# Patient Record
Sex: Male | Born: 1935 | Race: White | Hispanic: No | Marital: Married | Smoking: Former smoker
Health system: Southern US, Community
[De-identification: ages and names within clinical notes are randomized; demographics above are authoritative.]

## PROBLEM LIST (undated history)

## (undated) DIAGNOSIS — K219 Gastro-esophageal reflux disease without esophagitis: Secondary | ICD-10-CM

## (undated) DIAGNOSIS — C801 Malignant (primary) neoplasm, unspecified: Secondary | ICD-10-CM

## (undated) DIAGNOSIS — E039 Hypothyroidism, unspecified: Secondary | ICD-10-CM

## (undated) DIAGNOSIS — I2699 Other pulmonary embolism without acute cor pulmonale: Secondary | ICD-10-CM

## (undated) DIAGNOSIS — M1990 Unspecified osteoarthritis, unspecified site: Secondary | ICD-10-CM

## (undated) DIAGNOSIS — R918 Other nonspecific abnormal finding of lung field: Secondary | ICD-10-CM

## (undated) HISTORY — PX: CARDIAC CATHETERIZATION: SHX172

## (undated) HISTORY — PX: WISDOM TOOTH EXTRACTION: SHX21

## (undated) HISTORY — PX: CERVICAL FUSION: SHX112

## (undated) HISTORY — PX: TONSILLECTOMY: SUR1361

## (undated) HISTORY — DX: Other pulmonary embolism without acute cor pulmonale: I26.99

## (undated) HISTORY — DX: Malignant (primary) neoplasm, unspecified: C80.1

## (undated) HISTORY — PX: COLONOSCOPY: SHX174

---

## 1969-02-13 HISTORY — PX: THYROIDECTOMY: SHX17

## 1997-09-24 ENCOUNTER — Ambulatory Visit (HOSPITAL_COMMUNITY): Admission: RE | Admit: 1997-09-24 | Discharge: 1997-09-24 | Payer: Self-pay | Admitting: Internal Medicine

## 1998-03-02 ENCOUNTER — Encounter: Payer: Self-pay | Admitting: Neurosurgery

## 1998-03-02 ENCOUNTER — Inpatient Hospital Stay (HOSPITAL_COMMUNITY): Admission: AD | Admit: 1998-03-02 | Discharge: 1998-03-04 | Payer: Self-pay | Admitting: Neurosurgery

## 1998-03-25 ENCOUNTER — Ambulatory Visit (HOSPITAL_COMMUNITY): Admission: RE | Admit: 1998-03-25 | Discharge: 1998-03-25 | Payer: Self-pay | Admitting: Neurosurgery

## 1998-03-25 ENCOUNTER — Encounter: Payer: Self-pay | Admitting: Neurosurgery

## 1998-03-27 ENCOUNTER — Ambulatory Visit (HOSPITAL_COMMUNITY): Admission: RE | Admit: 1998-03-27 | Discharge: 1998-03-27 | Payer: Self-pay | Admitting: Neurosurgery

## 1998-03-27 ENCOUNTER — Encounter: Payer: Self-pay | Admitting: Neurosurgery

## 1998-04-20 ENCOUNTER — Encounter: Payer: Self-pay | Admitting: Neurosurgery

## 1998-04-20 ENCOUNTER — Inpatient Hospital Stay (HOSPITAL_COMMUNITY): Admission: RE | Admit: 1998-04-20 | Discharge: 1998-04-22 | Payer: Self-pay | Admitting: Neurosurgery

## 2001-02-13 DIAGNOSIS — C801 Malignant (primary) neoplasm, unspecified: Secondary | ICD-10-CM

## 2001-02-13 HISTORY — PX: MELANOMA EXCISION: SHX5266

## 2001-02-13 HISTORY — DX: Malignant (primary) neoplasm, unspecified: C80.1

## 2001-04-09 ENCOUNTER — Encounter (INDEPENDENT_AMBULATORY_CARE_PROVIDER_SITE_OTHER): Payer: Self-pay | Admitting: Specialist

## 2001-04-09 ENCOUNTER — Ambulatory Visit (HOSPITAL_BASED_OUTPATIENT_CLINIC_OR_DEPARTMENT_OTHER): Admission: RE | Admit: 2001-04-09 | Discharge: 2001-04-09 | Payer: Self-pay | Admitting: Plastic Surgery

## 2001-07-29 ENCOUNTER — Ambulatory Visit: Admission: RE | Admit: 2001-07-29 | Discharge: 2001-10-27 | Payer: Self-pay | Admitting: Radiation Oncology

## 2002-02-13 HISTORY — PX: TOTAL KNEE ARTHROPLASTY: SHX125

## 2002-08-19 ENCOUNTER — Ambulatory Visit (HOSPITAL_COMMUNITY): Admission: RE | Admit: 2002-08-19 | Discharge: 2002-08-19 | Payer: Self-pay | Admitting: Radiology

## 2002-08-19 ENCOUNTER — Encounter: Payer: Self-pay | Admitting: Radiology

## 2002-08-20 ENCOUNTER — Encounter: Payer: Self-pay | Admitting: Radiology

## 2002-08-20 ENCOUNTER — Encounter: Admission: RE | Admit: 2002-08-20 | Discharge: 2002-08-20 | Payer: Self-pay | Admitting: Radiology

## 2002-09-15 ENCOUNTER — Encounter: Admission: RE | Admit: 2002-09-15 | Discharge: 2002-09-15 | Payer: Self-pay | Admitting: Family Medicine

## 2002-09-15 ENCOUNTER — Encounter: Payer: Self-pay | Admitting: Family Medicine

## 2002-09-29 ENCOUNTER — Encounter: Payer: Self-pay | Admitting: Orthopedic Surgery

## 2002-09-29 ENCOUNTER — Ambulatory Visit (HOSPITAL_BASED_OUTPATIENT_CLINIC_OR_DEPARTMENT_OTHER): Admission: RE | Admit: 2002-09-29 | Discharge: 2002-09-29 | Payer: Self-pay | Admitting: Orthopedic Surgery

## 2002-09-29 ENCOUNTER — Encounter: Admission: RE | Admit: 2002-09-29 | Discharge: 2002-09-29 | Payer: Self-pay | Admitting: Orthopedic Surgery

## 2002-10-28 ENCOUNTER — Encounter: Admission: RE | Admit: 2002-10-28 | Discharge: 2002-10-28 | Payer: Self-pay | Admitting: *Deleted

## 2002-10-28 ENCOUNTER — Encounter: Payer: Self-pay | Admitting: Orthopedic Surgery

## 2003-01-22 ENCOUNTER — Ambulatory Visit (HOSPITAL_COMMUNITY): Admission: RE | Admit: 2003-01-22 | Discharge: 2003-01-22 | Payer: Self-pay | Admitting: Orthopedic Surgery

## 2003-02-11 ENCOUNTER — Inpatient Hospital Stay (HOSPITAL_COMMUNITY): Admission: RE | Admit: 2003-02-11 | Discharge: 2003-02-16 | Payer: Self-pay | Admitting: Orthopedic Surgery

## 2003-04-11 ENCOUNTER — Emergency Department (HOSPITAL_COMMUNITY): Admission: EM | Admit: 2003-04-11 | Discharge: 2003-04-11 | Payer: Self-pay | Admitting: Emergency Medicine

## 2003-06-19 ENCOUNTER — Encounter: Admission: RE | Admit: 2003-06-19 | Discharge: 2003-06-19 | Payer: Self-pay | Admitting: Critical Care Medicine

## 2003-06-19 ENCOUNTER — Inpatient Hospital Stay (HOSPITAL_COMMUNITY): Admission: AD | Admit: 2003-06-19 | Discharge: 2003-06-23 | Payer: Self-pay | Admitting: Critical Care Medicine

## 2003-06-22 ENCOUNTER — Encounter: Payer: Self-pay | Admitting: Cardiology

## 2003-07-27 ENCOUNTER — Encounter: Admission: RE | Admit: 2003-07-27 | Discharge: 2003-07-27 | Payer: Self-pay | Admitting: Critical Care Medicine

## 2005-02-10 ENCOUNTER — Ambulatory Visit: Payer: Self-pay | Admitting: Internal Medicine

## 2005-04-05 ENCOUNTER — Ambulatory Visit: Payer: Self-pay | Admitting: Critical Care Medicine

## 2005-04-05 ENCOUNTER — Inpatient Hospital Stay (HOSPITAL_COMMUNITY): Admission: EM | Admit: 2005-04-05 | Discharge: 2005-04-07 | Payer: Self-pay | Admitting: Emergency Medicine

## 2005-04-10 ENCOUNTER — Ambulatory Visit: Payer: Self-pay | Admitting: Internal Medicine

## 2005-04-13 ENCOUNTER — Ambulatory Visit: Payer: Self-pay | Admitting: Critical Care Medicine

## 2005-04-14 ENCOUNTER — Ambulatory Visit: Payer: Self-pay | Admitting: Cardiology

## 2005-04-24 ENCOUNTER — Ambulatory Visit: Payer: Self-pay | Admitting: Cardiology

## 2005-04-26 ENCOUNTER — Ambulatory Visit: Payer: Self-pay | Admitting: Cardiology

## 2005-04-26 ENCOUNTER — Ambulatory Visit: Payer: Self-pay

## 2005-05-15 ENCOUNTER — Ambulatory Visit: Payer: Self-pay | Admitting: Cardiology

## 2005-05-25 ENCOUNTER — Ambulatory Visit: Payer: Self-pay | Admitting: Critical Care Medicine

## 2005-05-29 ENCOUNTER — Ambulatory Visit: Payer: Self-pay | Admitting: Internal Medicine

## 2005-06-13 ENCOUNTER — Ambulatory Visit: Payer: Self-pay | Admitting: Cardiology

## 2005-06-27 ENCOUNTER — Ambulatory Visit: Payer: Self-pay | Admitting: Cardiology

## 2005-07-28 ENCOUNTER — Ambulatory Visit: Payer: Self-pay | Admitting: Cardiology

## 2005-08-24 ENCOUNTER — Ambulatory Visit: Payer: Self-pay | Admitting: Cardiology

## 2005-09-21 ENCOUNTER — Ambulatory Visit: Payer: Self-pay | Admitting: Cardiology

## 2005-10-19 ENCOUNTER — Ambulatory Visit: Payer: Self-pay | Admitting: Cardiology

## 2005-11-14 ENCOUNTER — Ambulatory Visit: Payer: Self-pay | Admitting: Critical Care Medicine

## 2005-11-17 ENCOUNTER — Ambulatory Visit: Payer: Self-pay | Admitting: Cardiovascular Disease

## 2005-12-19 ENCOUNTER — Ambulatory Visit: Payer: Self-pay | Admitting: *Deleted

## 2006-01-16 ENCOUNTER — Ambulatory Visit: Payer: Self-pay | Admitting: Cardiology

## 2006-01-30 ENCOUNTER — Ambulatory Visit: Payer: Self-pay | Admitting: Cardiology

## 2006-02-27 ENCOUNTER — Ambulatory Visit: Payer: Self-pay | Admitting: Cardiology

## 2006-03-14 ENCOUNTER — Ambulatory Visit: Payer: Self-pay | Admitting: Internal Medicine

## 2006-03-27 ENCOUNTER — Ambulatory Visit: Payer: Self-pay | Admitting: Cardiology

## 2006-04-24 ENCOUNTER — Ambulatory Visit: Payer: Self-pay | Admitting: Cardiology

## 2006-05-07 ENCOUNTER — Ambulatory Visit: Payer: Self-pay | Admitting: Cardiology

## 2006-05-28 ENCOUNTER — Ambulatory Visit: Payer: Self-pay | Admitting: Cardiology

## 2006-06-18 ENCOUNTER — Ambulatory Visit: Payer: Self-pay | Admitting: Cardiovascular Disease

## 2006-07-16 ENCOUNTER — Ambulatory Visit: Payer: Self-pay | Admitting: Cardiovascular Disease

## 2006-08-13 ENCOUNTER — Ambulatory Visit: Payer: Self-pay | Admitting: Cardiology

## 2006-09-10 ENCOUNTER — Ambulatory Visit: Payer: Self-pay | Admitting: Internal Medicine

## 2006-09-24 ENCOUNTER — Ambulatory Visit: Payer: Self-pay | Admitting: Internal Medicine

## 2006-10-08 ENCOUNTER — Ambulatory Visit: Payer: Self-pay | Admitting: Cardiology

## 2006-11-05 ENCOUNTER — Ambulatory Visit: Payer: Self-pay | Admitting: Internal Medicine

## 2006-11-08 ENCOUNTER — Telehealth: Payer: Self-pay | Admitting: Internal Medicine

## 2006-11-09 ENCOUNTER — Ambulatory Visit: Payer: Self-pay | Admitting: Internal Medicine

## 2006-11-09 DIAGNOSIS — Z86718 Personal history of other venous thrombosis and embolism: Secondary | ICD-10-CM | POA: Insufficient documentation

## 2006-11-09 LAB — CONVERTED CEMR LAB
Basophils Absolute: 0 10*3/uL (ref 0.0–0.1)
Basophils Relative: 0.2 % (ref 0.0–1.0)
Eosinophils Absolute: 0.2 10*3/uL (ref 0.0–0.6)
Eosinophils Relative: 1.5 % (ref 0.0–5.0)
HCT: 46.8 % (ref 39.0–52.0)
Hemoglobin: 15.5 g/dL (ref 13.0–17.0)
INR: 2.5 — ABNORMAL HIGH (ref 0.8–1.0)
Lymphocytes Relative: 9.1 % — ABNORMAL LOW (ref 12.0–46.0)
MCHC: 33.1 g/dL (ref 30.0–36.0)
MCV: 86.3 fL (ref 78.0–100.0)
Monocytes Absolute: 0.7 10*3/uL (ref 0.2–0.7)
Monocytes Relative: 6 % (ref 3.0–11.0)
Neutro Abs: 9.7 10*3/uL — ABNORMAL HIGH (ref 1.4–7.7)
Neutrophils Relative %: 83.2 % — ABNORMAL HIGH (ref 43.0–77.0)
Platelets: 184 10*3/uL (ref 150–400)
Prothrombin Time: 19.8 s — ABNORMAL HIGH (ref 10.9–13.3)
RBC: 5.42 M/uL (ref 4.22–5.81)
RDW: 12.3 % (ref 11.5–14.6)
WBC: 11.7 10*3/uL — ABNORMAL HIGH (ref 4.5–10.5)

## 2006-12-03 ENCOUNTER — Ambulatory Visit: Payer: Self-pay | Admitting: Internal Medicine

## 2006-12-31 ENCOUNTER — Ambulatory Visit: Payer: Self-pay | Admitting: Cardiology

## 2007-01-29 ENCOUNTER — Ambulatory Visit: Payer: Self-pay | Admitting: Internal Medicine

## 2007-02-12 ENCOUNTER — Ambulatory Visit: Payer: Self-pay | Admitting: Internal Medicine

## 2007-02-15 ENCOUNTER — Encounter: Admission: RE | Admit: 2007-02-15 | Discharge: 2007-02-15 | Payer: Self-pay | Admitting: Radiology

## 2007-02-15 ENCOUNTER — Ambulatory Visit (HOSPITAL_COMMUNITY): Admission: RE | Admit: 2007-02-15 | Discharge: 2007-02-15 | Payer: Self-pay | Admitting: Neurosurgery

## 2007-02-15 ENCOUNTER — Ambulatory Visit: Payer: Self-pay | Admitting: Internal Medicine

## 2007-02-15 DIAGNOSIS — M5020 Other cervical disc displacement, unspecified cervical region: Secondary | ICD-10-CM | POA: Insufficient documentation

## 2007-02-18 ENCOUNTER — Ambulatory Visit: Payer: Self-pay | Admitting: Internal Medicine

## 2007-02-19 ENCOUNTER — Ambulatory Visit: Payer: Self-pay | Admitting: Cardiology

## 2007-02-19 ENCOUNTER — Inpatient Hospital Stay (HOSPITAL_COMMUNITY): Admission: AD | Admit: 2007-02-19 | Discharge: 2007-02-22 | Payer: Self-pay | Admitting: Neurosurgery

## 2007-02-26 ENCOUNTER — Ambulatory Visit: Payer: Self-pay | Admitting: Cardiovascular Disease

## 2007-02-27 ENCOUNTER — Ambulatory Visit: Payer: Self-pay | Admitting: Cardiovascular Disease

## 2007-02-28 ENCOUNTER — Ambulatory Visit: Payer: Self-pay | Admitting: Internal Medicine

## 2007-03-01 ENCOUNTER — Ambulatory Visit: Payer: Self-pay | Admitting: Cardiology

## 2007-03-04 ENCOUNTER — Encounter: Payer: Self-pay | Admitting: Internal Medicine

## 2007-03-06 ENCOUNTER — Ambulatory Visit: Payer: Self-pay | Admitting: Cardiovascular Disease

## 2007-03-12 ENCOUNTER — Ambulatory Visit: Payer: Self-pay | Admitting: Cardiovascular Disease

## 2007-03-26 ENCOUNTER — Ambulatory Visit: Payer: Self-pay | Admitting: Cardiovascular Disease

## 2007-04-16 ENCOUNTER — Ambulatory Visit: Payer: Self-pay | Admitting: Internal Medicine

## 2007-04-16 ENCOUNTER — Encounter: Payer: Self-pay | Admitting: Internal Medicine

## 2007-05-13 ENCOUNTER — Encounter: Payer: Self-pay | Admitting: Internal Medicine

## 2007-05-14 ENCOUNTER — Ambulatory Visit: Payer: Self-pay | Admitting: Internal Medicine

## 2007-06-04 ENCOUNTER — Ambulatory Visit: Payer: Self-pay | Admitting: Cardiology

## 2007-06-24 ENCOUNTER — Ambulatory Visit: Payer: Self-pay | Admitting: Cardiology

## 2007-07-15 ENCOUNTER — Ambulatory Visit: Payer: Self-pay | Admitting: Cardiovascular Disease

## 2007-08-12 ENCOUNTER — Ambulatory Visit: Payer: Self-pay | Admitting: Cardiology

## 2007-08-29 ENCOUNTER — Encounter: Admission: RE | Admit: 2007-08-29 | Discharge: 2007-08-29 | Payer: Self-pay | Admitting: Orthopedic Surgery

## 2007-09-09 ENCOUNTER — Ambulatory Visit: Payer: Self-pay | Admitting: Cardiovascular Disease

## 2007-10-07 ENCOUNTER — Ambulatory Visit: Payer: Self-pay | Admitting: Internal Medicine

## 2007-11-04 ENCOUNTER — Ambulatory Visit: Payer: Self-pay

## 2007-12-02 ENCOUNTER — Ambulatory Visit: Payer: Self-pay | Admitting: Internal Medicine

## 2007-12-16 ENCOUNTER — Encounter: Payer: Self-pay | Admitting: Internal Medicine

## 2007-12-30 ENCOUNTER — Ambulatory Visit: Payer: Self-pay | Admitting: Cardiology

## 2008-01-13 ENCOUNTER — Ambulatory Visit: Payer: Self-pay | Admitting: Internal Medicine

## 2008-01-13 DIAGNOSIS — E890 Postprocedural hypothyroidism: Secondary | ICD-10-CM | POA: Insufficient documentation

## 2008-01-13 DIAGNOSIS — C439 Malignant melanoma of skin, unspecified: Secondary | ICD-10-CM | POA: Insufficient documentation

## 2008-01-13 DIAGNOSIS — R972 Elevated prostate specific antigen [PSA]: Secondary | ICD-10-CM | POA: Insufficient documentation

## 2008-01-13 DIAGNOSIS — C4320 Malignant melanoma of unspecified ear and external auricular canal: Secondary | ICD-10-CM | POA: Insufficient documentation

## 2008-01-14 ENCOUNTER — Telehealth: Payer: Self-pay | Admitting: Internal Medicine

## 2008-01-15 ENCOUNTER — Telehealth (INDEPENDENT_AMBULATORY_CARE_PROVIDER_SITE_OTHER): Payer: Self-pay | Admitting: *Deleted

## 2008-01-27 ENCOUNTER — Ambulatory Visit: Payer: Self-pay | Admitting: Cardiology

## 2008-01-29 ENCOUNTER — Encounter: Payer: Self-pay | Admitting: Internal Medicine

## 2008-02-03 ENCOUNTER — Ambulatory Visit: Payer: Self-pay | Admitting: Cardiology

## 2008-02-13 ENCOUNTER — Ambulatory Visit: Payer: Self-pay | Admitting: Internal Medicine

## 2008-02-14 HISTORY — PX: LUMBAR LAMINECTOMY: SHX95

## 2008-03-02 ENCOUNTER — Ambulatory Visit: Payer: Self-pay | Admitting: Cardiology

## 2008-03-10 ENCOUNTER — Encounter: Payer: Self-pay | Admitting: Internal Medicine

## 2008-03-17 ENCOUNTER — Ambulatory Visit: Payer: Self-pay | Admitting: Cardiology

## 2008-04-08 ENCOUNTER — Ambulatory Visit: Payer: Self-pay | Admitting: Cardiology

## 2008-04-22 ENCOUNTER — Ambulatory Visit: Payer: Self-pay | Admitting: Cardiovascular Disease

## 2008-05-06 ENCOUNTER — Ambulatory Visit: Payer: Self-pay | Admitting: Internal Medicine

## 2008-05-25 ENCOUNTER — Ambulatory Visit: Payer: Self-pay | Admitting: Internal Medicine

## 2008-05-26 ENCOUNTER — Telehealth (INDEPENDENT_AMBULATORY_CARE_PROVIDER_SITE_OTHER): Payer: Self-pay | Admitting: *Deleted

## 2008-06-15 ENCOUNTER — Ambulatory Visit: Payer: Self-pay | Admitting: Cardiology

## 2008-07-14 ENCOUNTER — Ambulatory Visit: Payer: Self-pay | Admitting: Cardiology

## 2008-07-14 ENCOUNTER — Encounter: Payer: Self-pay | Admitting: *Deleted

## 2008-07-14 LAB — CONVERTED CEMR LAB
POC INR: 2.5
Protime: 19.2

## 2008-08-10 ENCOUNTER — Ambulatory Visit: Payer: Self-pay | Admitting: Cardiology

## 2008-08-10 ENCOUNTER — Encounter (INDEPENDENT_AMBULATORY_CARE_PROVIDER_SITE_OTHER): Payer: Self-pay | Admitting: *Deleted

## 2008-08-10 LAB — CONVERTED CEMR LAB
POC INR: 2.7
Prothrombin Time: 19.8 s

## 2008-08-19 ENCOUNTER — Encounter: Payer: Self-pay | Admitting: *Deleted

## 2008-09-07 ENCOUNTER — Ambulatory Visit: Payer: Self-pay | Admitting: Cardiology

## 2008-09-07 LAB — CONVERTED CEMR LAB
POC INR: 2.9
Prothrombin Time: 20.5 s

## 2008-10-05 ENCOUNTER — Ambulatory Visit: Payer: Self-pay | Admitting: Cardiovascular Disease

## 2008-10-05 LAB — CONVERTED CEMR LAB: POC INR: 2.3

## 2008-11-05 ENCOUNTER — Ambulatory Visit: Payer: Self-pay | Admitting: Internal Medicine

## 2008-11-05 LAB — CONVERTED CEMR LAB: POC INR: 3.3

## 2008-12-07 ENCOUNTER — Ambulatory Visit: Payer: Self-pay | Admitting: Cardiovascular Disease

## 2008-12-07 LAB — CONVERTED CEMR LAB: POC INR: 2.4

## 2009-01-04 ENCOUNTER — Ambulatory Visit: Payer: Self-pay | Admitting: Internal Medicine

## 2009-01-04 LAB — CONVERTED CEMR LAB: POC INR: 2.5

## 2009-02-01 ENCOUNTER — Ambulatory Visit: Payer: Self-pay | Admitting: Cardiology

## 2009-02-01 LAB — CONVERTED CEMR LAB: POC INR: 2.8

## 2009-03-01 ENCOUNTER — Ambulatory Visit: Payer: Self-pay | Admitting: Cardiology

## 2009-03-01 LAB — CONVERTED CEMR LAB: POC INR: 2.4

## 2009-03-29 ENCOUNTER — Ambulatory Visit: Payer: Self-pay | Admitting: Cardiology

## 2009-03-29 LAB — CONVERTED CEMR LAB: POC INR: 2.4

## 2009-04-26 ENCOUNTER — Ambulatory Visit: Payer: Self-pay | Admitting: Internal Medicine

## 2009-04-26 LAB — CONVERTED CEMR LAB: POC INR: 2.2

## 2009-05-24 ENCOUNTER — Ambulatory Visit: Payer: Self-pay | Admitting: Cardiovascular Disease

## 2009-05-24 LAB — CONVERTED CEMR LAB: POC INR: 1.7

## 2009-06-07 ENCOUNTER — Ambulatory Visit: Payer: Self-pay | Admitting: Cardiovascular Disease

## 2009-06-07 LAB — CONVERTED CEMR LAB: POC INR: 2.1

## 2009-07-05 ENCOUNTER — Ambulatory Visit: Payer: Self-pay | Admitting: Cardiology

## 2009-07-05 LAB — CONVERTED CEMR LAB: POC INR: 2

## 2009-08-02 ENCOUNTER — Ambulatory Visit: Payer: Self-pay | Admitting: Cardiology

## 2009-08-02 LAB — CONVERTED CEMR LAB: POC INR: 2

## 2009-08-30 ENCOUNTER — Ambulatory Visit: Payer: Self-pay | Admitting: Cardiology

## 2009-08-30 LAB — CONVERTED CEMR LAB: POC INR: 2.5

## 2009-09-23 ENCOUNTER — Encounter: Payer: Self-pay | Admitting: Internal Medicine

## 2009-09-27 ENCOUNTER — Ambulatory Visit: Payer: Self-pay | Admitting: Cardiology

## 2009-09-27 LAB — CONVERTED CEMR LAB: POC INR: 3.3

## 2009-10-11 ENCOUNTER — Ambulatory Visit: Payer: Self-pay | Admitting: Cardiology

## 2009-10-11 LAB — CONVERTED CEMR LAB: POC INR: 2.4

## 2009-10-13 ENCOUNTER — Encounter: Payer: Self-pay | Admitting: Internal Medicine

## 2009-10-20 ENCOUNTER — Telehealth (INDEPENDENT_AMBULATORY_CARE_PROVIDER_SITE_OTHER): Payer: Self-pay | Admitting: *Deleted

## 2009-11-05 ENCOUNTER — Ambulatory Visit: Payer: Self-pay | Admitting: Internal Medicine

## 2009-11-05 DIAGNOSIS — D6859 Other primary thrombophilia: Secondary | ICD-10-CM | POA: Insufficient documentation

## 2009-11-08 ENCOUNTER — Ambulatory Visit: Payer: Self-pay | Admitting: Cardiovascular Disease

## 2009-11-08 LAB — CONVERTED CEMR LAB: POC INR: 2.2

## 2009-12-06 ENCOUNTER — Ambulatory Visit: Payer: Self-pay | Admitting: Internal Medicine

## 2009-12-06 ENCOUNTER — Encounter (INDEPENDENT_AMBULATORY_CARE_PROVIDER_SITE_OTHER): Payer: Self-pay | Admitting: *Deleted

## 2009-12-06 ENCOUNTER — Ambulatory Visit: Payer: Self-pay | Admitting: Critical Care Medicine

## 2009-12-06 DIAGNOSIS — I82409 Acute embolism and thrombosis of unspecified deep veins of unspecified lower extremity: Secondary | ICD-10-CM | POA: Insufficient documentation

## 2009-12-07 ENCOUNTER — Ambulatory Visit: Payer: Self-pay

## 2009-12-07 ENCOUNTER — Encounter: Payer: Self-pay | Admitting: Critical Care Medicine

## 2009-12-14 ENCOUNTER — Encounter: Payer: Self-pay | Admitting: Critical Care Medicine

## 2010-01-03 ENCOUNTER — Ambulatory Visit: Payer: Self-pay | Admitting: Cardiovascular Disease

## 2010-01-31 ENCOUNTER — Ambulatory Visit: Payer: Self-pay | Admitting: Internal Medicine

## 2010-02-28 ENCOUNTER — Ambulatory Visit: Admission: RE | Admit: 2010-02-28 | Discharge: 2010-02-28 | Payer: Self-pay | Source: Home / Self Care

## 2010-02-28 LAB — CONVERTED CEMR LAB: POC INR: 2.3

## 2010-03-05 ENCOUNTER — Encounter: Payer: Self-pay | Admitting: Orthopedic Surgery

## 2010-03-17 NOTE — Medication Information (Signed)
Summary: rov/mlw  Anticoagulant Therapy  Managed by: Bethena Midget, RN, BSN Referring MD: Shan Levans Supervising MD: Excell Seltzer MD, Casimiro Needle Indication 1: Pulmonary embolus (ICD-415.19) Indication 2: Deep Venous Thrombosis (ICD-451.19) Lab Used: [facility] Site: [facility] INR POC 2.1 INR RANGE 2 - 3  Dietary changes: no    Health status changes: no    Bleeding/hemorrhagic complications: no    Recent/future hospitalizations: no    Any changes in medication regimen? no    Recent/future dental: no  Any missed doses?: no       Is patient compliant with meds? yes       Allergies: No Known Drug Allergies  Anticoagulation Management History:      The patient is taking warfarin and comes in today for a routine follow up visit.  Positive risk factors for bleeding include an age of 75 years or older.  The bleeding index is 'intermediate risk'.  Positive CHADS2 values include Age > 62 years old.  The start date was 06/19/2003.  His last INR was 2.5 RATIO.  Anticoagulation responsible provider: Excell Seltzer MD, Casimiro Needle.  INR POC: 2.1.  Cuvette Lot#: [id number].  Exp: 07/2010.    Anticoagulation Management Assessment/Plan:      The patient's current anticoagulation dose is Coumadin 5 mg tabs: Take as directed by coumadin clinic..  The target INR is 2 - 3.  The next INR is due 07/05/2009.  Anticoagulation instructions were given to patient.  Results were reviewed/authorized by Bethena Midget, RN, BSN.  He was notified by Bethena Midget, RN, BSN.         Prior Anticoagulation Instructions: INR 1.8  Take an extra 0.5 tablet today. Then resume same dose of 1 tablet daily, except an extra 0.5 tablet (total 1.5 tablet) on Monday, April 18th.   Next INR check on Monday, April 25th at 3:00 pm.    Current Anticoagulation Instructions: INR 2.1 Continue 5mg s daily. Recheck in 4 weeks.

## 2010-03-17 NOTE — Medication Information (Signed)
Summary: rov/nb  Anticoagulant Therapy  Managed by: Bethena Midget, RN, BSN Referring MD: Shan Levans Supervising MD: Tenny Craw MD, Gunnar Fusi Indication 1: Pulmonary embolus (ICD-415.19) Indication 2: Deep Venous Thrombosis (ICD-451.19) Lab Used: [facility] Site: [facility] INR POC 2.5 INR RANGE 2 - 3  Dietary changes: no    Health status changes: no    Bleeding/hemorrhagic complications: no    Recent/future hospitalizations: no    Any changes in medication regimen? no    Recent/future dental: no  Any missed doses?: no       Is patient compliant with meds? yes       Allergies: No Known Drug Allergies  Anticoagulation Management History:      The patient is taking warfarin and comes in today for a routine follow up visit.  Positive risk factors for bleeding include an age of 75 years or older.  The bleeding index is 'intermediate risk'.  Positive CHADS2 values include Age > 27 years old.  The start date was 06/19/2003.  His last INR was 2.5 RATIO.  Anticoagulation responsible provider: Tenny Craw MD, Gunnar Fusi.  INR POC: 2.5.  Cuvette Lot#: [id number].  Exp: 01/2011.    Anticoagulation Management Assessment/Plan:      The patient's current anticoagulation dose is Coumadin 5 mg tabs: Take as directed by coumadin clinic..  The target INR is 2 - 3.  The next INR is due 02/28/2010.  Anticoagulation instructions were given to patient.  Results were reviewed/authorized by Bethena Midget, RN, BSN.  He was notified by Bethena Midget, RN, BSN.         Prior Anticoagulation Instructions: INR 2.2 Continue previous dose of 1 tablet everyday except 1.5 tablet on Thursday Recheck INR in 4 weeks   Current Anticoagulation Instructions: INR 2.5 Continue 5mg s daily except 7.5mg s on Thursdays. Recheck in 4 weeks.

## 2010-03-17 NOTE — Medication Information (Signed)
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 yes       Details: Drug similar to flomax  Recent/future dental: no  Any missed doses?: yes     Details: 1 1/2 tabs mon and thurs  Is patient compliant with meds? yes       Current Medications (verified): 1)  Coumadin Clinic 2)  Armour Thyroid 120 Mg  Tabs (Thyroid) .Marland Kitchen.. 1 By Mouth Once Daily Except 2 Tabs Mon, Wed, & Fri 3)  Vit D 1000mg  Qd 4)  Coumadin 5 Mg Tabs (Warfarin Sodium) .... Take As Directed By Coumadin Clinic.  Allergies (verified): No Known Drug Allergies  Anticoagulation Management History:      The patient is taking warfarin and comes in today for a routine follow up visit.  Positive risk factors for bleeding include an age of 75 years or older.  The bleeding index is 'intermediate risk'.  Positive CHADS2 values include Age > 61 years old.  The start date was 06/19/2003.  His last INR was 2.5 RATIO.  Anticoagulation responsible Juliannah Ohmann: Shirlee Latch MD, Dalton.  INR POC: 3.3.  Cuvette Lot#: [id number].  Exp: 10/2010.    Anticoagulation Management Assessment/Plan:      The patient's current anticoagulation dose is Coumadin 5 mg tabs: Take as directed by coumadin clinic..  The target INR is 2 - 3.  The next INR is due 10/11/2009.  Anticoagulation instructions were given to patient.  Results were reviewed/authorized by Samantha Crimes, PharmD.  He was notified by Samantha Crimes PharmD.         Prior Anticoagulation Instructions: INR 2.5  Continue on same dosage 1 tablet daily  except 1.5 tablets on Mondays and Thursdays.  Recheck in 4 weeks.    Current Anticoagulation Instructions: INR 3.3  Hold Tuesdays dose  Take 1 tab everyday except for Thursday when you take 1.5 tabs  Follow-up in 2 weeks

## 2010-03-17 NOTE — Medication Information (Signed)
Summary: rov/eac  Anticoagulant Therapy  Managed by: Cloyde Reams, RN, BSN Referring MD: Shan Levans Supervising MD: Shirlee Latch MD, Mairin Lindsley Indication 1: Pulmonary embolus (ICD-415.19) Indication 2: Deep Venous Thrombosis (ICD-451.19) Lab Used: [facility] Site: [facility] INR POC 2.0 INR RANGE 2 - 3  Dietary changes: no    Health status changes: no    Bleeding/hemorrhagic complications: no    Recent/future hospitalizations: no    Any changes in medication regimen? no    Recent/future dental: no  Any missed doses?: no       Is patient compliant with meds? yes       Allergies: No Known Drug Allergies  Anticoagulation Management History:      The patient is taking warfarin and comes in today for a routine follow up visit.  Positive risk factors for bleeding include an age of 75 years or older.  The bleeding index is 'intermediate risk'.  Positive CHADS2 values include Age > 75 years old.  The start date was 06/19/2003.  His last INR was 2.5 RATIO.  Anticoagulation responsible provider: Shirlee Latch MD, Deona Novitski.  INR POC: 2.0.  Cuvette Lot#: [id number].  Exp: 10/2010.    Anticoagulation Management Assessment/Plan:      The patient's current anticoagulation dose is Coumadin 5 mg tabs: Take as directed by coumadin clinic..  The target INR is 2 - 3.  The next INR is due 08/30/2009.  Anticoagulation instructions were given to patient.  Results were reviewed/authorized by Cloyde Reams, RN, BSN.  He was notified by Cloyde Reams RN.         Prior Anticoagulation Instructions: INR 2.0  Take an extra 1/2 tablets today.  Then resume normal dosing schedule of 1 tablet every day.  Return to clinic in 4 weeks.   Current Anticoagulation Instructions: INR 2.0  Start taking 1 tablet daily except 1.5 tablets on Mondays. Recheck in 4 weeks.

## 2010-03-17 NOTE — Medication Information (Signed)
Summary: rov/mw  Anticoagulant Therapy  Managed by: Cloyde Reams, RN, BSN Referring MD: Shan Levans Supervising MD: Johney Frame MD, Fayrene Fearing Indication 1: Pulmonary embolus (ICD-415.19) Indication 2: Deep Venous Thrombosis (ICD-451.19) Lab Used: [facility] Site: [facility] INR POC 2.5 INR RANGE 2 - 3  Dietary changes: no    Health status changes: no    Bleeding/hemorrhagic complications: no    Recent/future hospitalizations: no    Any changes in medication regimen? no    Recent/future dental: no  Any missed doses?: no       Is patient compliant with meds? yes       Allergies: No Known Drug Allergies  Anticoagulation Management History:      The patient is taking warfarin and comes in today for a routine follow up visit.  Positive risk factors for bleeding include an age of 75 years or older.  The bleeding index is 'intermediate risk'.  Positive CHADS2 values include Age > 41 years old.  The start date was 06/19/2003.  His last INR was 2.5 RATIO.  Anticoagulation responsible provider: Saket Hellstrom MD, Fayrene Fearing.  INR POC: 2.5.  Cuvette Lot#: [id number].  Exp: 01/2011.    Anticoagulation Management Assessment/Plan:      The patient's current anticoagulation dose is Coumadin 5 mg tabs: Take as directed by coumadin clinic..  The target INR is 2 - 3.  The next INR is due 01/03/2010.  Anticoagulation instructions were given to patient.  Results were reviewed/authorized by Cloyde Reams, RN, BSN.  He was notified by Cloyde Reams RN.         Prior Anticoagulation Instructions: INR 2.2 No changes today. Continue taking 1 tablet everyday except thursday. On thursdays take 1.5 tablets. See you in 4 weeks.  Current Anticoagulation Instructions: INR 2.5  Continue on same dosage 1 tablet daily except 1.5 tablets on Thursdays.  Recheck in 4 weeks.

## 2010-03-17 NOTE — Medication Information (Signed)
Summary: rov/ez  Anticoagulant Therapy  Managed by: Eda Keys, PharmD Referring MD: Shan Levans Supervising MD: Shirlee Latch MD, Nikolus Marczak Indication 1: Pulmonary embolus (ICD-415.19) Indication 2: Deep Venous Thrombosis (ICD-451.19) Lab Used: [facility] Site: [facility] INR POC 2.4 INR RANGE 2 - 3  Dietary changes: no    Health status changes: no    Bleeding/hemorrhagic complications: no    Recent/future hospitalizations: no    Any changes in medication regimen? no    Recent/future dental: no  Any missed doses?: no       Is patient compliant with meds? yes       Allergies: No Known Drug Allergies  Anticoagulation Management History:      The patient is taking warfarin and comes in today for a routine follow up visit.  Positive risk factors for bleeding include an age of 75 years or older.  The bleeding index is 'intermediate risk'.  Positive CHADS2 values include Age > 72 years old.  The start date was 06/19/2003.  His last INR was 2.5 RATIO.  Anticoagulation responsible provider: Shirlee Latch MD, Courtni Balash.  INR POC: 2.4.  Cuvette Lot#: [id number].  Exp: 05/2010.    Anticoagulation Management Assessment/Plan:      The patient's current anticoagulation dose is Coumadin 5 mg tabs: Take as directed by coumadin clinic..  The target INR is 2 - 3.  The next INR is due 04/26/2009.  Anticoagulation instructions were given to patient.  Results were reviewed/authorized by Eda Keys, PharmD.  He was notified by Eda Keys.         Prior Anticoagulation Instructions: INR: 2.4 Continue with same dosage of 5mg  tablet daily Recheck in 4 weeks  Current Anticoagulation Instructions: INR 2.4  Continue current dosing schedule.  Take 1 tablet (5 mg) daily. return to clinic in 4 weeks.

## 2010-03-17 NOTE — Medication Information (Signed)
Summary: rov/eac      Allergies Added: NKDA Anticoagulant Therapy  Managed by: Bethanne Ginger, PharmD Referring MD: Shan Levans Supervising MD: Tenny Craw MD, Gunnar Fusi Indication 1: Pulmonary embolus (ICD-415.19) Indication 2: Deep Venous Thrombosis (ICD-451.19) Lab Used: [facility] Site: [facility] INR POC 2.2 INR RANGE 2 - 3  Dietary changes: no    Health status changes: no    Bleeding/hemorrhagic complications: no    Recent/future hospitalizations: no    Any changes in medication regimen? no    Recent/future dental: no  Any missed doses?: no       Is patient compliant with meds? yes       Current Medications (verified): 1)  Coumadin Clinic 2)  Armour Thyroid 120 Mg  Tabs (Thyroid) .Marland Kitchen.. 1 By Mouth Once Daily Except 2 Tabs Mon, Wed, & Fri 3)  Vit D 1000mg  Qd 4)  Coumadin 5 Mg Tabs (Warfarin Sodium) .... Take As Directed By Coumadin Clinic.  Allergies (verified): No Known Drug Allergies  Anticoagulation Management History:      The patient is taking warfarin and comes in today for a routine follow up visit.  Positive risk factors for bleeding include an age of 75 years or older.  The bleeding index is 'intermediate risk'.  Positive CHADS2 values include Age > 16 years old.  The start date was 06/19/2003.  His last INR was 2.5 RATIO.  Anticoagulation responsible provider: Tenny Craw MD, Gunnar Fusi.  INR POC: 2.2.  Cuvette Lot#: [id number].  Exp: 05/2010.    Anticoagulation Management Assessment/Plan:      The patient's current anticoagulation dose is Coumadin 5 mg tabs: Take as directed by coumadin clinic..  The target INR is 2 - 3.  The next INR is due 05/24/2009.  Anticoagulation instructions were given to patient.  Results were reviewed/authorized by Bethanne Ginger, PharmD.  He was notified by Bethanne Ginger.         Prior Anticoagulation Instructions: INR 2.4  Continue current dosing schedule.  Take 1 tablet (5 mg) daily. return to clinic in 4 weeks.  Current Anticoagulation  Instructions: INR = 2.2  Take 1 tablet (5mg ) daily.

## 2010-03-17 NOTE — Medication Information (Signed)
Summary: rov/td      Allergies Added: NKDA Anticoagulant Therapy  Managed by: Lynann Bologna, PharmD Referring MD: Shan Levans Supervising MD: Clifton James MD, Cristal Deer Indication 1: Pulmonary embolus (ICD-415.19) Indication 2: Deep Venous Thrombosis (ICD-451.19) Lab Used: [facility] Site: [facility] INR POC 1.7 INR RANGE 2 - 3  Dietary changes: yes       Details: Been on diet for lindt. Has lost 10-12 pounds. Has not eaten any sugar or any flour. Will go off diet on Friday, April 22nd.   Health status changes: no    Bleeding/hemorrhagic complications: no    Recent/future hospitalizations: no    Any changes in medication regimen? no    Recent/future dental: no  Any missed doses?: no       Is patient compliant with meds? yes       Current Medications (verified): 1)  Coumadin Clinic 2)  Armour Thyroid 120 Mg  Tabs (Thyroid) .Marland Kitchen.. 1 By Mouth Once Daily Except 2 Tabs Mon, Wed, & Fri 3)  Vit D 1000mg  Qd 4)  Coumadin 5 Mg Tabs (Warfarin Sodium) .... Take As Directed By Coumadin Clinic.  Allergies (verified): No Known Drug Allergies  Anticoagulation Management History:      The patient is taking warfarin and comes in today for a routine follow up visit.  Positive risk factors for bleeding include an age of 75 years or older.  The bleeding index is 'intermediate risk'.  Positive CHADS2 values include Age > 62 years old.  The start date was 06/19/2003.  His last INR was 2.5 RATIO.  Anticoagulation responsible provider: Clifton James MD, Cristal Deer.  INR POC: 1.7.  Cuvette Lot#: [id number].  Exp: 06/2010.    Anticoagulation Management Assessment/Plan:      The patient's current anticoagulation dose is Coumadin 5 mg tabs: Take as directed by coumadin clinic..  The target INR is 2 - 3.  The next INR is due 06/07/2009.  Anticoagulation instructions were given to patient.  Results were reviewed/authorized by Lynann Bologna, PharmD.  He was notified by Lynann Bologna.         Prior  Anticoagulation Instructions: INR = 2.2  Take 1 tablet (5mg ) daily.  Current Anticoagulation Instructions: INR 1.8  Take an extra 0.5 tablet today. Then resume same dose of 1 tablet daily, except an extra 0.5 tablet (total 1.5 tablet) on Monday, April 18th.   Next INR check on Monday, April 25th at 3:00 pm.   Prescriptions: COUMADIN 5 MG TABS (WARFARIN SODIUM) Take as directed by coumadin clinic.  #40 Tablet x 3   Entered by:   Lynann Bologna   Authorized by:   Rollene Rotunda, MD, [facility]   Signed by:   Lynann Bologna on 05/24/2009   Method used:   Electronically to        [facility]  [address] [phone]* (retail)       [address]       [place], [place]  [address]       Ph: [phone] or [phone]       Fax: [phone]   RxID:   [id number] COUMADIN 5 MG TABS (WARFARIN SODIUM) Take as directed by coumadin clinic.  #40 Tablet x 2   Entered by:   Lynann Bologna   Authorized by:   Marland Kitchen [facility]   Signed by:   Lynann Bologna on 05/24/2009   Method used:   Print then Give to Patient   RxID:   [id number]

## 2010-03-17 NOTE — Medication Information (Signed)
Summary: rov/tm  Anticoagulant Therapy  Managed by: Eda Keys, PharmD Referring MD: Shan Levans Supervising MD: Jens Som MD, Arlys John Indication 1: Pulmonary embolus (ICD-415.19) Indication 2: Deep Venous Thrombosis (ICD-451.19) [facility]  Site: [facility] INR POC 2.0 INR RANGE 2 - 3  Dietary changes: no    Health status changes: no    Bleeding/hemorrhagic complications: no    Recent/future hospitalizations: no    Any changes in medication regimen? no    Recent/future dental: no  Any missed doses?: no       Is patient compliant with meds? yes       Allergies: No Known Drug Allergies  Anticoagulation Management History:      The patient is taking warfarin and comes in today for a routine follow up visit.  Positive risk factors for bleeding include an age of 75 years or older.  The bleeding index is 'intermediate risk'.  Positive CHADS2 values include Age > 60 years old.  The start date was 06/19/2003.  His last INR was 2.5 RATIO.  Anticoagulation responsible provider: Jens Som MD, Arlys John.  INR POC: 2.0.  Cuvette Lot#: [id number].  Exp: 09/2010.    Anticoagulation Management Assessment/Plan:      The patient's current anticoagulation dose is Coumadin 5 mg tabs: Take as directed by coumadin clinic..  The target INR is 2 - 3.  The next INR is due 08/02/2009.  Anticoagulation instructions were given to patient.  Results were reviewed/authorized by Eda Keys, PharmD.  He was notified by Eda Keys.         Prior Anticoagulation Instructions: INR 2.1 Continue 5mg s daily. Recheck in 4 weeks.   Current Anticoagulation Instructions: INR 2.0  Take an extra 1/2 tablets today.  Then resume normal dosing schedule of 1 tablet every day.  Return to clinic in 4 weeks.

## 2010-03-17 NOTE — Assessment & Plan Note (Signed)
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  December 06, 2009 10:20 AM Last seen Oct 2007 Once every 4 months, gets electrical shock in the L  leg and every 35 - 45 sec occurs until stops. This is in L leg. The leg does not swell. Pt is s/p TKR on L.  No calf pain.   Pain wears off by noon.  These symptoms   Happens every 4-5 months.    Pt remains on coumadin.  PT INR  2.5 and is ok.   Dosage is 5mg  every day 7.5mg  on thursdays  No diff with dyspnea.    No real cough No other new issues.   Preventive Screening-Counseling & Management  Alcohol-Tobacco     Alcohol drinks/day: 2 oz / day Vodka     Alcohol type: socially     Smoking Status: quit     Packs/Day: up to 3/4 ppd     Year Started: 1955     Year Quit: 1992  Current Medications (verified): 1)  Coumadin Clinic 2)  Armour Thyroid 120 Mg  Tabs (Thyroid) .Marland Kitchen.. 1 By Mouth Once Daily Except 2 Tabs Mon, Wed, & Fri 3)  Coumadin 5 Mg Tabs (Warfarin Sodium) .... Take As Directed By Coumadin Clinic. 4)  Flomax 0.4 Mg Caps (Tamsulosin Hcl) .... Take 1 Capsule By Mouth Once A Day  Allergies (verified): No Known Drug Allergies  Past History:  Past medical, surgical, family and social histories (including risk factors) reviewed, and no changes noted (except as noted below).  Past Medical History: Reviewed history from 11/05/2009 and no changes required. 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 Melanoma  ear 2003 Total knee replacement 2004 Thyroidectomy 1971 for nodule ,"? malignant" Cervical fusion  2000 Lumbar laminectomy 2000 Tonsillectomy Colonoscopy negative , Victorino Dike MD  Family History: Reviewed history from 11/05/2009 and no changes required. No FH melanoma or skin cancer Father: Alzheimer's Mother: breast cancer Siblings: sister : breast cancer  Social History: Reviewed history from 11/05/2009 and no changes required. Married Alcohol use-yes Regular exercise-yes Retired Engineer, technical sales Former smoker.  Quit in 1990's.  3/4 ppd x 40 yrs.    Review of Systems       The patient complains of joint stiffness or pain.  The patient denies shortness of breath with activity, shortness of breath at rest, productive cough, non-productive cough, coughing up blood, chest pain, irregular heartbeats, acid heartburn, indigestion, loss of appetite, weight change, abdominal pain, difficulty swallowing, sore throat, tooth/dental problems, headaches, nasal congestion/difficulty breathing through nose, sneezing, itching, ear ache, anxiety, depression, rash, change in color of mucus, and fever.    Vital Signs:  Patient profile:   77 year old male Height:      76 inches Weight:      221 pounds BMI:     27.00 O2 Sat:      96 % on Room air Temp:     97.6 degrees F oral Pulse rate:   67 / minute BP sitting:   124 / 86  (left arm) Cuff size:  regular  Vitals Entered By: Gweneth Dimitri RN (December 06, 2009 10:11 AM)  O2 Flow:  Room air CC: Pulmonary Consult - former pt - last seen 2007. Comments Medications reviewed with patient Daytime contact number verified with patient. Gweneth Dimitri RN  December 06, 2009 10:11 AM    Physical Exam  Additional Exam:  Gen: Pleasant, well-nourished, in no distress,  normal affect ENT: No lesions,  mouth clear,  oropharynx clear, no postnasal drip Neck: No JVD, no TMG, no carotid bruits Lungs: No use of accessory muscles, no dullness to percussion, clear without rales or rhonchi Cardiovascular: RRR, heart sounds normal, no murmur or gallops,  no peripheral edema, mild edema LLE.  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  I suspect due to vein incompetence and prior Total knee replacement the pt gets calf edema and pain from chronic venous stasis plan recheck venous doppler u/s LLE cont coumadin for life Orders: Est. Patient Level IV (16109) Vascular Clinic (Vascular)  Medications Added to Medication List This Visit: 1)  Flomax 0.4 Mg Caps (Tamsulosin hcl) .... Take 1 capsule by mouth once a day  Complete Medication List: 1)  Coumadin Clinic  2)  Armour Thyroid 120 Mg Tabs (Thyroid) .Marland Kitchen.. 1 by mouth once daily except 2 tabs mon, wed, & fri 3)  Coumadin 5 Mg Tabs (Warfarin sodium) .... Take as directed by coumadin clinic. 4)  Flomax 0.4 Mg Caps (Tamsulosin hcl) .... Take 1 capsule by mouth once a day  Patient Instructions: 1)  A venous doppler ultrasound of the Left Leg will be obtained 2)  I will call you with the results   Immunization History:  Influenza Immunization History:    Influenza:  historical (11/13/2009)

## 2010-03-17 NOTE — Assessment & Plan Note (Signed)
Summary: med refill/cbs   Vital Signs:  Patient profile:   77 year old male Height:      76 inches Weight:      216.6 pounds BMI:     26.46 Temp:     98.2 degrees F oral Pulse rate:   60 / minute Resp:     14 per minute BP sitting:   118 / 72  (left arm) Cuff size:   large  Vitals Entered By: Shonna Chock CMA (November 05, 2009 11:35 AM) CC: Yearly follow-up (not fasting), patient states he had an EKG 2-3 weeks ago through insurance and was told -Normal Comments Patient states he will get Flu Vaccine at a later date and had pneumonia vaccine with in the past 5 years   CC:  Yearly follow-up (not fasting) and patient states he had an EKG 2-3 weeks ago through insurance and was told -Normal.  History of Present Illness: Here for Medicare AWV: 1.Risk factors based on Past M, S, F history:HTN; PTE X2;Hypothyroidism (Chart updated) 2.Physical Activities: Stationary bike 4+ mpd & aerobics 4-5X/week 3.Depression/mood: no issues 4.Hearing: whisper heard @ 6 ft 5.ADL's: no limitations 6.Fall Risk: no issues 7.Home Safety: no issues 8.Height, weight, &visual acuity:wall chart read @ 6 ft with lenses 9.Counseling: none requested; POA & Living Will in place 10.Labs ordered based on risk factors: see Orders 11.Referral Coordination: none requested  12. Care Plan: see patient Instructions 13. Cognitive Assessment: Oriented X 3 ; memory & recall  intact  ; "WORLD" spelled backward ;math ability good; mood & affect normal. Complete labs done for insurance exam 3 weeks ago;EKG &  labs requested  for review.  Preventive Screening-Counseling & Management  Alcohol-Tobacco     Alcohol drinks/day: 2 oz / day Vodka     Smoking Status: quit     Packs/Day: up to 3/4 ppd     Year Started: 1955     Year Quit: 1992  Caffeine-Diet-Exercise     Caffeine use/day: 1-2 cups /day     Diet Comments: decreased sugar & flour  Hep-HIV-STD-Contraception     Dental Visit-last 6 months yes     Sun  Exposure-Excessive: no  Safety-Violence-Falls     Seat Belt Use: yes     Firearms in the Home: firearms in the home     Firearm Counseling: not indicated; uses recommended firearm safety measures     Smoke Detectors: yes      Sexual History:  currently monogamous and Married 53 years.        Blood Transfusions:  no.        Travel History:  no foreign travel.    Current Medications (verified): 1)  [facility] 2)  Armour Thyroid 120 Mg  Tabs (Thyroid) .... **appointment Due**1 By Mouth Once Daily Except 2 Tabs Mon, Wed, & Fri 3)  Vit D 1000mg  Qd 4)  Coumadin 5 Mg Tabs (Warfarin Sodium) .... Take As Directed By Coumadin Clinic.  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 malignant" Cervical fusion 2000 Lumbar laminectomy 2000 Tonsillectomy Colonoscopy negative , Victorino Dike MD  Family History:  No FH melanoma or skin cancer Father: Alzheimer's Mother: breast cancer Siblings: sister : breast cancer  Social History: Married Alcohol use-yes Regular exercise-yes Retired Packs/Day:  up to 3/4 ppd Caffeine use/day:  1-2 cups /day Dental Care w/in 6 mos.:  yes Sun Exposure-Excessive:  no Seat Belt Use:  yes Sexual History:  currently monogamous, Married 53 years Blood Transfusions:  no  Review of Systems General:  Denies fatigue; Weight loss 15 # with diet.. Eyes:  Denies blurring, double vision, and vision loss-both eyes. ENT:  Denies difficulty swallowing and hoarseness. CV:  Denies chest pain or discomfort and palpitations. Resp:  Denies cough,  shortness of breath, and sputum productive. GI:  Denies abdominal pain, bloody stools, dark tarry stools, and indigestion. GU:  PSA 2.74 by Dr Vonita Moss 09/23/2009.. MS:  Complains of joint pain; denies joint redness, joint swelling, low back pain, mid back pain, and thoracic pain; Index finger MCP injected as needed by Dr Teressa Senter. Derm:  Complains of lesion(s); denies changes in nail beds, dryness, and hair loss; "Scar tissue" of LE treated  by Dr Donzetta Starch. Neuro:  Denies disturbances in coordination, numbness, poor balance, and tingling. Endo:  Denies cold intolerance, excessive hunger, excessive thirst, excessive urination, and heat intolerance. Heme:  Denies abnormal bruising and bleeding. Allergy:  Denies itching eyes, seasonal allergies, and sneezing.  Physical Exam  General:  well-nourished,appears younger than age; alert,appropriate and cooperative throughout examination Head:  Normocephalic and atraumatic without obvious abnormalities.  Eyes:  No corneal or conjunctival inflammation noted. EOMI. Perrla. Funduscopic exam benign, without hemorrhages, exudates or papilledema. Vision grossly normal. Ears:  External ear exam shows no significant lesions;post op chages L ear lobe. Otoscopic examination reveals clear canals, tympanic membranes are intact bilaterally without bulging, retraction, inflammation or discharge. Hearing is grossly normal bilaterally. Nose:  External nasal examination shows no deformity or inflammation. Nasal mucosa are pink and moist without lesions or exudates. Mouth:  Oral mucosa and oropharynx without lesions or exudates.  Teeth in good repair. Neck:  No deformities, masses, or tenderness noted. Lungs:  Normal respiratory effort, chest expands symmetrically. Lungs are clear to auscultation, no crackles or wheezes. Heart:  normal rate, regular rhythm, no gallop, no rub, no JVD, no HJR, and grade 1/2  /6 systolic murmur @ R base. Intermittent click @ apex.   Abdomen:   Bowel sounds positive,abdomen soft and non-tender without masses, organomegaly or hernias noted. Genitalia:  Dr Vonita Moss Pulses:  R and L carotid,radial,dorsalis pedis and posterior tibial pulses are full and equal bilaterally Extremities:  No clubbing, cyanosis, edema, or deformity noted with normal full range of motion of all joints. Fusiiform knees with some crepitus  Neurologic:  alert & oriented X3 and DTRs symmetrical and normal.   Skin:  Sclerotic changes over shins Cervical Nodes:  No lymphadenopathy noted Axillary Nodes:  No palpable lymphadenopathy Psych:  memory intact for recent and remote and normally interactive.     Impression & Recommendations:  Problem # 1:  PREVENTIVE HEALTH CARE (ICD-V70.0)  Orders: [facility] -Subsequent Annual Wellness Visit [id number])  Problem # 2:  HYPOTHYROIDISM (ICD-244.9)  S/P thyroidectomy 1971 ,? malignant nodule His updated medication list for this problem includes:    Armour Thyroid 120 Mg Tabs (Thyroid) .Marland Kitchen... 1 by mouth once daily except 2 tabs mon, wed, & fri  Orders: [facility] -Subsequent Annual Wellness Visit [phone])  Problem # 3:  PULMONARY EMBOLISM, HX OF (ICD-V12.51)  due to #4 His updated medication list for this problem includes:  Coumadin 5 Mg Tabs (Warfarin sodium) .Marland Kitchen... Take as directed by coumadin clinic.  Orders: [facility] -Subsequent Annual Wellness Visit [phone])  Problem # 4:  PROTEIN C DEFICIENCY (ICD-286.9)  Orders: [facility] -Subsequent Annual Wellness Visit [phone])  Problem # 5:  MELANOMA, EAR (ICD-172.2)  PMH of  Orders: [facility] -Subsequent Annual Wellness Visit [phone])  Complete Medication List: 1)  Coumadin Clinic  2)  Armour Thyroid 120 Mg Tabs (Thyroid) .Marland Kitchen.. 1 by mouth once daily except 2 tabs mon, wed, & fri 3)  Vit D 1000mg  Qd  4)  Coumadin 5 Mg Tabs (Warfarin sodium) .... Take as directed by coumadin clinic.  Patient Instructions: 1)  Please sign Release of Records for insurance labs. Prescriptions: ARMOUR THYROID 120  MG  TABS (THYROID) 1 by mouth once daily except 2 tabs mon, wed, & fri  #600 x 0   Entered and Authorized by:   Marga Melnick MD   Signed by:   Marga Melnick MD on 11/05/2009   Method used:   Print then Give to Patient   RxID:   [id number]    Immunization History:  Zostavax History:    Zostavax # 1:  zostavax (03/14/2006)

## 2010-03-17 NOTE — Medication Information (Signed)
Summary: rov/jb      Allergies Added: NKDA Anticoagulant Therapy  Managed by: Louann Sjogren, PharmD Referring MD: Shan Levans Supervising MD: Shirlee Latch MD, Dalton Indication 1: Pulmonary embolus (ICD-415.19) Indication 2: Deep Venous Thrombosis (ICD-451.19) Lab Used: [facility] Site: [facility] INR POC 2.4 INR RANGE 2 - 3  Dietary changes: no    Health status changes: no    Bleeding/hemorrhagic complications: no    Recent/future hospitalizations: no    Any changes in medication regimen? no    Recent/future dental: no  Any missed doses?: no       Is patient compliant with meds? yes       Current Medications (verified): 1)  [facility]in Clinic 2)  Armour Thyroid 120 Mg  Tabs (Thyroid) .Marland Kitchen.. 1 By Mouth Once Daily Except 2 Tabs Mon, Wed, & Fri 3)  Vit D 1000mg  Qd 4)  Coumadin 5 Mg Tabs (Warfarin Sodium) .... Take As Directed By Coumadin Clinic.  Allergies (verified): No Known Drug Allergies  Anticoagulation Management History:      The patient is taking warfarin and comes in today for a routine follow up visit.  Positive risk factors for bleeding include an age of 46 years or older.  The bleeding index is 'intermediate risk'.  Positive CHADS2 values include Age > 68 years old.  The start date was 06/19/2003.  His last INR was 2.5 RATIO.  Anticoagulation responsible provider: Shirlee Latch MD, Dalton.  INR POC: 2.4.  Exp: 10/2010.    Anticoagulation Management Assessment/Plan:      The patient's current anticoagulation dose is Coumadin 5 mg tabs: Take as directed by coumadin clinic..  The target INR is 2 - 3.  The next INR is due 11/08/2009.  Anticoagulation instructions were given to patient.  Results were reviewed/authorized by Louann Sjogren, PharmD.         Prior Anticoagulation Instructions: INR 3.3  Hold Tuesdays dose  Take 1 tab everyday except for Thursday when you take 1.5 tabs  Follow-up in 2 weeks  Current Anticoagulation Instructions: Continue taking  warfarin as taking at home: Take 1 tablet daily except take 1 1/2 tablets on Thursdays.

## 2010-03-17 NOTE — Progress Notes (Signed)
Summary: refill  Phone Note Refill Request Message from:  Fax from Pharmacy on October 20, 2009 2:56 PM  Refills Requested: Medication #1:  ARMOUR THYROID 120 MG  TABS 1 by mouth once daily except 2 tabs [facility] - fax [phone]  Initial call taken by: Okey Regal [place],  October 20, 2009 2:57 PM    New/Updated Medications: ARMOUR THYROID 120 MG  TABS (THYROID) **APPOINTMENT DUE**1 by mouth once daily except 2 tabs mon, wed, & fri Prescriptions: ARMOUR THYROID 120 MG  TABS (THYROID) **APPOINTMENT DUE**1 by mouth once daily except 2 tabs mon, wed, & fri  #44 x 0   Entered by:   Shonna Chock CMA   Authorized by:   Marga Melnick MD   Signed by:   Shonna Chock CMA on 10/20/2009   Method used:   Electronically to        [facility] (retail)       [address]       [place], [place]  [id number]       Ph: [phone]       Fax: [phone]   RxID:   [id number]

## 2010-03-17 NOTE — Medication Information (Signed)
Summary: rov/ewj  Anticoagulant Therapy  Managed by: Cloyde Reams, RN, BSN Referring MD: Shan Levans Supervising MD: Antoine Poche MD, Fayrene Fearing Indication 1: Pulmonary embolus (ICD-415.19) Indication 2: Deep Venous Thrombosis (ICD-451.19) Lab Used: [facility] Site: [facility] INR POC 2.5 INR RANGE 2 - 3  Dietary changes: no    Health status changes: no    Bleeding/hemorrhagic complications: no    Recent/future hospitalizations: no    Any changes in medication regimen? no    Recent/future dental: no  Any missed doses?: no       Is patient compliant with meds? yes      Comments: Pt states has been taking 5mg  daily except 7.5mg  on Mondays and Thursdays.    Allergies: No Known Drug Allergies  Anticoagulation Management History:      The patient is taking warfarin and comes in today for a routine follow up visit.  Positive risk factors for bleeding include an age of 75 years or older.  The bleeding index is 'intermediate risk'.  Positive CHADS2 values include Age > 75 years old.  The start date was 06/19/2003.  His last INR was 2.5 RATIO.  Anticoagulation responsible provider: Antoine Poche MD, Fayrene Fearing.  INR POC: 2.5.  Cuvette Lot#: [id number].  Exp: 10/2010.    Anticoagulation Management Assessment/Plan:      The patient's current anticoagulation dose is Coumadin 5 mg tabs: Take as directed by coumadin clinic..  The target INR is 2 - 3.  The next INR is due 09/27/2009.  Anticoagulation instructions were given to patient.  Results were reviewed/authorized by Cloyde Reams, RN, BSN.  He was notified by Cloyde Reams RN.         Prior Anticoagulation Instructions: INR 2.0  Start taking 1 tablet daily except 1.5 tablets on Mondays. Recheck in 4 weeks.    Current Anticoagulation Instructions: INR 2.5  Continue on same dosage 1 tablet daily except 1.5 tablets on Mondays and Thursdays.  Recheck in 4 weeks.

## 2010-03-17 NOTE — Miscellaneous (Signed)
Summary: Letter for Insurance  To whom it may concern:    Clarence Clarence Hopkins is a patient of mine.  I have been treating Clarence Hopkins for several years for a history of thromboembolic disease.  He is taking lifelong coumadin therapy.  He was in to see me recently for a complete examination.  At that time, I found no evidence of active thrombotic disease.  Specifically his lower extremities were free of any active disease process.    At this time his thromboembolic disease is quiescent and he continues to enjoy excellent health.  If you need any further information please do not hesitate to contact me.    Sincerely Yours       Shan Levans MD. Clinical Lists Changes  Appended Document: Letter for Insurance Letter faxed to pt's attn at [phone] per PW's request.

## 2010-03-17 NOTE — Medication Information (Signed)
Summary: rov/td  Anticoagulant Therapy  Managed by: Shelby Dubin, PharmD, BCPS, CPP Referring MD: Shan Levans Supervising MD: Jens Som MD, Arlys John Indication 1: Pulmonary embolus (ICD-415.19) Indication 2: Deep Venous Thrombosis (ICD-451.19) Lab Used: [facility] Site: [facility] INR POC 2.4 INR RANGE 2 - 3  Dietary changes: no    Health status changes: no    Bleeding/hemorrhagic complications: no    Recent/future hospitalizations: no    Any changes in medication regimen? no    Recent/future dental: no  Any missed doses?: no       Is patient compliant with meds? yes       Allergies (verified): No Known Drug Allergies  Anticoagulation Management History:      The patient is taking warfarin and comes in today for a routine follow up visit.  Positive risk factors for bleeding include an age of 19 years or older.  The bleeding index is 'intermediate risk'.  Positive CHADS2 values include Age > 32 years old.  The start date was 06/19/2003.  His last INR was 2.5 RATIO.  Anticoagulation responsible provider: Jens Som MD, Arlys John.  INR POC: 2.4.  Cuvette Lot#: [id number].  Exp: 05/2010.    Anticoagulation Management Assessment/Plan:      The patient's current anticoagulation dose is Coumadin 5 mg tabs: Take as directed by coumadin clinic..  The target INR is 2 - 3.  The next INR is due 03/29/2009.  Anticoagulation instructions were given to patient.  Results were reviewed/authorized by Shelby Dubin, PharmD, BCPS, CPP.  He was notified by Ysidro Evert, Pharm D Candidate.         Prior Anticoagulation Instructions: The patient is to continue with the same dose of coumadin.  This dosage includes:   1 tablet daily  Current Anticoagulation Instructions: INR: 2.4 Continue with same dosage of 5mg  tablet daily Recheck in 4 weeks

## 2010-03-17 NOTE — Medication Information (Signed)
Summary: rov/ewj   Anticoagulant Therapy  Managed by: Weston Brass, PharmD Referring MD: Shan Levans Supervising MD: Excell Seltzer MD, Casimiro Needle Indication 1: Pulmonary embolus (ICD-415.19) Indication 2: Deep Venous Thrombosis (ICD-451.19) Lab Used: [facility] Site: [facility] INR POC 2.2 INR RANGE 2 - 3  Dietary changes: no    Health status changes: no    Bleeding/hemorrhagic complications: no    Recent/future hospitalizations: no    Any changes in medication regimen? no    Recent/future dental: no  Any missed doses?: no       Is patient compliant with meds? yes       Allergies: No Known Drug Allergies  Anticoagulation Management History:      The patient is taking warfarin and comes in today for a routine follow up visit.  Positive risk factors for bleeding include an age of 75 years or older.  The bleeding index is 'intermediate risk'.  Positive CHADS2 values include Age > 28 years old.  The start date was 06/19/2003.  His last INR was 2.5 RATIO.  Anticoagulation responsible provider: Excell Seltzer MD, Casimiro Needle.  INR POC: 2.2.  Cuvette Lot#: [id number].  Exp: 01/2011.    Anticoagulation Management Assessment/Plan:      The patient's current anticoagulation dose is Coumadin 5 mg tabs: Take as directed by coumadin clinic..  The target INR is 2 - 3.  The next INR is due 01/31/2010.  Anticoagulation instructions were given to patient.  Results were reviewed/authorized by Weston Brass, PharmD.  He was notified by Hoy Register, PharmD Candidate.         Prior Anticoagulation Instructions: INR 2.5  Continue on same dosage 1 tablet daily except 1.5 tablets on Thursdays.  Recheck in 4 weeks.    Current Anticoagulation Instructions: INR 2.2 Continue previous dose of 1 tablet everyday except 1.5 tablet on Thursday Recheck INR in 4 weeks

## 2010-03-17 NOTE — Medication Information (Signed)
Summary: rov/am   Anticoagulant Therapy  Managed by: Lyna Poser, PharmD Referring MD: Shan Levans Supervising MD: Excell Seltzer MD,Michael Indication 1: Pulmonary embolus (ICD-415.19) Indication 2: Deep Venous Thrombosis (ICD-451.19) Lab Used: [facility] Site: [facility] INR POC 2.2 INR RANGE 2 - 3  Dietary changes: no    Health status changes: no    Bleeding/hemorrhagic complications: no    Recent/future hospitalizations: no    Any changes in medication regimen? yes       Details: started a BPH med in august  Recent/future dental: no  Any missed doses?: no       Is patient compliant with meds? yes       Allergies: No Known Drug Allergies  Anticoagulation Management History:      The patient is taking warfarin and comes in today for a routine follow up visit.  Positive risk factors for bleeding include an age of 75 years or older.  The bleeding index is 'intermediate risk'.  Positive CHADS2 values include Age > 75 years old.  The start date was 06/19/2003.  His last INR was 2.5 RATIO.  Anticoagulation responsible provider: Excell Seltzer MD,Michael.  INR POC: 2.2.  Cuvette Lot#: [id number].  Exp: 10/2010.    Anticoagulation Management Assessment/Plan:      The patient's current anticoagulation dose is Coumadin 5 mg tabs: Take as directed by coumadin clinic..  The target INR is 2 - 3.  The next INR is due 12/06/2009.  Anticoagulation instructions were given to patient.  Results were reviewed/authorized by Lyna Poser, PharmD.         Prior Anticoagulation Instructions: Continue taking warfarin as taking at home: Take 1 tablet daily except take 1 1/2 tablets on Thursdays.   Current Anticoagulation Instructions: INR 2.2 No changes today. Continue taking 1 tablet everyday except thursday. On thursdays take 1.5 tablets. See you in 4 weeks.

## 2010-03-17 NOTE — Medication Information (Signed)
Summary: rov/tp   Anticoagulant Therapy  Managed by: Geoffry Paradise, PharmD Referring MD: Shan Levans Supervising MD: Tenny Craw MD, Gunnar Fusi Indication 1: Pulmonary embolus (ICD-415.19) Indication 2: Deep Venous Thrombosis (ICD-451.19) Lab Used: [facility] Site: [facility] INR POC 2.3 INR RANGE 2 - 3  Dietary changes: no    Health status changes: no    Bleeding/hemorrhagic complications: no    Recent/future hospitalizations: no    Any changes in medication regimen? no    Recent/future dental: no  Any missed doses?: no       Is patient compliant with meds? yes       Allergies: No Known Drug Allergies  Anticoagulation Management History:      Positive risk factors for bleeding include an age of 42 years or older.  The bleeding index is 'intermediate risk'.  Positive CHADS2 values include Age > 62 years old.  The start date was 06/19/2003.  His last INR was 2.5 RATIO.  Anticoagulation responsible provider: Tenny Craw MD, Gunnar Fusi.  INR POC: 2.3.  Cuvette Lot#: [id number].  Exp: 02/2011.    Anticoagulation Management Assessment/Plan:      The patient's current anticoagulation dose is Coumadin 5 mg tabs: Take as directed by coumadin clinic..  The target INR is 2 - 3.  The next INR is due 03/29/2010.  Anticoagulation instructions were given to patient.  Results were reviewed/authorized by Geoffry Paradise, PharmD.         Prior Anticoagulation Instructions: INR 2.5 Continue 5mg s daily except 7.5mg s on Thursdays. Recheck in 4 weeks.   Current Anticoagulation Instructions: INR:  2.3  Your INR is at goal today.  Please continue your coumadin at 1 tablet every day except 1.5 tablet every Thursday.    Please return in 4 weeks for another INR check.

## 2010-03-17 NOTE — Miscellaneous (Signed)
Summary: Orders Update  Clinical Lists Changes  Orders: Added new Test order of Venous Duplex Lower Extremity (Venous Duplex Lower) - Signed 

## 2010-03-28 DIAGNOSIS — I2699 Other pulmonary embolism without acute cor pulmonale: Secondary | ICD-10-CM

## 2010-03-28 DIAGNOSIS — I82409 Acute embolism and thrombosis of unspecified deep veins of unspecified lower extremity: Secondary | ICD-10-CM

## 2010-03-28 DIAGNOSIS — Z86718 Personal history of other venous thrombosis and embolism: Secondary | ICD-10-CM

## 2010-03-29 ENCOUNTER — Encounter (INDEPENDENT_AMBULATORY_CARE_PROVIDER_SITE_OTHER): Payer: PRIVATE HEALTH INSURANCE

## 2010-03-29 ENCOUNTER — Encounter: Payer: Self-pay | Admitting: Cardiovascular Disease

## 2010-03-29 DIAGNOSIS — Z7901 Long term (current) use of anticoagulants: Secondary | ICD-10-CM

## 2010-03-29 DIAGNOSIS — I2699 Other pulmonary embolism without acute cor pulmonale: Secondary | ICD-10-CM

## 2010-04-06 NOTE — Medication Information (Signed)
Summary: rov/kh   Anticoagulant Therapy  Managed by: Andrew Au, PharmD  Referring MD: Shan Levans Supervising MD: Tenny Craw MD, Gunnar Fusi Indication 1: Pulmonary embolus (ICD-415.19) Indication 2: Deep Venous Thrombosis (ICD-451.19) Lab [facility]  Site: [facility] INR POC 2.0 INR RANGE 2 - 3  Dietary changes: yes       Details: eating more dark greens   Health status changes: no    Bleeding/hemorrhagic complications: no    Recent/future hospitalizations: no    Any changes in medication regimen? no    Recent/future dental: no  Any missed doses?: yes     Details: missed one dose last thursday  Is patient compliant with meds? yes       Allergies: No Known Drug Allergies  Anticoagulation Management History:      The patient is taking warfarin and comes in today for a routine follow up visit.  Positive risk factors for bleeding include an age of 75 years or older.  The bleeding index is 'intermediate risk'.  Positive CHADS2 values include Age > 47 years old.  The start date was 06/19/2003.  His last INR was 2.5 RATIO.  Anticoagulation responsible provider: Tenny Craw MD, Gunnar Fusi.  INR POC: 2.0.  Cuvette Lot#: [id number].  Exp: 02/2011.    Anticoagulation Management Assessment/Plan:      The patient's current anticoagulation dose is Coumadin 5 mg tabs: Take as directed by coumadin clinic..  The target INR is 2 - 3.  The next INR is due 04/25/2010.  Anticoagulation instructions were given to patient.  Results were reviewed/authorized by Andrew Au, PharmD .         Prior Anticoagulation Instructions: INR:  2.3  Your INR is at goal today.  Please continue your coumadin at 1 tablet every day except 1.5 tablet every Thursday.    Please return in 4 weeks for another INR check.     Current Anticoagulation Instructions: INR 2  Continue taking 1 tablet everday except 1 and 1/2 tablets on Thursdays. Recheck INR in 4 weeks.

## 2010-04-25 ENCOUNTER — Encounter (INDEPENDENT_AMBULATORY_CARE_PROVIDER_SITE_OTHER): Payer: Medicare Other

## 2010-04-25 ENCOUNTER — Encounter: Payer: Self-pay | Admitting: Cardiology

## 2010-04-25 DIAGNOSIS — Z7901 Long term (current) use of anticoagulants: Secondary | ICD-10-CM

## 2010-04-25 DIAGNOSIS — I80299 Phlebitis and thrombophlebitis of other deep vessels of unspecified lower extremity: Secondary | ICD-10-CM

## 2010-04-25 DIAGNOSIS — I2699 Other pulmonary embolism without acute cor pulmonale: Secondary | ICD-10-CM

## 2010-05-03 NOTE — Medication Information (Signed)
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 no    Recent/future dental: no  Any missed doses?: no       Is patient compliant with meds? yes       Allergies: No Known Drug Allergies  Anticoagulation Management History:      The patient is taking warfarin and comes in today for a routine follow up visit.  Positive risk factors for bleeding include an age of 75 years or older.  The bleeding index is 'intermediate risk'.  Positive CHADS2 values include Age > 75 years old.  The start date was 06/19/2003.  His last INR was 2.5 RATIO.  Anticoagulation responsible provider: Riley Kill MD, Maisie Fus.  INR POC: 2.8.  Cuvette Lot#: [id number].  Exp: 04/2011.    Anticoagulation Management Assessment/Plan:      The patient's current anticoagulation dose is Coumadin 5 mg tabs: Take as directed by coumadin clinic..  The target INR is 2 - 3.  The next INR is due 05/23/2010.  Anticoagulation instructions were given to patient.  Results were reviewed/authorized by Cloyde Reams, RN, BSN.  He was notified by Cloyde Reams RN.         Prior Anticoagulation Instructions: INR 2  Continue taking 1 tablet everday except 1 and 1/2 tablets on Thursdays. Recheck INR in 4 weeks.   Current Anticoagulation Instructions: INR 2.8  Continue on same dosage 1 tablet daily except 1.5 tablets on Thursdays.  Recheck in 4 weeks.

## 2010-05-23 ENCOUNTER — Ambulatory Visit (INDEPENDENT_AMBULATORY_CARE_PROVIDER_SITE_OTHER): Payer: PRIVATE HEALTH INSURANCE | Admitting: *Deleted

## 2010-05-23 DIAGNOSIS — Z7901 Long term (current) use of anticoagulants: Secondary | ICD-10-CM | POA: Insufficient documentation

## 2010-05-23 DIAGNOSIS — I2699 Other pulmonary embolism without acute cor pulmonale: Secondary | ICD-10-CM

## 2010-05-23 DIAGNOSIS — Z86718 Personal history of other venous thrombosis and embolism: Secondary | ICD-10-CM

## 2010-05-23 DIAGNOSIS — I82409 Acute embolism and thrombosis of unspecified deep veins of unspecified lower extremity: Secondary | ICD-10-CM

## 2010-06-17 ENCOUNTER — Other Ambulatory Visit: Payer: Self-pay | Admitting: Surgery

## 2010-06-17 ENCOUNTER — Ambulatory Visit (HOSPITAL_BASED_OUTPATIENT_CLINIC_OR_DEPARTMENT_OTHER)
Admission: RE | Admit: 2010-06-17 | Discharge: 2010-06-17 | Disposition: A | Discharge status: Home / Self Care | Payer: Medicare Other | Source: Ambulatory Visit | Attending: Surgery | Admitting: Surgery

## 2010-06-17 DIAGNOSIS — D1739 Benign lipomatous neoplasm of skin and subcutaneous tissue of other sites: Secondary | ICD-10-CM | POA: Insufficient documentation

## 2010-06-20 NOTE — Op Note (Signed)
  NAME:  Clarence, Hopkins NO.:  [id number]  MEDICAL RECORD NO.:  [id number]            PATIENT TYPE:  LOCATION:                                 FACILITY:  PHYSICIAN:  Currie Paris, M.D.   DATE OF BIRTH:  DATE OF PROCEDURE: DATE OF DISCHARGE:                              OPERATIVE REPORT   PREOPERATIVE DIAGNOSIS:  Lipoma, forearm.  POSTOPERATIVE DIAGNOSIS:  Lipoma, forearm.  PROCEDURE:  Removal of lipoma.  SURGEON:  Currie Paris, MD  ANESTHESIA:  Local.  CLINICAL HISTORY:  This is a 77 year old gentleman with a soft mass in the subcutaneous tissue of the mid left forearm that he wished to have excise for symptomatic relief of discomfort.  DESCRIPTION OF PROCEDURE:  I saw the patient in the preoperative area and confirmed the plans and encircled the lesion.  The patient was taken to the operating room and we again confirmed the plans for the procedure, location, etc.  Once that was done, I prepped the area with some alcohol and anesthetized with 1% Xylocaine with epinephrine.  I waited 10 minutes.  The area was prepped and draped.  I made a longitudinal incision and the couple of superficial veins were ligated with 4-0 Vicryl.  The lipoma was identified and sharply excised using scissors staying very close to lipoma to avoid any injury to the cutaneous nerves or other vessels.  It seemed to come out intact.  It was lying on the fascia.  I made sure everything was dry and then closed the layers with 3-0 Vicryl, 4-0 Monocryl subcuticular and Dermabond.  After we had it closed, I held pressure for approximately 5 minutes to reduce the chance of postop bleeding since he has been anticoagulated and will go back on Coumadin.  Sterile dressings were applied.  The patient tolerated the procedure well.  There were no complications.     Currie Paris, M.D.    CJS/MEDQ  D:  06/17/2010  T:  06/18/2010  Job:  [id number]  Electronically Signed  by Cyndia Bent M.D. on 06/20/2010 08:50:33 AM

## 2010-06-21 ENCOUNTER — Ambulatory Visit (INDEPENDENT_AMBULATORY_CARE_PROVIDER_SITE_OTHER): Payer: Medicare Other | Admitting: *Deleted

## 2010-06-21 DIAGNOSIS — I2699 Other pulmonary embolism without acute cor pulmonale: Secondary | ICD-10-CM

## 2010-06-21 DIAGNOSIS — I82409 Acute embolism and thrombosis of unspecified deep veins of unspecified lower extremity: Secondary | ICD-10-CM

## 2010-06-21 DIAGNOSIS — Z86718 Personal history of other venous thrombosis and embolism: Secondary | ICD-10-CM

## 2010-06-28 NOTE — H&P (Signed)
NAME:  [name], [name]:  [id number]   MEDICAL RECORD NO.:  [id number]          PATIENT TYPE:  INP   LOCATION:  3109                         FACILITY:  [facility]   PHYSICIAN:  Danae Orleans. Venetia Maxon, M.D.  DATE OF BIRTH:  [birth year]   DATE OF ADMISSION:  02/19/2007  DATE OF DISCHARGE:                              HISTORY & PHYSICAL   DATE OF OPERATION:  February 19, 2007.   REASON FOR ADMISSION:  Severe left neck pain and arm pain.   HISTORY OF ILLNESS:  Clarence Hopkins is a 77 year old established patient of  mine with a greater than 1-week history of severe left neck and arm  pain.  On an MRI, he was found to have a herniated cervical disk at C3-4  on the left.  He has had prior surgery at C5 through C7 levels.  He has  a history of deep venous thrombosis causing a pulmonary embolus after  knee surgery, and he has been on chronic anticoagulation with Coumadin.  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   He has no known drug allergies.   EXAMINATION:  He is an awake, alert, pleasant, cooperative, very  uncomfortable man with left-sided neck pain.  Positive Spurling maneuver  to the left with left parascapular and left trapezius pain.  He has  difficulty raising his left arm secondary to pain.  His strength is full  in all motor groups otherwise in the upper and lower extremities.  Reflexes are symmetric.  He has hypalgesia to pinprick in the left C4  distribution.   IMPRESSION:  Left C4 radiculopathy secondary to herniated cervical disk.  Recommended an anterior cervical decompression and fusion at C3-4 level.  He is being held on Coumadin.  He was started on Lovenox.  When his INR  is normalized, the plan was to take him to surgery.  At the time of  February 19, 2007, his INR had reached 1.2 and it was elected to proceed  with surgery.      Danae Orleans. Venetia Maxon, M.D.  Electronically Signed     JDS/MEDQ  D:  02/19/2007  T:  02/20/2007  Job:  [id number]

## 2010-06-28 NOTE — Op Note (Signed)
NAME:  Clarence Hopkins, Clarence Hopkins NO.:  [id number]   MEDICAL RECORD NO.:  [id number]          PATIENT TYPE:  INP   LOCATION:  3109                         FACILITY:  [facility]   PHYSICIAN:  Danae Orleans. Venetia Maxon, M.D.  DATE OF BIRTH:  1930-11-14   DATE OF PROCEDURE:  02/19/2007  DATE OF DISCHARGE:                               OPERATIVE REPORT   PREOPERATIVE DIAGNOSIS:  Herniated cervical disk with cervical  spondylosis and stenosis, C3-4, with cervical radiculopathy.   POSTOPERATIVE DIAGNOSIS:  Herniated cervical disk with cervical  spondylosis and stenosis, C3-4, with cervical radiculopathy.   PROCEDURE:  Anterior cervical decompression and fusion, C3-4, with  allograft bone graft, morcellized bone autograft and anterior cervical  plate.   SURGEON:  Danae Orleans. Venetia Maxon, M.D.   ASSISTANT:  Clydene Fake, M.D.   ANESTHESIA:  General endotracheal anesthesia.   ESTIMATED BLOOD LOSS:  Minimal.   COMPLICATIONS:  None.   DISPOSITION:  To recovery.   INDICATIONS:  Clarence Hopkins is a 77 year old man with severe left arm pain  and left-sided neck pain with a large disk herniation at C3-4 on the  left.  It was elected to take him to surgery for anterior cervical  decompression and fusion.  He had previously undergone anterior cervical  surgery at C5 through C7 levels.   PROCEDURE:  Clarence Hopkins was brought to the operating room.  He was placed  in a supine position on the operating table.  Following satisfactory and  uncomplicated induction of general endotracheal anesthesia and placement  of intravenous lines, the neck was placed in slight extension.  He was  placed in 5 pounds of halter traction.  His anterior neck was then  prepped and draped in the usual sterile fashion.  The area of planned  incision was infiltrated with 0.25% Marcaine and 0.5% lidocaine with  1:200,000 epinephrine.  Initially an incision was made from midline to  the anterior border of the sternocleidomastoid  muscle, carried sharply  through the platysma layer.  Subplatysmal dissection was performed.  The  anterior border of the sternocleidomastoid muscle was identified.  Using  blunt dissection, the carotid sheath was kept lateral and the trachea  and esophagus kept medial, exposing the anterior cervical spine.  The  previously scarred-in plate was identified and subsequently exposure was  carried cephalad to what was felt to be the C3-4 level.  The  intraoperative x-ray with a bent spinal needle confirmed this to be the  C3-4 level.  Subsequently the longus colli muscles were taken down from  the anterior cervical spine as well as soft tissue with electrocautery  and Key elevator.  Self-retaining shadow line retractor was placed along  with up and down retractors.  The disk space was incised and the disk  material was removed in piecemeal fashion.  The disk space was highly  degenerated.  Distraction pins were placed at C3 and C4 and the  interspace was distracted.  Endplates were stripped of residual disk  material.  The microscope was brought into field after the endplates  were burred down with a  drill and bone autograft was saved for later use  with bone grafting.  Subsequently the posterior longitudinal ligament  was removed in a piecemeal fashion and the spinal cord dura was  decompressed, as were both C4 nerve roots.  There was a large amount of  herniated disk material laterally overlying the C4 nerve root on the  left, and this was carefully decompressed.  Multiple large fragments of  disk material were removed with resultant significant decompression of  the left C4 nerve root.  Hemostasis was assured and after trial sizing a  7-mm bone allograft wedge was selected, fashioned with the high-speed  drill, packed with morcellized bone autograft and demineralized bone  matrix and inserted into the interspace and countersunk appropriately.  The anterior cervical plate was then affixed to  the anterior cervical  spine using a Trestle plating system with 14 x 4-mm variable-angled  screws, two at C4 and two at C3.  All screws had excellent purchase.  Locking mechanisms were engaged.  Prior to placing the plate, the  traction weight was removed.  The wound was then irrigated, soft tissue  was inspected and found to be in good repair.  Final x-ray demonstrated  well-positioned interbody graft and anterior cervical plate.  The  platysmal layer was closed with 3-0 Vicryl sutures and the skin edges  were approximated with 3-0 Vicryl subcuticular stitch.  The wound was  dressed with Dermabond.  The patient was extubated in the operating room  and taken to the recovery room, having tolerated his operation well.  All counts were correct at the end of the case.      Danae Orleans. Venetia Maxon, M.D.  Electronically Signed     JDS/MEDQ  D:  02/19/2007  T:  02/20/2007  Job:  [id number]

## 2010-06-28 NOTE — Discharge Summary (Signed)
NAME:  Clarence Hopkins, Clarence Hopkins NO.:  [id number]   MEDICAL RECORD NO.:  [id number]          PATIENT TYPE:  INP   LOCATION:  3033                         FACILITY:  [facility]   PHYSICIAN:  Danae Orleans. Venetia Maxon, M.D.  DATE OF BIRTH:  [birth year]   DATE OF ADMISSION:  02/19/2007  DATE OF DISCHARGE:  02/22/2007                               DISCHARGE SUMMARY   REASON FOR ADMISSION:  Cervical disk displacement, cervical spondylosis,  skin sensation disturbance, disorders of the thyroid, necrotizing  enterocolitis, retention of urine, and unspecified bladder neck  obstruction, status post arthrodesis, long-term use of anticoagulants.  Personal history of venous thrombosis and embolism. History of malignant  skin melanoma. Status post knee joint replacement.   FINAL DIAGNOSES:  Cervical disk displacement, cervical spondylosis, skin  sensation disturbance, disorders of the thyroid, necrotizing  enterocolitis, retention of urine, and unspecifiedbladder neck  obstruction, status post arthrodesis, long-term use of anticoagulants.  Personal history of venous thrombosis and embolism. History of malignant  skin melanoma. Status post knee joint replacement.   HISTORY OF ILLNESS AND HOSPITAL COURSE:  Clarence Hopkins is a 77 year old  established patient of mine who was admitted on February 19, 2007 with a  greater than one week history of severe left neck and arm pain. He had  an MRI which demonstrated a cervical disk herniation at C3-4 on the  left. He has had prior surgery at C5-C7 levels. He also has a history of  deep venous thrombosis causing pulmonary embolus after knee surgery. He  has been on chronic anticoagulation with Coumadin. Additionally, he has  a melanoma on his ear. He was admitted to the hospital on the same day  as procedure basis and underwent anterior cervical decompression and  fusion at the C3-4 level. He tolerated the surgery well and  postoperatively had urinary retention  requiring Foley catheter placement  and was seen by the urology service by Dr. Vonita Moss and was started on  Flomax given a voiding trial. He was doing well on the 8th and started  on subcutaneous Lovenox. Foley was planned to be discontinued on the  9th. He was restarted on Coumadin and was discharged home on the 9th in  stable and satisfactory condition, having tolerated his operation and  hospitalization well with discharge medications of Flomax, warfarin,  Lovenox, to follow up with home health with PT/INR and to manage  Coumadin dosing through [facility]. He is to follow  up with urology at the beginning of the next week.      Danae Orleans. Venetia Maxon, M.D.  Electronically Signed     JDS/MEDQ  D:  04/09/2007  T:  04/09/2007  Job:  [id number]

## 2010-06-28 NOTE — Consult Note (Signed)
NAME:  Clarence, Hopkins NO.:  [id number]   MEDICAL RECORD NO.:  [id number]          PATIENT TYPE:  INP   LOCATION:  3109                         FACILITY:  [facility]   PHYSICIAN:  Maretta Bees. Vonita Moss, M.D.DATE OF BIRTH:  Jul 04, 1930   DATE OF CONSULTATION:  02/20/2007  DATE OF DISCHARGE:                                 CONSULTATION   I was asked to see Clarence Hopkins, who is 77 years old and was last seen by me  several years ago when he went into retention postoperatively.  Since  then he has had some progression of his bladder outlet obstructive  symptoms with some increased frequency, nocturia and slow stream.  He  feels like he was ready to have some therapy for his voiding.  In the  meantime he underwent C-spine surgery yesterday and had abdominal pain  today and was found to be in urinary retention with 1000 mL pn a bladder  scan, and a Foley catheter was inserted and 1000 mL returned and much  relief was obtained from his discomfort.   He is in general good health.  He does have a history of pulmonary  emboli and is on Lovenox and is being started on Coumadin.   He does have a past history of tonsillectomy, hemorrhoidectomy,  thyroidectomy and surgery for melanoma, and he has also had a total knee  performed.   He does not smoke.  He drinks alcohol socially.   Medications include pain medications preoperatively.   He has no other serious medical illnesses.   EXAMINATION:  His blood pressure on admission was 120/70 and pulse was  80.  He was afebrile.  Right now he is alert and oriented, in no significant distress.  SKIN:  Warm and dry.  No respiratory distress.  ABDOMEN:  Soft, nontender.  No hernias or inguinal nodes.  Penis, urethral meatus, testicles, epididymes without lesions.  Perineum  is normal.  A Foley catheter is in place.  Anal sphincter tone is  normal.  Prostate feels just minimally enlarged, smooth and benign.  No  seminal vesicle tissue  palpated.   IMPRESSION:  1. Acute urinary retention.  2. Bladder neck contracture.   PLAN:  Start on Flomax daily and plan to keep him on it on a long-term  basis and undergo voiding trial with Foley catheter removal at 5 a.m. on  February 22, 2007, and then in-and-out catheterization after that.  It may  be necessary for him to go home with a Foley catheter in place but,  hopefully, he will void satisfactorily with this voiding trial.      Maretta Bees. Vonita Moss, M.D.  Electronically Signed     LJP/MEDQ  D:  02/20/2007  T:  02/21/2007  Job:  [id number]   cc:   Danae Orleans. Venetia Maxon, M.D.

## 2010-07-01 NOTE — Consult Note (Signed)
NAME:  Clarence Hopkins, Clarence Hopkins                        ACCOUNT NO.:  [id number]   MEDICAL RECORD NO.:  [id number]                   PATIENT TYPE:  INP   LOCATION:  2011                                 FACILITY:  [facility]   PHYSICIAN:  Carole Binning, M.D. [facility]         DATE OF BIRTH:  1930-03-04   DATE OF CONSULTATION:  06/22/2003  DATE OF DISCHARGE:                                   CONSULTATION   REQUESTING PHYSICIAN:  Dr. Shan Levans.   REASON FOR CONSULTATION:  Atrial fibrillation.   HISTORY OF PRESENT ILLNESS:  Clarence Hopkins is a very pleasant 77 year old male  with no known history of coronary artery disease.  He does have a cardiac  history dating back to approximately 6 years ago, when he had an episode of  chest pain for which he underwent cardiac catheterization.  At that time, he  was found to have normal LV function and no significant coronary artery  disease.   The patient recently underwent treatment for melanoma of the left ear with  excision.  He has been followed up at [facility] where late last week he had a CT  scan done to rule out metastatic lesions.  On the CT scan, he was  incidentally noted to have pulmonary embolism.  He was seen in the pulmonary  office and a spiral CT scan was obtained which showed bilateral pulmonary  emboli.  He was subsequently admitted for anticoagulation.   The patient has been on a telemetry bed.  Very early yesterday morning, he  got up to go to the bathroom and developed atrial fibrillation with a heart  rate in the 140s.  Blood pressure was elevated at 172/98.  The patient was  asymptomatic at that time.  He was given p.r.n. intravenous Lopressor for  rate control.  He spontaneously converted to normal sinus rhythm reportedly  after approximately 2 hours.  The patient denies any palpitations or other  symptoms associated with the episode.   The patient denies any known prior history of palpitations or atrial  fibrillation.  He denies any  recent chest pain.  He has had perhaps mild  exertional dyspnea, although has not noticed a significant change recently.  He denies any orthopnea or PND.  He has had some edema in his left lower  extremity which he has attributed to a recent knee replacement.   Cardiac risk factors are negative for hypertension, diabetes, hyperlipidemia  or family history of premature coronary artery disease.  He stopped smoking  approximately 12 years ago.   PAST MEDICAL HISTORY:  Past medical history notable only for hypothyroidism,  on thyroid replacement.  There is also a history of gastroesophageal reflux  disease.   PAST HISTORY:  Previous surgeries include cervical and thoracic spine  surgery, left knee surgery in December of 2004 and melanoma excision as per  HPI.   MEDICATIONS:  Medications prior to admission consisted only of Armour  Thyroid.  Current medications include Armour Thyroid, Protonix, Lovenox and Coumadin.   SOCIAL HISTORY, FAMILY HISTORY AND REVIEW OF SYSTEMS:  As documented in the  physician assistant's handwritten consultation note.   PHYSICAL EXAM:  GENERAL:  In general, this is a well-appearing older male in  no acute distress.  VITAL SIGNS:  Temperature is afebrile, 97.6; pulse 59 and regular; blood  pressure 108/77; respirations 20; oxygen saturation is 96% on room air.  SKIN:  Skin warm and dry without generalized rash.  HEENT:  Normocephalic, atraumatic.  Sclerae anicteric.  Oral mucosa  unremarkable.  NECK:  No lymphadenopathy or thyromegaly.  No JVD.  Carotid upstroke normal  without bruit.  CHEST:  Chest clear to percussion and auscultation.  CARDIAC:  PMI is nondisplaced.  Regular rate and rhythm.  Positive S4.  Normal S1 and S2.  No rub, murmur or S3.  ABDOMEN:  Abdomen soft and nontender, without organomegaly.  Normal bowel  sounds.  No bruit.  EXTREMITIES:  No clubbing, cyanosis, or edema.  Peripheral pulses are 2+  throughout.  There are no femoral  bruits.   LABORATORY AND ACCESSORY CLINICAL DATA:  EKG from admission reveals normal  sinus rhythm, a rate of 60, normal EKG.  Followup EKG also reveals normal  sinus rhythm.  Currently on telemetry, the patient is in sinus rhythm.   Other laboratory data are as documented in the chart.  He did have a normal  TSH at 0.648.   ASSESSMENT:  The patient is a 77 year old male who presents with bilateral  pulmonary emboli.  He had an episode of paroxysmal atrial fibrillation  associated with rapid ventricular rate.  He was asymptomatic and  spontaneously converted to normal sinus rhythm.  He has remained in normal  sinus rhythm since then.  I suspect etiology of the atrial fibrillation may  be the pulmonary embolus with resultant pulmonary hypertension and increased  right-sided right ventricular and right atrial pressures and subsequent  right atrial __________.  However, we also need to rule out underlying  structural heart disease.   RECOMMENDATIONS:  1. Agree with scheduled 2-D echocardiogram to assess left- and right-sided     cardiac chambers and function.  2. Continued anticoagulation as is being done for the pulmonary embolism for     a recommended INR of 2-3.  3. We will add low-dose diltiazem for rate control in the event of recurrent     atrial fibrillation.  This will be titrated according to his heart rate     and blood pressure.  4. The patient did have reportedly normal coronaries on catheterization 5-6     years ago and has not had any recurrent anginal symptoms, nor significant     cardiac risk factors  She will defer stress testing at this point,     however, we may want to consider this in the future.                                               Carole Binning, M.D. [facility]    MWP/MEDQ  D:  06/22/2003  T:  06/22/2003  Job:  [id number]   cc:   Shan Levans, M.D. [facility]   Titus Dubin. Alwyn Ren, M.D. [facility]

## 2010-07-01 NOTE — Op Note (Signed)
[place]  Patient:    Clarence Hopkins, Clarence Hopkins Visit Number: [id number] MRN: [id number]          Service Type: DSU Location: [facility] Attending Physician:  Consuello Bossier Dictated by:   Consuello Bossier., M.D. Proc. Date: 04/09/01 Admit Date:  04/09/2001                             Operative Report  PREOPERATIVE DIAGNOSIS:  Malignant melanoma, Clarks level 3, 0.9 mm Breslau depth, left earlobe.  POSTOPERATIVE DIAGNOSIS:  Malignant melanoma, Clarks level 3, 0.9 mm Breslau depth, left earlobe.  OPERATION PERFORMED:  Wide local excision of above with closure of resulting 2.5 cm in length ear defect.  SURGEON:  Consuello Bossier., M.D.  ANESTHESIA:  Xylocaine 2% with epinephrine  1:100,000.  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  The lesion measured approximately 1 cm in diameter or at least the desiccated area which had been previously treated by tangential excision and electrocauterization and occupied a good part of the earlobe itself.  It was elected to resect the entire earlobe extending up creating a defect in the lower part of the ear to get 3 or 4 mm margins.  The purpose of this biopsy was to determine whether the tangential biopsy showed the true circumstances of the melanoma.  At surgery, he was noted to have what appeared to be some extension of several paths which may have been lymphatics of what appeared to be melanocytic lines up into the ear itself whose depth was indeterminate.  This was separate from the specimen itself.  It was felt inadvisable to do anything further at the present time but to rather establish whether this indeed was melanoma at a greater depth in the  skin of the 0.9 mm thickness and also whether there was evidence of subcutaneous disease which would indicate a Clarks level 5 process.  Following this, the pathological determination of the depth of disease, the patient will be advised of recommendations.  The patient is scheduled to see  Dr. Francina Ames next week.  Following the removal of the ear lobe and some of the ear just above this, the wound was closed with interrupted and running #6-0 Prolene.  A light compressive dressing was applied.  The patient tolerated the procedure well, will return in approximatelyfive days for suture removal or before if any problems. INDICATIONS FOR PROCEDURE:  DESCRIPTION OF PROCEDURE: Dictated by:   Consuello Bossier., M.D. Attending Physician:  Consuello Bossier DD:  04/09/01 TD:  04/09/01 Job: [id number]

## 2010-07-01 NOTE — H&P (Signed)
NAME:  Clarence Hopkins, Clarence Hopkins NO.:  [id number]   MEDICAL RECORD NO.:  [id number]                   PATIENT TYPE:   LOCATION:                                       FACILITY:   PHYSICIAN:  Ollen Gross, M.D.                 DATE OF BIRTH:   DATE OF ADMISSION:  02/16/2003  DATE OF DISCHARGE:                                HISTORY & PHYSICAL   CHIEF COMPLAINT:  Left knee pain.   HISTORY OF PRESENT ILLNESS:  The patient is a 77 year old male who has been  seen in consultation by Dr. Ollen Gross for ongoing left knee pain.  He  was referred over by Dr. Lynnette Caffey.  The patient states that his knee has  been having recurrent swelling and increasing pain.  It started to interfere  with his daily activities and his active lifestyle.  He has undergone  several aspirations and cortisone injections, which have only helped  temporarily.  He was seen in the office where x-rays from November show only  slight medial joint space narrowing with no significant arthritis.  MRI did  show focal area in the medial tibial plateau.  He did not have obvious  severe end-stage arthritis, but pain was quite significant.  He wanted to  try nonoperative management at first and underwent injections, but only had  temporary relief again.  Therefore, after a long discussion, the patient has  decided that he would like to pursue knee replacement.  Risks and benefits  of this procedure were discussed with the patient and he elected to proceed.   ALLERGIES:  No known drug allergies.   CURRENT MEDICATIONS:  Thyroid medication.   PAST MEDICAL HISTORY:  1. Hypothyroidism.  2. Melanoma of the left ear.   PAST SURGICAL HISTORY:  1. Melanoma excision.  2. Thyroidectomy.  3. Knee arthroscopy.   SOCIAL HISTORY:  Married.  Nonsmoker.  Seldom, occasional intact of alcohol.  Has four children.   FAMILY HISTORY:  Father deceased with a history of Alzheimer's.  Mother  deceased with a  history of breast cancer.   REVIEW OF SYSTEMS:  GENERAL:  No fever, chills, or night sweats.  NEUROLOGIC:  No seizures, syncope, or paralysis.  RESPIRATORY:  No shortness  of breath.  No cough or hemoptysis.  CARDIOVASCULAR:  No chest pain, angina,  or orthopnea.  GI:  No nausea, vomiting, diarrhea, or constipation.  GU:  No  dysuria, hematuria, discharge.  MUSCULOSKELETAL:  Pertinent to left knee.   PHYSICAL EXAMINATION:  VITAL SIGNS:  Pulse 64, respirations 20, blood  pressure 134/84.  GENERAL:  The patient is a 77 year old white male, tall framed, well-  nourished individual in no acute distress.  He appears to be an excellent  historian.  He is alert and oriented and cooperative.  HEENT:  Normocephalic and atraumatic.  Pupils round and reactive.  He does  wear glasses.  Extraocular movements intact.  NECK:  Supple.  CHEST:  Clear.  HEART:  Regular rate and rhythm.  No murmurs.  S1 and S2 noted.  ABDOMEN:  Soft, nontender.  Bowel sounds present.  RECTAL/BREAST/GENITAL:  Not done.  No pertinent to present illness.  EXTREMITIES:  Significant to the left knee.  He does have a large effusion.  No erythema.  He is tender to palpation in the medial lateral joint lines.  His range of motion is 5 to 90 degrees.   IMPRESSION:  1. Osteoarthritis of left knee.  2. Hyperthyroidism.  3. History of melanoma, left ear.   PLAN:  The patient admitted to [facility] to undergo left total  knee arthroplasty.  The surgery will be performed by Dr. Ollen Gross.  Dr.  Marga Melnick is the patient's physician.  Dr. Alwyn Ren will be notified of  the room number on admission and be consulted if needed for any medical  assistance for the patient throughout the hospital course.     Alexzandrew L. Julien Girt, P.A.              Ollen Gross, M.D.    ALP/MEDQ  D:  02/20/2003  T:  02/20/2003  Job:  [id number]

## 2010-07-01 NOTE — Assessment & Plan Note (Signed)
[facility]                               PULMONARY OFFICE NOTE   KI, CORBO                     MRN:          [id number]  DATE:11/14/2005                            DOB:          [birth year]    [name] returns today in followup, 77 year old white male with history of  pulmonary embolism, deep venous thrombosis on a recurrent basis.  The  patient has documented need for lifelong anticoagulation, on Coumadin.  He  has done well since his last visit in April.  He has had no bleeding  difficulties, no evidence of recurrent thrombolic emboli.  His INR has been  satisfactory in the 2.5 range.  The patient maintains Coumadin 5 mg 5 days a  week,with the exception of 7.5 Mondays and Fridays.   PHYSICAL EXAMINATION:  Temp 97, blood pressure 130/80, pulse 58, saturation  96% on room air.  CHEST:  Showed diminished breath sounds with no evidence of wheeze or  rhonchi.  CARDIAC:  Exam showed a regular rate and rhythm without S3; normal S1, S2.  ABDOMEN:  Soft, nontender.  EXTREMITIES:  Showed no edema or clubbing.  SKIN:  Clear.  NEUROLOGIC:  Exam was intact.  HEENT:  Exam showed no jugular venous distention, no lymphadenopathy.  The  oropharynx is clear.  NECK:  Supple.   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   PLAN:  Maintain Coumadin for life at current dosing.  I am going to release  this patient back to Dr. Alwyn Ren for further followup.  We are available to  see this patient p.r.n. from a pulmonary standpoint.       Charlcie Cradle Delford Field, MD, FCCP      PEW/MedQ  DD:  11/15/2005  DT:  11/17/2005  Job #:  [id number]   cc:   Titus Dubin. Alwyn Ren, MD,FACP,FCCP

## 2010-07-01 NOTE — Discharge Summary (Signed)
NAME:  Clarence Hopkins, Clarence Hopkins NO.:  [id number]   MEDICAL RECORD NO.:  [id number]          PATIENT TYPE:  INP   LOCATION:  5036                         FACILITY:  [facility]   PHYSICIAN:  Shan Levans, M.D. LHCDATE OF BIRTH:  08/16/1930   DATE OF ADMISSION:  04/04/2005  DATE OF DISCHARGE:  04/07/2005                                 DISCHARGE SUMMARY   DISCHARGE DIAGNOSIS:  Acute pulmonary embolus.   HISTORY OF PRESENT ILLNESS:  Mr. Meir is a 77 year old white male who noted  acute onset of right-sided pleuritic chest pain this evening.  He presented  to the emergency room and was found to have evidence of acute pulmonary  embolus of the right upper lobe.  He had a previous history of pulmonary  embolism May 2005 following a left total knee replacement.  He was treated  with Coumadin at that time, treated only for 3 months, with resolution of  clot.  He was found to have protein C deficiency at that time.  Now he  presents with acute onset of right upper lobe pleuritic chest pain,  recurrent pulmonary embolism, and we are asked by the emergency room to  admit and follow him.   PAST MEDICAL HISTORY:  Significant for pulmonary embolism in 2005, melanoma  with resection, history of left total knee replacement, history of  hypothyroidism.   LABORATORY DATA:  A 12-lead EKG shows a normal sinus rhythm with a  ventricular rate of 76 beats per minute. A pH of 7.36, pCO2 of 45, bicarb of  25.9.  WBC 7.9, hemoglobin 14.3, hematocrit 41.5, platelets 187.  INR is  1.2, PTT is 34.  Lupus anticoagulant is 76.3.  Protein S is 113.  Protein C  is 69.  Anticardiolipin B is 23.  Lower extremity Doppler studies show  evidence of DVT in the posterior tibial vein only; the remaining vessels are  patent; no SVT or Bakers cyst noted.  CT scan demonstrated right upper lobe  pulmonary embolism.   HOSPITAL COURSE BY DISCHARGE DIAGNOSIS:  #1 - ACUTE PULMONARY EMBOLUS WITH A  DEEP VENOUS  THROMBOSIS.  He was admitted to the hospital and treated with  anticoagulation with Lovenox and Coumadin.  He reached maximal hospital  benefit by April 07, 2005.  He will be discharged home on a bridge of  Lovenox to Coumadin and follow up with the Coumadin clinic of his choice.   #2 - HISTORY OF MELANOMA RESECTION.  No change.   #3 - HISTORY OF LEFT TOTAL KNEE WITH REPLACEMENT.  No change.   #4 - HISTORY OF HYPOTHYROIDISM.   DISCHARGE MEDICATIONS:  1.  Lovenox 100 mg subcu two times a day.  2.  Coumadin 7.5 mg a day and 5 mg daily therefore.  3.  Vicodin one to two every 4 hours as needed for pain.   DIET:  No restrictions.   He is not to drive until the following 3 days.  He is to follow up with the  Coumadin clinic May 14, 2005.   DISPOSITION/CONDITION ON DISCHARGE:  Improved.  Brett Canales Minor, A.C.N.P. [facility]      Shan Levans, M.D. [facility]  Electronically Signed    SM/MEDQ  D:  06/15/2005  T:  06/15/2005  Job:  [id number]

## 2010-07-01 NOTE — H&P (Signed)
NAME:  Clarence, Hopkins                        ACCOUNT NO.:  [id number]   MEDICAL RECORD NO.:  [id number]                   PATIENT TYPE:  INP   LOCATION:  2011                                 FACILITY:  [facility]   PHYSICIAN:  Danice Goltz, M.D. [facility]            DATE OF BIRTH:  March 11, 1930   DATE OF ADMISSION:  06/19/2003  DATE OF DISCHARGE:                                HISTORY & PHYSICAL   REASON FOR ADMISSION:  Bilateral pulmonary emboli.   HISTORY OF PRESENT ILLNESS:  [name] is a 77 year old white male who  presents for the above.  The patient has a history of melanoma excised at  [facility] in the past with followups every 3 months.  He has chest CTs  done on a periodic basis.  On Monday, the 2nd of May, the patient went to  [facility] for a routine examination and a CT scan of the chest was done at that  time; this was not done on spiral protocol.  The patient states that he was  called from [facility] that there was a suspicion that there was a possible  pulmonary emboli.  The patient opted to come back to [facility] for further  evaluation and at that time, requested that this be done in [place] for  proximity of his work.  The patient called Dr. Ollen Gross, his  orthopedist, who subsequently called Dr. Shan Levans, and the patient was  evaluated today at the office for the above problem.  The patient apparently  gave Dr. Delford Field the description of maybe some increasing dyspnea over the  last several months, however, on my interview with him, he really minimizes  all of his symptomatology and denies any dyspnea, chest pain, cough or  hemoptysis.  The patient states that this was just an incidental finding.  He apparently had a CT scan done at [facility] on the 2nd of February that did not  show any abnormality, however, again, this was not a spiral CT.  The patient  did have a spiral CT done today at [facility] and this showed bilateral pulmonary  emboli.  He also did undergo bilateral  Dopplers of the lower extremities at  [facility] and reportedly, per the patient, this did not show any  evidence of clot.  The patient has had no nausea, vomiting; no fevers,  chills or sweats.   PAST MEDICAL HISTORY:  Past medical history is remarkable for:  1. Hypothyroidism since the 1970s for which he has been on Armour Thyroid.  2. He had a melanoma excised at [facility] -- this was on the right ear -- with     followup every 3 months.  3. He had a left knee replacement, February 03, 2003, by Dr. Lequita Halt for     which he was on Coumadin for several weeks.   CURRENT MEDICATIONS:  Current medications include:  1. Armour thyroid 2 grains 2 tablets Monday/Wednesday/Fridays and  1 tablet     Tuesday, Thursday, Saturday and Sunday.  2. The patient also takes Advil p.r.n.   SOCIAL HISTORY:  The patient is married, lives with his wife.  He is an Geneticist, molecular currently, owning a Chemical engineer.  He is a former  smoker, having quit 12 years ago and never smoked to excess, per his  history.  He is a social drinker.  His work is mostly desk-based work.  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  He served in [place] and the [place]  for a total of 18 months prior to completing his tour of duty in the [place]  this was from [phone].   FAMILY HISTORY:  Family history is noncontributory.  There is no history of  hypercoagulability in the family or easy bruising or bleeding.   REVIEW OF SYSTEMS:  Review of systems is as noted above.  The patient denies  any gastroesophageal reflux, again has had no chest pain and really  minimizes his dyspnea.   PHYSICAL EXAM:  GENERAL:  Physical exam shows a healthy-appearing 72-year-  old white male who appears much younger than his stated age.  He is in no  acute distress.  VITAL SIGNS:  Vitals are stable.  He is afebrile, vitals as noted on the  health history  assessment.  HEENT:  Examination  is unremarkable for age.  NECK:  Neck is supple.  No adenopathy noted.  No JVD.  There is no  thyromegaly.  LUNGS:  Lungs are clear to auscultation and percussion bilaterally.  CARDIAC:  Regular rate and rhythm.  No rubs, murmurs or gallops heard.  EXTREMITIES:  No cyanosis, clubbing or edema noted.  He does have  postoperative changes on the left knee.  NEUROLOGICAL:  Examination was grossly nonfocal.  GU AND RECTAL:  GU and rectal were not examined as this was not indicated  for the scope of the problem.   LABORATORY DATA:  We have reviewed the CT scan of the chest and this indeed  shows chronic-appearing bilateral pulmonary emboli.   IMPRESSION:  1. Bilateral pulmonary emboli appear to be acute on chronic.  2. Possible hypercoagulability state resulting from prior knee surgery and     immobility, however, the patient does have underlying history of     melanoma, so other potential hypercoagulable etiologies need to be     evaluated.  3. History of hypothyroidism.   PLAN:  1. Plan will be to admit the patient for heparin infusion.  We will cross     over to Coumadin after a 3-day period with a 3-day overlap prior to     discharge.  2. We will obtain a hypercoagulable panel, CBC with differential, CMET and     TSH, as well as a 12-lead EKG and chest x-ray.  3. Further plans pending patient's response to the above.                                                Danice Goltz, M.D. [facility]    LG/MEDQ  D:  06/19/2003  T:  06/20/2003  Job:  [id number]   cc:   Shan Levans, M.D. [facility]   Titus Dubin. Alwyn Ren, M.D. [facility]   Ollen Gross, M.D.  [facility]  Office  [address]  [address]  [place]  [place] [address]  Fax: [phone]

## 2010-07-01 NOTE — H&P (Signed)
NAME:  JOSTEN, WARMUTH NO.:  [id number]   MEDICAL RECORD NO.:  [id number]          PATIENT TYPE:  INP   LOCATION:  2623                         FACILITY:  [facility]   PHYSICIAN:  Shan Levans, M.D. LHCDATE OF BIRTH:  26-Oct-1930   DATE OF ADMISSION:  04/04/2005  DATE OF DISCHARGE:                                HISTORY & PHYSICAL   CHIEF COMPLAINT:  Acute pulmonary embolism.   HISTORY OF PRESENT ILLNESS:  This is a 77 year old white male who noted the  acute onset of right-sided pleuritic chest pain this evening.  Came to the  emergency room.  Found to have acute pulmonary embolism in the right upper  lobe.  He had a previous history of pulmonary embolism in May 2005 following  left total knee replacement treatment.  He was treated with Coumadin at that  time, was treated for only three months with resolution of the clot.  He did  have a protein C deficiency at that time.  Now has acute onset right upper  lobe pleuritic chest pain and has recurrent pulmonary embolism and we are  asked by the emergency room to admit.   PAST MEDICAL HISTORY:  1.  History of pulmonary embolism in 2005.  2.  History of melanoma with resection.  3.  History of left total knee replacement.  4.  History of hypothyroidism.   MEDICATIONS PRIOR TO ADMISSION:  Thyroid pill daily, glucosamine daily.   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  Married.  Has children.  Does not smoke.  Has minimal social alcohol use.   FAMILY HISTORY:  Noncontributory.   REVIEW OF SYSTEMS:  Noncontributory.   PHYSICAL EXAMINATION:  VITAL SIGNS:  Temperature 98, blood pressure 150/85,  pulse 82, respirations 18.  CHEST:  Clear.  No wheeze.  CARDIAC:  Regular rate and rhythm without S3.  Normal S1, S2.  No murmur.  ABDOMEN:  Soft, nontender.  There is no organomegaly.  EXTREMITIES:  No clubbing or edema.  SKIN:  Clear.  NEUROLOGIC:  Intact.  HEENT:  No jugular venous distention,  no lymphadenopathy.  Oropharynx clear.  NECK:  Supple.   LABORATORY DATA:  CT scan of the chest showed a pulmonary embolism in the  right upper lobe branch.  No other emboli seen.  All other laboratories are  pending at time of this dictation.   IMPRESSION:  Right upper lobe pulmonary embolism.  This is the second  occurrence of this.  For this the patient will receive Lovenox, will be  considered for the __________  trial versus standard Coumadin therapy.  He  will need Coumadin for life in this situation.  Will admit to a telemetry  bed, recheck hypercoagulability panel.      Shan Levans, M.D. [facility]  Electronically Signed     PW/MEDQ  D:  04/05/2005  T:  04/05/2005  Job:  [id number]   cc:   Charlies Constable, M.D. [facility]  [address]  [address]  [place]  [place] [address]   Titus Dubin. Alwyn Ren, M.D. [facility]  [address] [place]  [place] [address]

## 2010-07-01 NOTE — Discharge Summary (Signed)
NAME:  Clarence Hopkins, Clarence Hopkins                        ACCOUNT NO.:  [id number]   MEDICAL RECORD NO.:  [id number]                   PATIENT TYPE:  INP   LOCATION:  0466                                 [facility]AC[facility]  [facility]   PHYSICIAN:  Ollen Gross, M.D.                 DATE OF BIRTH:  [birth year]   DATE OF ADMISSION:  02/11/2003  DATE OF DISCHARGE:  02/16/2003                                 DISCHARGE SUMMARY   ADMISSION DIAGNOSES:  1. Left knee osteoarthritis.  2. Hypothyroidism.  3. History of melanoma.   DISCHARGE DIAGNOSES:  1. Left knee osteoarthritis.  2. Hypothyroidism.  3. History of melanoma.  4. Status post left total knee arthroplasty.   OPERATION:  Left total knee arthroplasty, surgeon Ollen Gross, M.D.,  assistant Alexzandrew L. Perkins, P.A. under a general anesthetic with a  postoperative Marcaine pain pump.   BRIEF HISTORY:  [name] is a 77 year old male who has been followed  for ongoing left knee difficulties in relation to osteoarthritis.  He has  undergone conservative therapy which has now failed. The risks and benefits  are discussed for a total knee arthroplasty and at this time conservative  measures have failed and he wished to proceed.   HOSPITAL COURSE:  The patient was admitted, underwent the above named  procedure and tolerated this well. All appropriate IV antibiotics and  analgesics were utilized. Postoperatively, he was placed on total knee  replacement protocol and __________ DVT and PE  prophylaxis.  He was  weightbearing as tolerated with CPM use.  Postoperative day two, Hemovac was  removed as well as Marcaine pain pump.  Postoperative day two, he was weaned  from IV analgesics and IV fluids.  He began working with physical therapy.  Postoperatively he remained neurovascularly intact.  He worked well with  physical therapy and progressed to meet all discharge goals. By  postoperative day five, he was afebrile, vital signs were stable. He  had  remained hemodynamically stable throughout the hospitalization.  His motor  was intact, he had met all therapy and discharge goals.  This time he was  stable for discharge to home with home health PT, RN arranged.   LABORATORY DATA:  Admission hemoglobin 15.1, postoperatively 12.9, 11.8 and  11.6.  Pro times and INR monitored by pharmacy on Coumadin protocol.  Chemistry preoperatively normal.  Postoperatively mild elevation in glucose  is noted, urinalysis negative, blood type A+.  EKG shows a sinus bradycardia  with premature atrial complexes otherwise normal.  Chest x-ray not in chart  at time of dictation.   CONDITION ON DISCHARGE:  Stable and improved.   DISCHARGE MEDICATIONS AND PLANS:  The patient is being discharged on  Coumadin, Percocet, and Robaxin, prescriptions provided. He will followup in  two weeks, call for a time.  Home health PT, RN are arranged.  __________  home medications and home diet.   CONDITION ON  DISCHARGE:  Stable and improved.     Tracy A. Shuford, P.A.-C.                 Ollen Gross, M.D.    TAS/MEDQ  D:  04/02/2003  T:  04/02/2003  Job:  [id number]

## 2010-07-01 NOTE — Op Note (Signed)
NAME:  Clarence Hopkins, Clarence Hopkins                        ACCOUNT NO.:  [id number]   MEDICAL RECORD NO.:  [id number]                   PATIENT TYPE:  INP   LOCATION:  0466                                 FACILITY:  [facility]   PHYSICIAN:  Ollen Gross, M.D.                 DATE OF BIRTH:  January 19, 1931   DATE OF PROCEDURE:  02/11/2003  DATE OF DISCHARGE:                                 OPERATIVE REPORT   PREOPERATIVE DIAGNOSIS:  Osteoarthritis left knee.   POSTOPERATIVE DIAGNOSIS:  Osteoarthritis left knee.   PROCEDURE:  Left total knee arthroplasty.   SURGEON:  Gus Rankin. Aluisio, M.D.   ASSISTANT:  Alexzandrew L. Julien Girt, P.A.-C   ANESTHESIA:  General.   ESTIMATED BLOOD LOSS:  Minimal.   DRAINS:  Hemovac x1.   TOURNIQUET TIME:  61 minutes at 300 mmHg.   COMPLICATIONS:  None.   CONDITION:  Stable to recovery.   BRIEF CLINICAL NOTE:  Clarence Hopkins is a 77 year old male who developed knee pain  approximately about six months ago. He was initially treated with  arthroscopy and had large chondral defects. He has had recurrent loose  bodies and large recurrent effusions.  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  He presents now for left total knee arthroplasty  secondary to failure of nonoperative management including injections.   DESCRIPTION OF PROCEDURE:  After successful administration of general  anesthetic, a tourniquet was placed high on the left thigh and left lower  extremity prepped and draped in the usual sterile fashion.  Extremity was  wrapped in an Esmarch, knee flexed, tourniquet inflated to 300 mmHg.   A midline incision was made with a 10 blade through the subcutaneous to the  level of the extensor mechanism. A fresh blade was used to make a medial  parapatellar arthrotomy and then the soft tissue over the proximal medial  tibia subperiosteally elevated to the joint line with a knife into the  semimembranosus bursa with a curved osteotome.  He had a  tremendous amount  of synovitis present throughout the joint.  We everted the patella, flexed  the knee to 90 degrees and removed the ACL and PCL.  He had bone on bone  change in the medial compartment as well as trochlear chondral defect which  was down to bone and was about 3 x 3 cm in size.  Once the ACL and PCL were  removed, the drill was used to create a starting hole in the distal femur  and canal was irrigated.  A 5 degree left valgus alignment guide was placed  and referencing off the posterior condyles, rotations marked and the block  pinned to remove 10 mm off the distal femur. Distal femoral resection was  made with an oscillating saw.  A sizing block is placed and a size 5 is most  appropriate. Rotations marked off the epicondylar axis.  The AP block is  placed and then the anterior and posterior cuts made for size.   The tibia is subluxed forward and the menisci removed. The extramedullary  tibial alignment guide is placed referencing proximally at the medial aspect  of the tibial tubercle and distally along the second metatarsal axis of the  tibial crest.  The block is pinned to remove 10 mm off the nondeficient  lateral side.  Tibial resection is made with an oscillating saw.  A size 5  is also the most appropriate tibia and the proximal tibia is then prepared  with the modular drill and keel punch. Femoral preparation is completed with  the intercondylar and chamfer cuts.   A trial size 5 posterior stabilized femur, size 5 mobile bearing tibial tray  with a 10 mm posterior stabilized rotating platform insert is placed.  Full  extension is achieved with excellent varus and valgus balance throughout.  The patella is then everted, thickness measured to be 27 mm free hand  resection taken to 15 mm, a 41 template is placed, lug holes are drilled,  and trial patella is placed. The osteophytes are then removed off the  posterior femur with the trials in place.  All trials are  removed and the  cut bone surfaces are prepared with pulsatile lavage.  The cement is mixed  and once ready for implantation, the size 5 mobile bearing tibial tray, size  5 posterior stabilized femur and 41 patella are cemented into place. The  patella is held with a clamp. The trial 10 mm insert is placed, knee held in  full extension and all extruded cement is removed.  Once the cement is fully  hardened then the permanent 10 mm posterior stabilized rotating platform is  insert is placed.  A complete synovectomy had been performed prior to this.  The wound is then copiously irrigated with antibiotic solution and extensor  mechanism closed over a Hemovac drain with interrupted #1 PDS.  Flexion  against gravity is 135 degrees. The tourniquet is then released with a total  time of 61 minutes. The subcu tissues are closed with interrupted 2-0  Vicryl, subcuticular with running 4-0 Monocryl. The incision is cleaned and  dried and Steri-Strips and a bulky sterile dressing applied. He had the  catheter placed with a Marcaine pain pump.  A bulky dressing was then  applied and then he was placed into a knee immobilizer, awakened and  transported to the recovery room in stable condition.                                               Ollen Gross, M.D.    FA/MEDQ  D:  02/11/2003  T:  02/11/2003  Job:  [id number]

## 2010-07-01 NOTE — Op Note (Signed)
NAME:  Clarence Hopkins, Clarence Hopkins                            ACCOUNT NO.:  [id number]   MEDICAL RECORD NO.:  [id number]                   PATIENT TYPE:  AMB   LOCATION:  DSC                                  FACILITY:  [facility]   PHYSICIAN:  Robert A. Thurston Hole, M.D.              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   POSTOPERATIVE DIAGNOSIS:  Left knee medial and lateral meniscal tears with  chondromalacia and synovitis.   PROCEDURE:  1. Left knee examination under anesthesia followed by arthroscopic partial     medial and lateral meniscectomies.  2. Left knee chondroplasty with partial synovectomy.   SURGEON:  Elana Alm. Thurston Hole, M.D.   ASSISTANT:  Julien Girt, P.A.   ANESTHESIA:  Local and MAC.   OPERATIVE TIME:  30 minutes.   COMPLICATIONS:  None.   INDICATIONS:  [name] is a 77 year old gentleman who has had significant  left knee pain for the past 3-4 months increasing in nature with signs and  symptoms and MRI documenting medial meniscus tear and chondromalacia who has  failed conservative care and is now to undergo arthroscopy.   DESCRIPTION OF PROCEDURE:  [name] was brought to the operating room on  September 29, 2002, after a knee block had been placed in the holding room.  He  was placed on the operating table in the supine position.  His left knee was  examined under anesthesia.  Range of motion 0-125 degrees, 1-2+ crepitation.  Knee stable ligamentous exam with normal patella tracking.  The left leg was  prepped and using sterile DuraPrep and draped using sterile technique.  Originally through an inferolateral portal the arthroscope with a pump  attached was placed through an inferior medial portal.  An arthroscopic  probe was placed.  On initial inspection of the medial compartment, he was  found to  have 50% to 60% grade 3 chondromalacia which was thoroughly  debrided.  The medial meniscus showed tearing of the medial posterior and  anterior horn of which 50-60% was resected back to a stable rim.  The  intracondylar notch was thickened.  The anterior and posterior cruciate  ligaments were normal.  The lateral compartment was inspected.  The  articular cartilage showed 50% grade 3 chondromalacia which was debrided.  The lateral meniscus tearing posterior and lateral horn 30% which was  resected back to a stable rim.  The patellofemoral joint was inspected.  He  was found to have 50-60% grade 3 chondromalacia on the patella and femoral  groove which was thoroughly debrided.  The patella tracked normally.  Moderate synovitis in the medial and lateral gutters were debrided.  Otherwise, they are free of pathology.  After this was done, it was felt  that  all pathology had been satisfactorily addressed.  The instruments were  removed.  Portals were closed with 3-0 nylon suture and injected with 0.25%  Marcaine with epinephrine and 4 mg of morphine.  Sterile dressings applied.  The patient was awakened and taken to the recovery room in stable condition.   FOLLOW UP CARE:  [name] will be followed as an outpatient on Vicodin and  Naprosyn.  I will see him back in the office in 1 week for sutures out an  followup.                                               Robert A. Thurston Hole, M.D.    RAW/MEDQ  D:  09/29/2002  T:  09/29/2002  Job:  [id number]

## 2010-07-01 NOTE — Discharge Summary (Signed)
NAME:  Clarence Hopkins, Clarence Hopkins                        ACCOUNT NO.:  [id number]   MEDICAL RECORD NO.:  [id number]                   PATIENT TYPE:  INP   LOCATION:  2011                                 FACILITY:  [facility]   PHYSICIAN:  Shan Levans, M.D. [facility]            DATE OF BIRTH:  [birth year]   DATE OF ADMISSION:  06/19/2003  DATE OF DISCHARGE:  06/23/2003                                 DISCHARGE SUMMARY   DISCHARGE DIAGNOSES:  1. Pulmonary embolism.  2. Paroxysmal atrial fibrillation.   HISTORY OF PRESENT ILLNESS:  The patient is a 77 year old white male who  presented with complaints of shortness of breath.  He had a CT scan which  demonstrated a pulmonary emboli.  He also has a history of melanoma excised  at [facility] in the past and is followed up every three months.  For  that reason, he has CT's done on a periodic basis with one done on May 2 and  went to [facility] for routine examination.  A call from [facility] was that there was  suspicion for a possible pulmonary emboli.  The patient opted to come to  [facility] for further evaluation at that time.  The patient called Dr.  Ollen Gross, his orthopedist, who subsequently called Dr. Shan Levans.  He then had a repeat CT done which did demonstrate bilateral pulmonary  emboli.  For that reason, he was admitted for further evaluation and  treatment.   PAST MEDICAL HISTORY:  1. Hypothyroidism.  2. History of melanoma excision.  3. Left total knee replacement on February 03, 2003 by Dr. Lequita Halt.  Of     note, he had been on Coumadin for several weeks at that time.   LABORATORY DATA:  A 2D echo demonstrates a left ventricular ejection  fraction of 65%, left atrial size upper limits of normal, overall left  ventricular function is good and carbine seems normal.  A 12 lead EKG showed  sinus bradycardia with nonspecific interventricular conduction delay.  Chest  x-ray showed no active air space disease.   WBC 4.2, hemoglobin 13.4,  hematocrit 40.0, platelets 170,000.  INR was 1.3.  Factor 5 Leiden was negative.  Lupus anticoagulant was 200.  Protein C was  73, protein S total was 100, protein S function was 116, protein C function  was 124.  Sodium 136, potassium 3.9, chloride 100, C02 29, glucose 106, BUN  13, creatinine 1.2.  Homocysteine was 10.97.  TSH was 0.648.   HOSPITAL COURSE BY DISCHARGE DIAGNOSES:  1. BILATERAL PULMONARY EMBOLI, NON-SYMPTOMATIC.  He was admitted and started     on initially on a heparin bolus and then subsequently changed to Lovenox.     He was also placed on Coumadin and was continued on Lovenox until he was     therapeutic with a therapeutic overlap with his Coumadin.  2. PAROXYSMAL ATRIAL FIBRILLATION.  He developed paroxysmal atrial  fibrillation.  He had a cardiac 2D echo as noted.  He did convert to a     sinus rhythm.  He will be maintained on Cardizem on an outpatient basis.  3. HYPOTHYROIDISM.  He was continued on his hypothyroid medication.   DISCHARGE MEDICATIONS:  1. Lovenox 150 mg once a day.  2. Coumadin 7.5 mg a day.  3. Cardizem CD 120 mg daily.  4. Thyroid medication once a day.   FOLLOW UP:  1. He will have a follow-up appointment in the Coumadin clinic on Jun 25, 2003 at 1:30 p.m.  2. Follow-up with Dr. Shan Levans on Jul 10, 2003 at 2 p.m.  3. Follow-up with Dr. Charlies Constable on August 04, 2003 at 3:30 p.m.   DIET:  As tolerated.   SPECIAL INSTRUCTIONS:  No heavy or manual labor or heavy activity until off  of Lovenox.   DISPOSITION AND CONDITION ON DISCHARGE:  Improved.      Brett Canales Minor, A.C.N.P. [facility]                 Shan Levans, M.D. [facility]    SM/MEDQ  D:  07/07/2003  T:  07/07/2003  Job:  [id number]   cc:   Ollen Gross, M.D.  [facility]  [address]  [address]  [place]  [place] [address]  Fax: [phone]

## 2010-07-01 NOTE — Op Note (Signed)
NAME:  Clarence Hopkins, Clarence Hopkins                        ACCOUNT NO.:  [id number]   MEDICAL RECORD NO.:  [id number]                   PATIENT TYPE:  EMS   LOCATION:  ED                                   FACILITY:  [facility]   PHYSICIAN:  Currie Paris, M.D.           DATE OF BIRTH:  03-24-1930   DATE OF PROCEDURE:  04/11/2003  DATE OF DISCHARGE:                                 OPERATIVE REPORT   CHIEF COMPLAINT:  Abscess of back.   HISTORY OF PRESENT ILLNESS:  Clarence Hopkins is a 77 year old gentleman  who has  had a small, soft mass in the lower to mid back slightly to the left of the  midline  for many years, but it has recently gotten much more large. It has  become painful over the last several days and has developed some redness. He  actually scheduled in our office this coming week for evaluation, but  because of increasing  pain came to the emergency room tonight. The patient  is otherwise in generally good health. He just had knee replacement surgery.   PHYSICAL EXAMINATION:  The patient has a fluctuant mass on the back which  measures approximately 5.5 cm. It is reddened on the left lateral side of  it. The right side is red. It is tender throughout. There is a little edema  overlying it.   IMPRESSION:  Inflamed, infected epidermoid cyst which developed an abscess.   PLAN:  I discussed incision and drainage with the patient and told him that  we could do this with local anesthesia. We will need to pack it and let it  heal from the inside out. He may have a residual abscess or residual  cyst  that would need subsequent evaluation  and surgical excision. At the present  time, given its size and nature, I did not think we could really excise the  whole thing.   DESCRIPTION OF PROCEDURE:  The patient agreed to proceed and the area was  then anesthetized with about 15 mL of a combination of 1% Xylocaine with  epinephrine and 0.5% plain Marcaine mixed equally. After giving it several  minutes to soak in, I made an elliptical incision, taking an ellipse of skin  out about 2/3rds of the length of the cavity.   The lateral  half was frankly purulent  and cultured. The medial half of the  epidermoid cyst continued to be more cystic material. It was grumous. I  manipulated most of that out and some of the wall, but laterally the wall  was still  attached and we did not have adequate anesthesia to really excise  much more of that today.   Everything appeared  to be dry so it was packed. I will put him on some  Keflex 500 t.i.d. He has got some Vicodin at home which he will take for  pain. We gave him 1 here prior to discharge. He  will call back and come in  the office on Monday for follow up and then keep his scheduled appointment  on Wednesday for another follow up visit.                                               Currie Paris, M.D.    CJS/MEDQ  D:  04/11/2003  T:  04/11/2003  Job:  [id number]

## 2010-07-11 ENCOUNTER — Other Ambulatory Visit: Payer: Self-pay | Admitting: Cardiology

## 2010-07-12 ENCOUNTER — Ambulatory Visit (INDEPENDENT_AMBULATORY_CARE_PROVIDER_SITE_OTHER): Payer: Medicare Other | Admitting: *Deleted

## 2010-07-12 DIAGNOSIS — Z86718 Personal history of other venous thrombosis and embolism: Secondary | ICD-10-CM

## 2010-07-12 DIAGNOSIS — I2699 Other pulmonary embolism without acute cor pulmonale: Secondary | ICD-10-CM

## 2010-07-12 DIAGNOSIS — I82409 Acute embolism and thrombosis of unspecified deep veins of unspecified lower extremity: Secondary | ICD-10-CM

## 2010-08-08 ENCOUNTER — Ambulatory Visit (INDEPENDENT_AMBULATORY_CARE_PROVIDER_SITE_OTHER): Payer: Medicare Other | Admitting: *Deleted

## 2010-08-08 DIAGNOSIS — Z86718 Personal history of other venous thrombosis and embolism: Secondary | ICD-10-CM

## 2010-08-08 DIAGNOSIS — I2699 Other pulmonary embolism without acute cor pulmonale: Secondary | ICD-10-CM

## 2010-08-08 DIAGNOSIS — I82409 Acute embolism and thrombosis of unspecified deep veins of unspecified lower extremity: Secondary | ICD-10-CM

## 2010-09-05 ENCOUNTER — Ambulatory Visit (INDEPENDENT_AMBULATORY_CARE_PROVIDER_SITE_OTHER): Payer: Medicare Other | Admitting: *Deleted

## 2010-09-05 DIAGNOSIS — I2699 Other pulmonary embolism without acute cor pulmonale: Secondary | ICD-10-CM

## 2010-09-05 DIAGNOSIS — Z86718 Personal history of other venous thrombosis and embolism: Secondary | ICD-10-CM

## 2010-09-05 DIAGNOSIS — I82409 Acute embolism and thrombosis of unspecified deep veins of unspecified lower extremity: Secondary | ICD-10-CM

## 2010-09-05 LAB — POCT INR: INR: 2.4

## 2010-10-04 ENCOUNTER — Ambulatory Visit (INDEPENDENT_AMBULATORY_CARE_PROVIDER_SITE_OTHER): Payer: Medicare Other | Admitting: *Deleted

## 2010-10-04 DIAGNOSIS — Z86718 Personal history of other venous thrombosis and embolism: Secondary | ICD-10-CM

## 2010-10-04 DIAGNOSIS — I82409 Acute embolism and thrombosis of unspecified deep veins of unspecified lower extremity: Secondary | ICD-10-CM

## 2010-10-04 DIAGNOSIS — I2699 Other pulmonary embolism without acute cor pulmonale: Secondary | ICD-10-CM

## 2010-10-04 LAB — POCT INR: INR: 2.2

## 2010-11-02 ENCOUNTER — Ambulatory Visit (INDEPENDENT_AMBULATORY_CARE_PROVIDER_SITE_OTHER): Payer: Medicare Other | Admitting: *Deleted

## 2010-11-02 DIAGNOSIS — I2699 Other pulmonary embolism without acute cor pulmonale: Secondary | ICD-10-CM

## 2010-11-02 DIAGNOSIS — Z86718 Personal history of other venous thrombosis and embolism: Secondary | ICD-10-CM

## 2010-11-02 DIAGNOSIS — I82409 Acute embolism and thrombosis of unspecified deep veins of unspecified lower extremity: Secondary | ICD-10-CM

## 2010-11-02 LAB — CBC
Hemoglobin: 14.4
RBC: 5.1

## 2010-11-02 LAB — APTT: aPTT: 33

## 2010-11-02 LAB — PROTIME-INR
INR: 1.1
INR: 1.2
Prothrombin Time: 15.7 — ABNORMAL HIGH

## 2010-11-02 LAB — BASIC METABOLIC PANEL
Calcium: 8.8
GFR calc Af Amer: >60
GFR calc non Af Amer: >60
Sodium: 136

## 2010-11-02 LAB — POCT INR: INR: 2.3

## 2010-12-05 ENCOUNTER — Encounter: Payer: Medicare Other | Admitting: *Deleted

## 2010-12-05 ENCOUNTER — Other Ambulatory Visit: Payer: Self-pay | Admitting: Dermatology

## 2010-12-07 ENCOUNTER — Encounter: Payer: Medicare Other | Admitting: *Deleted

## 2010-12-14 ENCOUNTER — Ambulatory Visit (INDEPENDENT_AMBULATORY_CARE_PROVIDER_SITE_OTHER): Payer: Medicare Other | Admitting: *Deleted

## 2010-12-14 ENCOUNTER — Other Ambulatory Visit: Payer: Self-pay | Admitting: Pharmacist

## 2010-12-14 ENCOUNTER — Other Ambulatory Visit: Payer: Self-pay | Admitting: Cardiology

## 2010-12-14 DIAGNOSIS — I82409 Acute embolism and thrombosis of unspecified deep veins of unspecified lower extremity: Secondary | ICD-10-CM

## 2010-12-14 DIAGNOSIS — I2699 Other pulmonary embolism without acute cor pulmonale: Secondary | ICD-10-CM

## 2010-12-14 DIAGNOSIS — Z7901 Long term (current) use of anticoagulants: Secondary | ICD-10-CM

## 2010-12-14 DIAGNOSIS — Z86718 Personal history of other venous thrombosis and embolism: Secondary | ICD-10-CM

## 2010-12-14 LAB — POCT INR: INR: 2.4

## 2010-12-14 MED ORDER — WARFARIN SODIUM 5 MG PO TABS: ORAL_TABLET | ORAL | Status: DC

## 2011-01-23 ENCOUNTER — Ambulatory Visit (INDEPENDENT_AMBULATORY_CARE_PROVIDER_SITE_OTHER): Payer: Medicare Other | Admitting: *Deleted

## 2011-01-23 DIAGNOSIS — Z7901 Long term (current) use of anticoagulants: Secondary | ICD-10-CM

## 2011-01-23 DIAGNOSIS — I82409 Acute embolism and thrombosis of unspecified deep veins of unspecified lower extremity: Secondary | ICD-10-CM

## 2011-01-23 DIAGNOSIS — I2699 Other pulmonary embolism without acute cor pulmonale: Secondary | ICD-10-CM

## 2011-01-23 DIAGNOSIS — Z86718 Personal history of other venous thrombosis and embolism: Secondary | ICD-10-CM

## 2011-02-03 ENCOUNTER — Encounter: Payer: Self-pay | Admitting: Internal Medicine

## 2011-02-08 ENCOUNTER — Ambulatory Visit (INDEPENDENT_AMBULATORY_CARE_PROVIDER_SITE_OTHER): Payer: Medicare Other | Admitting: Internal Medicine

## 2011-02-08 ENCOUNTER — Encounter: Payer: Self-pay | Admitting: Internal Medicine

## 2011-02-08 VITALS — BP 118/80 | HR 66 | Temp 97.6°F | Ht 75.0 in | Wt 209.0 lb

## 2011-02-08 DIAGNOSIS — E039 Hypothyroidism, unspecified: Secondary | ICD-10-CM

## 2011-02-08 DIAGNOSIS — Z0000 Encounter for general adult medical examination without abnormal findings: Secondary | ICD-10-CM

## 2011-02-08 MED ORDER — THYROID 120 MG PO TABS: 120.0000 mg | ORAL_TABLET | Freq: Every day | ORAL | Status: DC

## 2011-02-08 NOTE — Progress Notes (Signed)
Subjective:    Patient ID: Clarence Hopkins, male    DOB: Jul 27, 1930, 77 y.o.   MRN: [id number]  HPI Medicare Wellness Visit:  The following psychosocial & medical history were reviewed as required by Medicare.   Social history: caffeine: 1 coffee/ day , alcohol:  2 0z / day ,  tobacco use : quit 1995  & exercise : daily as stationary bike & walking .   Home & personal  safety / fall risk: no issues, activities of daily living: no limitations , seatbelt use : yes , and smoke alarm employment : yes .  Power of Attorney/Living Will status : in place  Vision ( as recorded per Nurse) & Hearing  evaluation :  Last Ophth exam 10/12: negative. Whisper heard @ 6 ft. Orientation :oriented x 3 , memory & recall :good , spelling  testing: good ,and mood & affect : normal .   Travel history : last 2002 [place] cruise , immunization status :up to date  , transfusion history:  no, and preventive health surveillance ( colonoscopies, BMD , etc as per protocol/ [facility]): colonoscopy doen 2000, Dental care:  Every 6 months . Chart reviewed &  Updated. Active issues reviewed & addressed.       Review of Systems Thyroid function monitor  Medications status(change in dose/brand/mode of administration): no ; on Armour thyroid 2 on M, W & & F ; 1 all other days Constitutional: Weight change: down 20 # on purpose; Fatigue:no; Sleep pattern:no; Appetite:good  Visual change(blurred/diplopia/visual loss):no Hoarseness:no; Swallowing issues:no Cardiovascular: Palpitations:no; Racing:no; Irregularity:no GI: Constipation:no; Diarrhea:no Derm: Change in nails/hair/skin:no Neuro: Numbness/tingling:no ; Tremor:no Psych: Anxiety:no; Depression:no Endo: Temperature intolerance: Heat:no; Cold:no       Objective:   Physical Exam Gen.: Healthy and well-nourished in appearance. Alert, appropriate and cooperative throughout exam. Head: Normocephalic without obvious abnormalities  Eyes: No corneal or conjunctival  inflammation noted.  Extraocular motion intact.  Ears: External  ear exam reveals l ear post op deformity. Canals clear .TMs normal.  Nose: External nasal exam reveals no deformity. Nasal mucosa are pink and moist. No lesions or exudates noted.   Mouth: Oral mucosa and oropharynx reveal no lesions or exudates. Teeth in good repair. Neck: No deformities, masses, or tenderness noted. Range of motion slightly reduced. Thyroid absent. Lungs: Normal respiratory effort; chest expands symmetrically. Lungs are clear to auscultation without rales, wheezes, or increased work of breathing. Heart: Normal rate and rhythm. Normal S1 and S2. No gallop, click, or rub. S 4 with slurring; no murmur. Abdomen: Bowel sounds normal; abdomen soft and nontender. No masses, organomegaly or hernias noted. Genitalia/ DRE : [facility]   .                                                                                   Musculoskeletal/extremities: minor lordosis  noted of  the thoracic  spine. No clubbing, cyanosis, edema, or deformity noted. Range of motion  normal .Tone & strength  normal.Joints normal. Nail health  good. Vascular: Carotid, radial artery, dorsalis pedis and  posterior tibial pulses are full and equal. No bruits present. Neurologic: Alert and oriented x3. Deep tendon reflexes symmetrical and normal.  Skin: Intact without suspicious lesions or rashes. Lymph: No cervical, axillary lymphadenopathy present. Psych: Mood and affect are normal. Normally interactive                                                                                         Assessment & Plan:  #1 Medicare Wellness Exam; criteria met ; data entered #2 Problem List reviewed ; Assessment/ Recommendations made Plan: see Orders

## 2011-02-08 NOTE — Assessment & Plan Note (Signed)
TSH needed  to assess the present dose of Armour thyroid.

## 2011-02-08 NOTE — Patient Instructions (Signed)
Please complete stool cards .Marland KitchenShare results with all MDs seen

## 2011-03-06 ENCOUNTER — Ambulatory Visit (INDEPENDENT_AMBULATORY_CARE_PROVIDER_SITE_OTHER): Payer: Medicare Other | Admitting: *Deleted

## 2011-03-06 DIAGNOSIS — I82409 Acute embolism and thrombosis of unspecified deep veins of unspecified lower extremity: Secondary | ICD-10-CM | POA: Diagnosis not present

## 2011-03-06 DIAGNOSIS — Z86718 Personal history of other venous thrombosis and embolism: Secondary | ICD-10-CM | POA: Diagnosis not present

## 2011-03-06 DIAGNOSIS — I2699 Other pulmonary embolism without acute cor pulmonale: Secondary | ICD-10-CM | POA: Diagnosis not present

## 2011-03-06 DIAGNOSIS — Z7901 Long term (current) use of anticoagulants: Secondary | ICD-10-CM | POA: Diagnosis not present

## 2011-03-27 ENCOUNTER — Other Ambulatory Visit: Payer: Self-pay | Admitting: Dermatology

## 2011-03-27 DIAGNOSIS — C44711 Basal cell carcinoma of skin of unspecified lower limb, including hip: Secondary | ICD-10-CM | POA: Diagnosis not present

## 2011-03-27 DIAGNOSIS — C44611 Basal cell carcinoma of skin of unspecified upper limb, including shoulder: Secondary | ICD-10-CM | POA: Diagnosis not present

## 2011-03-27 DIAGNOSIS — Z85828 Personal history of other malignant neoplasm of skin: Secondary | ICD-10-CM | POA: Diagnosis not present

## 2011-03-27 DIAGNOSIS — D239 Other benign neoplasm of skin, unspecified: Secondary | ICD-10-CM | POA: Diagnosis not present

## 2011-03-27 DIAGNOSIS — C44519 Basal cell carcinoma of skin of other part of trunk: Secondary | ICD-10-CM | POA: Diagnosis not present

## 2011-03-27 DIAGNOSIS — D0420 Carcinoma in situ of skin of unspecified ear and external auricular canal: Secondary | ICD-10-CM | POA: Diagnosis not present

## 2011-03-27 DIAGNOSIS — L570 Actinic keratosis: Secondary | ICD-10-CM | POA: Diagnosis not present

## 2011-03-27 DIAGNOSIS — Z85820 Personal history of malignant melanoma of skin: Secondary | ICD-10-CM | POA: Diagnosis not present

## 2011-03-27 DIAGNOSIS — L821 Other seborrheic keratosis: Secondary | ICD-10-CM | POA: Diagnosis not present

## 2011-03-27 DIAGNOSIS — D485 Neoplasm of uncertain behavior of skin: Secondary | ICD-10-CM | POA: Diagnosis not present

## 2011-04-05 DIAGNOSIS — M19049 Primary osteoarthritis, unspecified hand: Secondary | ICD-10-CM | POA: Diagnosis not present

## 2011-04-17 ENCOUNTER — Ambulatory Visit (INDEPENDENT_AMBULATORY_CARE_PROVIDER_SITE_OTHER): Payer: Medicare Other

## 2011-04-17 DIAGNOSIS — Z86718 Personal history of other venous thrombosis and embolism: Secondary | ICD-10-CM

## 2011-04-17 DIAGNOSIS — Z7901 Long term (current) use of anticoagulants: Secondary | ICD-10-CM | POA: Diagnosis not present

## 2011-04-17 LAB — POCT INR: INR: 2.1

## 2011-04-26 DIAGNOSIS — C44221 Squamous cell carcinoma of skin of unspecified ear and external auricular canal: Secondary | ICD-10-CM | POA: Diagnosis not present

## 2011-05-19 ENCOUNTER — Other Ambulatory Visit: Payer: Self-pay | Admitting: Pharmacist

## 2011-05-19 MED ORDER — WARFARIN SODIUM 5 MG PO TABS: ORAL_TABLET | ORAL | Status: DC

## 2011-05-24 DIAGNOSIS — L570 Actinic keratosis: Secondary | ICD-10-CM | POA: Diagnosis not present

## 2011-05-29 ENCOUNTER — Ambulatory Visit (INDEPENDENT_AMBULATORY_CARE_PROVIDER_SITE_OTHER): Payer: Medicare Other | Admitting: *Deleted

## 2011-05-29 DIAGNOSIS — Z7901 Long term (current) use of anticoagulants: Secondary | ICD-10-CM

## 2011-05-29 DIAGNOSIS — Z86718 Personal history of other venous thrombosis and embolism: Secondary | ICD-10-CM

## 2011-06-26 DIAGNOSIS — L821 Other seborrheic keratosis: Secondary | ICD-10-CM | POA: Diagnosis not present

## 2011-06-26 DIAGNOSIS — L570 Actinic keratosis: Secondary | ICD-10-CM | POA: Diagnosis not present

## 2011-06-26 DIAGNOSIS — Z85828 Personal history of other malignant neoplasm of skin: Secondary | ICD-10-CM | POA: Diagnosis not present

## 2011-07-11 ENCOUNTER — Ambulatory Visit (INDEPENDENT_AMBULATORY_CARE_PROVIDER_SITE_OTHER): Payer: Medicare Other | Admitting: Pharmacist

## 2011-07-11 DIAGNOSIS — Z86718 Personal history of other venous thrombosis and embolism: Secondary | ICD-10-CM | POA: Diagnosis not present

## 2011-07-11 DIAGNOSIS — Z7901 Long term (current) use of anticoagulants: Secondary | ICD-10-CM | POA: Diagnosis not present

## 2011-07-11 LAB — POCT INR: INR: 2.3

## 2011-08-23 ENCOUNTER — Ambulatory Visit (INDEPENDENT_AMBULATORY_CARE_PROVIDER_SITE_OTHER): Payer: Medicare Other | Admitting: *Deleted

## 2011-08-23 DIAGNOSIS — Z86718 Personal history of other venous thrombosis and embolism: Secondary | ICD-10-CM

## 2011-08-23 DIAGNOSIS — Z7901 Long term (current) use of anticoagulants: Secondary | ICD-10-CM

## 2011-08-23 LAB — POCT INR: INR: 2

## 2011-09-29 ENCOUNTER — Other Ambulatory Visit: Payer: Self-pay | Admitting: Dermatology

## 2011-09-29 DIAGNOSIS — D485 Neoplasm of uncertain behavior of skin: Secondary | ICD-10-CM | POA: Diagnosis not present

## 2011-09-29 DIAGNOSIS — C44611 Basal cell carcinoma of skin of unspecified upper limb, including shoulder: Secondary | ICD-10-CM | POA: Diagnosis not present

## 2011-09-29 DIAGNOSIS — L570 Actinic keratosis: Secondary | ICD-10-CM | POA: Diagnosis not present

## 2011-10-04 ENCOUNTER — Ambulatory Visit (INDEPENDENT_AMBULATORY_CARE_PROVIDER_SITE_OTHER): Payer: Medicare Other | Admitting: Pharmacist

## 2011-10-04 DIAGNOSIS — Z7901 Long term (current) use of anticoagulants: Secondary | ICD-10-CM | POA: Diagnosis not present

## 2011-10-04 DIAGNOSIS — Z86718 Personal history of other venous thrombosis and embolism: Secondary | ICD-10-CM | POA: Diagnosis not present

## 2011-10-04 LAB — POCT INR: INR: 2.3

## 2011-10-04 MED ORDER — WARFARIN SODIUM 5 MG PO TABS: ORAL_TABLET | ORAL | Status: DC

## 2011-10-06 DIAGNOSIS — H1130 Conjunctival hemorrhage, unspecified eye: Secondary | ICD-10-CM | POA: Diagnosis not present

## 2011-10-06 DIAGNOSIS — H0019 Chalazion unspecified eye, unspecified eyelid: Secondary | ICD-10-CM | POA: Diagnosis not present

## 2011-10-06 DIAGNOSIS — H01009 Unspecified blepharitis unspecified eye, unspecified eyelid: Secondary | ICD-10-CM | POA: Diagnosis not present

## 2011-10-18 ENCOUNTER — Encounter: Payer: Self-pay | Admitting: Pharmacist

## 2011-11-08 DIAGNOSIS — D485 Neoplasm of uncertain behavior of skin: Secondary | ICD-10-CM | POA: Diagnosis not present

## 2011-11-08 DIAGNOSIS — H43819 Vitreous degeneration, unspecified eye: Secondary | ICD-10-CM | POA: Diagnosis not present

## 2011-11-08 DIAGNOSIS — H52209 Unspecified astigmatism, unspecified eye: Secondary | ICD-10-CM | POA: Diagnosis not present

## 2011-11-08 DIAGNOSIS — H259 Unspecified age-related cataract: Secondary | ICD-10-CM | POA: Diagnosis not present

## 2011-11-15 ENCOUNTER — Ambulatory Visit (INDEPENDENT_AMBULATORY_CARE_PROVIDER_SITE_OTHER): Payer: Medicare Other | Admitting: *Deleted

## 2011-11-15 DIAGNOSIS — Z7901 Long term (current) use of anticoagulants: Secondary | ICD-10-CM

## 2011-11-15 DIAGNOSIS — Z86718 Personal history of other venous thrombosis and embolism: Secondary | ICD-10-CM

## 2011-11-15 LAB — POCT INR: INR: 2.5

## 2011-12-04 DIAGNOSIS — Z23 Encounter for immunization: Secondary | ICD-10-CM | POA: Diagnosis not present

## 2011-12-05 ENCOUNTER — Other Ambulatory Visit: Payer: Self-pay | Admitting: Ophthalmology

## 2011-12-05 DIAGNOSIS — D2310 Other benign neoplasm of skin of unspecified eyelid, including canthus: Secondary | ICD-10-CM | POA: Diagnosis not present

## 2011-12-05 DIAGNOSIS — L821 Other seborrheic keratosis: Secondary | ICD-10-CM | POA: Diagnosis not present

## 2011-12-05 DIAGNOSIS — D485 Neoplasm of uncertain behavior of skin: Secondary | ICD-10-CM | POA: Diagnosis not present

## 2011-12-13 ENCOUNTER — Other Ambulatory Visit: Payer: Self-pay

## 2011-12-13 DIAGNOSIS — E785 Hyperlipidemia, unspecified: Secondary | ICD-10-CM

## 2011-12-13 DIAGNOSIS — T887XXA Unspecified adverse effect of drug or medicament, initial encounter: Secondary | ICD-10-CM

## 2011-12-13 DIAGNOSIS — E039 Hypothyroidism, unspecified: Secondary | ICD-10-CM

## 2011-12-13 NOTE — Telephone Encounter (Signed)
Patient also had 20 pills left so I did not send the Rx for Armour just case we medication adjustments were needed. The patient was ok with that.

## 2011-12-13 NOTE — Telephone Encounter (Signed)
244.9 will cover fasting lipids

## 2011-12-13 NOTE — Telephone Encounter (Signed)
Call from patient requesting a refill on Armour. I advised per Dr.Hopper's note in December he is due for Fasting labs, He is willing to do the labs on Friday. I scheduled the apt and put the order in. Would you like anything else Hop or is this ok. The diagnosis 995.20 will not cover the Lipid.   Please advise       KP   Notes from 02/08/11 Please schedule fasting Labs after 10 weeks of dose change : BMET,Lipids, hepatic panel, TSH. PLEASE BRING THESE INSTRUCTIONS TO FOLLOW UP LAB APPOINTMENT.This will guarantee correct labs are drawn, eliminating need for repeat blood sampling ( needle sticks ! ). Diagnoses /Codes: 244.9, 995.20. [employer]

## 2011-12-14 ENCOUNTER — Telehealth: Payer: Self-pay

## 2011-12-14 NOTE — Telephone Encounter (Signed)
Pt called left message on triage line wanting to change lab appt from 12/15/11 to Monday. Plz change appt if possible and advise pt.      MW [phone]

## 2011-12-14 NOTE — Telephone Encounter (Signed)
Pt called and has been moved to Monday at 

## 2011-12-14 NOTE — Telephone Encounter (Signed)
PATIENT ON FOR 9AM 11.1.13

## 2011-12-15 ENCOUNTER — Other Ambulatory Visit: Payer: Medicare Other

## 2011-12-18 ENCOUNTER — Other Ambulatory Visit (INDEPENDENT_AMBULATORY_CARE_PROVIDER_SITE_OTHER): Payer: Medicare Other

## 2011-12-18 DIAGNOSIS — T887XXA Unspecified adverse effect of drug or medicament, initial encounter: Secondary | ICD-10-CM

## 2011-12-18 DIAGNOSIS — E039 Hypothyroidism, unspecified: Secondary | ICD-10-CM

## 2011-12-18 DIAGNOSIS — E785 Hyperlipidemia, unspecified: Secondary | ICD-10-CM | POA: Diagnosis not present

## 2011-12-18 LAB — LIPID PANEL
Cholesterol: 147 mg/dL (ref 0–200)
LDL Cholesterol: 102 mg/dL — ABNORMAL HIGH (ref 0–99)
Triglycerides: 51 mg/dL (ref 0.0–149.0)
VLDL: 10.2 mg/dL (ref 0.0–40.0)

## 2011-12-18 LAB — BASIC METABOLIC PANEL
BUN: 16 mg/dL (ref 6–23)
Chloride: 104 mEq/L (ref 96–112)
Creatinine, Ser: 1 mg/dL (ref 0.4–1.5)
GFR: 76.56 mL/min (ref >60.00)
Potassium: 4.5 mEq/L (ref 3.5–5.1)

## 2011-12-18 LAB — HEPATIC FUNCTION PANEL
ALT: 22 U/L (ref 0–53)
AST: 24 U/L (ref 0–37)
Bilirubin, Direct: 0.1 mg/dL (ref 0.0–0.3)
Total Bilirubin: 0.6 mg/dL (ref 0.3–1.2)
Total Protein: 6.9 g/dL (ref 6.0–8.3)

## 2011-12-19 ENCOUNTER — Telehealth: Payer: Self-pay | Admitting: Internal Medicine

## 2011-12-19 NOTE — Telephone Encounter (Signed)
Message copied by Marshell Garfinkel on Tue Dec 19, 2011 11:14 AM ------      Message from: Pecola Lawless      Created: Tue Dec 19, 2011  9:05 AM       Please see me before refilling thyroid  Meds; dose may need to be adjusted

## 2011-12-19 NOTE — Telephone Encounter (Signed)
Lmovm for pt to call office. °

## 2011-12-20 ENCOUNTER — Telehealth: Payer: Self-pay

## 2011-12-20 NOTE — Telephone Encounter (Signed)
called [facility] back -- [facility] scheduled appt for Friday 11.8.13 at 10:30am--as per dr.hopps request below

## 2011-12-20 NOTE — Telephone Encounter (Signed)
error 

## 2011-12-22 ENCOUNTER — Ambulatory Visit (INDEPENDENT_AMBULATORY_CARE_PROVIDER_SITE_OTHER): Payer: Medicare Other | Admitting: Internal Medicine

## 2011-12-22 ENCOUNTER — Encounter: Payer: Self-pay | Admitting: Internal Medicine

## 2011-12-22 VITALS — BP 120/66 | HR 77 | Ht 76.0 in | Wt 207.8 lb

## 2011-12-22 DIAGNOSIS — E039 Hypothyroidism, unspecified: Secondary | ICD-10-CM

## 2011-12-22 DIAGNOSIS — M949 Disorder of cartilage, unspecified: Secondary | ICD-10-CM | POA: Diagnosis not present

## 2011-12-22 DIAGNOSIS — M899 Disorder of bone, unspecified: Secondary | ICD-10-CM | POA: Diagnosis not present

## 2011-12-22 DIAGNOSIS — T887XXA Unspecified adverse effect of drug or medicament, initial encounter: Secondary | ICD-10-CM

## 2011-12-22 DIAGNOSIS — Z862 Personal history of diseases of the blood and blood-forming organs and certain disorders involving the immune mechanism: Secondary | ICD-10-CM | POA: Diagnosis not present

## 2011-12-22 DIAGNOSIS — Z8639 Personal history of other endocrine, nutritional and metabolic disease: Secondary | ICD-10-CM

## 2011-12-22 DIAGNOSIS — M8580 Other specified disorders of bone density and structure, unspecified site: Secondary | ICD-10-CM

## 2011-12-22 MED ORDER — THYROID 120 MG PO TABS: ORAL_TABLET | ORAL | Status: DC

## 2011-12-22 NOTE — Addendum Note (Signed)
Addended by: Maurice Small on: 12/22/2011 09:39 AM   Modules accepted: Orders

## 2011-12-22 NOTE — Patient Instructions (Addendum)
Please sign a release of records to [facility]  for records related to 1971 thyroid surgery and pathology reports of the thyroid tissue. Review and correct the record as indicated. Please share record with all medical staff seen.   If you activate My Chart; the results can be released to you as soon as they populate from the lab. If you choose not to use this program; the labs have to be reviewed, copied & mailed   causing a delay in getting the results to you.

## 2011-12-22 NOTE — Progress Notes (Signed)
Subjective:    Patient ID: Clarence Hopkins, male    DOB: 10/07/1930, 78 y.o.   MRN: [id number]  HPI He returns for followup of lab results. TSH reveals a value of 0.14 indicating significant suppression. The exact details surrounding his thyroid surgery 1971 are indefinite period a "growth" was removed from one lobe and suppressive therapy was recommended apparently to prevent recurrence. There is no information about cytology.  He has not had a bone density study to date.   Review of Systems There is a strong family history of breast cancer in his family; specifically his mother, sister, and maternal grandmother had breast cancer. He has had prostatic surveillance of the urologist; but he was told to share but ongoing surveillance is not necessary.  He takes tamsulosin on average 3 days a week based on urine flow. He has minimized the use of this agent because of early cataracts. His ophthalmologist has recommended that he discontinue it altogether     Objective:   Physical Exam Gen.:  well-nourished in appearance. Alert, appropriate and cooperative throughout exam.Appears younger than stated age  Head: Normocephalic without obvious abnormalities Eyes: No corneal or conjunctival inflammation noted.  Extraocular motion intact. Vision grossly normal. Nose: External nasal exam reveals no deformity or inflammation. Nasal mucosa are pink and moist. No lesions or exudates noted.  Mouth: Oral mucosa and oropharynx reveal no lesions or exudates. Teeth in good repair. Some dislocation of the left temporal mandibular joint with mouth opening . Neck: No deformities, masses, or tenderness noted. Range of motion decreased. Thyroid : Absent left thyroid lobe; right lobe small without nodularity. Lungs: Normal respiratory effort; chest expands symmetrically. Lungs are clear to auscultation without rales, wheezes, or increased work of breathing. Heart: Normal rate and rhythm. Normal S1 and S2. No gallop,  or  rub. Loud click at apex without mitral regurgitation murmur. Abdomen: Bowel sounds normal; abdomen soft and nontender. No masses, organomegaly or hernias noted. Genitalia: Dr Laverle Patter                                                        Musculoskeletal/extremities: There is some asymmetry of the posterior thoracic musculature suggesting occult scoliosis.  No clubbing, cyanosis, edema, or deformity noted. Tone & strength  normal.Joints normal. Nail health  Good.Crepitus of knees Vascular: Carotid, radial artery, dorsalis pedis and  posterior tibial pulses are full and equal. No bruits present. Neurologic: Alert and oriented x3.       Skin: Intact without suspicious lesions or rashes. Lymph: No cervical, axillary lymphadenopathy present. Psych: Mood and affect are normal. Normally interactive                                                                                         Assessment & Plan:  #1 suppressive thyroid therapy for presumed abnormal thyroid nodule which was resected 1971. Exact details will be stopped from [facility] medical records. Thyroid ultrasound will be performed  #2 increased risk of  osteopenia/osteoporosis with the suppressive thyroid therapy. Bone mineral density will be ordered  #3 strong family history of breast cancer; he has been told by the urologist he needs no further digital rectal exam or PSA monitor.

## 2011-12-27 ENCOUNTER — Ambulatory Visit (INDEPENDENT_AMBULATORY_CARE_PROVIDER_SITE_OTHER): Payer: Medicare Other | Admitting: General Practice

## 2011-12-27 DIAGNOSIS — Z7901 Long term (current) use of anticoagulants: Secondary | ICD-10-CM

## 2011-12-27 DIAGNOSIS — Z86718 Personal history of other venous thrombosis and embolism: Secondary | ICD-10-CM

## 2011-12-27 LAB — POCT INR: INR: 2.2

## 2011-12-29 ENCOUNTER — Ambulatory Visit
Admission: RE | Admit: 2011-12-29 | Discharge: 2011-12-29 | Disposition: A | Discharge status: Home / Self Care | Payer: Medicare Other | Source: Ambulatory Visit | Attending: Internal Medicine | Admitting: Internal Medicine

## 2011-12-29 ENCOUNTER — Encounter: Payer: Self-pay | Admitting: *Deleted

## 2011-12-29 DIAGNOSIS — E039 Hypothyroidism, unspecified: Secondary | ICD-10-CM

## 2011-12-29 DIAGNOSIS — Z8639 Personal history of other endocrine, nutritional and metabolic disease: Secondary | ICD-10-CM

## 2011-12-29 DIAGNOSIS — R599 Enlarged lymph nodes, unspecified: Secondary | ICD-10-CM | POA: Diagnosis not present

## 2012-01-03 ENCOUNTER — Telehealth: Payer: Self-pay

## 2012-01-03 NOTE — Telephone Encounter (Signed)
Pt LMOVM triage line stating received letter yesterday and wanted to discuss info. Included in letter ( I assume its lab results pt didin't say). Plz advise pt ZO:[phone] MW

## 2012-01-03 NOTE — Telephone Encounter (Signed)
Spoke with patient, discussed U/S of head and neck. Patient verbalized understanding of report. Patient schedule to have labs and then a follow-up with Dr.Hopper

## 2012-01-09 DIAGNOSIS — L819 Disorder of pigmentation, unspecified: Secondary | ICD-10-CM | POA: Diagnosis not present

## 2012-01-09 DIAGNOSIS — D239 Other benign neoplasm of skin, unspecified: Secondary | ICD-10-CM | POA: Diagnosis not present

## 2012-01-09 DIAGNOSIS — L820 Inflamed seborrheic keratosis: Secondary | ICD-10-CM | POA: Diagnosis not present

## 2012-01-09 DIAGNOSIS — L821 Other seborrheic keratosis: Secondary | ICD-10-CM | POA: Diagnosis not present

## 2012-01-09 DIAGNOSIS — L723 Sebaceous cyst: Secondary | ICD-10-CM | POA: Diagnosis not present

## 2012-01-09 DIAGNOSIS — D1801 Hemangioma of skin and subcutaneous tissue: Secondary | ICD-10-CM | POA: Diagnosis not present

## 2012-01-09 DIAGNOSIS — L570 Actinic keratosis: Secondary | ICD-10-CM | POA: Diagnosis not present

## 2012-01-10 ENCOUNTER — Other Ambulatory Visit (INDEPENDENT_AMBULATORY_CARE_PROVIDER_SITE_OTHER): Payer: Medicare Other

## 2012-01-10 DIAGNOSIS — R591 Generalized enlarged lymph nodes: Secondary | ICD-10-CM

## 2012-01-10 DIAGNOSIS — R599 Enlarged lymph nodes, unspecified: Secondary | ICD-10-CM

## 2012-01-10 LAB — CBC WITH DIFFERENTIAL/PLATELET
Basophils Absolute: 0 10*3/uL (ref 0.0–0.1)
Eosinophils Absolute: 0.3 10*3/uL (ref 0.0–0.7)
Hemoglobin: 14.1 g/dL (ref 13.0–17.0)
Lymphocytes Relative: 22.7 % (ref 12.0–46.0)
Monocytes Relative: 7.6 % (ref 3.0–12.0)
Neutro Abs: 3.9 10*3/uL (ref 1.4–7.7)
Neutrophils Relative %: 64.8 % (ref 43.0–77.0)
RDW: 13.8 % (ref 11.5–14.6)

## 2012-01-17 ENCOUNTER — Ambulatory Visit (INDEPENDENT_AMBULATORY_CARE_PROVIDER_SITE_OTHER): Payer: Medicare Other | Admitting: Internal Medicine

## 2012-01-17 ENCOUNTER — Encounter: Payer: Self-pay | Admitting: Internal Medicine

## 2012-01-17 VITALS — BP 126/88 | HR 75 | Wt 203.5 lb

## 2012-01-17 DIAGNOSIS — E039 Hypothyroidism, unspecified: Secondary | ICD-10-CM | POA: Diagnosis not present

## 2012-01-17 DIAGNOSIS — C439 Malignant melanoma of skin, unspecified: Secondary | ICD-10-CM | POA: Diagnosis not present

## 2012-01-17 NOTE — Assessment & Plan Note (Signed)
Current ultrasound reveals no thyroid nodules. Questionable small "atypical" left cervical lymph nodes. No significant lymphadenopathy noted on exam.  Options and risks will be discussed. He defers CT scan @ present  If bone density reveals osteopenia; less suppressive thyroid supplementation will be pursued.

## 2012-01-17 NOTE — Patient Instructions (Addendum)
Review and correct the record as indicated. Please share record with all medical staff seen.  

## 2012-01-17 NOTE — Assessment & Plan Note (Signed)
Dr. Yetta Barre sees him every 90 days as monitor

## 2012-01-17 NOTE — Progress Notes (Signed)
  Subjective:    Patient ID: Clarence Hopkins, male    DOB: [birth year], 78 y.o.   MRN: [id number]  HPI The thyroid ultrasound revealed no nodules. The smaller left lobe suggests that he has had a partial left thyroidectomy. Unfortunately old records are not available from [facility]. He has been on suppressive thyroid supplementation since the surgery. A bone density study is planned to rule out osteopenia.  An incidental finding were "atypical lymph nodes" measuring 9 mm with solid/cystic changes & another lymph node of 10 mm in the left neck.  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 He is followed by Dr. Yetta Barre, Dermatologist; he was seen last week.   Review of Systems He denies  symptoms of fever, chills, sweats, or unexplained weight loss. CBC and differential 01/10/12 was completely normal.     Objective:   Physical Exam  He appears healthy and well-nourished in no distress  There is no scleral icterus or jaundice present  I cannot appreciate any lymphadenopathy about the neck, axilla, or inguinal areas  He has no organomegaly or masses on abdominal exam        Assessment & Plan:

## 2012-01-24 ENCOUNTER — Ambulatory Visit (INDEPENDENT_AMBULATORY_CARE_PROVIDER_SITE_OTHER)
Admission: RE | Admit: 2012-01-24 | Discharge: 2012-01-24 | Disposition: A | Discharge status: Home / Self Care | Payer: Medicare Other | Source: Ambulatory Visit

## 2012-01-24 DIAGNOSIS — M949 Disorder of cartilage, unspecified: Secondary | ICD-10-CM | POA: Diagnosis not present

## 2012-01-24 DIAGNOSIS — T887XXA Unspecified adverse effect of drug or medicament, initial encounter: Secondary | ICD-10-CM

## 2012-01-24 DIAGNOSIS — M899 Disorder of bone, unspecified: Secondary | ICD-10-CM | POA: Diagnosis not present

## 2012-01-24 DIAGNOSIS — M8580 Other specified disorders of bone density and structure, unspecified site: Secondary | ICD-10-CM

## 2012-01-24 DIAGNOSIS — E039 Hypothyroidism, unspecified: Secondary | ICD-10-CM | POA: Diagnosis not present

## 2012-02-05 ENCOUNTER — Ambulatory Visit (INDEPENDENT_AMBULATORY_CARE_PROVIDER_SITE_OTHER): Payer: Medicare Other | Admitting: General Practice

## 2012-02-05 DIAGNOSIS — Z7901 Long term (current) use of anticoagulants: Secondary | ICD-10-CM

## 2012-02-05 DIAGNOSIS — Z86718 Personal history of other venous thrombosis and embolism: Secondary | ICD-10-CM

## 2012-02-05 LAB — POCT INR: INR: 2.5

## 2012-02-29 ENCOUNTER — Telehealth: Payer: Self-pay | Admitting: Internal Medicine

## 2012-02-29 NOTE — Telephone Encounter (Signed)
OK # 500

## 2012-02-29 NOTE — Telephone Encounter (Signed)
Hopp please advise if ok to give # 500 of thyroid med, last TSH 0.14 on 12/18/11-see comments on labs from 12/18/11   Hopp please print BMD results for I am unable to, I have tried several times and it will not print off. I will mail to patient. I am unsure as to why patient did not received it

## 2012-02-29 NOTE — Telephone Encounter (Signed)
Patient states he needs rx for armour 120mg  #500 mailed to his home address. He states he takes this to the pharmacy himself and pays out of pocket. He also states he never received the results from his bone density scan.

## 2012-03-01 MED ORDER — THYROID 120 MG PO TABS: ORAL_TABLET | ORAL | Status: DC

## 2012-03-01 NOTE — Telephone Encounter (Signed)
Spoke with patient, patient aware of BMD results:  Findings : lowest T score - 1.0 @ femoral neck Diagnosis: MINIMAL Osteopenia Recommended lifestyle interventions to prevent bone thinning include calcium 600 mg daily & vitamin D3 supplementation to keep vit D level @ least 40. The usual vitamin D3 dose is 1000 IU daily; but individual dose is determined by annual vitamin D level monitor. Also weight bearing exercise such as walking 30-45 minutes 3-4 X per week is recommended. Hopp   RX for thyroid med to be mailed

## 2012-03-06 ENCOUNTER — Other Ambulatory Visit: Payer: Self-pay | Admitting: Critical Care Medicine

## 2012-03-08 ENCOUNTER — Other Ambulatory Visit: Payer: Self-pay | Admitting: Cardiology

## 2012-03-08 NOTE — Telephone Encounter (Signed)
Coumadin is being managed by the coumadin clinic.  Pt was last seen by Dr. Delford Field in 2011.    Called, spoke with Shiny at [facility].  She will contact [facility] for refill.

## 2012-03-12 DIAGNOSIS — N401 Enlarged prostate with lower urinary tract symptoms: Secondary | ICD-10-CM | POA: Diagnosis not present

## 2012-03-12 DIAGNOSIS — N138 Other obstructive and reflux uropathy: Secondary | ICD-10-CM | POA: Diagnosis not present

## 2012-03-22 ENCOUNTER — Ambulatory Visit (INDEPENDENT_AMBULATORY_CARE_PROVIDER_SITE_OTHER): Payer: Medicare Other | Admitting: General Practice

## 2012-03-22 DIAGNOSIS — Z7901 Long term (current) use of anticoagulants: Secondary | ICD-10-CM | POA: Diagnosis not present

## 2012-03-22 DIAGNOSIS — Z86718 Personal history of other venous thrombosis and embolism: Secondary | ICD-10-CM | POA: Diagnosis not present

## 2012-03-22 LAB — POCT INR: INR: 2.7

## 2012-04-01 ENCOUNTER — Encounter: Payer: Self-pay | Admitting: *Deleted

## 2012-04-15 ENCOUNTER — Other Ambulatory Visit: Payer: Self-pay | Admitting: Dermatology

## 2012-04-15 DIAGNOSIS — Z85820 Personal history of malignant melanoma of skin: Secondary | ICD-10-CM | POA: Diagnosis not present

## 2012-04-15 DIAGNOSIS — C4360 Malignant melanoma of unspecified upper limb, including shoulder: Secondary | ICD-10-CM | POA: Diagnosis not present

## 2012-04-15 DIAGNOSIS — Z85828 Personal history of other malignant neoplasm of skin: Secondary | ICD-10-CM | POA: Diagnosis not present

## 2012-04-15 DIAGNOSIS — L821 Other seborrheic keratosis: Secondary | ICD-10-CM | POA: Diagnosis not present

## 2012-04-15 DIAGNOSIS — D1801 Hemangioma of skin and subcutaneous tissue: Secondary | ICD-10-CM | POA: Diagnosis not present

## 2012-04-15 DIAGNOSIS — L570 Actinic keratosis: Secondary | ICD-10-CM | POA: Diagnosis not present

## 2012-04-15 DIAGNOSIS — D239 Other benign neoplasm of skin, unspecified: Secondary | ICD-10-CM | POA: Diagnosis not present

## 2012-04-15 DIAGNOSIS — D485 Neoplasm of uncertain behavior of skin: Secondary | ICD-10-CM | POA: Diagnosis not present

## 2012-04-15 DIAGNOSIS — L819 Disorder of pigmentation, unspecified: Secondary | ICD-10-CM | POA: Diagnosis not present

## 2012-04-17 ENCOUNTER — Encounter: Payer: Self-pay | Admitting: Internal Medicine

## 2012-04-24 ENCOUNTER — Other Ambulatory Visit: Payer: Self-pay | Admitting: Dermatology

## 2012-04-24 DIAGNOSIS — C4360 Malignant melanoma of unspecified upper limb, including shoulder: Secondary | ICD-10-CM | POA: Diagnosis not present

## 2012-05-01 ENCOUNTER — Ambulatory Visit (INDEPENDENT_AMBULATORY_CARE_PROVIDER_SITE_OTHER): Payer: Medicare Other | Admitting: General Practice

## 2012-05-01 DIAGNOSIS — Z86718 Personal history of other venous thrombosis and embolism: Secondary | ICD-10-CM

## 2012-05-01 DIAGNOSIS — Z7901 Long term (current) use of anticoagulants: Secondary | ICD-10-CM

## 2012-05-01 LAB — POCT INR: INR: 2

## 2012-05-27 DIAGNOSIS — M19049 Primary osteoarthritis, unspecified hand: Secondary | ICD-10-CM | POA: Diagnosis not present

## 2012-06-12 ENCOUNTER — Other Ambulatory Visit: Payer: Self-pay | Admitting: General Practice

## 2012-06-12 ENCOUNTER — Ambulatory Visit (INDEPENDENT_AMBULATORY_CARE_PROVIDER_SITE_OTHER): Payer: Medicare Other | Admitting: General Practice

## 2012-06-12 DIAGNOSIS — Z86718 Personal history of other venous thrombosis and embolism: Secondary | ICD-10-CM | POA: Diagnosis not present

## 2012-06-12 DIAGNOSIS — Z7901 Long term (current) use of anticoagulants: Secondary | ICD-10-CM | POA: Diagnosis not present

## 2012-06-12 LAB — POCT INR: INR: 2.1

## 2012-06-12 MED ORDER — WARFARIN SODIUM 5 MG PO TABS: 5.0000 mg | ORAL_TABLET | Freq: Every day | ORAL | Status: DC

## 2012-07-23 DIAGNOSIS — L821 Other seborrheic keratosis: Secondary | ICD-10-CM | POA: Diagnosis not present

## 2012-07-23 DIAGNOSIS — Z85820 Personal history of malignant melanoma of skin: Secondary | ICD-10-CM | POA: Diagnosis not present

## 2012-07-23 DIAGNOSIS — C44711 Basal cell carcinoma of skin of unspecified lower limb, including hip: Secondary | ICD-10-CM | POA: Diagnosis not present

## 2012-07-23 DIAGNOSIS — Z85828 Personal history of other malignant neoplasm of skin: Secondary | ICD-10-CM | POA: Diagnosis not present

## 2012-07-23 DIAGNOSIS — L570 Actinic keratosis: Secondary | ICD-10-CM | POA: Diagnosis not present

## 2012-07-23 DIAGNOSIS — D1801 Hemangioma of skin and subcutaneous tissue: Secondary | ICD-10-CM | POA: Diagnosis not present

## 2012-07-23 DIAGNOSIS — L819 Disorder of pigmentation, unspecified: Secondary | ICD-10-CM | POA: Diagnosis not present

## 2012-07-23 DIAGNOSIS — D239 Other benign neoplasm of skin, unspecified: Secondary | ICD-10-CM | POA: Diagnosis not present

## 2012-07-24 ENCOUNTER — Ambulatory Visit (INDEPENDENT_AMBULATORY_CARE_PROVIDER_SITE_OTHER): Payer: Medicare Other | Admitting: Family Medicine

## 2012-07-24 DIAGNOSIS — Z86718 Personal history of other venous thrombosis and embolism: Secondary | ICD-10-CM

## 2012-07-24 DIAGNOSIS — Z7901 Long term (current) use of anticoagulants: Secondary | ICD-10-CM

## 2012-07-24 LAB — POCT INR: INR: 3.2

## 2012-08-23 ENCOUNTER — Ambulatory Visit (INDEPENDENT_AMBULATORY_CARE_PROVIDER_SITE_OTHER): Payer: Medicare Other | Admitting: General Practice

## 2012-08-23 DIAGNOSIS — Z7901 Long term (current) use of anticoagulants: Secondary | ICD-10-CM | POA: Diagnosis not present

## 2012-08-23 DIAGNOSIS — Z86718 Personal history of other venous thrombosis and embolism: Secondary | ICD-10-CM | POA: Diagnosis not present

## 2012-08-23 LAB — POCT INR: INR: 3

## 2012-09-11 DIAGNOSIS — M19049 Primary osteoarthritis, unspecified hand: Secondary | ICD-10-CM | POA: Diagnosis not present

## 2012-09-18 ENCOUNTER — Ambulatory Visit (INDEPENDENT_AMBULATORY_CARE_PROVIDER_SITE_OTHER): Payer: Medicare Other | Admitting: General Practice

## 2012-09-18 DIAGNOSIS — Z7901 Long term (current) use of anticoagulants: Secondary | ICD-10-CM

## 2012-09-18 DIAGNOSIS — Z86718 Personal history of other venous thrombosis and embolism: Secondary | ICD-10-CM

## 2012-09-18 LAB — POCT INR: INR: 2.5

## 2012-09-27 DIAGNOSIS — M19049 Primary osteoarthritis, unspecified hand: Secondary | ICD-10-CM | POA: Diagnosis not present

## 2012-10-28 ENCOUNTER — Other Ambulatory Visit: Payer: Self-pay | Admitting: Dermatology

## 2012-10-28 DIAGNOSIS — L821 Other seborrheic keratosis: Secondary | ICD-10-CM | POA: Diagnosis not present

## 2012-10-28 DIAGNOSIS — L570 Actinic keratosis: Secondary | ICD-10-CM | POA: Diagnosis not present

## 2012-10-28 DIAGNOSIS — Z85828 Personal history of other malignant neoplasm of skin: Secondary | ICD-10-CM | POA: Diagnosis not present

## 2012-10-28 DIAGNOSIS — D1801 Hemangioma of skin and subcutaneous tissue: Secondary | ICD-10-CM | POA: Diagnosis not present

## 2012-10-28 DIAGNOSIS — L819 Disorder of pigmentation, unspecified: Secondary | ICD-10-CM | POA: Diagnosis not present

## 2012-10-28 DIAGNOSIS — D485 Neoplasm of uncertain behavior of skin: Secondary | ICD-10-CM | POA: Diagnosis not present

## 2012-10-28 DIAGNOSIS — C44711 Basal cell carcinoma of skin of unspecified lower limb, including hip: Secondary | ICD-10-CM | POA: Diagnosis not present

## 2012-10-30 ENCOUNTER — Ambulatory Visit (INDEPENDENT_AMBULATORY_CARE_PROVIDER_SITE_OTHER): Payer: Medicare Other | Admitting: General Practice

## 2012-10-30 ENCOUNTER — Encounter: Payer: Self-pay | Admitting: Internal Medicine

## 2012-10-30 DIAGNOSIS — Z86718 Personal history of other venous thrombosis and embolism: Secondary | ICD-10-CM

## 2012-10-30 DIAGNOSIS — Z7901 Long term (current) use of anticoagulants: Secondary | ICD-10-CM | POA: Diagnosis not present

## 2012-10-30 DIAGNOSIS — Z85828 Personal history of other malignant neoplasm of skin: Secondary | ICD-10-CM | POA: Insufficient documentation

## 2012-10-30 LAB — POCT INR: INR: 2.2

## 2012-12-04 DIAGNOSIS — Z23 Encounter for immunization: Secondary | ICD-10-CM | POA: Diagnosis not present

## 2012-12-09 DIAGNOSIS — M19049 Primary osteoarthritis, unspecified hand: Secondary | ICD-10-CM | POA: Diagnosis not present

## 2012-12-11 ENCOUNTER — Ambulatory Visit (INDEPENDENT_AMBULATORY_CARE_PROVIDER_SITE_OTHER): Payer: Medicare Other | Admitting: General Practice

## 2012-12-11 DIAGNOSIS — Z7901 Long term (current) use of anticoagulants: Secondary | ICD-10-CM | POA: Diagnosis not present

## 2012-12-11 DIAGNOSIS — Z5181 Encounter for therapeutic drug level monitoring: Secondary | ICD-10-CM | POA: Diagnosis not present

## 2012-12-11 DIAGNOSIS — Z86718 Personal history of other venous thrombosis and embolism: Secondary | ICD-10-CM | POA: Diagnosis not present

## 2012-12-20 ENCOUNTER — Other Ambulatory Visit: Payer: Self-pay | Admitting: General Practice

## 2012-12-20 ENCOUNTER — Other Ambulatory Visit: Payer: Self-pay | Admitting: Internal Medicine

## 2012-12-20 MED ORDER — WARFARIN SODIUM 5 MG PO TABS: ORAL_TABLET | ORAL | Status: DC

## 2013-01-22 ENCOUNTER — Ambulatory Visit (INDEPENDENT_AMBULATORY_CARE_PROVIDER_SITE_OTHER): Payer: Medicare Other | Admitting: General Practice

## 2013-01-22 DIAGNOSIS — Z7901 Long term (current) use of anticoagulants: Secondary | ICD-10-CM | POA: Diagnosis not present

## 2013-01-22 DIAGNOSIS — Z86718 Personal history of other venous thrombosis and embolism: Secondary | ICD-10-CM | POA: Diagnosis not present

## 2013-01-22 NOTE — Progress Notes (Signed)
Pre-visit discussion using our clinic review tool. No additional management support is needed unless otherwise documented below in the visit note.  

## 2013-01-30 ENCOUNTER — Telehealth: Payer: Self-pay | Admitting: *Deleted

## 2013-01-30 NOTE — Telephone Encounter (Signed)
Pt requesting #90 supply for coumadin 5mg  .  Last refilled -12/20/12  #40 / 3 Rf  Please advise. DJR

## 2013-01-30 NOTE — Telephone Encounter (Signed)
OK # 90 

## 2013-01-31 ENCOUNTER — Other Ambulatory Visit: Payer: Self-pay | Admitting: *Deleted

## 2013-01-31 MED ORDER — WARFARIN SODIUM 5 MG PO TABS: ORAL_TABLET | ORAL | Status: DC

## 2013-01-31 NOTE — Telephone Encounter (Signed)
Warfarin prescription e-scribed to pharmacy. JG//CMA

## 2013-03-05 ENCOUNTER — Ambulatory Visit (INDEPENDENT_AMBULATORY_CARE_PROVIDER_SITE_OTHER): Payer: Medicare Other | Admitting: General Practice

## 2013-03-05 DIAGNOSIS — Z86718 Personal history of other venous thrombosis and embolism: Secondary | ICD-10-CM

## 2013-03-05 DIAGNOSIS — Z7901 Long term (current) use of anticoagulants: Secondary | ICD-10-CM | POA: Diagnosis not present

## 2013-03-05 LAB — POCT INR: INR: 1.8

## 2013-03-05 NOTE — Progress Notes (Signed)
Pre-visit discussion using our clinic review tool. No additional management support is needed unless otherwise documented below in the visit note.  

## 2013-03-06 ENCOUNTER — Other Ambulatory Visit: Payer: Self-pay | Admitting: Dermatology

## 2013-03-06 DIAGNOSIS — Z85828 Personal history of other malignant neoplasm of skin: Secondary | ICD-10-CM | POA: Diagnosis not present

## 2013-03-06 DIAGNOSIS — C4441 Basal cell carcinoma of skin of scalp and neck: Secondary | ICD-10-CM | POA: Diagnosis not present

## 2013-03-06 DIAGNOSIS — Z85820 Personal history of malignant melanoma of skin: Secondary | ICD-10-CM | POA: Diagnosis not present

## 2013-03-06 DIAGNOSIS — L819 Disorder of pigmentation, unspecified: Secondary | ICD-10-CM | POA: Diagnosis not present

## 2013-03-06 DIAGNOSIS — D485 Neoplasm of uncertain behavior of skin: Secondary | ICD-10-CM | POA: Diagnosis not present

## 2013-03-06 DIAGNOSIS — L820 Inflamed seborrheic keratosis: Secondary | ICD-10-CM | POA: Diagnosis not present

## 2013-03-06 DIAGNOSIS — L821 Other seborrheic keratosis: Secondary | ICD-10-CM | POA: Diagnosis not present

## 2013-03-06 DIAGNOSIS — D1801 Hemangioma of skin and subcutaneous tissue: Secondary | ICD-10-CM | POA: Diagnosis not present

## 2013-03-31 ENCOUNTER — Ambulatory Visit (INDEPENDENT_AMBULATORY_CARE_PROVIDER_SITE_OTHER): Payer: Medicare Other

## 2013-03-31 ENCOUNTER — Encounter: Payer: Self-pay | Admitting: Critical Care Medicine

## 2013-03-31 ENCOUNTER — Ambulatory Visit (INDEPENDENT_AMBULATORY_CARE_PROVIDER_SITE_OTHER): Payer: Medicare Other | Admitting: Critical Care Medicine

## 2013-03-31 VITALS — BP 128/84 | HR 71 | Temp 97.8°F | Ht 76.0 in | Wt 210.8 lb

## 2013-03-31 DIAGNOSIS — D6859 Other primary thrombophilia: Secondary | ICD-10-CM | POA: Diagnosis not present

## 2013-03-31 DIAGNOSIS — N400 Enlarged prostate without lower urinary tract symptoms: Secondary | ICD-10-CM | POA: Diagnosis not present

## 2013-03-31 DIAGNOSIS — E039 Hypothyroidism, unspecified: Secondary | ICD-10-CM

## 2013-03-31 DIAGNOSIS — R892 Abnormal level of other drugs, medicaments and biological substances in specimens from other organs, systems and tissues: Secondary | ICD-10-CM | POA: Diagnosis not present

## 2013-03-31 DIAGNOSIS — C439 Malignant melanoma of skin, unspecified: Secondary | ICD-10-CM

## 2013-03-31 DIAGNOSIS — Z86718 Personal history of other venous thrombosis and embolism: Secondary | ICD-10-CM

## 2013-03-31 LAB — CBC WITH DIFFERENTIAL/PLATELET
Basophils Absolute: 0 10*3/uL (ref 0.0–0.1)
Basophils Relative: 0.3 % (ref 0.0–3.0)
Eosinophils Absolute: 0.2 10*3/uL (ref 0.0–0.7)
Eosinophils Relative: 3.6 % (ref 0.0–5.0)
HEMATOCRIT: 44.4 % (ref 39.0–52.0)
Hemoglobin: 14.2 g/dL (ref 13.0–17.0)
LYMPHS ABS: 1.5 10*3/uL (ref 0.7–4.0)
Lymphocytes Relative: 26.2 % (ref 12.0–46.0)
MCHC: 32 g/dL (ref 30.0–36.0)
MCV: 86.8 fl (ref 78.0–100.0)
MONO ABS: 0.5 10*3/uL (ref 0.1–1.0)
Monocytes Relative: 8.7 % (ref 3.0–12.0)
Neutro Abs: 3.5 10*3/uL (ref 1.4–7.7)
Neutrophils Relative %: 61.2 % (ref 43.0–77.0)
PLATELETS: 246 10*3/uL (ref 150.0–400.0)
RBC: 5.12 Mil/uL (ref 4.22–5.81)
RDW: 13.5 % (ref 11.5–14.6)
WBC: 5.7 10*3/uL (ref 4.5–10.5)

## 2013-03-31 LAB — LIPID PANEL
CHOLESTEROL: 177 mg/dL (ref 0–200)
HDL: 36.5 mg/dL — ABNORMAL LOW (ref >39.00)
Total CHOL/HDL Ratio: 5
Triglycerides: 202 mg/dL — ABNORMAL HIGH (ref 0.0–149.0)
VLDL: 40.4 mg/dL — AB (ref 0.0–40.0)

## 2013-03-31 LAB — COMPREHENSIVE METABOLIC PANEL
ALBUMIN: 4.1 g/dL (ref 3.5–5.2)
ALK PHOS: 84 U/L (ref 39–117)
ALT: 22 U/L (ref 0–53)
AST: 25 U/L (ref 0–37)
BUN: 19 mg/dL (ref 6–23)
CO2: 29 meq/L (ref 19–32)
Calcium: 9.1 mg/dL (ref 8.4–10.5)
Chloride: 104 mEq/L (ref 96–112)
Creatinine, Ser: 1.1 mg/dL (ref 0.4–1.5)
GFR: 69.09 mL/min (ref >60.00)
Glucose, Bld: 94 mg/dL (ref 70–99)
POTASSIUM: 4.4 meq/L (ref 3.5–5.1)
SODIUM: 142 meq/L (ref 135–145)
TOTAL PROTEIN: 7.3 g/dL (ref 6.0–8.3)
Total Bilirubin: 0.8 mg/dL (ref 0.3–1.2)

## 2013-03-31 LAB — PROTIME-INR
INR: 2.9 ratio — ABNORMAL HIGH (ref 0.8–1.0)
PROTHROMBIN TIME: 29.9 s — AB (ref 10.2–12.4)

## 2013-03-31 LAB — TSH: TSH: 0.69 u[IU]/mL (ref 0.35–5.50)

## 2013-03-31 LAB — PSA: PSA: 4.32 ng/mL — ABNORMAL HIGH (ref 0.10–4.00)

## 2013-03-31 NOTE — Assessment & Plan Note (Signed)
Hx of unprovoked PE/DVT with prot C deficiency On coumadin for life Plan Cont coumadin clinic 5mg /d  Recheck pt/inr chk cbc and cmet

## 2013-03-31 NOTE — Assessment & Plan Note (Signed)
No new issues Cont tamsulosin daily

## 2013-03-31 NOTE — Assessment & Plan Note (Signed)
R antecubital melanoma 2014 New lesion anterior chest, asked pt to see Derm re this issue

## 2013-03-31 NOTE — Assessment & Plan Note (Signed)
Hx of basal cell Ca of LE New lesion on anterior sternum, looks inflammatory Suggested to pt to see Derm MD Dr Ronnald Ramp

## 2013-03-31 NOTE — Patient Instructions (Signed)
Labs today No change in medications We will try to get you in with Dr Scarlette Calico Return as needed

## 2013-03-31 NOTE — Assessment & Plan Note (Signed)
Hx of unprovoked PE DVT  Plan Coumadin for life

## 2013-03-31 NOTE — Assessment & Plan Note (Signed)
Now on chronic armour thyroid for suppression d/t nodules Plan chk tsh Cont armour thyroid

## 2013-03-31 NOTE — Progress Notes (Signed)
Subjective:    Patient ID: Clarence Hopkins, male    DOB: 10/13/1930, 80 y.o.   MRN: [id number]  HPI 80 y.o.M  Breathing has been well.  Pt noted some pain in leg areas.  Occasionally every q6-2months, nerve pain and goes away. Pt had L knee replaced 01/2003.    No melanoma recurrence  Thyroid removed  Clarence Hopkins : melanoma f/u  Pt had disc surgery lumbar, then 6 mo later had C spine work  Basal cell Ca removed 2014  BPH only.   No CA  Coumadin: takes 5mg  daily.  That is the dose   Past Medical History  Diagnosis Date  . Pulmonary embolism 2005 & 2007    protein C deficiency  . Thyroid disease 1971    hypothyrodism post operatively  . Cancer 2003    melanoma  L ear     Family History  Problem Relation Age of Onset  . Breast cancer Mother   . Alzheimer's disease Father   . Breast cancer Sister   . Breast cancer Maternal Grandmother   . Diabetes Neg Hx   . Stroke Neg Hx   . Heart disease Neg Hx      History   Social History  . Marital Status: Married    Spouse Name: N/A    Number of Children: N/A  . Years of Education: N/A   Occupational History  . Not on file.   Social History Main Topics  . Smoking status: Former Smoker -- 0.75 packs/day for 41 years    Types: Cigarettes    Quit date: 02/13/1993  . Smokeless tobacco: Never Used     Comment: 17 years ago as of 2012   . Alcohol Use: Yes     Comment:  Vodka 2 oz/ night  . Drug Use: No  . Sexual Activity: Not on file   Other Topics Concern  . Not on file   Social History Narrative  . No narrative on file     No Known Allergies   Outpatient Prescriptions Prior to Visit  Medication Sig Dispense Refill  . Tamsulosin HCl (FLOMAX) 0.4 MG CAPS Take 0.4 mg by mouth daily.       Marland Kitchen warfarin (COUMADIN) 5 MG tablet Take as directed by anticoagulation clinic  90 tablet  0  . thyroid (ARMOUR) 120 MG tablet 1 by mouth daily EXCEPT 2 on Thur Take 120 mg by mouth.  500 tablet  0   No facility-administered  medications prior to visit.       Review of Systems  Constitutional: Negative for fever, chills, diaphoresis, activity change, appetite change, fatigue and unexpected weight change.  HENT: Positive for rhinorrhea and voice change. Negative for congestion, dental problem, ear discharge, ear pain, facial swelling, hearing loss, mouth sores, nosebleeds, postnasal drip, sinus pressure, sneezing, sore throat, tinnitus and trouble swallowing.   Eyes: Negative for photophobia, discharge, itching and visual disturbance.  Respiratory: Negative for apnea, cough, choking, chest tightness, shortness of breath, wheezing and stridor.   Cardiovascular: Negative for chest pain, palpitations and leg swelling.  Gastrointestinal: Negative for nausea, vomiting, abdominal pain, constipation, blood in stool and abdominal distention.  Genitourinary: Positive for difficulty urinating. Negative for dysuria, urgency, frequency, hematuria, flank pain and decreased urine volume.  Musculoskeletal: Positive for neck pain and neck stiffness. Negative for arthralgias, back pain, gait problem, joint swelling and myalgias.  Skin: Negative for color change, pallor and rash.  Neurological: Positive for dizziness and light-headedness. Negative for  tremors, seizures, syncope, speech difficulty, weakness, numbness and headaches.  Hematological: Negative for adenopathy. Does not bruise/bleed easily.  Psychiatric/Behavioral: Negative for confusion, sleep disturbance and agitation. The patient is not nervous/anxious.        Objective:   Physical Exam Filed Vitals:   03/31/13 1413  BP: 128/84  Pulse: 71  Temp: 97.8 F (36.6 C)  TempSrc: Oral  Height: 6\' 4"  (1.93 m)  Weight: 210 lb 12.8 oz (95.618 kg)  SpO2: 97%    Gen: Pleasant, well-nourished, in no distress,  normal affect  ENT: No lesions,  mouth clear,  oropharynx clear, no postnasal drip  Neck: No JVD, no TMG, no carotid bruits  Lungs: No use of accessory  muscles, no dullness to percussion, clear without rales or rhonchi  Cardiovascular: RRR, heart sounds normal, no murmur or gallops, no peripheral edema  Abdomen: soft and NT, no HSM,  BS normal  Musculoskeletal: No deformities, no cyanosis or clubbing  Neuro: alert, non focal  Skin: Warm, ant sternum lesion ?excoriation, doubt melanoma  No results found.        Assessment & Plan:   HYPOTHYROIDISM Now on chronic armour thyroid for suppression d/t nodules Plan chk tsh Cont armour thyroid  Basal cell cancer Hx of basal cell Ca of LE New lesion on anterior sternum, looks inflammatory Suggested to pt to see Derm MD Dr Ronnald Ramp  Unspecified disorder of prostate No new issues Cont tamsulosin daily chk psa  PROTEIN C DEFICIENCY Hx of unprovoked PE/DVT with prot C deficiency On coumadin for life Plan Cont coumadin clinic 5mg /d  Recheck pt/inr chk cbc and cmet   PULMONARY EMBOLISM, HX OF Hx of unprovoked PE DVT  Plan Coumadin for life  Melanoma R antecubital melanoma 2014 New lesion anterior chest, asked pt to see Derm re this issue Will try to find a  pcp to replace dr hopper Will also chk psa/lipids  Updated Medication List Outpatient Encounter Prescriptions as of 03/31/2013  Medication Sig  . Tamsulosin HCl (FLOMAX) 0.4 MG CAPS Take 0.4 mg by mouth daily.   Marland Kitchen thyroid (ARMOUR) 120 MG tablet 1 by mouth daily  . warfarin (COUMADIN) 5 MG tablet Take as directed by anticoagulation clinic  . [DISCONTINUED] thyroid (ARMOUR) 120 MG tablet 1 by mouth daily EXCEPT 2 on Thur Take 120 mg by mouth.

## 2013-04-02 LAB — LDL CHOLESTEROL, DIRECT: Direct LDL: 116 mg/dL

## 2013-04-09 ENCOUNTER — Telehealth: Payer: Self-pay | Admitting: General Practice

## 2013-04-09 ENCOUNTER — Ambulatory Visit (INDEPENDENT_AMBULATORY_CARE_PROVIDER_SITE_OTHER): Payer: Medicare Other | Admitting: General Practice

## 2013-04-09 DIAGNOSIS — Z86718 Personal history of other venous thrombosis and embolism: Secondary | ICD-10-CM

## 2013-04-09 DIAGNOSIS — Z5181 Encounter for therapeutic drug level monitoring: Secondary | ICD-10-CM

## 2013-04-09 LAB — POCT INR: INR: 2

## 2013-04-09 MED ORDER — ENOXAPARIN SODIUM 150 MG/ML ~~LOC~~ SOLN: 150.0000 mg | SUBCUTANEOUS | Status: DC

## 2013-04-09 NOTE — Patient Instructions (Addendum)
Instructions for coumadin and Lovenox pre and post surgery.  2/28 - Take last dose of coumadin 3/1 - Nothing 3/2 - Lovenox  in AM 3/3 - Lovenox in AM 3/4 - Lovenox in AM 3/5 - Procedure (No Lovenox) 3/6 - Take Lovenox in AM and take 7.5 mg of coumadin 3/7 - Take Lovenox in AM and take 7.5 mg of coumadin 3/8 - Take Lovenox in AM and take 5 mg of coumadin 3/9 - Take Lovenox in AM and take 5 mg of coumadin. 3/10 - Re-check in clinic.

## 2013-04-09 NOTE — Telephone Encounter (Signed)
   Why is he on Lovenox ? Who manages his warfarin therapy ? Thanks, [employer]

## 2013-04-09 NOTE — Telephone Encounter (Signed)
OK for Lovenox per Dr. Linna Darner.

## 2013-04-09 NOTE — Progress Notes (Signed)
Pre visit review using our clinic review tool, if applicable. No additional management support is needed unless otherwise documented below in the visit note. 

## 2013-04-10 ENCOUNTER — Other Ambulatory Visit: Payer: Self-pay | Admitting: Orthopedic Surgery

## 2013-04-14 DIAGNOSIS — M19049 Primary osteoarthritis, unspecified hand: Secondary | ICD-10-CM | POA: Diagnosis not present

## 2013-04-16 ENCOUNTER — Encounter (HOSPITAL_BASED_OUTPATIENT_CLINIC_OR_DEPARTMENT_OTHER): Payer: Self-pay | Admitting: *Deleted

## 2013-04-16 NOTE — Progress Notes (Signed)
Pt is on coumadin for hx PE x2-dr wright manages it-he has been on lovenox for 3 days-pt just put on or schedule 445pm 04/16/13. He will come in early to get inr

## 2013-04-16 NOTE — H&P (Signed)
Clarence Hopkins is an 80 y.o. male.   Chief Complaint: c/o chronic and progressive pain right index finger MCP joint HPI: Clarence Hopkins presents for evaluation of his painful right index finger MCP joint. Clarence Hopkins has been experiencing progressive arthrosis of his index MP for several years. He primarily experiences symptoms when he shakes hands with individuals.He has had days where it is so painful he cannot even begin to play golf even placing a tee in the ground and other days he has no pain. Clarence Hopkins cannot use nonsteroidal medication due to the fact he is on chronic Coumadin anti-coagulation due to an abnormal blood protein and a history of pulmonary emboli. He is followed by Lyda Jester. We last injected this MP joint in February 2013. He has had 14 months of relief. Recently he has had quite a bit of discomfort during social encounters.   Past Medical History  Diagnosis Date  . Pulmonary embolism 2005 & 2007    protein C deficiency  . Thyroid disease 1971    hypothyrodism post operatively  . Cancer 2003    melanoma  L ear  . Wears glasses   . Arthritis   . Hypothyroidism     Past Surgical History  Procedure Laterality Date  . Total knee arthroplasty  2004    left  . Thyroidectomy  1971    cytology indefinite  . Cervical fusion       X 2; Dr Vertell Limber  . Lumbar laminectomy  2010    Dr Becky Sax  . Melanoma excision  2003    L ear  . Tonsillectomy    . Colonoscopy      Family History  Problem Relation Age of Onset  . Breast cancer Mother   . Alzheimer's disease Father   . Breast cancer Sister   . Breast cancer Maternal Grandmother   . Diabetes Neg Hx   . Stroke Neg Hx   . Heart disease Neg Hx    Social History:  reports that he quit smoking about 20 years ago. His smoking use included Cigarettes. He has a 30.75 pack-year smoking history. He has never used smokeless tobacco. He reports that he drinks alcohol. He reports that he does not use illicit drugs.  Allergies: No Known  Allergies  No prescriptions prior to admission    No results found for this or any previous visit (from the past 48 hour(s)).  No results found.   Pertinent items are noted in HPI.  Height 6\' 4"  (1.93 m), weight 90.719 kg (200 lb).  General appearance: alert Head: Normocephalic, without obvious abnormality Neck: supple, symmetrical, trachea midline Resp: clear to auscultation bilaterally Cardio: regular rate and rhythm GI: normal findings: bowel sounds normal Extremities: On exam at this time he has 3+ swelling. He has pain with any extension beyond neutral and any flexion beyond 30 degrees.  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  Clarence Hopkins 04/16/2013, 4:49 PM   H&P documentation: 04/17/2013  -History and Physical Reviewed  -Patient has been re-examined  -No change in the plan of care  Cammie Sickle, MD

## 2013-04-17 ENCOUNTER — Encounter (HOSPITAL_BASED_OUTPATIENT_CLINIC_OR_DEPARTMENT_OTHER): Payer: Self-pay | Admitting: *Deleted

## 2013-04-17 ENCOUNTER — Encounter (HOSPITAL_BASED_OUTPATIENT_CLINIC_OR_DEPARTMENT_OTHER): Payer: Medicare Other | Admitting: Anesthesiology

## 2013-04-17 ENCOUNTER — Encounter (HOSPITAL_BASED_OUTPATIENT_CLINIC_OR_DEPARTMENT_OTHER)
Admission: RE | Disposition: A | Discharge status: Home / Self Care | Payer: Self-pay | Source: Ambulatory Visit | Attending: Orthopedic Surgery

## 2013-04-17 ENCOUNTER — Ambulatory Visit (HOSPITAL_BASED_OUTPATIENT_CLINIC_OR_DEPARTMENT_OTHER): Payer: Medicare Other | Admitting: Anesthesiology

## 2013-04-17 ENCOUNTER — Ambulatory Visit (HOSPITAL_BASED_OUTPATIENT_CLINIC_OR_DEPARTMENT_OTHER)
Admission: RE | Admit: 2013-04-17 | Discharge: 2013-04-17 | Disposition: A | Discharge status: Home / Self Care | Payer: Medicare Other | Source: Ambulatory Visit | Attending: Orthopedic Surgery | Admitting: Orthopedic Surgery

## 2013-04-17 DIAGNOSIS — Z86711 Personal history of pulmonary embolism: Secondary | ICD-10-CM | POA: Insufficient documentation

## 2013-04-17 DIAGNOSIS — Z7901 Long term (current) use of anticoagulants: Secondary | ICD-10-CM | POA: Diagnosis not present

## 2013-04-17 DIAGNOSIS — E890 Postprocedural hypothyroidism: Secondary | ICD-10-CM | POA: Insufficient documentation

## 2013-04-17 DIAGNOSIS — Z87891 Personal history of nicotine dependence: Secondary | ICD-10-CM | POA: Diagnosis not present

## 2013-04-17 DIAGNOSIS — D6859 Other primary thrombophilia: Secondary | ICD-10-CM | POA: Insufficient documentation

## 2013-04-17 DIAGNOSIS — G8929 Other chronic pain: Secondary | ICD-10-CM | POA: Insufficient documentation

## 2013-04-17 DIAGNOSIS — M19049 Primary osteoarthritis, unspecified hand: Secondary | ICD-10-CM | POA: Diagnosis not present

## 2013-04-17 HISTORY — PX: FINGER ARTHROPLASTY: SHX5017

## 2013-04-17 HISTORY — DX: Hypothyroidism, unspecified: E03.9

## 2013-04-17 HISTORY — DX: Unspecified osteoarthritis, unspecified site: M19.90

## 2013-04-17 LAB — PROTIME-INR
INR: 1.27 (ref 0.00–1.49)
PROTHROMBIN TIME: 15.6 s — AB (ref 11.6–15.2)

## 2013-04-17 LAB — APTT: aPTT: 37 seconds (ref 24–37)

## 2013-04-17 SURGERY — ARTHROPLASTY, FINGER
Anesthesia: General | Site: Finger | Laterality: Right

## 2013-04-17 MED ORDER — OXYCODONE HCL 5 MG PO TABS
ORAL_TABLET | ORAL | Status: AC
  Filled 2013-04-17: qty 1

## 2013-04-17 MED ORDER — OXYCODONE HCL 5 MG PO TABS
5.0000 mg | ORAL_TABLET | Freq: Once | ORAL | Status: AC | PRN
  Administered 2013-04-17: 5 mg via ORAL

## 2013-04-17 MED ORDER — CEPHALEXIN 500 MG PO CAPS: 500.0000 mg | ORAL_CAPSULE | Freq: Three times a day (TID) | ORAL | Status: DC

## 2013-04-17 MED ORDER — CHLORHEXIDINE GLUCONATE 4 % EX LIQD: 60.0000 mL | Freq: Once | CUTANEOUS | Status: DC

## 2013-04-17 MED ORDER — ONDANSETRON HCL 4 MG/2ML IJ SOLN
INTRAMUSCULAR | Status: DC | PRN
  Administered 2013-04-17: 4 mg via INTRAVENOUS

## 2013-04-17 MED ORDER — DEXAMETHASONE SODIUM PHOSPHATE 4 MG/ML IJ SOLN
INTRAMUSCULAR | Status: DC | PRN
  Administered 2013-04-17: 10 mg via INTRAVENOUS

## 2013-04-17 MED ORDER — OXYCODONE HCL 5 MG/5ML PO SOLN: 5.0000 mg | Freq: Once | ORAL | Status: AC | PRN

## 2013-04-17 MED ORDER — MIDAZOLAM HCL 2 MG/2ML IJ SOLN: 1.0000 mg | INTRAMUSCULAR | Status: DC | PRN

## 2013-04-17 MED ORDER — FENTANYL CITRATE 0.05 MG/ML IJ SOLN
INTRAMUSCULAR | Status: DC | PRN
  Administered 2013-04-17: 50 ug via INTRAVENOUS
  Administered 2013-04-17: 100 ug via INTRAVENOUS

## 2013-04-17 MED ORDER — HYDROMORPHONE HCL 2 MG PO TABS: ORAL_TABLET | ORAL | Status: DC

## 2013-04-17 MED ORDER — CEFAZOLIN SODIUM-DEXTROSE 2-3 GM-% IV SOLR
2.0000 g | Freq: Once | INTRAVENOUS | Status: AC
  Administered 2013-04-17: 2 g via INTRAVENOUS

## 2013-04-17 MED ORDER — METOCLOPRAMIDE HCL 5 MG/ML IJ SOLN: 10.0000 mg | Freq: Once | INTRAMUSCULAR | Status: DC | PRN

## 2013-04-17 MED ORDER — LIDOCAINE HCL (CARDIAC) 20 MG/ML IV SOLN
INTRAVENOUS | Status: DC | PRN
  Administered 2013-04-17: 60 mg via INTRAVENOUS

## 2013-04-17 MED ORDER — LACTATED RINGERS IV SOLN
INTRAVENOUS | Status: DC
  Administered 2013-04-17 (×2): via INTRAVENOUS

## 2013-04-17 MED ORDER — EPHEDRINE SULFATE 50 MG/ML IJ SOLN
INTRAMUSCULAR | Status: DC | PRN
  Administered 2013-04-17: 15 mg via INTRAVENOUS

## 2013-04-17 MED ORDER — HYDROMORPHONE HCL PF 1 MG/ML IJ SOLN
0.2500 mg | INTRAMUSCULAR | Status: DC | PRN
  Administered 2013-04-17 (×2): 0.5 mg via INTRAVENOUS

## 2013-04-17 MED ORDER — HYDROMORPHONE HCL PF 1 MG/ML IJ SOLN
INTRAMUSCULAR | Status: AC
  Filled 2013-04-17: qty 1

## 2013-04-17 MED ORDER — PROPOFOL 10 MG/ML IV BOLUS
INTRAVENOUS | Status: DC | PRN
  Administered 2013-04-17: 150 mg via INTRAVENOUS

## 2013-04-17 MED ORDER — FENTANYL CITRATE 0.05 MG/ML IJ SOLN: 50.0000 ug | INTRAMUSCULAR | Status: DC | PRN

## 2013-04-17 MED ORDER — LIDOCAINE HCL 2 % IJ SOLN
INTRAMUSCULAR | Status: DC | PRN
  Administered 2013-04-17: 4.5 mL

## 2013-04-17 MED ORDER — FENTANYL CITRATE 0.05 MG/ML IJ SOLN
INTRAMUSCULAR | Status: AC
  Filled 2013-04-17: qty 6

## 2013-04-17 SURGICAL SUPPLY — 81 items
BAG DECANTER FOR FLEXI CONT (MISCELLANEOUS) IMPLANT
BANDAGE ADH SHEER 1  50/CT (GAUZE/BANDAGES/DRESSINGS) IMPLANT
BANDAGE COBAN STERILE 2 (GAUZE/BANDAGES/DRESSINGS) IMPLANT
BANDAGE ELASTIC 3 VELCRO ST LF (GAUZE/BANDAGES/DRESSINGS) ×4 IMPLANT
BLADE AVERAGE 25X9 (BLADE) ×2 IMPLANT
BLADE MINI RND TIP GREEN BEAV (BLADE) IMPLANT
BLADE OSC/SAG .038X5.5 CUT EDG (BLADE) IMPLANT
BLADE SURG 15 STRL LF DISP TIS (BLADE) ×2 IMPLANT
BLADE SURG 15 STRL SS (BLADE) ×2
BNDG COHESIVE 1X5 TAN STRL LF (GAUZE/BANDAGES/DRESSINGS) IMPLANT
BNDG COHESIVE 3X5 TAN STRL LF (GAUZE/BANDAGES/DRESSINGS) IMPLANT
BNDG ELASTIC 2 VLCR STRL LF (GAUZE/BANDAGES/DRESSINGS) IMPLANT
BNDG ESMARK 4X9 LF (GAUZE/BANDAGES/DRESSINGS) IMPLANT
BNDG GAUZE ELAST 4 BULKY (GAUZE/BANDAGES/DRESSINGS) ×4 IMPLANT
BRUSH SCRUB EZ PLAIN DRY (MISCELLANEOUS) ×2 IMPLANT
BUR EGG 3PK/BX (BURR) IMPLANT
BUR EGG/OVAL CARBIDE (BURR) IMPLANT
BUR FAST CUTTING MED (BURR) IMPLANT
BUR STRYKER (BURR) IMPLANT
BUR SWANSON PILOT POINT (BURR) ×2 IMPLANT
BUR SWANSON PILOT POINT MED (BURR) ×2 IMPLANT
CORDS BIPOLAR (ELECTRODE) ×2 IMPLANT
COVER MAYO STAND STRL (DRAPES) ×2 IMPLANT
COVER TABLE BACK 60X90 (DRAPES) ×2 IMPLANT
CUFF TOURNIQUET SINGLE 18IN (TOURNIQUET CUFF) ×2 IMPLANT
DECANTER SPIKE VIAL GLASS SM (MISCELLANEOUS) IMPLANT
DRAPE EXTREMITY T 121X128X90 (DRAPE) ×2 IMPLANT
DRAPE OEC MINIVIEW 54X84 (DRAPES) ×2 IMPLANT
DRAPE SURG 17X23 STRL (DRAPES) ×2 IMPLANT
GAUZE SPONGE 4X4 16PLY XRAY LF (GAUZE/BANDAGES/DRESSINGS) IMPLANT
GAUZE XEROFORM 1X8 LF (GAUZE/BANDAGES/DRESSINGS) IMPLANT
GLOVE BIO SURGEON STRL SZ 6.5 (GLOVE) ×2 IMPLANT
GLOVE BIOGEL M STRL SZ7.5 (GLOVE) ×2 IMPLANT
GLOVE BIOGEL PI IND STRL 7.0 (GLOVE) ×1 IMPLANT
GLOVE BIOGEL PI INDICATOR 7.0 (GLOVE) ×1
GLOVE ORTHO TXT STRL SZ7.5 (GLOVE) ×2 IMPLANT
GOWN STRL REUS W/ TWL LRG LVL3 (GOWN DISPOSABLE) ×1 IMPLANT
GOWN STRL REUS W/ TWL XL LVL3 (GOWN DISPOSABLE) ×2 IMPLANT
GOWN STRL REUS W/TWL LRG LVL3 (GOWN DISPOSABLE) ×1
GOWN STRL REUS W/TWL XL LVL3 (GOWN DISPOSABLE) ×2
IMPLANT FINGER JOINT SZ 5 (Finger Joint) ×2 IMPLANT
K-WIRE .035X4 (WIRE) ×2 IMPLANT
NEEDLE 27GAX1X1/2 (NEEDLE) ×2 IMPLANT
NEEDLE BLUNT 17GA (NEEDLE) IMPLANT
NEEDLE HYPO 22GX1.5 SAFETY (NEEDLE) IMPLANT
NS IRRIG 1000ML POUR BTL (IV SOLUTION) ×2 IMPLANT
PACK BASIN DAY SURGERY FS (CUSTOM PROCEDURE TRAY) ×2 IMPLANT
PAD CAST 3X4 CTTN HI CHSV (CAST SUPPLIES) ×2 IMPLANT
PADDING CAST ABS 4INX4YD NS (CAST SUPPLIES) ×1
PADDING CAST ABS COTTON 4X4 ST (CAST SUPPLIES) ×1 IMPLANT
PADDING CAST COTTON 3X4 STRL (CAST SUPPLIES) ×2
PADDING UNDERCAST 2  STERILE (CAST SUPPLIES) IMPLANT
SLEEVE SCD COMPRESS KNEE MED (MISCELLANEOUS) ×2 IMPLANT
SPLINT FINGER 4.25 BULB 911906 (SOFTGOODS) IMPLANT
SPLINT PLASTER CAST XFAST 3X15 (CAST SUPPLIES) ×6 IMPLANT
SPLINT PLASTER XTRA FASTSET 3X (CAST SUPPLIES) ×6
SPONGE GAUZE 4X4 12PLY (GAUZE/BANDAGES/DRESSINGS) ×2 IMPLANT
STOCKINETTE 4X48 STRL (DRAPES) ×2 IMPLANT
STRIP CLOSURE SKIN 1/2X4 (GAUZE/BANDAGES/DRESSINGS) ×2 IMPLANT
SUT ETHIBOND 3-0 V-5 (SUTURE) ×2 IMPLANT
SUT ETHILON 5 0 P 3 18 (SUTURE)
SUT FIBERWIRE 3-0 18 TAPR NDL (SUTURE)
SUT FIBERWIRE 4-0 18 TAPR NDL (SUTURE)
SUT MERSILENE 4 0 P 3 (SUTURE) IMPLANT
SUT NYLON ETHILON 5-0 P-3 1X18 (SUTURE) IMPLANT
SUT PROLENE 3 0 PS 2 (SUTURE) IMPLANT
SUT PROLENE 4 0 P 3 18 (SUTURE) ×2 IMPLANT
SUT STEEL 0 (SUTURE)
SUT STEEL 0 18XMFL TIE 17 (SUTURE) IMPLANT
SUT VIC AB 4-0 P-3 18XBRD (SUTURE) ×1 IMPLANT
SUT VIC AB 4-0 P3 18 (SUTURE) ×1
SUTURE FIBERWR 3-0 18 TAPR NDL (SUTURE) IMPLANT
SUTURE FIBERWR 4-0 18 TAPR NDL (SUTURE) IMPLANT
SYR 20CC LL (SYRINGE) IMPLANT
SYR 3ML 23GX1 SAFETY (SYRINGE) IMPLANT
SYR BULB 3OZ (MISCELLANEOUS) ×2 IMPLANT
SYR CONTROL 10ML LL (SYRINGE) ×2 IMPLANT
TOWEL OR 17X24 6PK STRL BLUE (TOWEL DISPOSABLE) ×2 IMPLANT
TRAY DSU PREP LF (CUSTOM PROCEDURE TRAY) ×2 IMPLANT
TUBE CONNECTING 20X1/4 (TUBING) ×2 IMPLANT
UNDERPAD 30X30 INCONTINENT (UNDERPADS AND DIAPERS) ×2 IMPLANT

## 2013-04-17 NOTE — Op Note (Signed)
[id number] 

## 2013-04-17 NOTE — Addendum Note (Signed)
Addendum created 04/17/13 1723 by Tawni Millers, CRNA   Modules edited: Charges VN

## 2013-04-17 NOTE — Anesthesia Postprocedure Evaluation (Signed)
Anesthesia Post Note  Patient: Clarence Hopkins  Procedure(s) Performed: Procedure(s) (LRB): IMPLANT ARTHROPLASTY RIGHT INDEX (Right)  Anesthesia type: General  Patient location: PACU  Post pain: Pain level controlled  Post assessment: Patient's Cardiovascular Status Stable  Last Vitals:  Filed Vitals:   04/17/13 1415  BP: 137/76  Pulse: 71  Temp:   Resp: 19    Post vital signs: Reviewed and stable  Level of consciousness: alert  Complications: No apparent anesthesia complications

## 2013-04-17 NOTE — Anesthesia Preprocedure Evaluation (Signed)
Anesthesia Evaluation  Patient identified by MRN, date of birth, ID band Patient awake    Reviewed: Allergy & Precautions, H&P , NPO status , Patient's Chart, lab work & pertinent test results, reviewed documented beta blocker date and time   Airway Mallampati: II TM Distance: >3 FB Neck ROM: full    Dental   Pulmonary neg pulmonary ROS, former smoker,  breath sounds clear to auscultation        Cardiovascular negative cardio ROS  Rhythm:regular     Neuro/Psych negative neurological ROS  negative psych ROS   GI/Hepatic negative GI ROS, Neg liver ROS,   Endo/Other  Hypothyroidism   Renal/GU negative Renal ROS  negative genitourinary   Musculoskeletal   Abdominal   Peds  Hematology   Anesthesia Other Findings See surgeon's H&P   Reproductive/Obstetrics negative OB ROS                           Anesthesia Physical Anesthesia Plan  ASA: III  Anesthesia Plan: General   Post-op Pain Management:    Induction: Intravenous  Airway Management Planned: LMA  Additional Equipment:   Intra-op Plan:   Post-operative Plan:   Informed Consent: I have reviewed the patients History and Physical, chart, labs and discussed the procedure including the risks, benefits and alternatives for the proposed anesthesia with the patient or authorized representative who has indicated his/her understanding and acceptance.   Dental Advisory Given  Plan Discussed with: CRNA and Surgeon  Anesthesia Plan Comments:         Anesthesia Quick Evaluation

## 2013-04-17 NOTE — Anesthesia Procedure Notes (Signed)
Procedure Name: LMA Insertion Performed by: Terrance Mass Pre-anesthesia Checklist: Patient identified, Timeout performed, Emergency Drugs available, Suction available and Patient being monitored Patient Re-evaluated:Patient Re-evaluated prior to inductionOxygen Delivery Method: Circle system utilized Preoxygenation: Pre-oxygenation with 100% oxygen Intubation Type: IV induction Ventilation: Mask ventilation without difficulty LMA: LMA inserted LMA Size: 4.0 Number of attempts: 1 Placement Confirmation: breath sounds checked- equal and bilateral and positive ETCO2 Tube secured with: Tape Dental Injury: Teeth and Oropharynx as per pre-operative assessment

## 2013-04-17 NOTE — Brief Op Note (Signed)
04/17/2013  2:05 PM  PATIENT:  Clarence Hopkins  80 y.o. male  PRE-OPERATIVE DIAGNOSIS:  RIGHT INDEX DEGENERATIVE JOINT DISEASE  POST-OPERATIVE DIAGNOSIS:  RIGHT INDEX DEGENERATIVE JOINT DISEASE  PROCEDURE:  Procedure(s): IMPLANT ARTHROPLASTY RIGHT INDEX (Right)  SURGEON:  Surgeon(s) and Role:    * Cammie Sickle., MD - Primary  PHYSICIAN ASSISTANT:   ASSISTANTS: Kathyrn Sheriff.A-C    ANESTHESIA:   general  EBL:  Total I/O In: 2100 [I.V.:2100] Out: -   BLOOD ADMINISTERED:none  DRAINS: none   LOCAL MEDICATIONS USED:  XYLOCAINE   SPECIMEN:  No Specimen  DISPOSITION OF SPECIMEN:  N/A  COUNTS:  YES  TOURNIQUET:   Total Tourniquet Time Documented: Upper Arm (Right) - 62 minutes Total: Upper Arm (Right) - 62 minutes   DICTATION: .Other Dictation: Dictation Number [id number]  PLAN OF CARE: Discharge to home after PACU  PATIENT DISPOSITION:  PACU - hemodynamically stable.   Delay start of Pharmacological VTE agent (>24hrs) due to surgical blood loss or risk of bleeding: not applicable

## 2013-04-17 NOTE — Discharge Instructions (Signed)
°  Post Anesthesia Home Care Instructions  Activity: Get plenty of rest for the remainder of the day. A responsible adult should stay with you for 24 hours following the procedure.  For the next 24 hours, DO NOT: -Drive a car -Paediatric nurse -Drink alcoholic beverages -Take any medication unless instructed by your physician -Make any legal decisions or sign important papers.  Meals: Start with liquid foods such as gelatin or soup. Progress to regular foods as tolerated. Avoid greasy, spicy, heavy foods. If nausea and/or vomiting occur, drink only clear liquids until the nausea and/or vomiting subsides. Call your physician if vomiting continues.  Special Instructions/Symptoms: Your throat may feel dry or sore from the anesthesia or the breathing tube placed in your throat during surgery. If this causes discomfort, gargle with warm salt water. The discomfort should disappear within 24 hours.      [facility] Instructions Hand Surgery  Wound Care: Keep your hand elevated above the level of your heart.  Do not allow it to dangle by your side.  Keep the dressing dry and do not remove it unless your doctor advises you to do so.  He will usually change it at the time of your post-op visit.  Moving your fingers is advised to stimulate circulation but will depend on the site of your surgery.  If you have a splint applied, your doctor will advise you regarding movement.  Activity: Do not drive or operate machinery today.  Rest today and then you may return to your normal activity and work as indicated by your physician.  Diet:  Drink liquids today or eat a light diet.  You may resume a regular diet tomorrow.    General expectations: Pain for two to three days. Fingers may become slightly swollen.  Call your doctor if any of the following occur: Severe pain not relieved by pain medication. Elevated temperature. Dressing soaked with blood. Inability to move fingers. White or bluish  color to fingers.  FOLLOW THE COUMADIN CLINIC INSTRUCTIONS REGARDING LOVENOX AND WARFARIN DOSING FOR THE NEXT 7 DAYS Call our office for medication or dressing concerns.  [phone]

## 2013-04-17 NOTE — Transfer of Care (Signed)
Immediate Anesthesia Transfer of Care Note  Patient: Clarence Hopkins  Procedure(s) Performed: Procedure(s): IMPLANT ARTHROPLASTY RIGHT INDEX (Right)  Patient Location: PACU  Anesthesia Type:General  Level of Consciousness: awake, alert  and oriented  Airway & Oxygen Therapy: Patient Spontanous Breathing and Patient connected to face mask oxygen  Post-op Assessment: Report given to PACU RN and Post -op Vital signs reviewed and stable  Post vital signs: Reviewed and stable  Complications: No apparent anesthesia complications

## 2013-04-18 ENCOUNTER — Encounter (HOSPITAL_BASED_OUTPATIENT_CLINIC_OR_DEPARTMENT_OTHER): Payer: Self-pay | Admitting: Orthopedic Surgery

## 2013-04-18 NOTE — Op Note (Signed)
NAME:  Clarence Hopkins, DUVAL NO.:  [id number]  MEDICAL RECORD NO.:  [id number]  LOCATION:                                 FACILITY:  PHYSICIAN:  Youlanda Mighty. Kenise Barraco, M.D.      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  OPERATION:  Right index finger implant arthroplasty utilizing a size 5 Wright [employer] silicone implant with reconstruction of index finger MP joint radial collateral ligament.  OPERATING SURGEON:  Youlanda Mighty. Corita Allinson, M.D.  ASSISTANT:  Marily Lente. [name], PA-C.  ANESTHESIA:  General by LMA.  SUPERVISING ANESTHESIOLOGIST:  Jessy Oto. Albertina Parr, M.D.  INDICATIONS:  Clarence Hopkins is an 80 year old right-hand dominant retired Copywriter, advertising and engaged citizen of [place].  He has a history of increasing pain in his right index metacarpophalangeal joint dating back to 2010.  He initially consulted in September 2010, and was noted to have severe osteoarthritis of the right index finger metacarpophalangeal joint likely due to a chronic collateral ligament injury.  We have treated Mr. Montminy for more than 4 years with occasional steroid injections and activity modification.  He has reached the point where he cannot shake hands without severe pain and has only a 30-degree arc of motion from a 5-degree flexion fracture to further flexion of 35 degrees.  He has other significant confounding background medical problems, which have led him to wait until this time to proceed with surgery.  He has a protein C deficiency and has had episodes of deep vein thrombosis.  He is  managed by Dr. Asencion Noble of [facility].  He is on chronic warfarin anticoagulation.  Dr. Joya Gaskins and the nurses at the [facility] have designed a program for him to hold his Coumadin using Lovenox 3 days prior to surgery and beginning his Coumadin the day following surgery.  Mr. Gancarz has been provided detailed instructions by Dr. Bettina Gavia staff and is followed them.  In the holding area of the [facility], he was interviewed by Dr. Albertina Parr, who advised general anesthesia by LMA.  We declined a block due to his history of anticoagulation and the fact that we can adequately create anesthesia with a local block of the hand.  Preoperatively, questions regarding the anticipated procedure were invited and answered in detail.  Mr. Chiu right hand and index metacarpophalangeal joint were marked as the proper surgical site per protocol with a marking pen.  DESCRIPTION OF PROCEDURE:  Joan Avetisyan was transferred to room #6 of the [facility] and placed in supine position on the operating table.  Following induction of general anesthesia by LMA technique under Dr. Roslynn Amble direct supervision, 2 g of Ancef were administered as an IV prophylactic antibiotic. Passive compression devices had been applied to Mr. zakariya knickerbocker prior to the induction of anesthesia.  The right hand and arm were prepped with Betadine soap and solution, sterilely draped.  A pneumatic tourniquet was applied to the proximal right brachium.  Following exsanguination of the right arm with Esmarch bandage, the arterial tourniquet was inflated to 220 mmHg.  Following routine surgical time-out, the procedure commenced with a curvilinear incision exposing the extensor mechanism overlying the index metacarpophalangeal joint.  The extensor was split between the extensor indicis proprius and the common extensor to the index finger.  The capsule was  isolated and split longitudinally.  The radial and ulnar collateral ligaments were elevated off the deformed metacarpal head.  The capsule was moderately thickened.  Steroid residue was noted.  The collateral ligaments were released enough to allow opening the joint shotgun style.  There was complete loss of hyaline articular cartilage on the metacarpal head and on the base of the proximal phalanx.  A rongeur was used to perform a complete synovectomy followed by careful debridement of the index metacarpophalangeal joint sesamoid that was quite hypertrophic and would likely impinge the implant.  Care was taken to perform a complete synovectomy and removal of osteophytes on the palmar aspect of the metacarpal head.  An oscillating saw was used to resect the metacarpal head to the level of the distal aspect of the origin of the collateral ligaments taking care to slightly cant the bone resection into radial deviation of the metacarpophalangeal joint.  The intramedullary canal of the proximal phalanx and the metacarpal was sounded with a sharp awl followed by use of a Swanson high-speed reamer with a high-speed drill to machine the base of the proximal phalanx.  The metacarpal was prepared by hand reaming and rasping.  A pair of size 5 proximal and distal reamers were used to prepare the metacarpal intramedullary canal and intramedullary canal of the proximal phalanx.  The trial was placed and found to be satisfactory.  We then created two 0.035-inch drill holes at the anatomic origin of the radial collateral ligament, subsequently placed a 3-0 FiberWire gathering suture accordion style into the radial collateral ligament and placed a size 5 [employer] flexible hinged implant with no touch technique into the proximal phalanx and metacarpal intramedullary canals.  The capsule was then repaired with interrupted sutures of 4-0 Vicryl.  The collateral ligament repair was then tensioned to  allow 5 degrees of radial deviation and slight supination of the index finger. We then performed an anatomic repair of the capsule followed by anatomic repair of the extensor mechanism with 3-0 Ethibond knots buried followed by a finishing suture of 3-0 Ethibond.  The skin was repaired with subcutaneous 4-0 Vicryl and intradermal 3-0 Prolene with Steri-Strips. The wound was infiltrated with 2% lidocaine for postoperative analgesia. The hand, wrist and finger were then immobilized in a short-arm splint with the index finger supported at 30 degrees flexion of the MP joint and full extension of the IP joints.  There were no apparent complications.  For aftercare, Mr. Razon was provided prescriptions for Keflex 500 mg 1 p.o. q.8 hours x 4 days as a prophylactic antibiotic.  Also, he was provided Dilaudid 2 mg one or two tablets p.o. q.4-6 hours p.r.n. pain, 30 tablets without refill.  He will resume his Coumadin per Dr. Bettina Gavia recommendations on April 18, 2013.  He will continue his Lovenox bridge for 4 days.  During our postoperative conference with the family, I encouraged Mr. Sargent through his son and wife not to use Aleve, Advil or other nonsteroidal medication in  the postoperative period due to his anticoagulation.     Youlanda Mighty Makisha Marrin, M.D.     RVS/MEDQ  D:  04/17/2013  T:  04/18/2013  Job:  [id number]  cc:   Burnett Harry. Joya Gaskins, MD, FCCP

## 2013-04-22 ENCOUNTER — Ambulatory Visit (INDEPENDENT_AMBULATORY_CARE_PROVIDER_SITE_OTHER): Payer: Medicare Other | Admitting: General Practice

## 2013-04-22 DIAGNOSIS — Z5181 Encounter for therapeutic drug level monitoring: Secondary | ICD-10-CM | POA: Diagnosis not present

## 2013-04-22 DIAGNOSIS — Z86718 Personal history of other venous thrombosis and embolism: Secondary | ICD-10-CM | POA: Diagnosis not present

## 2013-04-22 LAB — POCT INR: INR: 1.8

## 2013-04-22 NOTE — Progress Notes (Signed)
Pre visit review using our clinic review tool, if applicable. No additional management support is needed unless otherwise documented below in the visit note. 

## 2013-04-25 DIAGNOSIS — M19049 Primary osteoarthritis, unspecified hand: Secondary | ICD-10-CM | POA: Diagnosis not present

## 2013-05-08 ENCOUNTER — Other Ambulatory Visit: Payer: Self-pay | Admitting: Dermatology

## 2013-05-08 DIAGNOSIS — D485 Neoplasm of uncertain behavior of skin: Secondary | ICD-10-CM | POA: Diagnosis not present

## 2013-05-08 DIAGNOSIS — Z85828 Personal history of other malignant neoplasm of skin: Secondary | ICD-10-CM | POA: Diagnosis not present

## 2013-05-08 DIAGNOSIS — L821 Other seborrheic keratosis: Secondary | ICD-10-CM | POA: Diagnosis not present

## 2013-05-08 DIAGNOSIS — C44519 Basal cell carcinoma of skin of other part of trunk: Secondary | ICD-10-CM | POA: Diagnosis not present

## 2013-05-22 ENCOUNTER — Other Ambulatory Visit: Payer: Self-pay | Admitting: General Practice

## 2013-05-22 MED ORDER — WARFARIN SODIUM 5 MG PO TABS: ORAL_TABLET | ORAL | Status: DC

## 2013-05-25 ENCOUNTER — Encounter: Payer: Self-pay | Admitting: Internal Medicine

## 2013-06-04 ENCOUNTER — Ambulatory Visit (INDEPENDENT_AMBULATORY_CARE_PROVIDER_SITE_OTHER): Payer: Medicare Other | Admitting: General Practice

## 2013-06-04 DIAGNOSIS — Z86718 Personal history of other venous thrombosis and embolism: Secondary | ICD-10-CM

## 2013-06-04 DIAGNOSIS — Z5181 Encounter for therapeutic drug level monitoring: Secondary | ICD-10-CM | POA: Diagnosis not present

## 2013-06-04 LAB — POCT INR: INR: 2.4

## 2013-06-04 NOTE — Progress Notes (Signed)
Pre visit review using our clinic review tool, if applicable. No additional management support is needed unless otherwise documented below in the visit note. 

## 2013-07-16 ENCOUNTER — Ambulatory Visit (INDEPENDENT_AMBULATORY_CARE_PROVIDER_SITE_OTHER): Payer: Medicare Other | Admitting: General Practice

## 2013-07-16 DIAGNOSIS — Z5181 Encounter for therapeutic drug level monitoring: Secondary | ICD-10-CM

## 2013-07-16 DIAGNOSIS — Z86718 Personal history of other venous thrombosis and embolism: Secondary | ICD-10-CM

## 2013-07-16 LAB — POCT INR: INR: 2

## 2013-07-16 NOTE — Progress Notes (Signed)
Pre visit review using our clinic review tool, if applicable. No additional management support is needed unless otherwise documented below in the visit note. 

## 2013-07-17 ENCOUNTER — Other Ambulatory Visit: Payer: Self-pay | Admitting: Dermatology

## 2013-07-17 DIAGNOSIS — Z85828 Personal history of other malignant neoplasm of skin: Secondary | ICD-10-CM | POA: Diagnosis not present

## 2013-07-17 DIAGNOSIS — C44519 Basal cell carcinoma of skin of other part of trunk: Secondary | ICD-10-CM | POA: Diagnosis not present

## 2013-07-25 ENCOUNTER — Encounter: Payer: Self-pay | Admitting: Internal Medicine

## 2013-08-27 ENCOUNTER — Ambulatory Visit (INDEPENDENT_AMBULATORY_CARE_PROVIDER_SITE_OTHER): Payer: Medicare Other | Admitting: General Practice

## 2013-08-27 DIAGNOSIS — Z86718 Personal history of other venous thrombosis and embolism: Secondary | ICD-10-CM | POA: Diagnosis not present

## 2013-08-27 DIAGNOSIS — Z5181 Encounter for therapeutic drug level monitoring: Secondary | ICD-10-CM

## 2013-08-27 LAB — POCT INR: INR: 2.6

## 2013-08-27 NOTE — Patient Instructions (Addendum)
Hold coumadin on 8/3 and 8/4 for tooth extraction.  On 8/6 and 8/7 take 7.5 mg and then continue to take 5 mg every day.

## 2013-08-27 NOTE — Progress Notes (Signed)
Pre visit review using our clinic review tool, if applicable. No additional management support is needed unless otherwise documented below in the visit note. 

## 2013-09-01 ENCOUNTER — Other Ambulatory Visit: Payer: Self-pay | Admitting: Dermatology

## 2013-09-01 DIAGNOSIS — L821 Other seborrheic keratosis: Secondary | ICD-10-CM | POA: Diagnosis not present

## 2013-09-01 DIAGNOSIS — C4441 Basal cell carcinoma of skin of scalp and neck: Secondary | ICD-10-CM | POA: Diagnosis not present

## 2013-09-01 DIAGNOSIS — L570 Actinic keratosis: Secondary | ICD-10-CM | POA: Diagnosis not present

## 2013-09-01 DIAGNOSIS — C44319 Basal cell carcinoma of skin of other parts of face: Secondary | ICD-10-CM | POA: Diagnosis not present

## 2013-09-01 DIAGNOSIS — Z85828 Personal history of other malignant neoplasm of skin: Secondary | ICD-10-CM | POA: Diagnosis not present

## 2013-09-01 DIAGNOSIS — D485 Neoplasm of uncertain behavior of skin: Secondary | ICD-10-CM | POA: Diagnosis not present

## 2013-09-01 DIAGNOSIS — L819 Disorder of pigmentation, unspecified: Secondary | ICD-10-CM | POA: Diagnosis not present

## 2013-09-01 DIAGNOSIS — D1801 Hemangioma of skin and subcutaneous tissue: Secondary | ICD-10-CM | POA: Diagnosis not present

## 2013-09-01 DIAGNOSIS — D239 Other benign neoplasm of skin, unspecified: Secondary | ICD-10-CM | POA: Diagnosis not present

## 2013-09-05 ENCOUNTER — Encounter: Payer: Self-pay | Admitting: Internal Medicine

## 2013-09-16 ENCOUNTER — Ambulatory Visit (INDEPENDENT_AMBULATORY_CARE_PROVIDER_SITE_OTHER): Payer: Medicare Other | Admitting: Family Medicine

## 2013-09-16 DIAGNOSIS — Z5181 Encounter for therapeutic drug level monitoring: Secondary | ICD-10-CM | POA: Diagnosis not present

## 2013-09-16 DIAGNOSIS — Z86718 Personal history of other venous thrombosis and embolism: Secondary | ICD-10-CM

## 2013-09-16 LAB — POCT INR: INR: 1.6

## 2013-10-14 ENCOUNTER — Ambulatory Visit (INDEPENDENT_AMBULATORY_CARE_PROVIDER_SITE_OTHER): Payer: Medicare Other | Admitting: *Deleted

## 2013-10-14 DIAGNOSIS — Z5181 Encounter for therapeutic drug level monitoring: Secondary | ICD-10-CM | POA: Diagnosis not present

## 2013-10-14 DIAGNOSIS — Z86718 Personal history of other venous thrombosis and embolism: Secondary | ICD-10-CM

## 2013-10-14 LAB — POCT INR: INR: 2.1

## 2013-10-15 DIAGNOSIS — Z85828 Personal history of other malignant neoplasm of skin: Secondary | ICD-10-CM | POA: Diagnosis not present

## 2013-10-15 DIAGNOSIS — C44319 Basal cell carcinoma of skin of other parts of face: Secondary | ICD-10-CM | POA: Diagnosis not present

## 2013-11-26 ENCOUNTER — Ambulatory Visit (INDEPENDENT_AMBULATORY_CARE_PROVIDER_SITE_OTHER): Payer: Medicare Other | Admitting: *Deleted

## 2013-11-26 DIAGNOSIS — Z86718 Personal history of other venous thrombosis and embolism: Secondary | ICD-10-CM | POA: Diagnosis not present

## 2013-11-26 DIAGNOSIS — Z5181 Encounter for therapeutic drug level monitoring: Secondary | ICD-10-CM

## 2013-11-26 LAB — POCT INR: INR: 2

## 2013-12-03 DIAGNOSIS — Z23 Encounter for immunization: Secondary | ICD-10-CM | POA: Diagnosis not present

## 2013-12-09 ENCOUNTER — Other Ambulatory Visit: Payer: Self-pay | Admitting: Dermatology

## 2013-12-09 DIAGNOSIS — D0422 Carcinoma in situ of skin of left ear and external auricular canal: Secondary | ICD-10-CM | POA: Diagnosis not present

## 2013-12-09 DIAGNOSIS — Z85828 Personal history of other malignant neoplasm of skin: Secondary | ICD-10-CM | POA: Diagnosis not present

## 2013-12-09 DIAGNOSIS — L812 Freckles: Secondary | ICD-10-CM | POA: Diagnosis not present

## 2013-12-09 DIAGNOSIS — D485 Neoplasm of uncertain behavior of skin: Secondary | ICD-10-CM | POA: Diagnosis not present

## 2013-12-09 DIAGNOSIS — L821 Other seborrheic keratosis: Secondary | ICD-10-CM | POA: Diagnosis not present

## 2013-12-09 DIAGNOSIS — L570 Actinic keratosis: Secondary | ICD-10-CM | POA: Diagnosis not present

## 2013-12-17 ENCOUNTER — Telehealth: Payer: Self-pay | Admitting: *Deleted

## 2013-12-17 NOTE — Telephone Encounter (Signed)
Left msg on triage stating needing refill on his armour thyroid. Requesting rx to be mail to address...Clarence Hopkins

## 2013-12-17 NOTE — Telephone Encounter (Signed)
Notified pt with md response. Made appt for 12/22/13 @ 8:30...Clarence Hopkins

## 2013-12-17 NOTE — Telephone Encounter (Signed)
Last seen 12/13 Can have 30 days but needs OV before refill

## 2013-12-22 ENCOUNTER — Encounter: Payer: Self-pay | Admitting: Internal Medicine

## 2013-12-22 ENCOUNTER — Ambulatory Visit (INDEPENDENT_AMBULATORY_CARE_PROVIDER_SITE_OTHER): Payer: Medicare Other | Admitting: Internal Medicine

## 2013-12-22 ENCOUNTER — Other Ambulatory Visit (INDEPENDENT_AMBULATORY_CARE_PROVIDER_SITE_OTHER): Payer: Medicare Other

## 2013-12-22 VITALS — BP 108/74 | HR 65 | Temp 97.9°F | Resp 14 | Ht 76.0 in | Wt 198.5 lb

## 2013-12-22 DIAGNOSIS — E781 Pure hyperglyceridemia: Secondary | ICD-10-CM | POA: Insufficient documentation

## 2013-12-22 DIAGNOSIS — Z0000 Encounter for general adult medical examination without abnormal findings: Secondary | ICD-10-CM | POA: Diagnosis not present

## 2013-12-22 DIAGNOSIS — R972 Elevated prostate specific antigen [PSA]: Secondary | ICD-10-CM

## 2013-12-22 DIAGNOSIS — E890 Postprocedural hypothyroidism: Secondary | ICD-10-CM | POA: Diagnosis not present

## 2013-12-22 DIAGNOSIS — R1310 Dysphagia, unspecified: Secondary | ICD-10-CM

## 2013-12-22 LAB — LIPID PANEL
Cholesterol: 151 mg/dL (ref 0–200)
HDL: 36.4 mg/dL — ABNORMAL LOW (ref >39.00)
LDL Cholesterol: 106 mg/dL — ABNORMAL HIGH (ref 0–99)
NONHDL: 114.6
TRIGLYCERIDES: 43 mg/dL (ref 0.0–149.0)
Total CHOL/HDL Ratio: 4
VLDL: 8.6 mg/dL (ref 0.0–40.0)

## 2013-12-22 LAB — BASIC METABOLIC PANEL
BUN: 17 mg/dL (ref 6–23)
CALCIUM: 8.9 mg/dL (ref 8.4–10.5)
CO2: 30 mEq/L (ref 19–32)
Chloride: 103 mEq/L (ref 96–112)
Creatinine, Ser: 0.9 mg/dL (ref 0.4–1.5)
GFR: 86.02 mL/min (ref >60.00)
GLUCOSE: 82 mg/dL (ref 70–99)
Potassium: 4.5 mEq/L (ref 3.5–5.1)
SODIUM: 137 meq/L (ref 135–145)

## 2013-12-22 LAB — CBC WITH DIFFERENTIAL/PLATELET
BASOS ABS: 0 10*3/uL (ref 0.0–0.1)
BASOS PCT: 0.4 % (ref 0.0–3.0)
EOS ABS: 0.1 10*3/uL (ref 0.0–0.7)
Eosinophils Relative: 2.5 % (ref 0.0–5.0)
HEMATOCRIT: 43.3 % (ref 39.0–52.0)
Hemoglobin: 13.9 g/dL (ref 13.0–17.0)
LYMPHS ABS: 1.2 10*3/uL (ref 0.7–4.0)
LYMPHS PCT: 25.9 % (ref 12.0–46.0)
MCHC: 32.1 g/dL (ref 30.0–36.0)
MCV: 86.5 fl (ref 78.0–100.0)
MONO ABS: 0.4 10*3/uL (ref 0.1–1.0)
Monocytes Relative: 8.6 % (ref 3.0–12.0)
NEUTROS ABS: 3 10*3/uL (ref 1.4–7.7)
Neutrophils Relative %: 62.6 % (ref 43.0–77.0)
Platelets: 224 10*3/uL (ref 150.0–400.0)
RBC: 5.01 Mil/uL (ref 4.22–5.81)
RDW: 13.7 % (ref 11.5–15.5)
WBC: 4.7 10*3/uL (ref 4.0–10.5)

## 2013-12-22 LAB — HEPATIC FUNCTION PANEL
ALBUMIN: 3.3 g/dL — AB (ref 3.5–5.2)
ALK PHOS: 73 U/L (ref 39–117)
ALT: 20 U/L (ref 0–53)
AST: 27 U/L (ref 0–37)
Bilirubin, Direct: 0.2 mg/dL (ref 0.0–0.3)
TOTAL PROTEIN: 6.7 g/dL (ref 6.0–8.3)
Total Bilirubin: 1.1 mg/dL (ref 0.2–1.2)

## 2013-12-22 LAB — PSA: PSA: 4.38 ng/mL — ABNORMAL HIGH (ref 0.10–4.00)

## 2013-12-22 LAB — TSH: TSH: 0.8 u[IU]/mL (ref 0.35–4.50)

## 2013-12-22 NOTE — Progress Notes (Signed)
Subjective:    Patient ID: Clarence Hopkins, male    DOB: 03-28-1930, 80 y.o.   MRN: [id number]  HPI  Medicare Wellness Visit: Psychosocial and medical history were reviewed as required by Medicare (history related to abuse, antisocial behavior , firearm risk). Social history: Caffeine: 1 cup/day , Alcohol: 2 oz/ day , Tobacco LTJ:QZES 1995 Exercise:daily as biking X 4 mpd Personal safety/fall risk:no Limitations of activities of daily living:no Seatbelt/ smoke alarm use:yes Healthcare Power of Attorney/Living Will status: UTD Ophthalmologic exam status:not UTD Hearing evaluation status:not UTD Orientation: Oriented X 3 Memory and recall: good Spelling testing: good Depression/anxiety assessment: no Foreign travel history: 2010 [place] Immunization status for influenza/pneumonia/ shingles /tetanus:Prevnar  ? needed Transfusion history:no Preventive health care maintenance status: Colonoscopy as per protocol/standard care:aged out Dental care:every 6 mos Chart reviewed and updated. Active issues reviewed and addressed as documented below.   Review of Systems   Constitutional: No significant change in weight ( 2-3 # loss over 6 mos); significant fatigue; sleep disorder (Advil PM qhs); change in appetite. Eye: no blurred, double ,loss of vision Cardiovascular: no palpitations; racing; irregularity ENT/GI: no constipation; diarrhea;hoarseness.for the last 6-8 months he has noted dysphagia several times a week. He questions whether this is related to previous cervical spine surgery. Derm: no change in nails,hair,skin Neuro: no numbness or tingling; tremor Endo: some cold temperature intolerance due to warfarin      Objective:   Physical Exam   Positive or pertinent findings include: There are  postoperative changes in the left earlobe. 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  Gen.: Healthy and well-nourished in appearance. Alert, appropriate and cooperative throughout exam. Appears younger than stated age  Head: Normocephalic without obvious abnormalities;  patternalopecia  Eyes: No corneal or conjunctival inflammation noted. Pupils equal round reactive to light and accommodation. Extraocular motion intact.Ptosis OD Ears: External  ear exam reveals no significant lesions . Canals clear .TMs normal. Hearing is grossly normal bilaterally. Nose: External nasal exam reveals no deformity or inflammation. Nasal mucosa are pink and moist. No lesions or exudates noted.   Mouth: Oral mucosa and oropharynx reveal no lesions or exudates. Teeth in good repair. Neck: No deformities, masses, or tenderness noted. Lungs: Normal respiratory effort; chest expands symmetrically. Lungs are clear to auscultation without rales, wheezes, or increased work of breathing. Heart: Normal rate and rhythm. Normal S1 and S2. No gallop, click, or rub. no murmur. Abdomen: Bowel sounds normal; abdomen soft and nontender. No masses, organomegaly or hernias noted. Genitalia/DRE : as per Dr Alinda Money, Urology Musculoskeletal/extremities: No deformity or scoliosis noted of  the thoracic or lumbar spine.  No clubbing, cyanosis, edema, or significant extremity  deformity noted. Range of motion normal .Tone & strength normal. Fingernail health good. Able to lie down & sit up w/o help. Negative SLR bilaterally Vascular: Carotid, radial artery, dorsalis pedis and  posterior tibial pulses are full and equal. No bruits present. Neurologic: Alert and oriented x3. Deep tendon reflexes symmetrical .  Gait normal.      Skin: Intact without suspicious lesions or rashes. Lymph: No cervical, axillaryl lymphadenopathy present. Psych: Mood and affect are normal. Normally interactive  Assessment & Plan:  See Current Assessment & Plan in Problem List under specific DiagnosisThe labs will be reviewed and risks and options assessed. Written recommendations will be provided by mail or directly through My Chart.Further evaluation or change in medical therapy will be directed by those results.  Dysphagia; GI referral warranted

## 2013-12-22 NOTE — Assessment & Plan Note (Signed)
TSH 

## 2013-12-22 NOTE — Assessment & Plan Note (Signed)
PSA F/U with Dr Alinda Money annually

## 2013-12-22 NOTE — Assessment & Plan Note (Signed)
CBC & dif

## 2013-12-22 NOTE — Patient Instructions (Addendum)
Your next office appointment will be determined based upon review of your pending labs . Those instructions will be transmitted to you  by mail Followup as needed for your acute issue. Please report any significant change in your symptoms.

## 2013-12-22 NOTE — Progress Notes (Signed)
Pre visit review using our clinic review tool, if applicable. No additional management support is needed unless otherwise documented below in the visit note. 

## 2013-12-22 NOTE — Assessment & Plan Note (Signed)
Lipids, liver, TSH

## 2014-01-05 ENCOUNTER — Telehealth: Payer: Self-pay | Admitting: Internal Medicine

## 2014-01-05 DIAGNOSIS — C44229 Squamous cell carcinoma of skin of left ear and external auricular canal: Secondary | ICD-10-CM | POA: Diagnosis not present

## 2014-01-05 DIAGNOSIS — Z85828 Personal history of other malignant neoplasm of skin: Secondary | ICD-10-CM | POA: Diagnosis not present

## 2014-01-05 NOTE — Telephone Encounter (Signed)
Patient would like to know if he can get a written rx for Armour thyroid 120 mg #500. He states he does not use his insurance and would like this mailed to his home address so he can take it to different pharmacies and find the lowest price. CB# [phone]

## 2014-01-06 ENCOUNTER — Other Ambulatory Visit: Payer: Self-pay | Admitting: Internal Medicine

## 2014-01-06 DIAGNOSIS — E890 Postprocedural hypothyroidism: Secondary | ICD-10-CM

## 2014-01-06 MED ORDER — THYROID 120 MG PO TABS: 120.0000 mg | ORAL_TABLET | Freq: Every day | ORAL | Status: DC

## 2014-01-06 NOTE — Telephone Encounter (Signed)
done

## 2014-01-06 NOTE — Telephone Encounter (Signed)
Script has been mailed to patient's home address.

## 2014-01-14 ENCOUNTER — Ambulatory Visit (INDEPENDENT_AMBULATORY_CARE_PROVIDER_SITE_OTHER): Payer: Medicare Other

## 2014-01-14 DIAGNOSIS — Z5181 Encounter for therapeutic drug level monitoring: Secondary | ICD-10-CM | POA: Diagnosis not present

## 2014-01-14 DIAGNOSIS — Z86718 Personal history of other venous thrombosis and embolism: Secondary | ICD-10-CM

## 2014-01-14 LAB — POCT INR: INR: 2.2

## 2014-03-03 ENCOUNTER — Ambulatory Visit (INDEPENDENT_AMBULATORY_CARE_PROVIDER_SITE_OTHER): Payer: Medicare Other

## 2014-03-03 DIAGNOSIS — Z86718 Personal history of other venous thrombosis and embolism: Secondary | ICD-10-CM

## 2014-03-03 DIAGNOSIS — Z5181 Encounter for therapeutic drug level monitoring: Secondary | ICD-10-CM

## 2014-03-03 LAB — POCT INR: INR: 2.3

## 2014-03-04 ENCOUNTER — Encounter: Payer: Medicare Other | Admitting: Internal Medicine

## 2014-03-31 ENCOUNTER — Other Ambulatory Visit: Payer: Self-pay | Admitting: Dermatology

## 2014-03-31 DIAGNOSIS — D1801 Hemangioma of skin and subcutaneous tissue: Secondary | ICD-10-CM | POA: Diagnosis not present

## 2014-03-31 DIAGNOSIS — D485 Neoplasm of uncertain behavior of skin: Secondary | ICD-10-CM | POA: Diagnosis not present

## 2014-03-31 DIAGNOSIS — D2271 Melanocytic nevi of right lower limb, including hip: Secondary | ICD-10-CM | POA: Diagnosis not present

## 2014-03-31 DIAGNOSIS — Z85828 Personal history of other malignant neoplasm of skin: Secondary | ICD-10-CM | POA: Diagnosis not present

## 2014-03-31 DIAGNOSIS — L2089 Other atopic dermatitis: Secondary | ICD-10-CM | POA: Diagnosis not present

## 2014-03-31 DIAGNOSIS — L821 Other seborrheic keratosis: Secondary | ICD-10-CM | POA: Diagnosis not present

## 2014-03-31 DIAGNOSIS — L812 Freckles: Secondary | ICD-10-CM | POA: Diagnosis not present

## 2014-04-07 DIAGNOSIS — R3919 Other difficulties with micturition: Secondary | ICD-10-CM | POA: Diagnosis not present

## 2014-04-07 DIAGNOSIS — N401 Enlarged prostate with lower urinary tract symptoms: Secondary | ICD-10-CM | POA: Diagnosis not present

## 2014-04-15 ENCOUNTER — Ambulatory Visit (INDEPENDENT_AMBULATORY_CARE_PROVIDER_SITE_OTHER): Payer: Medicare Other | Admitting: General Practice

## 2014-04-15 DIAGNOSIS — Z86718 Personal history of other venous thrombosis and embolism: Secondary | ICD-10-CM | POA: Diagnosis not present

## 2014-04-15 DIAGNOSIS — Z5181 Encounter for therapeutic drug level monitoring: Secondary | ICD-10-CM | POA: Diagnosis not present

## 2014-04-15 LAB — POCT INR: INR: 1.8

## 2014-04-15 NOTE — Progress Notes (Signed)
Agree with plan 

## 2014-04-15 NOTE — Progress Notes (Signed)
Pre visit review using our clinic review tool, if applicable. No additional management support is needed unless otherwise documented below in the visit note. 

## 2014-04-21 ENCOUNTER — Other Ambulatory Visit: Payer: Self-pay | Admitting: Dermatology

## 2014-04-21 DIAGNOSIS — Z85828 Personal history of other malignant neoplasm of skin: Secondary | ICD-10-CM | POA: Diagnosis not present

## 2014-04-21 DIAGNOSIS — L089 Local infection of the skin and subcutaneous tissue, unspecified: Secondary | ICD-10-CM | POA: Diagnosis not present

## 2014-04-21 DIAGNOSIS — D485 Neoplasm of uncertain behavior of skin: Secondary | ICD-10-CM | POA: Diagnosis not present

## 2014-05-13 ENCOUNTER — Ambulatory Visit (INDEPENDENT_AMBULATORY_CARE_PROVIDER_SITE_OTHER): Payer: Medicare Other | Admitting: General Practice

## 2014-05-13 DIAGNOSIS — Z5181 Encounter for therapeutic drug level monitoring: Secondary | ICD-10-CM

## 2014-05-13 DIAGNOSIS — Z86718 Personal history of other venous thrombosis and embolism: Secondary | ICD-10-CM

## 2014-05-13 LAB — POCT INR: INR: 2.2

## 2014-05-13 NOTE — Progress Notes (Signed)
Pre visit review using our clinic review tool, if applicable. No additional management support is needed unless otherwise documented below in the visit note. 

## 2014-05-13 NOTE — Progress Notes (Signed)
Agree with plan 

## 2014-06-10 ENCOUNTER — Ambulatory Visit: Payer: Medicare Other

## 2014-06-10 ENCOUNTER — Ambulatory Visit (INDEPENDENT_AMBULATORY_CARE_PROVIDER_SITE_OTHER): Payer: Medicare Other | Admitting: General Practice

## 2014-06-10 DIAGNOSIS — Z86718 Personal history of other venous thrombosis and embolism: Secondary | ICD-10-CM | POA: Diagnosis not present

## 2014-06-10 DIAGNOSIS — Z5181 Encounter for therapeutic drug level monitoring: Secondary | ICD-10-CM | POA: Diagnosis not present

## 2014-06-10 LAB — POCT INR: INR: 2.2

## 2014-06-10 NOTE — Progress Notes (Signed)
Pre visit review using our clinic review tool, if applicable. No additional management support is needed unless otherwise documented below in the visit note. 

## 2014-06-10 NOTE — Progress Notes (Signed)
Agree with plan 

## 2014-06-30 DIAGNOSIS — L821 Other seborrheic keratosis: Secondary | ICD-10-CM | POA: Diagnosis not present

## 2014-06-30 DIAGNOSIS — Z85828 Personal history of other malignant neoplasm of skin: Secondary | ICD-10-CM | POA: Diagnosis not present

## 2014-06-30 DIAGNOSIS — L570 Actinic keratosis: Secondary | ICD-10-CM | POA: Diagnosis not present

## 2014-06-30 DIAGNOSIS — D2272 Melanocytic nevi of left lower limb, including hip: Secondary | ICD-10-CM | POA: Diagnosis not present

## 2014-06-30 DIAGNOSIS — L812 Freckles: Secondary | ICD-10-CM | POA: Diagnosis not present

## 2014-06-30 DIAGNOSIS — D2271 Melanocytic nevi of right lower limb, including hip: Secondary | ICD-10-CM | POA: Diagnosis not present

## 2014-06-30 DIAGNOSIS — D225 Melanocytic nevi of trunk: Secondary | ICD-10-CM | POA: Diagnosis not present

## 2014-06-30 DIAGNOSIS — D1801 Hemangioma of skin and subcutaneous tissue: Secondary | ICD-10-CM | POA: Diagnosis not present

## 2014-07-08 ENCOUNTER — Ambulatory Visit (INDEPENDENT_AMBULATORY_CARE_PROVIDER_SITE_OTHER): Payer: Medicare Other | Admitting: General Practice

## 2014-07-08 DIAGNOSIS — Z86718 Personal history of other venous thrombosis and embolism: Secondary | ICD-10-CM

## 2014-07-08 DIAGNOSIS — Z5181 Encounter for therapeutic drug level monitoring: Secondary | ICD-10-CM

## 2014-07-08 LAB — POCT INR: INR: 2.2

## 2014-07-08 NOTE — Progress Notes (Signed)
I have reviewed and agree with the plan. 

## 2014-07-08 NOTE — Progress Notes (Signed)
Pre visit review using our clinic review tool, if applicable. No additional management support is needed unless otherwise documented below in the visit note. 

## 2014-07-26 ENCOUNTER — Other Ambulatory Visit: Payer: Self-pay | Admitting: Internal Medicine

## 2014-07-27 ENCOUNTER — Other Ambulatory Visit: Payer: Self-pay

## 2014-07-27 MED ORDER — WARFARIN SODIUM 5 MG PO TABS: ORAL_TABLET | ORAL | Status: DC

## 2014-07-27 NOTE — Telephone Encounter (Signed)
OK X 3 mos 

## 2014-07-27 NOTE — Telephone Encounter (Signed)
Please advise, thanks.

## 2014-07-27 NOTE — Telephone Encounter (Signed)
Coumadin rx sent to pharm

## 2014-08-05 ENCOUNTER — Ambulatory Visit (INDEPENDENT_AMBULATORY_CARE_PROVIDER_SITE_OTHER): Payer: Medicare Other | Admitting: General Practice

## 2014-08-05 DIAGNOSIS — Z86718 Personal history of other venous thrombosis and embolism: Secondary | ICD-10-CM | POA: Diagnosis not present

## 2014-08-05 DIAGNOSIS — Z5181 Encounter for therapeutic drug level monitoring: Secondary | ICD-10-CM | POA: Diagnosis not present

## 2014-08-05 LAB — POCT INR: INR: 2.3

## 2014-08-05 NOTE — Progress Notes (Signed)
Pre visit review using our clinic review tool, if applicable. No additional management support is needed unless otherwise documented below in the visit note. 

## 2014-08-06 NOTE — Progress Notes (Signed)
I have reviewed and agree with the plan. 

## 2014-09-11 DIAGNOSIS — H524 Presbyopia: Secondary | ICD-10-CM | POA: Diagnosis not present

## 2014-09-11 DIAGNOSIS — H25813 Combined forms of age-related cataract, bilateral: Secondary | ICD-10-CM | POA: Diagnosis not present

## 2014-09-11 DIAGNOSIS — H04123 Dry eye syndrome of bilateral lacrimal glands: Secondary | ICD-10-CM | POA: Diagnosis not present

## 2014-09-11 DIAGNOSIS — H01001 Unspecified blepharitis right upper eyelid: Secondary | ICD-10-CM | POA: Diagnosis not present

## 2014-09-16 ENCOUNTER — Ambulatory Visit: Payer: Medicare Other

## 2014-09-23 ENCOUNTER — Ambulatory Visit (INDEPENDENT_AMBULATORY_CARE_PROVIDER_SITE_OTHER): Payer: Medicare Other | Admitting: General Practice

## 2014-09-23 DIAGNOSIS — Z5181 Encounter for therapeutic drug level monitoring: Secondary | ICD-10-CM | POA: Diagnosis not present

## 2014-09-23 DIAGNOSIS — Z86718 Personal history of other venous thrombosis and embolism: Secondary | ICD-10-CM

## 2014-09-23 LAB — POCT INR: INR: 2.1

## 2014-09-23 NOTE — Progress Notes (Signed)
Pre visit review using our clinic review tool, if applicable. No additional management support is needed unless otherwise documented below in the visit note. 

## 2014-09-23 NOTE — Progress Notes (Signed)
I have reviewed and agree with the plan. 

## 2014-10-09 DIAGNOSIS — L812 Freckles: Secondary | ICD-10-CM | POA: Diagnosis not present

## 2014-10-09 DIAGNOSIS — L570 Actinic keratosis: Secondary | ICD-10-CM | POA: Diagnosis not present

## 2014-10-09 DIAGNOSIS — L821 Other seborrheic keratosis: Secondary | ICD-10-CM | POA: Diagnosis not present

## 2014-10-09 DIAGNOSIS — Z85828 Personal history of other malignant neoplasm of skin: Secondary | ICD-10-CM | POA: Diagnosis not present

## 2014-10-09 DIAGNOSIS — D1801 Hemangioma of skin and subcutaneous tissue: Secondary | ICD-10-CM | POA: Diagnosis not present

## 2014-10-13 DIAGNOSIS — H25041 Posterior subcapsular polar age-related cataract, right eye: Secondary | ICD-10-CM | POA: Diagnosis not present

## 2014-10-13 DIAGNOSIS — H2511 Age-related nuclear cataract, right eye: Secondary | ICD-10-CM | POA: Diagnosis not present

## 2014-10-13 DIAGNOSIS — H5703 Miosis: Secondary | ICD-10-CM | POA: Diagnosis not present

## 2014-10-13 DIAGNOSIS — H25811 Combined forms of age-related cataract, right eye: Secondary | ICD-10-CM | POA: Diagnosis not present

## 2014-10-13 DIAGNOSIS — H25011 Cortical age-related cataract, right eye: Secondary | ICD-10-CM | POA: Diagnosis not present

## 2014-10-13 DIAGNOSIS — H21561 Pupillary abnormality, right eye: Secondary | ICD-10-CM | POA: Diagnosis not present

## 2014-10-27 DIAGNOSIS — H2512 Age-related nuclear cataract, left eye: Secondary | ICD-10-CM | POA: Diagnosis not present

## 2014-10-27 DIAGNOSIS — H25012 Cortical age-related cataract, left eye: Secondary | ICD-10-CM | POA: Diagnosis not present

## 2014-10-27 DIAGNOSIS — H25812 Combined forms of age-related cataract, left eye: Secondary | ICD-10-CM | POA: Diagnosis not present

## 2014-10-27 DIAGNOSIS — H21562 Pupillary abnormality, left eye: Secondary | ICD-10-CM | POA: Diagnosis not present

## 2014-10-27 DIAGNOSIS — H25042 Posterior subcapsular polar age-related cataract, left eye: Secondary | ICD-10-CM | POA: Diagnosis not present

## 2014-11-04 ENCOUNTER — Ambulatory Visit (INDEPENDENT_AMBULATORY_CARE_PROVIDER_SITE_OTHER): Payer: Medicare Other | Admitting: General Practice

## 2014-11-04 DIAGNOSIS — Z5181 Encounter for therapeutic drug level monitoring: Secondary | ICD-10-CM

## 2014-11-04 DIAGNOSIS — Z86718 Personal history of other venous thrombosis and embolism: Secondary | ICD-10-CM

## 2014-11-04 LAB — POCT INR: INR: 2.1

## 2014-11-04 NOTE — Progress Notes (Signed)
I have reviewed and agree with the plan. 

## 2014-11-04 NOTE — Progress Notes (Signed)
Pre visit review using our clinic review tool, if applicable. No additional management support is needed unless otherwise documented below in the visit note. 

## 2014-11-24 DIAGNOSIS — Z85828 Personal history of other malignant neoplasm of skin: Secondary | ICD-10-CM | POA: Diagnosis not present

## 2014-11-24 DIAGNOSIS — L720 Epidermal cyst: Secondary | ICD-10-CM | POA: Diagnosis not present

## 2014-12-07 DIAGNOSIS — Z23 Encounter for immunization: Secondary | ICD-10-CM | POA: Diagnosis not present

## 2014-12-16 ENCOUNTER — Ambulatory Visit: Payer: Medicare Other

## 2014-12-22 ENCOUNTER — Ambulatory Visit (INDEPENDENT_AMBULATORY_CARE_PROVIDER_SITE_OTHER): Payer: Medicare Other | Admitting: General Practice

## 2014-12-22 DIAGNOSIS — Z86718 Personal history of other venous thrombosis and embolism: Secondary | ICD-10-CM

## 2014-12-22 DIAGNOSIS — Z5181 Encounter for therapeutic drug level monitoring: Secondary | ICD-10-CM

## 2014-12-22 LAB — POCT INR: INR: 1.9

## 2014-12-22 NOTE — Progress Notes (Signed)
Pre visit review using our clinic review tool, if applicable. No additional management support is needed unless otherwise documented below in the visit note. 

## 2014-12-22 NOTE — Progress Notes (Signed)
I have reviewed and agree with the plan. 

## 2015-01-11 DIAGNOSIS — L570 Actinic keratosis: Secondary | ICD-10-CM | POA: Diagnosis not present

## 2015-01-11 DIAGNOSIS — L308 Other specified dermatitis: Secondary | ICD-10-CM | POA: Diagnosis not present

## 2015-01-11 DIAGNOSIS — D485 Neoplasm of uncertain behavior of skin: Secondary | ICD-10-CM | POA: Diagnosis not present

## 2015-01-11 DIAGNOSIS — D1801 Hemangioma of skin and subcutaneous tissue: Secondary | ICD-10-CM | POA: Diagnosis not present

## 2015-01-11 DIAGNOSIS — L812 Freckles: Secondary | ICD-10-CM | POA: Diagnosis not present

## 2015-01-11 DIAGNOSIS — Z85828 Personal history of other malignant neoplasm of skin: Secondary | ICD-10-CM | POA: Diagnosis not present

## 2015-01-11 DIAGNOSIS — C44712 Basal cell carcinoma of skin of right lower limb, including hip: Secondary | ICD-10-CM | POA: Diagnosis not present

## 2015-01-11 DIAGNOSIS — L720 Epidermal cyst: Secondary | ICD-10-CM | POA: Diagnosis not present

## 2015-01-11 DIAGNOSIS — L821 Other seborrheic keratosis: Secondary | ICD-10-CM | POA: Diagnosis not present

## 2015-01-28 ENCOUNTER — Other Ambulatory Visit: Payer: Self-pay | Admitting: Emergency Medicine

## 2015-01-28 DIAGNOSIS — E890 Postprocedural hypothyroidism: Secondary | ICD-10-CM

## 2015-01-28 MED ORDER — THYROID 120 MG PO TABS: 120.0000 mg | ORAL_TABLET | Freq: Every day | ORAL | Status: DC

## 2015-01-28 NOTE — Telephone Encounter (Signed)
Spoke with pt. Pt has appt scheduled with Terri Piedra, FNP. Rx has been sent in to [facility] for Armour Thyroid

## 2015-02-02 ENCOUNTER — Ambulatory Visit (INDEPENDENT_AMBULATORY_CARE_PROVIDER_SITE_OTHER): Payer: Medicare Other | Admitting: General Practice

## 2015-02-02 ENCOUNTER — Other Ambulatory Visit (INDEPENDENT_AMBULATORY_CARE_PROVIDER_SITE_OTHER): Payer: Medicare Other

## 2015-02-02 ENCOUNTER — Ambulatory Visit (INDEPENDENT_AMBULATORY_CARE_PROVIDER_SITE_OTHER): Payer: Medicare Other | Admitting: Family

## 2015-02-02 ENCOUNTER — Telehealth: Payer: Self-pay | Admitting: Family

## 2015-02-02 ENCOUNTER — Encounter: Payer: Self-pay | Admitting: Family

## 2015-02-02 VITALS — BP 130/82 | HR 57 | Resp 16 | Ht 76.0 in | Wt 200.0 lb

## 2015-02-02 DIAGNOSIS — Z0000 Encounter for general adult medical examination without abnormal findings: Secondary | ICD-10-CM | POA: Diagnosis not present

## 2015-02-02 DIAGNOSIS — E890 Postprocedural hypothyroidism: Secondary | ICD-10-CM

## 2015-02-02 DIAGNOSIS — E781 Pure hyperglyceridemia: Secondary | ICD-10-CM

## 2015-02-02 DIAGNOSIS — Z86718 Personal history of other venous thrombosis and embolism: Secondary | ICD-10-CM

## 2015-02-02 DIAGNOSIS — Z5181 Encounter for therapeutic drug level monitoring: Secondary | ICD-10-CM

## 2015-02-02 LAB — CBC
HCT: 42.1 % (ref 39.0–52.0)
Hemoglobin: 13.5 g/dL (ref 13.0–17.0)
MCHC: 32.1 g/dL (ref 30.0–36.0)
MCV: 86.8 fl (ref 78.0–100.0)
Platelets: 222 10*3/uL (ref 150.0–400.0)
RBC: 4.85 Mil/uL (ref 4.22–5.81)
RDW: 13.7 % (ref 11.5–15.5)
WBC: 5.3 10*3/uL (ref 4.0–10.5)

## 2015-02-02 LAB — COMPREHENSIVE METABOLIC PANEL
ALT: 18 U/L (ref 0–53)
AST: 22 U/L (ref 0–37)
Albumin: 3.8 g/dL (ref 3.5–5.2)
Alkaline Phosphatase: 82 U/L (ref 39–117)
BILIRUBIN TOTAL: 0.8 mg/dL (ref 0.2–1.2)
BUN: 15 mg/dL (ref 6–23)
CALCIUM: 9.3 mg/dL (ref 8.4–10.5)
CHLORIDE: 103 meq/L (ref 96–112)
CO2: 29 meq/L (ref 19–32)
Creatinine, Ser: 0.92 mg/dL (ref 0.40–1.50)
GFR: 83.64 mL/min (ref >60.00)
GLUCOSE: 93 mg/dL (ref 70–99)
Potassium: 4.4 mEq/L (ref 3.5–5.1)
Sodium: 139 mEq/L (ref 135–145)
TOTAL PROTEIN: 6.9 g/dL (ref 6.0–8.3)

## 2015-02-02 LAB — LIPID PANEL
CHOLESTEROL: 134 mg/dL (ref 0–200)
HDL: 35.7 mg/dL — AB (ref >39.00)
LDL Cholesterol: 85 mg/dL (ref 0–99)
NonHDL: 98.65
Total CHOL/HDL Ratio: 4
Triglycerides: 69 mg/dL (ref 0.0–149.0)
VLDL: 13.8 mg/dL (ref 0.0–40.0)

## 2015-02-02 LAB — TSH: TSH: 1.52 u[IU]/mL (ref 0.35–4.50)

## 2015-02-02 LAB — POCT INR: INR: 2

## 2015-02-02 NOTE — Assessment & Plan Note (Signed)
Reviewed and updated patient's medical, surgical, family and social history. Medications and allergies were also reviewed. Basic screenings for depression, activities of daily living, hearing, cognition and safety were performed. Provider list was updated and health plan was provided to the patient.  

## 2015-02-02 NOTE — Telephone Encounter (Signed)
Please inform patient that his blood work shows that his thyroid function, white/red blood cells, kidney function, liver function and electrolytes are all within the normal limits. His good cholesterol is slightly low which can be improved with minor nutrition changes adding sources of omega-3 fatty acids to his diet including olive oil, flaxseed, fish, fish oil and walnuts. He can plan to follow up in 1 year or sooner if needed.

## 2015-02-02 NOTE — Progress Notes (Signed)
I have reviewed and agree with the plan. 

## 2015-02-02 NOTE — Patient Instructions (Signed)
Thank you for choosing [facility].  Summary/Instructions:  Please stop by the lab on the basement level of the building for your blood work. Your results will be released to [place] (or called to you) after review, usually within 72 hours after test completion. If any changes need to be made, you will be notified at that same time.  If your symptoms worsen or fail to improve, please contact our office for further instruction, or in case of emergency go directly to the emergency room at the closest medical facility.   Health Maintenance, Male A healthy lifestyle and preventative care can promote health and wellness.  Maintain regular health, dental, and eye exams.  Eat a healthy diet. Foods like vegetables, fruits, whole grains, low-fat dairy products, and lean protein foods contain the nutrients you need and are low in calories. Decrease your intake of foods high in solid fats, added sugars, and salt. Get information about a proper diet from your health care provider, if necessary.  Regular physical exercise is one of the most important things you can do for your health. Most adults should get at least 150 minutes of moderate-intensity exercise (any activity that increases your heart rate and causes you to sweat) each week. In addition, most adults need muscle-strengthening exercises on 2 or more days a week.   Maintain a healthy weight. The body mass index (BMI) is a screening tool to identify possible weight problems. It provides an estimate of body fat based on height and weight. Your health care provider can find your BMI and can help you achieve or maintain a healthy weight. For males 20 years and older:  A BMI below 18.5 is considered underweight.  A BMI of 18.5 to 24.9 is normal.  A BMI of 25 to 29.9 is considered overweight.  A BMI of 30 and above is considered obese.  Maintain normal blood lipids and cholesterol by exercising and minimizing your intake of saturated fat.  Eat a balanced diet with plenty of fruits and vegetables. Blood tests for lipids and cholesterol should begin at age 72 and be repeated every 5 years. If your lipid or cholesterol levels are high, you are over age 71, or you are at high risk for heart disease, you may need your cholesterol levels checked more frequently.Ongoing high lipid and cholesterol levels should be treated with medicines if diet and exercise are not working.  If you smoke, find out from your health care provider how to quit. If you do not use tobacco, do not start.  Lung cancer screening is recommended for adults aged 55-80 years who are at high risk for developing lung cancer because of a history of smoking. A yearly low-dose CT scan of the lungs is recommended for people who have at least a 30-pack-year history of smoking and are current smokers or have quit within the past 15 years. A pack year of smoking is smoking an average of 1 pack of cigarettes a day for 1 year (for example, a 30-pack-year history of smoking could mean smoking 1 pack a day for 30 years or 2 packs a day for 15 years). Yearly screening should continue until the smoker has stopped smoking for at least 15 years. Yearly screening should be stopped for people who develop a health problem that would prevent them from having lung cancer treatment.  If you choose to drink alcohol, do not have more than 2 drinks per day. One drink is considered to be 12 oz (360 mL) of  beer, 5 oz (150 mL) of wine, or 1.5 oz (45 mL) of liquor.  Avoid the use of street drugs. Do not share needles with anyone. Ask for help if you need support or instructions about stopping the use of drugs.  High blood pressure causes heart disease and increases the risk of stroke. High blood pressure is more likely to develop in:  People who have blood pressure in the end of the normal range (100-139/85-89 mm Hg).  People who are overweight or obese.  People who are African American.  If you are  59-28 years of age, have your blood pressure checked every 3-5 years. If you are 58 years of age or older, have your blood pressure checked every year. You should have your blood pressure measured twice--once when you are at a hospital or clinic, and once when you are not at a hospital or clinic. Record the average of the two measurements. To check your blood pressure when you are not at a hospital or clinic, you can use:  An automated blood pressure machine at a pharmacy.  A home blood pressure monitor.  If you are 47-62 years old, ask your health care provider if you should take aspirin to prevent heart disease.  Diabetes screening involves taking a blood sample to check your fasting blood sugar level. This should be done once every 3 years after age 24 if you are at a normal weight and without risk factors for diabetes. Testing should be considered at a younger age or be carried out more frequently if you are overweight and have at least 1 risk factor for diabetes.  Colorectal cancer can be detected and often prevented. Most routine colorectal cancer screening begins at the age of 57 and continues through age 48. However, your health care provider may recommend screening at an earlier age if you have risk factors for colon cancer. On a yearly basis, your health care provider may provide home test kits to check for hidden blood in the stool. A small camera at the end of a tube may be used to directly examine the colon (sigmoidoscopy or colonoscopy) to detect the earliest forms of colorectal cancer. Talk to your health care provider about this at age 70 when routine screening begins. A direct exam of the colon should be repeated every 5-10 years through age 70, unless early forms of precancerous polyps or small growths are found.  People who are at an increased risk for hepatitis B should be screened for this virus. You are considered at high risk for hepatitis B if:  You were born in a country where  hepatitis B occurs often. Talk with your health care provider about which countries are considered high risk.  Your parents were born in a high-risk country and you have not received a shot to protect against hepatitis B (hepatitis B vaccine).  You have HIV or AIDS.  You use needles to inject street drugs.  You live with, or have sex with, someone who has hepatitis B.  You are a man who has sex with other men (MSM).  You get hemodialysis treatment.  You take certain medicines for conditions like cancer, organ transplantation, and autoimmune conditions.  Hepatitis C blood testing is recommended for all people born from [birth year] through 1963 and any individual with known risk factors for hepatitis C.  Healthy men should no longer receive prostate-specific antigen (PSA) blood tests as part of routine cancer screening. Talk to your health care provider about prostate  cancer screening.  Testicular cancer screening is not recommended for adolescents or adult males who have no symptoms. Screening includes self-exam, a health care provider exam, and other screening tests. Consult with your health care provider about any symptoms you have or any concerns you have about testicular cancer.  Practice safe sex. Use condoms and avoid high-risk sexual practices to reduce the spread of sexually transmitted infections (STIs).  You should be screened for STIs, including gonorrhea and chlamydia if:  You are sexually active and are younger than 24 years.  You are older than 24 years, and your health care provider tells you that you are at risk for this type of infection.  Your sexual activity has changed since you were last screened, and you are at an increased risk for chlamydia or gonorrhea. Ask your health care provider if you are at risk.  If you are at risk of being infected with HIV, it is recommended that you take a prescription medicine daily to prevent HIV infection. This is called pre-exposure  prophylaxis (PrEP). You are considered at risk if:  You are a man who has sex with other men (MSM).  You are a heterosexual man who is sexually active with multiple partners.  You take drugs by injection.  You are sexually active with a partner who has HIV.  Talk with your health care provider about whether you are at high risk of being infected with HIV. If you choose to begin PrEP, you should first be tested for HIV. You should then be tested every 3 months for as long as you are taking PrEP.  Use sunscreen. Apply sunscreen liberally and repeatedly throughout the day. You should seek shade when your shadow is shorter than you. Protect yourself by wearing long sleeves, pants, a wide-brimmed hat, and sunglasses year round whenever you are outdoors.  Tell your health care provider of new moles or changes in moles, especially if there is a change in shape or color. Also, tell your health care provider if a mole is larger than the size of a pencil eraser.  A one-time screening for abdominal aortic aneurysm (AAA) and surgical repair of large AAAs by ultrasound is recommended for men aged 60-75 years who are current or former smokers.  Stay current with your vaccines (immunizations).   This information is not intended to replace advice given to you by your health care provider. Make sure you discuss any questions you have with your health care provider.   Document Released: 07/29/2007 Document Revised: 02/20/2014 Document Reviewed: 06/27/2010 Elsevier Interactive Patient Education 2016 [place] Maintenance  Topic Date Due  . Samul Dada  09/22/1949  . PNA vac Low Risk Adult (1 of 2 - PCV13) 09/21/1997  . INFLUENZA VACCINE  09/14/2015  . ZOSTAVAX  Completed

## 2015-02-02 NOTE — Progress Notes (Signed)
Pre visit review using our clinic review tool, if applicable. No additional management support is needed unless otherwise documented below in the visit note. 

## 2015-02-02 NOTE — Progress Notes (Signed)
Subjective:    Patient ID: Clarence Hopkins, male    DOB: April 05, 1930, 81 y.o.   MRN: [id number]  Chief Complaint  Patient presents with  . Establish Care    CPE, fasting    HPI:  Clarence Hopkins is a 81 y.o. male who presents today for an annual wellness visit.   1) Health Maintenance -   Diet - Averages about 3 meals per day consisting of fruits, vegetables, fish, chicken, and occasional beef. 1-2 cups of caffeine daily  Exercise - Daily; 5am goes to the club and cycles about 4 miles.   2) Preventative Exams / Immunizations:  Dental -- Up to date  Vision -- Up to date   Health Maintenance  Topic Date Due  . TETANUS/TDAP  09/22/1951  . PNA vac Low Risk Adult (1 of 2 - PCV13) 09/21/1997  . INFLUENZA VACCINE  09/14/2015  . ZOSTAVAX  Completed    Immunization History  Administered Date(s) Administered  . Influenza Split 11/13/2012  . Influenza Whole 11/13/2009, 11/14/2011  . Influenza-Unspecified 12/08/2013, 12/07/2014  . Zoster 03/14/2006     RISK FACTORS  Tobacco History  Smoking status  . Former Smoker -- 0.75 packs/day for 41 years  . Types: Cigarettes  . Quit date: 02/13/1993  Smokeless tobacco  . Never Used    Comment: smoked 1956-1995, up to 1/2 ppd     Cardiac risk factors: advanced age (older than 47 for men, 22 for women), dyslipidemia and male gender.  Depression Screen  Depression screen [facility] 2/9 02/02/2015  Decreased Interest 0  Down, Depressed, Hopeless 0  PHQ - 2 Score 0     Activities of Daily Living In your present state of health, do you have any difficulty performing the following activities?:  Driving? No Managing money?  No Feeding yourself? No Getting from bed to chair? No Climbing a flight of stairs? No Preparing food and eating?: No Bathing or showering? No Getting dressed: No Getting to the toilet? No Using the toilet: No Moving around from place to place: No In the past year have you fallen or had a near  fall?:No   Home Safety Has smoke detector and wears seat belts.  No excess sun exposure.  Are there smokers in your home (other than you)?  No Do you feel safe at home?  Yes  Hearing Difficulties: No Do you often ask people to speak up or repeat themselves? No Do you experience ringing or noises in your ears? No  Do you have difficulty understanding soft or whispered voices? No    Cognitive Testing  Alert? Yes   Normal Appearance? Yes  Oriented to person? Yes  Place? Yes   Time? Yes  Recall of three objects?  Yes  Can perform simple calculations? Yes  Displays appropriate judgment? Yes  Can read the correct time from a watch face? Yes  Do you feel that you have a problem with memory? No  Do you often misplace items? No   Advanced Directives have been discussed with the patient? All completed and updated.   Current Physicians/Providers and Suppliers  1. Terri Piedra, FNP - Internal Medicine 2. Meriam Sprague, RN - Coumadin Management 3. Raynelle Bring, MD - Urology  Indicate any recent Medical Services you may have received from other than [facility] providers in the past year (date may be approximate).  All answers were reviewed with the patient and necessary referrals were made:  Mauricio Po, [place]   02/02/2015  Review of Systems  Constitutional: Denies fever, chills, fatigue, or significant weight gain/loss. HENT: Head: Denies headache or neck pain Ears: Denies changes in hearing, ringing in ears, earache, drainage Nose: Denies discharge, stuffiness, itching, nosebleed, sinus pain Throat: Denies sore throat, hoarseness, dry mouth, sores, thrush Eyes: Denies loss/changes in vision, pain, redness, blurry/double vision, flashing lights Cardiovascular: Denies chest pain/discomfort, tightness, palpitations, shortness of breath with activity, difficulty lying down, swelling, sudden awakening with shortness of breath Respiratory: Denies shortness of breath, cough, sputum  production, wheezing Gastrointestinal: Denies dysphasia, heartburn, change in appetite, nausea, change in bowel habits, rectal bleeding, constipation, diarrhea, yellow skin or eyes Genitourinary: Denies frequency, urgency, burning/pain, blood in urine, incontinence, change in urinary strength. Musculoskeletal: Denies muscle/joint pain, stiffness, back pain, redness or swelling of joints, trauma Skin: Denies rashes, lumps, itching, dryness, color changes, or hair/nail changes Neurological: Denies dizziness, fainting, seizures, weakness, numbness, tingling, tremor Psychiatric - Denies nervousness, stress, depression or memory loss Endocrine: Denies heat or cold intolerance, sweating, frequent urination, excessive thirst, changes in appetite Hematologic: Denies ease of bruising or bleeding    Objective:     BP 130/82 mmHg  Pulse 57  Resp 16  Ht '6\' 4"'$  (1.93 m)  Wt 200 lb (90.719 kg)  BMI 24.35 kg/m2  SpO2 98% Nursing note and vital signs reviewed.  Physical Exam  Constitutional: He is oriented to person, place, and time. He appears well-developed and well-nourished. No distress.  Cardiovascular: Normal rate, regular rhythm, normal heart sounds and intact distal pulses.   Pulmonary/Chest: Effort normal and breath sounds normal.  Neurological: He is alert and oriented to person, place, and time.  Skin: Skin is warm and dry.  Psychiatric: He has a normal mood and affect. His behavior is normal. Judgment and thought content normal.       Assessment & Plan:   During the course of the visit the patient was educated and counseled about appropriate screening and preventive services including:    Pneumococcal vaccine   Influenza vaccine  Prostate cancer screening  Nutrition counseling   Advanced directives: has an advanced directive - a copy HAS NOT been provided.  Diet review for nutrition referral? Yes ____  Not Indicated _X___   Patient Instructions (the written plan) was  given to the patient.  Medicare Attestation I have personally reviewed: The patient's medical and social history Their use of alcohol, tobacco or illicit drugs Their current medications and supplements The patient's functional ability including ADLs,fall risks, home safety risks, cognitive, and hearing and visual impairment Diet and physical activities Evidence for depression or mood disorders  The patient's weight, height, BMI,  have been recorded in the chart.  I have made referrals, counseling, and provided education to the patient based on review of the above and I have provided the patient with a written personalized care plan for preventive services.     Mauricio Po, [place]   02/02/2015    Problem List Items Addressed This Visit      Endocrine   Post-surgical hypothyroidism   Relevant Orders   TSH     Other   Hypertriglyceridemia   Relevant Orders   CBC   Lipid Profile   Comprehensive metabolic panel   Medicare annual wellness visit, subsequent - Primary    Reviewed and updated patient's medical, surgical, family and social history. Medications and allergies were also reviewed. Basic screenings for depression, activities of daily living, hearing, cognition and safety were performed. Provider list was updated and health plan was  provided to the patient.

## 2015-02-03 NOTE — Telephone Encounter (Signed)
Pt aware of results 

## 2015-03-17 ENCOUNTER — Ambulatory Visit (INDEPENDENT_AMBULATORY_CARE_PROVIDER_SITE_OTHER): Payer: Medicare Other | Admitting: General Practice

## 2015-03-17 DIAGNOSIS — Z86718 Personal history of other venous thrombosis and embolism: Secondary | ICD-10-CM

## 2015-03-17 DIAGNOSIS — Z5181 Encounter for therapeutic drug level monitoring: Secondary | ICD-10-CM

## 2015-03-17 LAB — POCT INR: INR: 2.5

## 2015-03-17 NOTE — Progress Notes (Signed)
I have reviewed and agree with the plan. 

## 2015-03-17 NOTE — Progress Notes (Signed)
Pre visit review using our clinic review tool, if applicable. No additional management support is needed unless otherwise documented below in the visit note. 

## 2015-03-23 DIAGNOSIS — D2272 Melanocytic nevi of left lower limb, including hip: Secondary | ICD-10-CM | POA: Diagnosis not present

## 2015-03-23 DIAGNOSIS — D485 Neoplasm of uncertain behavior of skin: Secondary | ICD-10-CM | POA: Diagnosis not present

## 2015-03-23 DIAGNOSIS — D1801 Hemangioma of skin and subcutaneous tissue: Secondary | ICD-10-CM | POA: Diagnosis not present

## 2015-03-23 DIAGNOSIS — D2271 Melanocytic nevi of right lower limb, including hip: Secondary | ICD-10-CM | POA: Diagnosis not present

## 2015-03-23 DIAGNOSIS — C44712 Basal cell carcinoma of skin of right lower limb, including hip: Secondary | ICD-10-CM | POA: Diagnosis not present

## 2015-03-23 DIAGNOSIS — L821 Other seborrheic keratosis: Secondary | ICD-10-CM | POA: Diagnosis not present

## 2015-03-23 DIAGNOSIS — L570 Actinic keratosis: Secondary | ICD-10-CM | POA: Diagnosis not present

## 2015-03-23 DIAGNOSIS — C4359 Malignant melanoma of other part of trunk: Secondary | ICD-10-CM | POA: Diagnosis not present

## 2015-03-23 DIAGNOSIS — Z85828 Personal history of other malignant neoplasm of skin: Secondary | ICD-10-CM | POA: Diagnosis not present

## 2015-03-23 DIAGNOSIS — L812 Freckles: Secondary | ICD-10-CM | POA: Diagnosis not present

## 2015-04-08 DIAGNOSIS — C439 Malignant melanoma of skin, unspecified: Secondary | ICD-10-CM | POA: Diagnosis not present

## 2015-04-11 DIAGNOSIS — C44712 Basal cell carcinoma of skin of right lower limb, including hip: Secondary | ICD-10-CM | POA: Diagnosis not present

## 2015-04-11 DIAGNOSIS — C4359 Malignant melanoma of other part of trunk: Secondary | ICD-10-CM | POA: Diagnosis not present

## 2015-04-14 DIAGNOSIS — Z79899 Other long term (current) drug therapy: Secondary | ICD-10-CM | POA: Diagnosis not present

## 2015-04-14 DIAGNOSIS — C439 Malignant melanoma of skin, unspecified: Secondary | ICD-10-CM | POA: Diagnosis not present

## 2015-04-15 DIAGNOSIS — C439 Malignant melanoma of skin, unspecified: Secondary | ICD-10-CM | POA: Diagnosis not present

## 2015-04-16 ENCOUNTER — Telehealth: Payer: Self-pay | Admitting: General Practice

## 2015-04-16 NOTE — Telephone Encounter (Signed)
-----   Message from Golden Circle, [place] sent at 04/16/2015 12:55 PM EST ----- Regarding: RE: Clearance to stop coumadin for 5 days Yes, okay to stop coumadin with known increased risk.   ----- Message -----    From: Warden Fillers, RN    Sent: 04/15/2015   4:34 PM      To: Golden Circle, FNP Subject: Clearance to stop coumadin for 5 days          Patient needs a lung biopsy.  OK to stop coumadin for 5 days?  Jenny Reichmann

## 2015-04-22 DIAGNOSIS — C771 Secondary and unspecified malignant neoplasm of intrathoracic lymph nodes: Secondary | ICD-10-CM | POA: Diagnosis not present

## 2015-04-22 DIAGNOSIS — R591 Generalized enlarged lymph nodes: Secondary | ICD-10-CM | POA: Diagnosis not present

## 2015-04-22 DIAGNOSIS — Z872 Personal history of diseases of the skin and subcutaneous tissue: Secondary | ICD-10-CM | POA: Diagnosis not present

## 2015-04-22 DIAGNOSIS — R918 Other nonspecific abnormal finding of lung field: Secondary | ICD-10-CM | POA: Diagnosis not present

## 2015-04-22 DIAGNOSIS — J984 Other disorders of lung: Secondary | ICD-10-CM | POA: Diagnosis not present

## 2015-04-22 DIAGNOSIS — Z7901 Long term (current) use of anticoagulants: Secondary | ICD-10-CM | POA: Diagnosis not present

## 2015-04-22 DIAGNOSIS — Z87891 Personal history of nicotine dependence: Secondary | ICD-10-CM | POA: Diagnosis not present

## 2015-04-22 DIAGNOSIS — Z86718 Personal history of other venous thrombosis and embolism: Secondary | ICD-10-CM | POA: Diagnosis not present

## 2015-04-22 DIAGNOSIS — E039 Hypothyroidism, unspecified: Secondary | ICD-10-CM | POA: Diagnosis not present

## 2015-04-22 DIAGNOSIS — C73 Malignant neoplasm of thyroid gland: Secondary | ICD-10-CM | POA: Diagnosis not present

## 2015-04-22 DIAGNOSIS — R222 Localized swelling, mass and lump, trunk: Secondary | ICD-10-CM | POA: Diagnosis not present

## 2015-04-28 ENCOUNTER — Ambulatory Visit (INDEPENDENT_AMBULATORY_CARE_PROVIDER_SITE_OTHER): Payer: Medicare Other | Admitting: General Practice

## 2015-04-28 DIAGNOSIS — Z86718 Personal history of other venous thrombosis and embolism: Secondary | ICD-10-CM | POA: Diagnosis not present

## 2015-04-28 DIAGNOSIS — Z5181 Encounter for therapeutic drug level monitoring: Secondary | ICD-10-CM

## 2015-04-28 LAB — POCT INR: INR: 1.4

## 2015-04-28 NOTE — Progress Notes (Signed)
Pre visit review using our clinic review tool, if applicable. No additional management support is needed unless otherwise documented below in the visit note. 

## 2015-04-28 NOTE — Progress Notes (Signed)
I have reviewed and agree with the plan. 

## 2015-04-30 DIAGNOSIS — R911 Solitary pulmonary nodule: Secondary | ICD-10-CM | POA: Diagnosis not present

## 2015-04-30 DIAGNOSIS — R590 Localized enlarged lymph nodes: Secondary | ICD-10-CM | POA: Diagnosis not present

## 2015-04-30 DIAGNOSIS — C7989 Secondary malignant neoplasm of other specified sites: Secondary | ICD-10-CM | POA: Diagnosis not present

## 2015-04-30 DIAGNOSIS — C439 Malignant melanoma of skin, unspecified: Secondary | ICD-10-CM | POA: Diagnosis not present

## 2015-04-30 DIAGNOSIS — Z85820 Personal history of malignant melanoma of skin: Secondary | ICD-10-CM | POA: Diagnosis not present

## 2015-05-04 ENCOUNTER — Telehealth: Payer: Self-pay | Admitting: Family

## 2015-05-04 DIAGNOSIS — E890 Postprocedural hypothyroidism: Secondary | ICD-10-CM

## 2015-05-04 MED ORDER — THYROID 120 MG PO TABS: 120.0000 mg | ORAL_TABLET | Freq: Every day | ORAL | Status: DC

## 2015-05-04 NOTE — Telephone Encounter (Signed)
Notified [facility] 

## 2015-05-04 NOTE — Telephone Encounter (Signed)
Pt came by and needs a refill on his thyroid (ARMOUR) 120 MG tablet [[id number]].  He is completely out.  Can this be called in today?    [facility] [address]

## 2015-05-12 ENCOUNTER — Ambulatory Visit (INDEPENDENT_AMBULATORY_CARE_PROVIDER_SITE_OTHER): Payer: Medicare Other | Admitting: General Practice

## 2015-05-12 DIAGNOSIS — Z5181 Encounter for therapeutic drug level monitoring: Secondary | ICD-10-CM | POA: Diagnosis not present

## 2015-05-12 DIAGNOSIS — Z86718 Personal history of other venous thrombosis and embolism: Secondary | ICD-10-CM

## 2015-05-12 LAB — POCT INR: INR: 2.2

## 2015-05-12 NOTE — Progress Notes (Signed)
I have reviewed and agree with the plan. 

## 2015-05-12 NOTE — Progress Notes (Signed)
Pre visit review using our clinic review tool, if applicable. No additional management support is needed unless otherwise documented below in the visit note. 

## 2015-05-17 DIAGNOSIS — C73 Malignant neoplasm of thyroid gland: Secondary | ICD-10-CM | POA: Diagnosis not present

## 2015-05-18 DIAGNOSIS — C73 Malignant neoplasm of thyroid gland: Secondary | ICD-10-CM | POA: Diagnosis not present

## 2015-05-19 DIAGNOSIS — C73 Malignant neoplasm of thyroid gland: Secondary | ICD-10-CM | POA: Diagnosis not present

## 2015-05-20 DIAGNOSIS — C73 Malignant neoplasm of thyroid gland: Secondary | ICD-10-CM | POA: Diagnosis not present

## 2015-05-20 DIAGNOSIS — C4359 Malignant melanoma of other part of trunk: Secondary | ICD-10-CM | POA: Diagnosis not present

## 2015-05-20 DIAGNOSIS — C799 Secondary malignant neoplasm of unspecified site: Secondary | ICD-10-CM | POA: Diagnosis not present

## 2015-05-24 ENCOUNTER — Encounter: Payer: Self-pay | Admitting: Adult Health

## 2015-05-24 ENCOUNTER — Ambulatory Visit (INDEPENDENT_AMBULATORY_CARE_PROVIDER_SITE_OTHER): Payer: Medicare Other | Admitting: Adult Health

## 2015-05-24 VITALS — BP 140/80 | Temp 98.1°F | Wt 193.6 lb

## 2015-05-24 DIAGNOSIS — R61 Generalized hyperhidrosis: Secondary | ICD-10-CM

## 2015-05-24 DIAGNOSIS — R5383 Other fatigue: Secondary | ICD-10-CM | POA: Diagnosis not present

## 2015-05-24 LAB — POC URINALSYSI DIPSTICK (AUTOMATED)
BILIRUBIN UA: NEGATIVE
Glucose, UA: NEGATIVE
Ketones, UA: NEGATIVE
LEUKOCYTES UA: NEGATIVE
NITRITE UA: NEGATIVE
PH UA: 6
PROTEIN UA: NEGATIVE
RBC UA: NEGATIVE
Spec Grav, UA: 1.025
UROBILINOGEN UA: 0.2

## 2015-05-24 LAB — BASIC METABOLIC PANEL
BUN: 19 mg/dL (ref 6–23)
CALCIUM: 8.9 mg/dL (ref 8.4–10.5)
CO2: 29 mEq/L (ref 19–32)
Chloride: 102 mEq/L (ref 96–112)
Creatinine, Ser: 1.03 mg/dL (ref 0.40–1.50)
GFR: 73.36 mL/min (ref >60.00)
GLUCOSE: 101 mg/dL — AB (ref 70–99)
POTASSIUM: 4.3 meq/L (ref 3.5–5.1)
SODIUM: 139 meq/L (ref 135–145)

## 2015-05-24 LAB — HEPATIC FUNCTION PANEL
ALT: 18 U/L (ref 0–53)
AST: 20 U/L (ref 0–37)
Albumin: 3.6 g/dL (ref 3.5–5.2)
Alkaline Phosphatase: 93 U/L (ref 39–117)
BILIRUBIN DIRECT: 0.1 mg/dL (ref 0.0–0.3)
BILIRUBIN TOTAL: 0.5 mg/dL (ref 0.2–1.2)
TOTAL PROTEIN: 6.6 g/dL (ref 6.0–8.3)

## 2015-05-24 LAB — CBC
HEMATOCRIT: 40 % (ref 39.0–52.0)
Hemoglobin: 13.1 g/dL (ref 13.0–17.0)
MCHC: 32.7 g/dL (ref 30.0–36.0)
MCV: 84.9 fl (ref 78.0–100.0)
Platelets: 242 10*3/uL (ref 150.0–400.0)
RBC: 4.71 Mil/uL (ref 4.22–5.81)
RDW: 13.8 % (ref 11.5–15.5)
WBC: 6.7 10*3/uL (ref 4.0–10.5)

## 2015-05-24 NOTE — Progress Notes (Signed)
   Subjective:    Patient ID: Clarence Hopkins, male    DOB: 04-Dec-1930, 82 y.o.   MRN: [id number]  HPI  82 year old male who presents to the office today with vague complaints. He is a patient of NP [facility]. Last week he started Radioactive Iodine therapy for thyroid cancer a few days later he became fatigues, weak, at times nauseated and has had night sweats for the last 5 evenings. He has not had his temp taken during these events. He is supposed to have surgery in one week.   He is unsure if the feelings he is having is related to Nuclear Iodine therapy or something else.    Review of Systems  Constitutional: Positive for fever, chills, diaphoresis, activity change, appetite change, fatigue and unexpected weight change.  HENT: Negative.   Respiratory: Negative.   Cardiovascular: Negative.   Gastrointestinal: Positive for nausea. Negative for vomiting, abdominal pain, diarrhea, abdominal distention and rectal pain.  Genitourinary: Negative.   Neurological: Positive for weakness. Negative for dizziness, light-headedness and headaches.  Psychiatric/Behavioral: Negative.        Objective:   Physical Exam  Constitutional: He is oriented to person, place, and time. He appears well-developed and well-nourished. No distress.  HENT:  Head: Normocephalic and atraumatic.  Right Ear: External ear normal.  Left Ear: External ear normal.  Nose: Nose normal.  Mouth/Throat: Oropharynx is clear and moist. No oropharyngeal exudate.  Eyes: Conjunctivae and EOM are normal. Pupils are equal, round, and reactive to light. Right eye exhibits no discharge. Left eye exhibits no discharge.  Neck: Normal range of motion. Neck supple. No thyromegaly present.  Cardiovascular: Normal rate, regular rhythm, normal heart sounds and intact distal pulses.  Exam reveals no gallop and no friction rub.   No murmur heard. Pulmonary/Chest: Effort normal and breath sounds normal. No respiratory distress. He has no wheezes.  He has no rales. He exhibits no tenderness.  Abdominal: Soft. Bowel sounds are normal. He exhibits no distension and no mass. There is no tenderness. There is no rebound and no guarding.  Lymphadenopathy:    He has no cervical adenopathy.  Neurological: He is alert and oriented to person, place, and time.  Skin: Skin is warm. No rash noted. He is not diaphoretic. No erythema. No pallor.  Psychiatric: He has a normal mood and affect. His behavior is normal. Judgment and thought content normal.  Nursing note and vitals reviewed.     Assessment & Plan:  1. Night sweats - Possibly viral or bacterial illness.  - He has not traveled and exam did not reveal anything concerning for pneumonia.  - Will get labs and treat if necessary.  - CBC - Basic metabolic panel - POCT Urinalysis Dipstick (Automated) - Hepatic function panel  2. Other fatigue - Some of his symptoms can be explained from possible side effects of radioactive iodine therapy such as nausea and fatigue - CBC - Basic metabolic panel - POCT Urinalysis Dipstick (Automated) - Hepatic function panel  Dorothyann Peng, NP

## 2015-05-24 NOTE — Patient Instructions (Signed)
It was great meeting you today!  I will follow up with you once I get the results of the blood tests back.   Take tylenol as needed to help with the symptoms   Make sure you are staying hydrated.

## 2015-05-31 DIAGNOSIS — C4359 Malignant melanoma of other part of trunk: Secondary | ICD-10-CM | POA: Diagnosis not present

## 2015-05-31 DIAGNOSIS — C7989 Secondary malignant neoplasm of other specified sites: Secondary | ICD-10-CM | POA: Diagnosis not present

## 2015-05-31 DIAGNOSIS — Z981 Arthrodesis status: Secondary | ICD-10-CM | POA: Diagnosis not present

## 2015-05-31 DIAGNOSIS — C73 Malignant neoplasm of thyroid gland: Secondary | ICD-10-CM | POA: Diagnosis not present

## 2015-05-31 DIAGNOSIS — C799 Secondary malignant neoplasm of unspecified site: Secondary | ICD-10-CM | POA: Diagnosis not present

## 2015-05-31 DIAGNOSIS — C7800 Secondary malignant neoplasm of unspecified lung: Secondary | ICD-10-CM | POA: Diagnosis not present

## 2015-05-31 DIAGNOSIS — Z86718 Personal history of other venous thrombosis and embolism: Secondary | ICD-10-CM | POA: Diagnosis not present

## 2015-05-31 DIAGNOSIS — Z7901 Long term (current) use of anticoagulants: Secondary | ICD-10-CM | POA: Diagnosis not present

## 2015-05-31 DIAGNOSIS — Z87891 Personal history of nicotine dependence: Secondary | ICD-10-CM | POA: Diagnosis not present

## 2015-06-01 ENCOUNTER — Telehealth: Payer: Self-pay | Admitting: General Practice

## 2015-06-01 NOTE — Telephone Encounter (Signed)
Notified patient that Dr. Alain Marion has agreed to be his PCP.

## 2015-06-09 ENCOUNTER — Ambulatory Visit (INDEPENDENT_AMBULATORY_CARE_PROVIDER_SITE_OTHER): Payer: Medicare Other | Admitting: General Practice

## 2015-06-09 DIAGNOSIS — Z5181 Encounter for therapeutic drug level monitoring: Secondary | ICD-10-CM

## 2015-06-09 DIAGNOSIS — Z86718 Personal history of other venous thrombosis and embolism: Secondary | ICD-10-CM

## 2015-06-09 LAB — POCT INR: INR: 2.2

## 2015-06-09 NOTE — Progress Notes (Signed)
I have reviewed and agree with the plan. 

## 2015-06-09 NOTE — Progress Notes (Signed)
Pre visit review using our clinic review tool, if applicable. No additional management support is needed unless otherwise documented below in the visit note. 

## 2015-06-10 DIAGNOSIS — C439 Malignant melanoma of skin, unspecified: Secondary | ICD-10-CM | POA: Diagnosis not present

## 2015-06-14 DIAGNOSIS — R490 Dysphonia: Secondary | ICD-10-CM | POA: Diagnosis not present

## 2015-06-14 DIAGNOSIS — E039 Hypothyroidism, unspecified: Secondary | ICD-10-CM | POA: Diagnosis not present

## 2015-06-14 DIAGNOSIS — Z981 Arthrodesis status: Secondary | ICD-10-CM | POA: Diagnosis not present

## 2015-06-14 DIAGNOSIS — Z96659 Presence of unspecified artificial knee joint: Secondary | ICD-10-CM | POA: Diagnosis not present

## 2015-06-14 DIAGNOSIS — R251 Tremor, unspecified: Secondary | ICD-10-CM | POA: Diagnosis not present

## 2015-06-14 DIAGNOSIS — C799 Secondary malignant neoplasm of unspecified site: Secondary | ICD-10-CM | POA: Diagnosis not present

## 2015-06-14 DIAGNOSIS — Z86718 Personal history of other venous thrombosis and embolism: Secondary | ICD-10-CM | POA: Diagnosis not present

## 2015-06-14 DIAGNOSIS — R0602 Shortness of breath: Secondary | ICD-10-CM | POA: Diagnosis not present

## 2015-06-14 DIAGNOSIS — R0609 Other forms of dyspnea: Secondary | ICD-10-CM | POA: Diagnosis not present

## 2015-06-14 DIAGNOSIS — R918 Other nonspecific abnormal finding of lung field: Secondary | ICD-10-CM | POA: Diagnosis not present

## 2015-06-14 DIAGNOSIS — C73 Malignant neoplasm of thyroid gland: Secondary | ICD-10-CM | POA: Diagnosis not present

## 2015-06-14 DIAGNOSIS — R530 Neoplastic (malignant) related fatigue: Secondary | ICD-10-CM | POA: Diagnosis not present

## 2015-06-14 DIAGNOSIS — Z79899 Other long term (current) drug therapy: Secondary | ICD-10-CM | POA: Diagnosis not present

## 2015-06-14 DIAGNOSIS — R1310 Dysphagia, unspecified: Secondary | ICD-10-CM | POA: Diagnosis not present

## 2015-06-14 DIAGNOSIS — Z85820 Personal history of malignant melanoma of skin: Secondary | ICD-10-CM | POA: Diagnosis not present

## 2015-06-14 DIAGNOSIS — R05 Cough: Secondary | ICD-10-CM | POA: Diagnosis not present

## 2015-06-14 DIAGNOSIS — R221 Localized swelling, mass and lump, neck: Secondary | ICD-10-CM | POA: Diagnosis not present

## 2015-06-14 DIAGNOSIS — Z7901 Long term (current) use of anticoagulants: Secondary | ICD-10-CM | POA: Diagnosis not present

## 2015-06-21 DIAGNOSIS — L570 Actinic keratosis: Secondary | ICD-10-CM | POA: Diagnosis not present

## 2015-06-21 DIAGNOSIS — L720 Epidermal cyst: Secondary | ICD-10-CM | POA: Diagnosis not present

## 2015-06-21 DIAGNOSIS — D1801 Hemangioma of skin and subcutaneous tissue: Secondary | ICD-10-CM | POA: Diagnosis not present

## 2015-06-21 DIAGNOSIS — L812 Freckles: Secondary | ICD-10-CM | POA: Diagnosis not present

## 2015-06-21 DIAGNOSIS — L821 Other seborrheic keratosis: Secondary | ICD-10-CM | POA: Diagnosis not present

## 2015-06-21 DIAGNOSIS — Z85820 Personal history of malignant melanoma of skin: Secondary | ICD-10-CM | POA: Diagnosis not present

## 2015-06-21 DIAGNOSIS — C44519 Basal cell carcinoma of skin of other part of trunk: Secondary | ICD-10-CM | POA: Diagnosis not present

## 2015-06-21 DIAGNOSIS — D485 Neoplasm of uncertain behavior of skin: Secondary | ICD-10-CM | POA: Diagnosis not present

## 2015-06-21 DIAGNOSIS — D2271 Melanocytic nevi of right lower limb, including hip: Secondary | ICD-10-CM | POA: Diagnosis not present

## 2015-06-21 DIAGNOSIS — Z85828 Personal history of other malignant neoplasm of skin: Secondary | ICD-10-CM | POA: Diagnosis not present

## 2015-06-21 DIAGNOSIS — D1721 Benign lipomatous neoplasm of skin and subcutaneous tissue of right arm: Secondary | ICD-10-CM | POA: Diagnosis not present

## 2015-06-21 DIAGNOSIS — D2272 Melanocytic nevi of left lower limb, including hip: Secondary | ICD-10-CM | POA: Diagnosis not present

## 2015-07-07 ENCOUNTER — Ambulatory Visit (INDEPENDENT_AMBULATORY_CARE_PROVIDER_SITE_OTHER): Payer: Medicare Other | Admitting: General Practice

## 2015-07-07 ENCOUNTER — Other Ambulatory Visit: Payer: Self-pay | Admitting: General Practice

## 2015-07-07 DIAGNOSIS — Z5181 Encounter for therapeutic drug level monitoring: Secondary | ICD-10-CM | POA: Diagnosis not present

## 2015-07-07 DIAGNOSIS — Z86718 Personal history of other venous thrombosis and embolism: Secondary | ICD-10-CM | POA: Diagnosis not present

## 2015-07-07 LAB — POCT INR: INR: 1.8

## 2015-07-07 MED ORDER — TAMSULOSIN HCL 0.4 MG PO CAPS: 0.4000 mg | ORAL_CAPSULE | Freq: Every day | ORAL | Status: DC

## 2015-07-07 NOTE — Progress Notes (Signed)
I have reviewed and agree with the plan. 

## 2015-07-07 NOTE — Progress Notes (Signed)
Pre visit review using our clinic review tool, if applicable. No additional management support is needed unless otherwise documented below in the visit note. 

## 2015-08-04 ENCOUNTER — Ambulatory Visit (INDEPENDENT_AMBULATORY_CARE_PROVIDER_SITE_OTHER): Payer: Medicare Other | Admitting: General Practice

## 2015-08-04 DIAGNOSIS — Z86718 Personal history of other venous thrombosis and embolism: Secondary | ICD-10-CM

## 2015-08-04 DIAGNOSIS — Z5181 Encounter for therapeutic drug level monitoring: Secondary | ICD-10-CM

## 2015-08-04 LAB — POCT INR: INR: 2.3

## 2015-08-04 NOTE — Progress Notes (Signed)
I have reviewed and agree with the plan. 

## 2015-08-04 NOTE — Progress Notes (Signed)
Pre visit review using our clinic review tool, if applicable. No additional management support is needed unless otherwise documented below in the visit note. 

## 2015-08-12 ENCOUNTER — Other Ambulatory Visit: Payer: Self-pay | Admitting: *Deleted

## 2015-08-12 DIAGNOSIS — E890 Postprocedural hypothyroidism: Secondary | ICD-10-CM

## 2015-08-12 MED ORDER — THYROID 120 MG PO TABS: 120.0000 mg | ORAL_TABLET | Freq: Every day | ORAL | Status: DC

## 2015-08-16 ENCOUNTER — Other Ambulatory Visit: Payer: Self-pay

## 2015-09-01 ENCOUNTER — Ambulatory Visit (INDEPENDENT_AMBULATORY_CARE_PROVIDER_SITE_OTHER): Payer: Medicare Other | Admitting: General Practice

## 2015-09-01 DIAGNOSIS — Z86718 Personal history of other venous thrombosis and embolism: Secondary | ICD-10-CM | POA: Diagnosis not present

## 2015-09-01 DIAGNOSIS — Z5181 Encounter for therapeutic drug level monitoring: Secondary | ICD-10-CM

## 2015-09-01 LAB — POCT INR: INR: 2.5

## 2015-09-01 NOTE — Progress Notes (Signed)
I have reviewed and agree with the plan. 

## 2015-09-01 NOTE — Progress Notes (Signed)
Pre visit review using our clinic review tool, if applicable. No additional management support is needed unless otherwise documented below in the visit note. 

## 2015-09-20 DIAGNOSIS — R1310 Dysphagia, unspecified: Secondary | ICD-10-CM | POA: Diagnosis not present

## 2015-09-20 DIAGNOSIS — C439 Malignant melanoma of skin, unspecified: Secondary | ICD-10-CM | POA: Diagnosis not present

## 2015-09-20 DIAGNOSIS — R0609 Other forms of dyspnea: Secondary | ICD-10-CM | POA: Diagnosis not present

## 2015-09-20 DIAGNOSIS — N289 Disorder of kidney and ureter, unspecified: Secondary | ICD-10-CM | POA: Diagnosis not present

## 2015-09-20 DIAGNOSIS — C7802 Secondary malignant neoplasm of left lung: Secondary | ICD-10-CM | POA: Diagnosis not present

## 2015-09-20 DIAGNOSIS — R918 Other nonspecific abnormal finding of lung field: Secondary | ICD-10-CM | POA: Diagnosis not present

## 2015-09-20 DIAGNOSIS — K769 Liver disease, unspecified: Secondary | ICD-10-CM | POA: Diagnosis not present

## 2015-09-20 DIAGNOSIS — R490 Dysphonia: Secondary | ICD-10-CM | POA: Diagnosis not present

## 2015-09-20 DIAGNOSIS — C7801 Secondary malignant neoplasm of right lung: Secondary | ICD-10-CM | POA: Diagnosis not present

## 2015-09-20 DIAGNOSIS — C73 Malignant neoplasm of thyroid gland: Secondary | ICD-10-CM | POA: Diagnosis not present

## 2015-09-20 DIAGNOSIS — C799 Secondary malignant neoplasm of unspecified site: Secondary | ICD-10-CM | POA: Diagnosis not present

## 2015-09-27 DIAGNOSIS — R490 Dysphonia: Secondary | ICD-10-CM | POA: Diagnosis not present

## 2015-09-27 DIAGNOSIS — N289 Disorder of kidney and ureter, unspecified: Secondary | ICD-10-CM | POA: Diagnosis not present

## 2015-09-27 DIAGNOSIS — C7802 Secondary malignant neoplasm of left lung: Secondary | ICD-10-CM | POA: Diagnosis not present

## 2015-09-27 DIAGNOSIS — K769 Liver disease, unspecified: Secondary | ICD-10-CM | POA: Diagnosis not present

## 2015-09-27 DIAGNOSIS — C773 Secondary and unspecified malignant neoplasm of axilla and upper limb lymph nodes: Secondary | ICD-10-CM | POA: Diagnosis not present

## 2015-09-27 DIAGNOSIS — E039 Hypothyroidism, unspecified: Secondary | ICD-10-CM | POA: Diagnosis not present

## 2015-09-27 DIAGNOSIS — R0609 Other forms of dyspnea: Secondary | ICD-10-CM | POA: Diagnosis not present

## 2015-09-27 DIAGNOSIS — R918 Other nonspecific abnormal finding of lung field: Secondary | ICD-10-CM | POA: Diagnosis not present

## 2015-09-27 DIAGNOSIS — C7801 Secondary malignant neoplasm of right lung: Secondary | ICD-10-CM | POA: Diagnosis not present

## 2015-09-27 DIAGNOSIS — C73 Malignant neoplasm of thyroid gland: Secondary | ICD-10-CM | POA: Diagnosis not present

## 2015-09-27 DIAGNOSIS — Z803 Family history of malignant neoplasm of breast: Secondary | ICD-10-CM | POA: Diagnosis not present

## 2015-09-27 DIAGNOSIS — Z85820 Personal history of malignant melanoma of skin: Secondary | ICD-10-CM | POA: Diagnosis not present

## 2015-09-29 ENCOUNTER — Ambulatory Visit (INDEPENDENT_AMBULATORY_CARE_PROVIDER_SITE_OTHER): Payer: Medicare Other | Admitting: General Practice

## 2015-09-29 DIAGNOSIS — Z5181 Encounter for therapeutic drug level monitoring: Secondary | ICD-10-CM

## 2015-09-29 DIAGNOSIS — Z86718 Personal history of other venous thrombosis and embolism: Secondary | ICD-10-CM

## 2015-09-29 LAB — POCT INR: INR: 2.9

## 2015-09-29 NOTE — Progress Notes (Signed)
I have reviewed and agree with the plan. 

## 2015-10-06 DIAGNOSIS — C73 Malignant neoplasm of thyroid gland: Secondary | ICD-10-CM | POA: Diagnosis not present

## 2015-10-06 DIAGNOSIS — C439 Malignant melanoma of skin, unspecified: Secondary | ICD-10-CM | POA: Diagnosis not present

## 2015-10-12 ENCOUNTER — Other Ambulatory Visit: Payer: Self-pay | Admitting: Internal Medicine

## 2015-10-12 ENCOUNTER — Telehealth: Payer: Self-pay | Admitting: *Deleted

## 2015-10-12 NOTE — Telephone Encounter (Signed)
Left msg on triage stating he is needing refill on his warfarin usually [facility] refill med, but she is out of the office. [facility]

## 2015-10-13 ENCOUNTER — Other Ambulatory Visit: Payer: Self-pay

## 2015-10-13 MED ORDER — WARFARIN SODIUM 5 MG PO TABS: ORAL_TABLET | ORAL | 3 refills | Status: DC

## 2015-10-13 NOTE — Telephone Encounter (Signed)
Patient sees [facility]/coumadin clinic regularly, I have sent rx to pharm per patient request

## 2015-10-19 DIAGNOSIS — D1801 Hemangioma of skin and subcutaneous tissue: Secondary | ICD-10-CM | POA: Diagnosis not present

## 2015-10-19 DIAGNOSIS — D0339 Melanoma in situ of other parts of face: Secondary | ICD-10-CM | POA: Diagnosis not present

## 2015-10-19 DIAGNOSIS — D225 Melanocytic nevi of trunk: Secondary | ICD-10-CM | POA: Diagnosis not present

## 2015-10-19 DIAGNOSIS — L570 Actinic keratosis: Secondary | ICD-10-CM | POA: Diagnosis not present

## 2015-10-19 DIAGNOSIS — D485 Neoplasm of uncertain behavior of skin: Secondary | ICD-10-CM | POA: Diagnosis not present

## 2015-10-19 DIAGNOSIS — Z85820 Personal history of malignant melanoma of skin: Secondary | ICD-10-CM | POA: Diagnosis not present

## 2015-10-19 DIAGNOSIS — L821 Other seborrheic keratosis: Secondary | ICD-10-CM | POA: Diagnosis not present

## 2015-10-19 DIAGNOSIS — Z85828 Personal history of other malignant neoplasm of skin: Secondary | ICD-10-CM | POA: Diagnosis not present

## 2015-10-19 DIAGNOSIS — L812 Freckles: Secondary | ICD-10-CM | POA: Diagnosis not present

## 2015-10-27 ENCOUNTER — Ambulatory Visit (INDEPENDENT_AMBULATORY_CARE_PROVIDER_SITE_OTHER): Payer: Medicare Other | Admitting: General Practice

## 2015-10-27 DIAGNOSIS — Z86718 Personal history of other venous thrombosis and embolism: Secondary | ICD-10-CM

## 2015-10-27 DIAGNOSIS — Z5181 Encounter for therapeutic drug level monitoring: Secondary | ICD-10-CM | POA: Diagnosis not present

## 2015-10-27 LAB — POCT INR: INR: 2.6

## 2015-10-27 NOTE — Progress Notes (Signed)
I have reviewed and agree with the plan. 

## 2015-10-27 NOTE — Patient Instructions (Signed)
Skip coumadin the day before surgery.  After surgery take extra 1/2 tablet for 2 days and then continue taking normal dosage.

## 2015-11-01 DIAGNOSIS — D038 Melanoma in situ of other sites: Secondary | ICD-10-CM | POA: Diagnosis not present

## 2015-11-01 DIAGNOSIS — Z85828 Personal history of other malignant neoplasm of skin: Secondary | ICD-10-CM | POA: Diagnosis not present

## 2015-11-01 DIAGNOSIS — Z85820 Personal history of malignant melanoma of skin: Secondary | ICD-10-CM | POA: Diagnosis not present

## 2015-11-01 DIAGNOSIS — D0339 Melanoma in situ of other parts of face: Secondary | ICD-10-CM | POA: Diagnosis not present

## 2015-11-02 DIAGNOSIS — D0339 Melanoma in situ of other parts of face: Secondary | ICD-10-CM | POA: Diagnosis not present

## 2015-11-06 ENCOUNTER — Other Ambulatory Visit: Payer: Self-pay | Admitting: Family

## 2015-11-23 DIAGNOSIS — Z23 Encounter for immunization: Secondary | ICD-10-CM | POA: Diagnosis not present

## 2015-11-24 ENCOUNTER — Ambulatory Visit (INDEPENDENT_AMBULATORY_CARE_PROVIDER_SITE_OTHER): Payer: Medicare Other | Admitting: General Practice

## 2015-11-24 DIAGNOSIS — Z86718 Personal history of other venous thrombosis and embolism: Secondary | ICD-10-CM

## 2015-11-24 DIAGNOSIS — Z5181 Encounter for therapeutic drug level monitoring: Secondary | ICD-10-CM | POA: Diagnosis not present

## 2015-11-24 LAB — POCT INR: INR: 2.1

## 2015-11-24 NOTE — Progress Notes (Signed)
I have reviewed and agree with the plan. 

## 2015-12-08 ENCOUNTER — Other Ambulatory Visit: Payer: Self-pay | Admitting: Family

## 2015-12-22 ENCOUNTER — Ambulatory Visit (INDEPENDENT_AMBULATORY_CARE_PROVIDER_SITE_OTHER): Payer: Medicare Other | Admitting: General Practice

## 2015-12-22 DIAGNOSIS — Z86718 Personal history of other venous thrombosis and embolism: Secondary | ICD-10-CM

## 2015-12-22 DIAGNOSIS — Z5181 Encounter for therapeutic drug level monitoring: Secondary | ICD-10-CM

## 2015-12-22 LAB — POCT INR: INR: 2.3

## 2015-12-22 NOTE — Progress Notes (Signed)
I have reviewed and agree with the plan. 

## 2015-12-22 NOTE — Patient Instructions (Signed)
Pre visit review using our clinic review tool, if applicable. No additional management support is needed unless otherwise documented below in the visit note. 

## 2015-12-28 DIAGNOSIS — C439 Malignant melanoma of skin, unspecified: Secondary | ICD-10-CM | POA: Diagnosis not present

## 2015-12-28 DIAGNOSIS — C73 Malignant neoplasm of thyroid gland: Secondary | ICD-10-CM | POA: Diagnosis not present

## 2015-12-28 DIAGNOSIS — R590 Localized enlarged lymph nodes: Secondary | ICD-10-CM | POA: Diagnosis not present

## 2015-12-28 DIAGNOSIS — N281 Cyst of kidney, acquired: Secondary | ICD-10-CM | POA: Diagnosis not present

## 2015-12-28 DIAGNOSIS — R918 Other nonspecific abnormal finding of lung field: Secondary | ICD-10-CM | POA: Diagnosis not present

## 2015-12-28 DIAGNOSIS — K7689 Other specified diseases of liver: Secondary | ICD-10-CM | POA: Diagnosis not present

## 2015-12-28 DIAGNOSIS — Z01818 Encounter for other preprocedural examination: Secondary | ICD-10-CM | POA: Diagnosis not present

## 2015-12-28 DIAGNOSIS — R599 Enlarged lymph nodes, unspecified: Secondary | ICD-10-CM | POA: Diagnosis not present

## 2015-12-28 DIAGNOSIS — E890 Postprocedural hypothyroidism: Secondary | ICD-10-CM | POA: Diagnosis not present

## 2016-01-09 ENCOUNTER — Other Ambulatory Visit: Payer: Self-pay | Admitting: Family

## 2016-01-09 DIAGNOSIS — E890 Postprocedural hypothyroidism: Secondary | ICD-10-CM

## 2016-01-10 MED ORDER — THYROID 120 MG PO TABS: 120.0000 mg | ORAL_TABLET | Freq: Every day | ORAL | 0 refills | Status: DC

## 2016-01-10 NOTE — Telephone Encounter (Signed)
Pt left msg on triage requesting refill on armour [facility]. Only sent 30 day pt is due for cpx in Dec.../lmb

## 2016-01-19 ENCOUNTER — Ambulatory Visit (INDEPENDENT_AMBULATORY_CARE_PROVIDER_SITE_OTHER): Payer: Medicare Other | Admitting: General Practice

## 2016-01-19 DIAGNOSIS — Z86718 Personal history of other venous thrombosis and embolism: Secondary | ICD-10-CM

## 2016-01-19 DIAGNOSIS — Z5181 Encounter for therapeutic drug level monitoring: Secondary | ICD-10-CM

## 2016-01-19 LAB — POCT INR: INR: 2.1

## 2016-01-19 NOTE — Patient Instructions (Signed)
Pre visit review using our clinic review tool, if applicable. No additional management support is needed unless otherwise documented below in the visit note. 

## 2016-01-20 DIAGNOSIS — C773 Secondary and unspecified malignant neoplasm of axilla and upper limb lymph nodes: Secondary | ICD-10-CM | POA: Diagnosis not present

## 2016-01-20 DIAGNOSIS — R0609 Other forms of dyspnea: Secondary | ICD-10-CM | POA: Diagnosis not present

## 2016-01-20 DIAGNOSIS — C73 Malignant neoplasm of thyroid gland: Secondary | ICD-10-CM | POA: Diagnosis not present

## 2016-01-20 DIAGNOSIS — C7801 Secondary malignant neoplasm of right lung: Secondary | ICD-10-CM | POA: Diagnosis not present

## 2016-01-20 DIAGNOSIS — R1310 Dysphagia, unspecified: Secondary | ICD-10-CM | POA: Diagnosis not present

## 2016-01-20 DIAGNOSIS — C7802 Secondary malignant neoplasm of left lung: Secondary | ICD-10-CM | POA: Diagnosis not present

## 2016-01-20 DIAGNOSIS — R918 Other nonspecific abnormal finding of lung field: Secondary | ICD-10-CM | POA: Diagnosis not present

## 2016-01-20 DIAGNOSIS — R490 Dysphonia: Secondary | ICD-10-CM | POA: Diagnosis not present

## 2016-01-20 DIAGNOSIS — C799 Secondary malignant neoplasm of unspecified site: Secondary | ICD-10-CM | POA: Diagnosis not present

## 2016-01-20 DIAGNOSIS — Z803 Family history of malignant neoplasm of breast: Secondary | ICD-10-CM | POA: Diagnosis not present

## 2016-01-20 DIAGNOSIS — Z79899 Other long term (current) drug therapy: Secondary | ICD-10-CM | POA: Diagnosis not present

## 2016-01-20 DIAGNOSIS — E039 Hypothyroidism, unspecified: Secondary | ICD-10-CM | POA: Diagnosis not present

## 2016-01-20 DIAGNOSIS — R530 Neoplastic (malignant) related fatigue: Secondary | ICD-10-CM | POA: Diagnosis not present

## 2016-01-25 DIAGNOSIS — D1801 Hemangioma of skin and subcutaneous tissue: Secondary | ICD-10-CM | POA: Diagnosis not present

## 2016-01-25 DIAGNOSIS — L821 Other seborrheic keratosis: Secondary | ICD-10-CM | POA: Diagnosis not present

## 2016-01-25 DIAGNOSIS — L720 Epidermal cyst: Secondary | ICD-10-CM | POA: Diagnosis not present

## 2016-01-25 DIAGNOSIS — L929 Granulomatous disorder of the skin and subcutaneous tissue, unspecified: Secondary | ICD-10-CM | POA: Diagnosis not present

## 2016-01-25 DIAGNOSIS — Z85828 Personal history of other malignant neoplasm of skin: Secondary | ICD-10-CM | POA: Diagnosis not present

## 2016-01-25 DIAGNOSIS — D485 Neoplasm of uncertain behavior of skin: Secondary | ICD-10-CM | POA: Diagnosis not present

## 2016-01-25 DIAGNOSIS — Z85820 Personal history of malignant melanoma of skin: Secondary | ICD-10-CM | POA: Diagnosis not present

## 2016-01-25 DIAGNOSIS — L570 Actinic keratosis: Secondary | ICD-10-CM | POA: Diagnosis not present

## 2016-01-25 DIAGNOSIS — L812 Freckles: Secondary | ICD-10-CM | POA: Diagnosis not present

## 2016-01-27 DIAGNOSIS — R1310 Dysphagia, unspecified: Secondary | ICD-10-CM | POA: Diagnosis not present

## 2016-02-01 DIAGNOSIS — R490 Dysphonia: Secondary | ICD-10-CM | POA: Diagnosis not present

## 2016-02-01 DIAGNOSIS — J383 Other diseases of vocal cords: Secondary | ICD-10-CM | POA: Diagnosis not present

## 2016-02-01 DIAGNOSIS — Z79899 Other long term (current) drug therapy: Secondary | ICD-10-CM | POA: Diagnosis not present

## 2016-02-01 DIAGNOSIS — R1310 Dysphagia, unspecified: Secondary | ICD-10-CM | POA: Diagnosis not present

## 2016-02-01 DIAGNOSIS — Z87891 Personal history of nicotine dependence: Secondary | ICD-10-CM | POA: Diagnosis not present

## 2016-02-01 DIAGNOSIS — R1312 Dysphagia, oropharyngeal phase: Secondary | ICD-10-CM | POA: Diagnosis not present

## 2016-02-01 DIAGNOSIS — Z85828 Personal history of other malignant neoplasm of skin: Secondary | ICD-10-CM | POA: Diagnosis not present

## 2016-02-01 DIAGNOSIS — D6859 Other primary thrombophilia: Secondary | ICD-10-CM | POA: Diagnosis not present

## 2016-02-01 DIAGNOSIS — C439 Malignant melanoma of skin, unspecified: Secondary | ICD-10-CM | POA: Diagnosis not present

## 2016-02-01 DIAGNOSIS — Z7901 Long term (current) use of anticoagulants: Secondary | ICD-10-CM | POA: Diagnosis not present

## 2016-02-01 DIAGNOSIS — C73 Malignant neoplasm of thyroid gland: Secondary | ICD-10-CM | POA: Diagnosis not present

## 2016-02-01 DIAGNOSIS — Z86718 Personal history of other venous thrombosis and embolism: Secondary | ICD-10-CM | POA: Diagnosis not present

## 2016-02-04 ENCOUNTER — Ambulatory Visit (INDEPENDENT_AMBULATORY_CARE_PROVIDER_SITE_OTHER): Payer: Medicare Other | Admitting: Internal Medicine

## 2016-02-04 ENCOUNTER — Encounter: Payer: Self-pay | Admitting: Internal Medicine

## 2016-02-04 ENCOUNTER — Other Ambulatory Visit (INDEPENDENT_AMBULATORY_CARE_PROVIDER_SITE_OTHER): Payer: Medicare Other

## 2016-02-04 VITALS — BP 138/82 | HR 66 | Ht 76.0 in | Wt 208.0 lb

## 2016-02-04 DIAGNOSIS — Z01818 Encounter for other preprocedural examination: Secondary | ICD-10-CM

## 2016-02-04 DIAGNOSIS — E785 Hyperlipidemia, unspecified: Secondary | ICD-10-CM | POA: Diagnosis not present

## 2016-02-04 DIAGNOSIS — R202 Paresthesia of skin: Secondary | ICD-10-CM

## 2016-02-04 DIAGNOSIS — R972 Elevated prostate specific antigen [PSA]: Secondary | ICD-10-CM

## 2016-02-04 DIAGNOSIS — Z23 Encounter for immunization: Secondary | ICD-10-CM

## 2016-02-04 DIAGNOSIS — N320 Bladder-neck obstruction: Secondary | ICD-10-CM

## 2016-02-04 DIAGNOSIS — E559 Vitamin D deficiency, unspecified: Secondary | ICD-10-CM | POA: Diagnosis not present

## 2016-02-04 DIAGNOSIS — E890 Postprocedural hypothyroidism: Secondary | ICD-10-CM

## 2016-02-04 DIAGNOSIS — C439 Malignant melanoma of skin, unspecified: Secondary | ICD-10-CM

## 2016-02-04 DIAGNOSIS — C73 Malignant neoplasm of thyroid gland: Secondary | ICD-10-CM

## 2016-02-04 DIAGNOSIS — Z86718 Personal history of other venous thrombosis and embolism: Secondary | ICD-10-CM

## 2016-02-04 DIAGNOSIS — D6859 Other primary thrombophilia: Secondary | ICD-10-CM

## 2016-02-04 LAB — CBC WITH DIFFERENTIAL/PLATELET
Basophils Absolute: 0 10*3/uL (ref 0.0–0.1)
Basophils Relative: 0.4 % (ref 0.0–3.0)
EOS PCT: 2.1 % (ref 0.0–5.0)
Eosinophils Absolute: 0.1 10*3/uL (ref 0.0–0.7)
HEMATOCRIT: 42.4 % (ref 39.0–52.0)
HEMOGLOBIN: 14 g/dL (ref 13.0–17.0)
Lymphocytes Relative: 30.1 % (ref 12.0–46.0)
Lymphs Abs: 1.6 10*3/uL (ref 0.7–4.0)
MCHC: 33 g/dL (ref 30.0–36.0)
MCV: 85 fl (ref 78.0–100.0)
MONOS PCT: 9.7 % (ref 3.0–12.0)
Monocytes Absolute: 0.5 10*3/uL (ref 0.1–1.0)
Neutro Abs: 3 10*3/uL (ref 1.4–7.7)
Neutrophils Relative %: 57.7 % (ref 43.0–77.0)
Platelets: 202 10*3/uL (ref 150.0–400.0)
RBC: 4.99 Mil/uL (ref 4.22–5.81)
RDW: 14.2 % (ref 11.5–15.5)
WBC: 5.2 10*3/uL (ref 4.0–10.5)

## 2016-02-04 LAB — URINALYSIS
Bilirubin Urine: NEGATIVE
HGB URINE DIPSTICK: NEGATIVE
KETONES UR: NEGATIVE
LEUKOCYTES UA: NEGATIVE
Nitrite: NEGATIVE
Specific Gravity, Urine: 1.015 (ref 1.000–1.030)
Total Protein, Urine: NEGATIVE
URINE GLUCOSE: NEGATIVE
UROBILINOGEN UA: 0.2 (ref 0.0–1.0)
pH: 7 (ref 5.0–8.0)

## 2016-02-04 LAB — LIPID PANEL
CHOL/HDL RATIO: 4
CHOLESTEROL: 147 mg/dL (ref 0–200)
HDL: 41.2 mg/dL (ref >39.00)
LDL CALC: 90 mg/dL (ref 0–99)
NONHDL: 105.66
Triglycerides: 79 mg/dL (ref 0.0–149.0)
VLDL: 15.8 mg/dL (ref 0.0–40.0)

## 2016-02-04 LAB — HEPATIC FUNCTION PANEL
ALBUMIN: 4.3 g/dL (ref 3.5–5.2)
ALT: 17 U/L (ref 0–53)
AST: 21 U/L (ref 0–37)
Alkaline Phosphatase: 69 U/L (ref 39–117)
BILIRUBIN TOTAL: 0.6 mg/dL (ref 0.2–1.2)
Bilirubin, Direct: 0.1 mg/dL (ref 0.0–0.3)
Total Protein: 7.1 g/dL (ref 6.0–8.3)

## 2016-02-04 LAB — BASIC METABOLIC PANEL
BUN: 16 mg/dL (ref 6–23)
CHLORIDE: 105 meq/L (ref 96–112)
CO2: 28 meq/L (ref 19–32)
CREATININE: 1.12 mg/dL (ref 0.40–1.50)
Calcium: 9.2 mg/dL (ref 8.4–10.5)
GFR: 66.49 mL/min (ref >60.00)
Glucose, Bld: 92 mg/dL (ref 70–99)
POTASSIUM: 4.8 meq/L (ref 3.5–5.1)
Sodium: 143 mEq/L (ref 135–145)

## 2016-02-04 LAB — PSA: PSA: 6.44 ng/mL — AB (ref 0.10–4.00)

## 2016-02-04 LAB — VITAMIN B12: Vitamin B-12: 1097 pg/mL — ABNORMAL HIGH (ref 211–911)

## 2016-02-04 NOTE — Assessment & Plan Note (Addendum)
Remote. On Coumadin

## 2016-02-04 NOTE — Progress Notes (Signed)
Pre visit review using our clinic review tool, if applicable. No additional management support is needed unless otherwise documented below in the visit note. 

## 2016-02-04 NOTE — Patient Instructions (Signed)
Cardiac ECHO Myoview heart stress test PFT (pulmonary function tests)

## 2016-02-04 NOTE — Progress Notes (Signed)
Subjective:  Patient ID: Clarence Hopkins, male    DOB: Aug 10, 1930  Age: 82 y.o. MRN: [id number]  CC: No chief complaint on file.   HPI TOLBERT MATHESON presents for recurrent melanoma [facility]), thyroid ca, hypothyroidism f/u  Hx: History of presumably thyroid carcinoma incompletely resected 1971 (details unknown) with evidence of recurrence.  Remote WDTC with evidence of local recurrence with metastatic disease to lungs (+FNA Left neck node and right lung transbronchial FNA). There have been innumerable small sub cm lung nodules relatively stable over the years 2003-2009 at least when last imaged until now 2017 that there is a large right mass and a larger left base mass that was probably 89m Over 10 years ago ([facility] scans) but now was measured 1.2,1.5 and now 1.7 just in the course of this years imaging showing progression as expected as these were FDG avid. There was historically fdg avid left supraclavic and neck small nodes that are still present and appear stable in left neck due to metastatic thyroid ca (presumably ptc per fna) Per pts preference have opted for short term monitoring but today we had a recap and long discussion and I am recommending he proceed with surgery: Total thyroidectomy and clean out on the left; should have a high quality [place] entire cervical node chains for mapping preop but I think is limited to left neck likely central up to level2. Based on prior imaging if a supraclavic node/mediastinal accessible would take them too.  Preop would suggest eval vc function. ?left paresis, has hoarseness.- will refer to ENT if surgeon doesn't first arrange a look down. Given the choking will order eval swallow study.  Risk of surgery mostly injury to cord and pth. Would just recommend extra care on the right where there does not appear to be tumor to preserve those parathyroids/cord preferentially. The left is likely maligant tissue hopefully nothing invasive.  Afterwards he will see me back  for postoperative radioiodine therapy for ablation as well as adjuvant. He may need more than one dose to target lungs if we are fortunate to have uptake.  He will need LID (check u iodine) and withdrawal at that point- to be detailed later.  At this point I think it is important to keep his TSH suppressed to <0.1 as he has active disease. I am switching him off armour to 150 synthroid and asked he have f/u TSH locally in ~6wks to make sure we have adequate suppression. He can tag this onto his inr checks montly  I will also update Dr. ODorita Frayas to his progress and my plan including my recommendations for his surgery and the update on his melanoma surgery.      Outpatient Medications Prior to Visit  Medication Sig Dispense Refill  . tamsulosin (FLOMAX) 0.4 MG CAPS capsule Take 1 capsule (0.4 mg total) by mouth daily after supper. Yearly physical due in Dec must make appt for future refills 30 capsule 0  . warfarin (COUMADIN) 5 MG tablet Take as directed by anticoagulation clinic 105 tablet 3  . thyroid (ARMOUR) 120 MG tablet Take 1 tablet (120 mg total) by mouth daily before breakfast. Yearly physical due in Dec must see Greg for future refills 30 tablet 0   No facility-administered medications prior to visit.     ROS Review of Systems  Constitutional: Negative for appetite change, fatigue and unexpected weight change.  HENT: Negative for congestion, nosebleeds, sneezing, sore throat and trouble swallowing.   Eyes: Negative for  itching and visual disturbance.  Respiratory: Negative for cough.   Cardiovascular: Negative for chest pain, palpitations and leg swelling.  Gastrointestinal: Negative for abdominal distention, blood in stool, diarrhea and nausea.  Genitourinary: Negative for frequency and hematuria.  Musculoskeletal: Negative for back pain, gait problem, joint swelling and neck pain.  Skin: Negative for rash.  Neurological: Negative for dizziness, tremors, speech difficulty and  weakness.  Psychiatric/Behavioral: Negative for agitation, dysphoric mood, sleep disturbance and suicidal ideas. The patient is not nervous/anxious.     Objective:  BP 138/82   Pulse 66   Ht '6\' 4"'  (1.93 m)   Wt 208 lb (94.3 kg)   SpO2 98%   BMI 25.32 kg/m   BP Readings from Last 3 Encounters:  02/04/16 138/82  05/24/15 140/80  02/02/15 130/82    Wt Readings from Last 3 Encounters:  02/04/16 208 lb (94.3 kg)  05/24/15 193 lb 9.6 oz (87.8 kg)  02/02/15 200 lb (90.7 kg)    Physical Exam  Constitutional: He is oriented to person, place, and time. He appears well-developed. No distress.  NAD  HENT:  Mouth/Throat: Oropharynx is clear and moist.  Eyes: Conjunctivae are normal. Pupils are equal, round, and reactive to light.  Neck: Normal range of motion. No JVD present. No tracheal deviation present. No thyromegaly present.  Cardiovascular: Normal rate, regular rhythm, normal heart sounds and intact distal pulses.  Exam reveals no gallop and no friction rub.   No murmur heard. Pulmonary/Chest: Effort normal and breath sounds normal. No respiratory distress. He has no wheezes. He has no rales. He exhibits no tenderness.  Abdominal: Soft. Bowel sounds are normal. He exhibits no distension and no mass. There is no tenderness. There is no rebound and no guarding.  Musculoskeletal: Normal range of motion. He exhibits no edema or tenderness.  Lymphadenopathy:    He has cervical adenopathy.  Neurological: He is alert and oriented to person, place, and time. He has normal reflexes. No cranial nerve deficit. He exhibits normal muscle tone. He displays a negative Romberg sign. Coordination and gait normal.  Skin: Skin is warm and dry. No rash noted.  Psychiatric: He has a normal mood and affect. His behavior is normal. Judgment and thought content normal.  L neck deformity and a vague fullness  Lab Results  Component Value Date   WBC 6.7 05/24/2015   HGB 13.1 05/24/2015   HCT 40.0  05/24/2015   PLT 242.0 05/24/2015   GLUCOSE 101 (H) 05/24/2015   CHOL 134 02/02/2015   TRIG 69.0 02/02/2015   HDL 35.70 (L) 02/02/2015   LDLDIRECT 116.0 03/31/2013   LDLCALC 85 02/02/2015   ALT 18 05/24/2015   AST 20 05/24/2015   NA 139 05/24/2015   K 4.3 05/24/2015   CL 102 05/24/2015   CREATININE 1.03 05/24/2015   BUN 19 05/24/2015   CO2 29 05/24/2015   TSH 1.52 02/02/2015   PSA 4.38 (H) 12/22/2013   INR 2.1 01/19/2016    No results found.  Assessment & Plan:   There are no diagnoses linked to this encounter. I have discontinued Mr. Hidrogo thyroid. I am also having him maintain his warfarin, tamsulosin, and levothyroxine.  Meds ordered this encounter  Medications  . levothyroxine (SYNTHROID, LEVOTHROID) 175 MCG tablet    Sig: Take 1 tablet by mouth daily.     Follow-up: No Follow-up on file.  Walker Kehr, MD

## 2016-02-08 ENCOUNTER — Other Ambulatory Visit: Payer: Self-pay | Admitting: Family

## 2016-02-08 DIAGNOSIS — E890 Postprocedural hypothyroidism: Secondary | ICD-10-CM

## 2016-02-09 ENCOUNTER — Other Ambulatory Visit: Payer: Self-pay | Admitting: Family

## 2016-02-09 DIAGNOSIS — H524 Presbyopia: Secondary | ICD-10-CM | POA: Diagnosis not present

## 2016-02-09 DIAGNOSIS — H04123 Dry eye syndrome of bilateral lacrimal glands: Secondary | ICD-10-CM | POA: Diagnosis not present

## 2016-02-09 DIAGNOSIS — H16103 Unspecified superficial keratitis, bilateral: Secondary | ICD-10-CM | POA: Diagnosis not present

## 2016-02-09 DIAGNOSIS — Z961 Presence of intraocular lens: Secondary | ICD-10-CM | POA: Diagnosis not present

## 2016-02-10 DIAGNOSIS — C73 Malignant neoplasm of thyroid gland: Secondary | ICD-10-CM | POA: Insufficient documentation

## 2016-02-10 NOTE — Assessment & Plan Note (Signed)
2003, 2014 s/p resection

## 2016-02-10 NOTE — Assessment & Plan Note (Signed)
F/u w/Dr Borden 

## 2016-02-10 NOTE — Assessment & Plan Note (Signed)
On Coumadin 

## 2016-02-10 NOTE — Assessment & Plan Note (Signed)
On Armour

## 2016-02-10 NOTE — Assessment & Plan Note (Signed)
2017 - recurrence  F/u w/surgery and Endo

## 2016-02-16 ENCOUNTER — Ambulatory Visit (INDEPENDENT_AMBULATORY_CARE_PROVIDER_SITE_OTHER): Payer: Medicare Other | Admitting: General Practice

## 2016-02-16 DIAGNOSIS — Z5181 Encounter for therapeutic drug level monitoring: Secondary | ICD-10-CM | POA: Diagnosis not present

## 2016-02-16 DIAGNOSIS — Z86718 Personal history of other venous thrombosis and embolism: Secondary | ICD-10-CM

## 2016-02-16 LAB — POCT INR: INR: 2

## 2016-02-16 NOTE — Patient Instructions (Signed)
Pre visit review using our clinic review tool, if applicable. No additional management support is needed unless otherwise documented below in the visit note. 

## 2016-02-16 NOTE — Progress Notes (Signed)
I have reviewed and agree with the plan. 

## 2016-03-07 DIAGNOSIS — Z7901 Long term (current) use of anticoagulants: Secondary | ICD-10-CM | POA: Diagnosis not present

## 2016-03-07 DIAGNOSIS — C73 Malignant neoplasm of thyroid gland: Secondary | ICD-10-CM | POA: Diagnosis not present

## 2016-03-07 DIAGNOSIS — D6859 Other primary thrombophilia: Secondary | ICD-10-CM | POA: Diagnosis not present

## 2016-03-08 ENCOUNTER — Other Ambulatory Visit: Payer: Self-pay | Admitting: Family

## 2016-03-15 ENCOUNTER — Ambulatory Visit (INDEPENDENT_AMBULATORY_CARE_PROVIDER_SITE_OTHER): Payer: Medicare Other | Admitting: General Practice

## 2016-03-15 DIAGNOSIS — Z5181 Encounter for therapeutic drug level monitoring: Secondary | ICD-10-CM

## 2016-03-15 DIAGNOSIS — Z86718 Personal history of other venous thrombosis and embolism: Secondary | ICD-10-CM

## 2016-03-15 LAB — POCT INR: INR: 2.6

## 2016-03-15 NOTE — Patient Instructions (Signed)
Pre visit review using our clinic review tool, if applicable. No additional management support is needed unless otherwise documented below in the visit note. 

## 2016-03-15 NOTE — Progress Notes (Signed)
I have reviewed and agree with the plan. 

## 2016-03-27 DIAGNOSIS — Z87891 Personal history of nicotine dependence: Secondary | ICD-10-CM | POA: Diagnosis not present

## 2016-03-27 DIAGNOSIS — N281 Cyst of kidney, acquired: Secondary | ICD-10-CM | POA: Diagnosis not present

## 2016-03-27 DIAGNOSIS — Z86718 Personal history of other venous thrombosis and embolism: Secondary | ICD-10-CM | POA: Diagnosis not present

## 2016-03-27 DIAGNOSIS — Z01818 Encounter for other preprocedural examination: Secondary | ICD-10-CM | POA: Diagnosis not present

## 2016-03-27 DIAGNOSIS — R1310 Dysphagia, unspecified: Secondary | ICD-10-CM | POA: Diagnosis not present

## 2016-03-27 DIAGNOSIS — Z7901 Long term (current) use of anticoagulants: Secondary | ICD-10-CM | POA: Diagnosis not present

## 2016-03-27 DIAGNOSIS — R918 Other nonspecific abnormal finding of lung field: Secondary | ICD-10-CM | POA: Diagnosis not present

## 2016-03-27 DIAGNOSIS — R0602 Shortness of breath: Secondary | ICD-10-CM | POA: Diagnosis not present

## 2016-03-27 DIAGNOSIS — Z85820 Personal history of malignant melanoma of skin: Secondary | ICD-10-CM | POA: Diagnosis not present

## 2016-03-27 DIAGNOSIS — Z803 Family history of malignant neoplasm of breast: Secondary | ICD-10-CM | POA: Diagnosis not present

## 2016-03-27 DIAGNOSIS — Z86711 Personal history of pulmonary embolism: Secondary | ICD-10-CM | POA: Diagnosis not present

## 2016-03-27 DIAGNOSIS — Z85850 Personal history of malignant neoplasm of thyroid: Secondary | ICD-10-CM | POA: Diagnosis not present

## 2016-03-27 DIAGNOSIS — R05 Cough: Secondary | ICD-10-CM | POA: Diagnosis not present

## 2016-04-12 ENCOUNTER — Ambulatory Visit: Payer: Medicare Other

## 2016-04-14 ENCOUNTER — Ambulatory Visit (INDEPENDENT_AMBULATORY_CARE_PROVIDER_SITE_OTHER): Payer: Medicare Other | Admitting: General Practice

## 2016-04-14 DIAGNOSIS — Z5181 Encounter for therapeutic drug level monitoring: Secondary | ICD-10-CM

## 2016-04-14 DIAGNOSIS — Z86718 Personal history of other venous thrombosis and embolism: Secondary | ICD-10-CM

## 2016-04-14 LAB — POCT INR: INR: 2

## 2016-04-14 NOTE — Patient Instructions (Signed)
Pre visit review using our clinic review tool, if applicable. No additional management support is needed unless otherwise documented below in the visit note. 

## 2016-04-14 NOTE — Progress Notes (Signed)
I have reviewed and agree with the plan. 

## 2016-04-28 DIAGNOSIS — Z79899 Other long term (current) drug therapy: Secondary | ICD-10-CM | POA: Diagnosis not present

## 2016-04-28 DIAGNOSIS — C7989 Secondary malignant neoplasm of other specified sites: Secondary | ICD-10-CM | POA: Diagnosis not present

## 2016-04-28 DIAGNOSIS — Z7901 Long term (current) use of anticoagulants: Secondary | ICD-10-CM | POA: Diagnosis not present

## 2016-04-28 DIAGNOSIS — N289 Disorder of kidney and ureter, unspecified: Secondary | ICD-10-CM | POA: Diagnosis not present

## 2016-04-28 DIAGNOSIS — K769 Liver disease, unspecified: Secondary | ICD-10-CM | POA: Diagnosis not present

## 2016-04-28 DIAGNOSIS — C7802 Secondary malignant neoplasm of left lung: Secondary | ICD-10-CM | POA: Diagnosis not present

## 2016-04-28 DIAGNOSIS — L989 Disorder of the skin and subcutaneous tissue, unspecified: Secondary | ICD-10-CM | POA: Diagnosis not present

## 2016-04-28 DIAGNOSIS — Z86711 Personal history of pulmonary embolism: Secondary | ICD-10-CM | POA: Diagnosis not present

## 2016-04-28 DIAGNOSIS — C73 Malignant neoplasm of thyroid gland: Secondary | ICD-10-CM | POA: Diagnosis not present

## 2016-04-28 DIAGNOSIS — R918 Other nonspecific abnormal finding of lung field: Secondary | ICD-10-CM | POA: Diagnosis not present

## 2016-04-28 DIAGNOSIS — C799 Secondary malignant neoplasm of unspecified site: Secondary | ICD-10-CM | POA: Diagnosis not present

## 2016-04-28 DIAGNOSIS — E039 Hypothyroidism, unspecified: Secondary | ICD-10-CM | POA: Diagnosis not present

## 2016-04-28 DIAGNOSIS — C7801 Secondary malignant neoplasm of right lung: Secondary | ICD-10-CM | POA: Diagnosis not present

## 2016-04-28 DIAGNOSIS — Z85850 Personal history of malignant neoplasm of thyroid: Secondary | ICD-10-CM | POA: Diagnosis not present

## 2016-04-28 DIAGNOSIS — Z981 Arthrodesis status: Secondary | ICD-10-CM | POA: Diagnosis not present

## 2016-04-28 DIAGNOSIS — Z86718 Personal history of other venous thrombosis and embolism: Secondary | ICD-10-CM | POA: Diagnosis not present

## 2016-04-28 DIAGNOSIS — R1310 Dysphagia, unspecified: Secondary | ICD-10-CM | POA: Diagnosis not present

## 2016-04-28 DIAGNOSIS — Z85820 Personal history of malignant melanoma of skin: Secondary | ICD-10-CM | POA: Diagnosis not present

## 2016-04-28 DIAGNOSIS — R5383 Other fatigue: Secondary | ICD-10-CM | POA: Diagnosis not present

## 2016-04-28 DIAGNOSIS — Z803 Family history of malignant neoplasm of breast: Secondary | ICD-10-CM | POA: Diagnosis not present

## 2016-04-28 DIAGNOSIS — R05 Cough: Secondary | ICD-10-CM | POA: Diagnosis not present

## 2016-04-28 DIAGNOSIS — Z96659 Presence of unspecified artificial knee joint: Secondary | ICD-10-CM | POA: Diagnosis not present

## 2016-04-28 DIAGNOSIS — R590 Localized enlarged lymph nodes: Secondary | ICD-10-CM | POA: Diagnosis not present

## 2016-05-01 DIAGNOSIS — L821 Other seborrheic keratosis: Secondary | ICD-10-CM | POA: Diagnosis not present

## 2016-05-01 DIAGNOSIS — L812 Freckles: Secondary | ICD-10-CM | POA: Diagnosis not present

## 2016-05-01 DIAGNOSIS — D485 Neoplasm of uncertain behavior of skin: Secondary | ICD-10-CM | POA: Diagnosis not present

## 2016-05-01 DIAGNOSIS — D2272 Melanocytic nevi of left lower limb, including hip: Secondary | ICD-10-CM | POA: Diagnosis not present

## 2016-05-01 DIAGNOSIS — Z85828 Personal history of other malignant neoplasm of skin: Secondary | ICD-10-CM | POA: Diagnosis not present

## 2016-05-01 DIAGNOSIS — Z85820 Personal history of malignant melanoma of skin: Secondary | ICD-10-CM | POA: Diagnosis not present

## 2016-05-01 DIAGNOSIS — D1801 Hemangioma of skin and subcutaneous tissue: Secondary | ICD-10-CM | POA: Diagnosis not present

## 2016-05-01 DIAGNOSIS — D2271 Melanocytic nevi of right lower limb, including hip: Secondary | ICD-10-CM | POA: Diagnosis not present

## 2016-05-01 DIAGNOSIS — D2261 Melanocytic nevi of right upper limb, including shoulder: Secondary | ICD-10-CM | POA: Diagnosis not present

## 2016-05-01 DIAGNOSIS — D2262 Melanocytic nevi of left upper limb, including shoulder: Secondary | ICD-10-CM | POA: Diagnosis not present

## 2016-05-01 DIAGNOSIS — L570 Actinic keratosis: Secondary | ICD-10-CM | POA: Diagnosis not present

## 2016-05-01 DIAGNOSIS — D0422 Carcinoma in situ of skin of left ear and external auricular canal: Secondary | ICD-10-CM | POA: Diagnosis not present

## 2016-05-05 ENCOUNTER — Telehealth: Payer: Self-pay | Admitting: General Practice

## 2016-05-05 NOTE — Telephone Encounter (Signed)
Patient dropped off a form to be signed today, 05/05/16.  Please call when form is ready.  Thanks!

## 2016-05-08 NOTE — Telephone Encounter (Signed)
Where are the forms located.. They are not in is box... Please advise so that we may better serve the patient's request..Marland Kitchen

## 2016-05-08 NOTE — Telephone Encounter (Signed)
On Dr. Reola Calkins desk.  I think.

## 2016-05-08 NOTE — Telephone Encounter (Signed)
done

## 2016-05-08 NOTE — Telephone Encounter (Signed)
Please advise 

## 2016-05-17 ENCOUNTER — Ambulatory Visit (INDEPENDENT_AMBULATORY_CARE_PROVIDER_SITE_OTHER): Payer: Medicare Other | Admitting: General Practice

## 2016-05-17 DIAGNOSIS — Z5181 Encounter for therapeutic drug level monitoring: Secondary | ICD-10-CM

## 2016-05-17 DIAGNOSIS — Z86718 Personal history of other venous thrombosis and embolism: Secondary | ICD-10-CM

## 2016-05-17 LAB — POCT INR: INR: 2.5

## 2016-05-17 NOTE — Patient Instructions (Signed)
Pre visit review using our clinic review tool, if applicable. No additional management support is needed unless otherwise documented below in the visit note. 

## 2016-05-18 ENCOUNTER — Telehealth: Payer: Self-pay | Admitting: Internal Medicine

## 2016-05-18 NOTE — Telephone Encounter (Signed)
Faxed, mailed, send copy to pt the letter to [employer]

## 2016-05-22 DIAGNOSIS — M47819 Spondylosis without myelopathy or radiculopathy, site unspecified: Secondary | ICD-10-CM | POA: Diagnosis not present

## 2016-05-22 DIAGNOSIS — C799 Secondary malignant neoplasm of unspecified site: Secondary | ICD-10-CM | POA: Diagnosis not present

## 2016-05-22 DIAGNOSIS — N400 Enlarged prostate without lower urinary tract symptoms: Secondary | ICD-10-CM | POA: Diagnosis not present

## 2016-05-22 DIAGNOSIS — M4800 Spinal stenosis, site unspecified: Secondary | ICD-10-CM | POA: Diagnosis not present

## 2016-05-22 DIAGNOSIS — R911 Solitary pulmonary nodule: Secondary | ICD-10-CM | POA: Diagnosis not present

## 2016-05-22 DIAGNOSIS — R590 Localized enlarged lymph nodes: Secondary | ICD-10-CM | POA: Diagnosis not present

## 2016-05-22 DIAGNOSIS — E890 Postprocedural hypothyroidism: Secondary | ICD-10-CM | POA: Diagnosis not present

## 2016-05-22 DIAGNOSIS — R932 Abnormal findings on diagnostic imaging of liver and biliary tract: Secondary | ICD-10-CM | POA: Diagnosis not present

## 2016-05-22 DIAGNOSIS — R918 Other nonspecific abnormal finding of lung field: Secondary | ICD-10-CM | POA: Diagnosis not present

## 2016-05-22 DIAGNOSIS — C73 Malignant neoplasm of thyroid gland: Secondary | ICD-10-CM | POA: Diagnosis not present

## 2016-05-22 DIAGNOSIS — N281 Cyst of kidney, acquired: Secondary | ICD-10-CM | POA: Diagnosis not present

## 2016-05-22 DIAGNOSIS — K7689 Other specified diseases of liver: Secondary | ICD-10-CM | POA: Diagnosis not present

## 2016-06-02 DIAGNOSIS — Z96652 Presence of left artificial knee joint: Secondary | ICD-10-CM | POA: Diagnosis not present

## 2016-06-02 DIAGNOSIS — Z471 Aftercare following joint replacement surgery: Secondary | ICD-10-CM | POA: Diagnosis not present

## 2016-06-02 DIAGNOSIS — M1711 Unilateral primary osteoarthritis, right knee: Secondary | ICD-10-CM | POA: Diagnosis not present

## 2016-06-11 ENCOUNTER — Other Ambulatory Visit: Payer: Self-pay | Admitting: Family

## 2016-06-14 ENCOUNTER — Ambulatory Visit (INDEPENDENT_AMBULATORY_CARE_PROVIDER_SITE_OTHER): Payer: Medicare Other | Admitting: General Practice

## 2016-06-14 DIAGNOSIS — Z5181 Encounter for therapeutic drug level monitoring: Secondary | ICD-10-CM | POA: Diagnosis not present

## 2016-06-14 DIAGNOSIS — Z86718 Personal history of other venous thrombosis and embolism: Secondary | ICD-10-CM

## 2016-06-14 LAB — POCT INR: INR: 1.9

## 2016-06-14 NOTE — Patient Instructions (Signed)
Pre visit review using our clinic review tool, if applicable. No additional management support is needed unless otherwise documented below in the visit note. 

## 2016-06-14 NOTE — Progress Notes (Signed)
I have reviewed and agree with the plan. 

## 2016-07-19 ENCOUNTER — Ambulatory Visit (INDEPENDENT_AMBULATORY_CARE_PROVIDER_SITE_OTHER): Payer: Medicare Other | Admitting: General Practice

## 2016-07-19 DIAGNOSIS — Z5181 Encounter for therapeutic drug level monitoring: Secondary | ICD-10-CM

## 2016-07-19 DIAGNOSIS — Z86718 Personal history of other venous thrombosis and embolism: Secondary | ICD-10-CM

## 2016-07-19 LAB — POCT INR: INR: 2.7

## 2016-07-19 NOTE — Patient Instructions (Signed)
Pre visit review using our clinic review tool, if applicable. No additional management support is needed unless otherwise documented below in the visit note. 

## 2016-07-19 NOTE — Progress Notes (Signed)
I have reviewed and agree with the plan. 

## 2016-07-28 DIAGNOSIS — R0602 Shortness of breath: Secondary | ICD-10-CM | POA: Diagnosis not present

## 2016-07-28 DIAGNOSIS — C770 Secondary and unspecified malignant neoplasm of lymph nodes of head, face and neck: Secondary | ICD-10-CM | POA: Diagnosis not present

## 2016-07-28 DIAGNOSIS — C73 Malignant neoplasm of thyroid gland: Secondary | ICD-10-CM | POA: Diagnosis not present

## 2016-07-28 DIAGNOSIS — R1310 Dysphagia, unspecified: Secondary | ICD-10-CM | POA: Diagnosis not present

## 2016-07-28 DIAGNOSIS — Z803 Family history of malignant neoplasm of breast: Secondary | ICD-10-CM | POA: Diagnosis not present

## 2016-07-28 DIAGNOSIS — Z85820 Personal history of malignant melanoma of skin: Secondary | ICD-10-CM | POA: Diagnosis not present

## 2016-07-28 DIAGNOSIS — Z7901 Long term (current) use of anticoagulants: Secondary | ICD-10-CM | POA: Diagnosis not present

## 2016-07-28 DIAGNOSIS — R5383 Other fatigue: Secondary | ICD-10-CM | POA: Diagnosis not present

## 2016-07-28 DIAGNOSIS — C7802 Secondary malignant neoplasm of left lung: Secondary | ICD-10-CM | POA: Diagnosis not present

## 2016-07-28 DIAGNOSIS — C7801 Secondary malignant neoplasm of right lung: Secondary | ICD-10-CM | POA: Diagnosis not present

## 2016-07-28 DIAGNOSIS — Z86711 Personal history of pulmonary embolism: Secondary | ICD-10-CM | POA: Diagnosis not present

## 2016-07-28 DIAGNOSIS — Z79899 Other long term (current) drug therapy: Secondary | ICD-10-CM | POA: Diagnosis not present

## 2016-07-28 DIAGNOSIS — Z86718 Personal history of other venous thrombosis and embolism: Secondary | ICD-10-CM | POA: Diagnosis not present

## 2016-07-28 DIAGNOSIS — R490 Dysphonia: Secondary | ICD-10-CM | POA: Diagnosis not present

## 2016-07-28 DIAGNOSIS — E039 Hypothyroidism, unspecified: Secondary | ICD-10-CM | POA: Diagnosis not present

## 2016-08-03 ENCOUNTER — Telehealth: Payer: Self-pay | Admitting: Internal Medicine

## 2016-08-03 DIAGNOSIS — Z85820 Personal history of malignant melanoma of skin: Secondary | ICD-10-CM | POA: Diagnosis not present

## 2016-08-03 DIAGNOSIS — L812 Freckles: Secondary | ICD-10-CM | POA: Diagnosis not present

## 2016-08-03 DIAGNOSIS — D1801 Hemangioma of skin and subcutaneous tissue: Secondary | ICD-10-CM | POA: Diagnosis not present

## 2016-08-03 DIAGNOSIS — Z85828 Personal history of other malignant neoplasm of skin: Secondary | ICD-10-CM | POA: Diagnosis not present

## 2016-08-03 DIAGNOSIS — L821 Other seborrheic keratosis: Secondary | ICD-10-CM | POA: Diagnosis not present

## 2016-08-03 DIAGNOSIS — L570 Actinic keratosis: Secondary | ICD-10-CM | POA: Diagnosis not present

## 2016-08-03 NOTE — Telephone Encounter (Signed)
MR is requesting pt to be seen on 08/17/2016 that was requested by Dr. Velora Heckler.   Called and spoke to pt. Appt made with MR on 08/17/2016. Pt verbalized understanding and denied any further questions or concerns at this time.

## 2016-08-17 ENCOUNTER — Institutional Professional Consult (permissible substitution): Payer: Medicare Other | Admitting: Internal Medicine

## 2016-08-23 ENCOUNTER — Ambulatory Visit (INDEPENDENT_AMBULATORY_CARE_PROVIDER_SITE_OTHER): Payer: Medicare Other | Admitting: General Practice

## 2016-08-23 DIAGNOSIS — Z5181 Encounter for therapeutic drug level monitoring: Secondary | ICD-10-CM | POA: Diagnosis not present

## 2016-08-23 DIAGNOSIS — Z86718 Personal history of other venous thrombosis and embolism: Secondary | ICD-10-CM

## 2016-08-23 LAB — POCT INR: INR: 2.4

## 2016-08-23 NOTE — Patient Instructions (Signed)
Pre visit review using our clinic review tool, if applicable. No additional management support is needed unless otherwise documented below in the visit note. 

## 2016-08-23 NOTE — Progress Notes (Signed)
I have reviewed and agree with the plan. 

## 2016-08-29 DIAGNOSIS — R0602 Shortness of breath: Secondary | ICD-10-CM | POA: Diagnosis not present

## 2016-08-29 DIAGNOSIS — R918 Other nonspecific abnormal finding of lung field: Secondary | ICD-10-CM | POA: Diagnosis not present

## 2016-09-20 ENCOUNTER — Ambulatory Visit (INDEPENDENT_AMBULATORY_CARE_PROVIDER_SITE_OTHER): Payer: Medicare Other | Admitting: General Practice

## 2016-09-20 DIAGNOSIS — Z86718 Personal history of other venous thrombosis and embolism: Secondary | ICD-10-CM

## 2016-09-20 DIAGNOSIS — Z5181 Encounter for therapeutic drug level monitoring: Secondary | ICD-10-CM

## 2016-09-20 NOTE — Patient Instructions (Signed)
Pre visit review using our clinic review tool, if applicable. No additional management support is needed unless otherwise documented below in the visit note. 

## 2016-09-21 NOTE — Patient Instructions (Signed)
Pre visit review using our clinic review tool, if applicable. No additional management support is needed unless otherwise documented below in the visit note. 

## 2016-09-22 ENCOUNTER — Ambulatory Visit (INDEPENDENT_AMBULATORY_CARE_PROVIDER_SITE_OTHER): Payer: Medicare Other | Admitting: General Practice

## 2016-09-22 DIAGNOSIS — Z5181 Encounter for therapeutic drug level monitoring: Secondary | ICD-10-CM | POA: Diagnosis not present

## 2016-09-22 DIAGNOSIS — Z86718 Personal history of other venous thrombosis and embolism: Secondary | ICD-10-CM | POA: Diagnosis not present

## 2016-09-22 LAB — POCT INR: INR: 2.5

## 2016-10-19 NOTE — Patient Instructions (Signed)
Pre visit review using our clinic review tool, if applicable. No additional management support is needed unless otherwise documented below in the visit note. 

## 2016-10-20 ENCOUNTER — Ambulatory Visit (INDEPENDENT_AMBULATORY_CARE_PROVIDER_SITE_OTHER): Payer: Medicare Other | Admitting: General Practice

## 2016-10-20 DIAGNOSIS — Z5181 Encounter for therapeutic drug level monitoring: Secondary | ICD-10-CM

## 2016-10-20 DIAGNOSIS — Z86718 Personal history of other venous thrombosis and embolism: Secondary | ICD-10-CM

## 2016-10-25 ENCOUNTER — Ambulatory Visit (INDEPENDENT_AMBULATORY_CARE_PROVIDER_SITE_OTHER): Payer: Medicare Other | Admitting: General Practice

## 2016-10-25 DIAGNOSIS — Z7901 Long term (current) use of anticoagulants: Secondary | ICD-10-CM | POA: Diagnosis not present

## 2016-10-25 DIAGNOSIS — Z86718 Personal history of other venous thrombosis and embolism: Secondary | ICD-10-CM | POA: Diagnosis not present

## 2016-10-25 DIAGNOSIS — Z5181 Encounter for therapeutic drug level monitoring: Secondary | ICD-10-CM

## 2016-10-25 LAB — POCT INR: INR: 2.1

## 2016-10-25 NOTE — Progress Notes (Signed)
I have reviewed and agree with the plan. 

## 2016-10-25 NOTE — Patient Instructions (Signed)
Pre visit review using our clinic review tool, if applicable. No additional management support is needed unless otherwise documented below in the visit note. 

## 2016-11-03 DIAGNOSIS — C439 Malignant melanoma of skin, unspecified: Secondary | ICD-10-CM | POA: Diagnosis not present

## 2016-11-03 DIAGNOSIS — R911 Solitary pulmonary nodule: Secondary | ICD-10-CM | POA: Diagnosis not present

## 2016-11-03 DIAGNOSIS — Z96659 Presence of unspecified artificial knee joint: Secondary | ICD-10-CM | POA: Diagnosis not present

## 2016-11-03 DIAGNOSIS — N289 Disorder of kidney and ureter, unspecified: Secondary | ICD-10-CM | POA: Diagnosis not present

## 2016-11-03 DIAGNOSIS — M79661 Pain in right lower leg: Secondary | ICD-10-CM | POA: Diagnosis not present

## 2016-11-03 DIAGNOSIS — Z86718 Personal history of other venous thrombosis and embolism: Secondary | ICD-10-CM | POA: Diagnosis not present

## 2016-11-03 DIAGNOSIS — Z85850 Personal history of malignant neoplasm of thyroid: Secondary | ICD-10-CM | POA: Diagnosis not present

## 2016-11-03 DIAGNOSIS — C7802 Secondary malignant neoplasm of left lung: Secondary | ICD-10-CM | POA: Diagnosis not present

## 2016-11-03 DIAGNOSIS — Z79899 Other long term (current) drug therapy: Secondary | ICD-10-CM | POA: Diagnosis not present

## 2016-11-03 DIAGNOSIS — M79662 Pain in left lower leg: Secondary | ICD-10-CM | POA: Diagnosis not present

## 2016-11-03 DIAGNOSIS — Z7901 Long term (current) use of anticoagulants: Secondary | ICD-10-CM | POA: Diagnosis not present

## 2016-11-03 DIAGNOSIS — Z803 Family history of malignant neoplasm of breast: Secondary | ICD-10-CM | POA: Diagnosis not present

## 2016-11-03 DIAGNOSIS — Z9889 Other specified postprocedural states: Secondary | ICD-10-CM | POA: Diagnosis not present

## 2016-11-03 DIAGNOSIS — C7801 Secondary malignant neoplasm of right lung: Secondary | ICD-10-CM | POA: Diagnosis not present

## 2016-11-03 DIAGNOSIS — K769 Liver disease, unspecified: Secondary | ICD-10-CM | POA: Diagnosis not present

## 2016-11-03 DIAGNOSIS — R590 Localized enlarged lymph nodes: Secondary | ICD-10-CM | POA: Diagnosis not present

## 2016-11-03 DIAGNOSIS — C73 Malignant neoplasm of thyroid gland: Secondary | ICD-10-CM | POA: Diagnosis not present

## 2016-11-03 DIAGNOSIS — R490 Dysphonia: Secondary | ICD-10-CM | POA: Diagnosis not present

## 2016-11-03 DIAGNOSIS — R5383 Other fatigue: Secondary | ICD-10-CM | POA: Diagnosis not present

## 2016-11-03 DIAGNOSIS — M542 Cervicalgia: Secondary | ICD-10-CM | POA: Diagnosis not present

## 2016-11-03 DIAGNOSIS — R918 Other nonspecific abnormal finding of lung field: Secondary | ICD-10-CM | POA: Diagnosis not present

## 2016-11-06 DIAGNOSIS — D485 Neoplasm of uncertain behavior of skin: Secondary | ICD-10-CM | POA: Diagnosis not present

## 2016-11-06 DIAGNOSIS — L812 Freckles: Secondary | ICD-10-CM | POA: Diagnosis not present

## 2016-11-06 DIAGNOSIS — L821 Other seborrheic keratosis: Secondary | ICD-10-CM | POA: Diagnosis not present

## 2016-11-06 DIAGNOSIS — D1801 Hemangioma of skin and subcutaneous tissue: Secondary | ICD-10-CM | POA: Diagnosis not present

## 2016-11-06 DIAGNOSIS — C44629 Squamous cell carcinoma of skin of left upper limb, including shoulder: Secondary | ICD-10-CM | POA: Diagnosis not present

## 2016-11-06 DIAGNOSIS — Z85828 Personal history of other malignant neoplasm of skin: Secondary | ICD-10-CM | POA: Diagnosis not present

## 2016-11-06 DIAGNOSIS — C4359 Malignant melanoma of other part of trunk: Secondary | ICD-10-CM | POA: Diagnosis not present

## 2016-11-06 DIAGNOSIS — Z85820 Personal history of malignant melanoma of skin: Secondary | ICD-10-CM | POA: Diagnosis not present

## 2016-11-15 DIAGNOSIS — Z125 Encounter for screening for malignant neoplasm of prostate: Secondary | ICD-10-CM | POA: Diagnosis not present

## 2016-11-22 ENCOUNTER — Ambulatory Visit (INDEPENDENT_AMBULATORY_CARE_PROVIDER_SITE_OTHER): Payer: Medicare Other | Admitting: General Practice

## 2016-11-22 DIAGNOSIS — Z86718 Personal history of other venous thrombosis and embolism: Secondary | ICD-10-CM

## 2016-11-22 DIAGNOSIS — R3912 Poor urinary stream: Secondary | ICD-10-CM | POA: Diagnosis not present

## 2016-11-22 DIAGNOSIS — N401 Enlarged prostate with lower urinary tract symptoms: Secondary | ICD-10-CM | POA: Diagnosis not present

## 2016-11-22 DIAGNOSIS — Z7901 Long term (current) use of anticoagulants: Secondary | ICD-10-CM | POA: Diagnosis not present

## 2016-11-22 DIAGNOSIS — R972 Elevated prostate specific antigen [PSA]: Secondary | ICD-10-CM | POA: Diagnosis not present

## 2016-11-22 LAB — POCT INR: INR: 2.2

## 2016-11-22 NOTE — Patient Instructions (Signed)
Pre visit review using our clinic review tool, if applicable. No additional management support is needed unless otherwise documented below in the visit note. 

## 2016-11-27 DIAGNOSIS — Z23 Encounter for immunization: Secondary | ICD-10-CM | POA: Diagnosis not present

## 2016-12-05 DIAGNOSIS — Z85828 Personal history of other malignant neoplasm of skin: Secondary | ICD-10-CM | POA: Diagnosis not present

## 2016-12-05 DIAGNOSIS — C44519 Basal cell carcinoma of skin of other part of trunk: Secondary | ICD-10-CM | POA: Diagnosis not present

## 2016-12-05 DIAGNOSIS — Z85820 Personal history of malignant melanoma of skin: Secondary | ICD-10-CM | POA: Diagnosis not present

## 2016-12-05 DIAGNOSIS — C4359 Malignant melanoma of other part of trunk: Secondary | ICD-10-CM | POA: Diagnosis not present

## 2016-12-05 DIAGNOSIS — D225 Melanocytic nevi of trunk: Secondary | ICD-10-CM | POA: Diagnosis not present

## 2016-12-15 ENCOUNTER — Other Ambulatory Visit: Payer: Self-pay | Admitting: *Deleted

## 2016-12-15 MED ORDER — WARFARIN SODIUM 5 MG PO TABS: ORAL_TABLET | ORAL | 1 refills | Status: DC

## 2016-12-15 NOTE — Telephone Encounter (Signed)
Noted  

## 2016-12-15 NOTE — Telephone Encounter (Signed)
Rec'd fax pt requesting refill on his warfarin. Forwarding msg to cindy, RN w/coumadin clinic...Clarence Hopkins

## 2017-01-03 ENCOUNTER — Ambulatory Visit: Payer: Medicare Other

## 2017-01-09 ENCOUNTER — Ambulatory Visit (INDEPENDENT_AMBULATORY_CARE_PROVIDER_SITE_OTHER): Payer: Medicare Other | Admitting: General Practice

## 2017-01-09 DIAGNOSIS — Z86718 Personal history of other venous thrombosis and embolism: Secondary | ICD-10-CM | POA: Diagnosis not present

## 2017-01-09 DIAGNOSIS — Z7901 Long term (current) use of anticoagulants: Secondary | ICD-10-CM

## 2017-01-09 LAB — POCT INR: INR: 2.1

## 2017-01-09 NOTE — Patient Instructions (Addendum)
Pre visit review using our clinic review tool, if applicable. No additional management support is needed unless otherwise documented below in the visit note.  Continue to take 1 (5 mg) tablet daily.  Re-check in 4 weeks.

## 2017-01-14 DIAGNOSIS — J209 Acute bronchitis, unspecified: Secondary | ICD-10-CM | POA: Diagnosis not present

## 2017-01-14 DIAGNOSIS — H1031 Unspecified acute conjunctivitis, right eye: Secondary | ICD-10-CM | POA: Diagnosis not present

## 2017-01-19 DIAGNOSIS — H04123 Dry eye syndrome of bilateral lacrimal glands: Secondary | ICD-10-CM | POA: Diagnosis not present

## 2017-01-19 DIAGNOSIS — H26493 Other secondary cataract, bilateral: Secondary | ICD-10-CM | POA: Diagnosis not present

## 2017-01-19 DIAGNOSIS — Z961 Presence of intraocular lens: Secondary | ICD-10-CM | POA: Diagnosis not present

## 2017-01-19 DIAGNOSIS — H524 Presbyopia: Secondary | ICD-10-CM | POA: Diagnosis not present

## 2017-01-29 DIAGNOSIS — Z85820 Personal history of malignant melanoma of skin: Secondary | ICD-10-CM | POA: Diagnosis not present

## 2017-01-29 DIAGNOSIS — D1801 Hemangioma of skin and subcutaneous tissue: Secondary | ICD-10-CM | POA: Diagnosis not present

## 2017-01-29 DIAGNOSIS — L858 Other specified epidermal thickening: Secondary | ICD-10-CM | POA: Diagnosis not present

## 2017-01-29 DIAGNOSIS — L853 Xerosis cutis: Secondary | ICD-10-CM | POA: Diagnosis not present

## 2017-01-29 DIAGNOSIS — L821 Other seborrheic keratosis: Secondary | ICD-10-CM | POA: Diagnosis not present

## 2017-01-29 DIAGNOSIS — L820 Inflamed seborrheic keratosis: Secondary | ICD-10-CM | POA: Diagnosis not present

## 2017-01-29 DIAGNOSIS — L812 Freckles: Secondary | ICD-10-CM | POA: Diagnosis not present

## 2017-01-29 DIAGNOSIS — D485 Neoplasm of uncertain behavior of skin: Secondary | ICD-10-CM | POA: Diagnosis not present

## 2017-01-29 DIAGNOSIS — Z85828 Personal history of other malignant neoplasm of skin: Secondary | ICD-10-CM | POA: Diagnosis not present

## 2017-02-07 ENCOUNTER — Ambulatory Visit: Payer: Medicare Other

## 2017-02-09 ENCOUNTER — Ambulatory Visit: Payer: Medicare Other | Admitting: General Practice

## 2017-02-09 DIAGNOSIS — Z7901 Long term (current) use of anticoagulants: Secondary | ICD-10-CM

## 2017-02-09 DIAGNOSIS — Z86718 Personal history of other venous thrombosis and embolism: Secondary | ICD-10-CM

## 2017-02-09 LAB — POCT INR: INR: 2.4

## 2017-02-09 NOTE — Patient Instructions (Signed)
Pre visit review using our clinic review tool, if applicable. No additional management support is needed unless otherwise documented below in the visit note.  Continue to take 1 (5 mg) tablet daily.  Re-check in 4 weeks.

## 2017-02-16 DIAGNOSIS — C44319 Basal cell carcinoma of skin of other parts of face: Secondary | ICD-10-CM | POA: Diagnosis not present

## 2017-02-16 DIAGNOSIS — Z85820 Personal history of malignant melanoma of skin: Secondary | ICD-10-CM | POA: Diagnosis not present

## 2017-02-16 DIAGNOSIS — C4402 Squamous cell carcinoma of skin of lip: Secondary | ICD-10-CM | POA: Diagnosis not present

## 2017-02-16 DIAGNOSIS — Z85828 Personal history of other malignant neoplasm of skin: Secondary | ICD-10-CM | POA: Diagnosis not present

## 2017-02-16 DIAGNOSIS — C4401 Basal cell carcinoma of skin of lip: Secondary | ICD-10-CM | POA: Diagnosis not present

## 2017-02-22 DIAGNOSIS — C441122 Basal cell carcinoma of skin of right lower eyelid, including canthus: Secondary | ICD-10-CM | POA: Diagnosis not present

## 2017-02-22 DIAGNOSIS — D492 Neoplasm of unspecified behavior of bone, soft tissue, and skin: Secondary | ICD-10-CM | POA: Diagnosis not present

## 2017-02-23 DIAGNOSIS — R918 Other nonspecific abnormal finding of lung field: Secondary | ICD-10-CM | POA: Diagnosis not present

## 2017-02-23 DIAGNOSIS — C73 Malignant neoplasm of thyroid gland: Secondary | ICD-10-CM | POA: Diagnosis not present

## 2017-02-23 DIAGNOSIS — R0602 Shortness of breath: Secondary | ICD-10-CM | POA: Diagnosis not present

## 2017-02-23 DIAGNOSIS — C760 Malignant neoplasm of head, face and neck: Secondary | ICD-10-CM | POA: Diagnosis not present

## 2017-03-02 DIAGNOSIS — C73 Malignant neoplasm of thyroid gland: Secondary | ICD-10-CM | POA: Diagnosis not present

## 2017-03-02 DIAGNOSIS — E039 Hypothyroidism, unspecified: Secondary | ICD-10-CM | POA: Diagnosis not present

## 2017-03-02 DIAGNOSIS — I899 Noninfective disorder of lymphatic vessels and lymph nodes, unspecified: Secondary | ICD-10-CM | POA: Diagnosis not present

## 2017-03-02 DIAGNOSIS — C7800 Secondary malignant neoplasm of unspecified lung: Secondary | ICD-10-CM | POA: Diagnosis not present

## 2017-03-05 ENCOUNTER — Ambulatory Visit (INDEPENDENT_AMBULATORY_CARE_PROVIDER_SITE_OTHER): Payer: Medicare Other | Admitting: *Deleted

## 2017-03-05 ENCOUNTER — Ambulatory Visit (INDEPENDENT_AMBULATORY_CARE_PROVIDER_SITE_OTHER): Payer: Medicare Other | Admitting: Internal Medicine

## 2017-03-05 ENCOUNTER — Encounter: Payer: Self-pay | Admitting: Internal Medicine

## 2017-03-05 ENCOUNTER — Telehealth: Payer: Self-pay | Admitting: *Deleted

## 2017-03-05 DIAGNOSIS — Z7901 Long term (current) use of anticoagulants: Secondary | ICD-10-CM

## 2017-03-05 DIAGNOSIS — Z85828 Personal history of other malignant neoplasm of skin: Secondary | ICD-10-CM

## 2017-03-05 DIAGNOSIS — E781 Pure hyperglyceridemia: Secondary | ICD-10-CM

## 2017-03-05 DIAGNOSIS — E890 Postprocedural hypothyroidism: Secondary | ICD-10-CM | POA: Diagnosis not present

## 2017-03-05 DIAGNOSIS — Z23 Encounter for immunization: Secondary | ICD-10-CM | POA: Diagnosis not present

## 2017-03-05 DIAGNOSIS — D6859 Other primary thrombophilia: Secondary | ICD-10-CM | POA: Diagnosis not present

## 2017-03-05 DIAGNOSIS — Z0000 Encounter for general adult medical examination without abnormal findings: Secondary | ICD-10-CM

## 2017-03-05 MED ORDER — ZOSTER VAC RECOMB ADJUVANTED 50 MCG/0.5ML IM SUSR: 0.5000 mL | Freq: Once | INTRAMUSCULAR | 1 refills | Status: AC

## 2017-03-05 MED ORDER — B COMPLEX PO TABS: 1.0000 | ORAL_TABLET | Freq: Every day | ORAL | 3 refills | Status: DC

## 2017-03-05 MED ORDER — VITAMIN D 1000 UNITS PO TABS: 1000.0000 [IU] | ORAL_TABLET | Freq: Every day | ORAL | 11 refills | Status: AC

## 2017-03-05 NOTE — Progress Notes (Signed)
Subjective:  Patient ID: Clarence Hopkins, male    DOB: 03/30/1930  Age: 84 y.o. MRN: [id number]  CC: No chief complaint on file.   HPI Clarence Hopkins presents for BPH, hypothyroidism, coumadin Rx f/u  Outpatient Medications Prior to Visit  Medication Sig Dispense Refill  . tamsulosin (FLOMAX) 0.4 MG CAPS capsule TAKE 1 CAPSULE BY MOUTH DAILY AFTER SUPPER 90 capsule 2  . warfarin (COUMADIN) 5 MG tablet Take as directed by anticoagulation clinic 105 tablet 1  . levothyroxine (SYNTHROID, LEVOTHROID) 175 MCG tablet Take 1 tablet by mouth daily.    Francia Greaves THYROID 120 MG tablet TAKE 1 TABLET BY MOUTH EVERY DAY BEFORE BREAKFAST 30 tablet 0   No facility-administered medications prior to visit.     ROS Review of Systems  Constitutional: Negative for appetite change, fatigue and unexpected weight change.  HENT: Negative for congestion, nosebleeds, sneezing, sore throat and trouble swallowing.   Eyes: Negative for itching and visual disturbance.  Respiratory: Negative for cough.   Cardiovascular: Negative for chest pain, palpitations and leg swelling.  Gastrointestinal: Negative for abdominal distention, blood in stool, diarrhea and nausea.  Genitourinary: Negative for frequency and hematuria.  Musculoskeletal: Negative for back pain, gait problem, joint swelling and neck pain.  Skin: Positive for color change. Negative for rash.  Neurological: Negative for dizziness, tremors, speech difficulty and weakness.  Psychiatric/Behavioral: Negative for agitation, dysphoric mood and sleep disturbance. The patient is not nervous/anxious.     Objective:  BP 128/82 (BP Location: Left Arm, Patient Position: Sitting, Cuff Size: Large)   Pulse 63   Temp 97.6 F (36.4 C) (Oral)   Ht 6\' 4"  (1.93 m)   Wt 207 lb (93.9 kg)   SpO2 99%   BMI 25.20 kg/m   BP Readings from Last 3 Encounters:  03/05/17 128/82  02/04/16 138/82  05/24/15 140/80    Wt Readings from Last 3 Encounters:  03/05/17 207 lb  (93.9 kg)  02/04/16 208 lb (94.3 kg)  05/24/15 193 lb 9.6 oz (87.8 kg)    Physical Exam  Constitutional: He is oriented to person, place, and time. He appears well-developed. No distress.  NAD  HENT:  Mouth/Throat: Oropharynx is clear and moist.  Eyes: Conjunctivae are normal. Pupils are equal, round, and reactive to light.  Neck: Normal range of motion. No JVD present. No thyromegaly present.  Cardiovascular: Normal rate, regular rhythm, normal heart sounds and intact distal pulses. Exam reveals no gallop and no friction rub.  No murmur heard. Pulmonary/Chest: Effort normal and breath sounds normal. No respiratory distress. He has no wheezes. He has no rales. He exhibits no tenderness.  Abdominal: Soft. Bowel sounds are normal. He exhibits no distension and no mass. There is no tenderness. There is no rebound and no guarding.  Musculoskeletal: Normal range of motion. He exhibits no edema or tenderness.  Lymphadenopathy:    He has no cervical adenopathy.  Neurological: He is alert and oriented to person, place, and time. He has normal reflexes. No cranial nerve deficit. He exhibits normal muscle tone. He displays a negative Romberg sign. Coordination and gait normal.  Skin: Skin is warm and dry. No rash noted.  Psychiatric: He has a normal mood and affect. His behavior is normal. Judgment and thought content normal.  healing scar R lower eyelid  Lab Results  Component Value Date   WBC 5.2 02/04/2016   HGB 14.0 02/04/2016   HCT 42.4 02/04/2016   PLT 202.0 02/04/2016   GLUCOSE  92 02/04/2016   CHOL 147 02/04/2016   TRIG 79.0 02/04/2016   HDL 41.20 02/04/2016   LDLDIRECT 116.0 03/31/2013   LDLCALC 90 02/04/2016   ALT 17 02/04/2016   AST 21 02/04/2016   NA 143 02/04/2016   K 4.8 02/04/2016   CL 105 02/04/2016   CREATININE 1.12 02/04/2016   BUN 16 02/04/2016   CO2 28 02/04/2016   TSH 1.52 02/02/2015   PSA 6.44 (H) 02/04/2016   INR 2.4 02/09/2017    No results  found.  Assessment & Plan:    Follow-up: No Follow-up on file.  Walker Kehr, MD

## 2017-03-05 NOTE — Assessment & Plan Note (Signed)
R lower eyelid

## 2017-03-05 NOTE — Assessment & Plan Note (Signed)
labs

## 2017-03-05 NOTE — Patient Instructions (Signed)
Continue doing brain stimulating activities (puzzles, reading, adult coloring books, staying active) to keep memory sharp.   Continue to eat heart healthy diet (full of fruits, vegetables, whole grains, lean protein, water--limit salt, fat, and sugar intake) and increase physical activity as tolerated.   [name] , Thank you for taking time to come for your Medicare Wellness Visit. I appreciate your ongoing commitment to your health goals. Please review the following plan we discussed and let me know if I can assist you in the future.   These are the goals we discussed: Goals    . Patient Stated     Maintain current health status, continue to exercise, eat healthy, enjoy life and family.        This is a list of the screening recommended for you and due dates:  Health Maintenance  Topic Date Due  . Tetanus Vaccine  09/22/1949  . Pneumonia vaccines (2 of 2 - PCV13) 02/03/2017  . Flu Shot  Completed

## 2017-03-05 NOTE — Assessment & Plan Note (Signed)
Coumadin 

## 2017-03-05 NOTE — Progress Notes (Addendum)
Subjective:   Clarence Hopkins is a 84 y.o. male who presents for Medicare Annual/Subsequent preventive examination.  Review of Systems:  No ROS.  Medicare Wellness Visit. Additional risk factors are reflected in the social history.  Cardiac Risk Factors include: advanced age (>9men, >5 women);male gender Sleep patterns: has interrupted sleep, gets up 1-2 times nightly to void and sleeps 6-7 hours nightly.   Home Safety/Smoke Alarms: Feels safe in home. Smoke alarms in place.  Living environment; residence and Firearm Safety: 2-story house, no firearms. Lives with wife, no needs for DME, good support system Seat Belt Safety/Bike Helmet: Wears seat belt.   Male:   CCS- N/A    PSA-  Lab Results  Component Value Date   PSA 6.44 (H) 02/04/2016   PSA 4.38 (H) 12/22/2013   PSA 4.32 (H) 03/31/2013     Objective:    Vitals: There were no vitals taken for this visit.  There is no height or weight on file to calculate BMI.  Advanced Directives 03/05/2017 04/16/2013  Does Patient Have a Medical Advance Directive? Yes Patient has advance directive, copy not in chart  Type of Advance Directive [place];Living will Living will;Healthcare Power of Attorney  Does patient want to make changes to medical advance directive? - No change requested  Copy of [place] in Chart? No - copy requested -    Tobacco Social History   Tobacco Use  Smoking Status Former Smoker  . Packs/day: 0.75  . Years: 41.00  . Pack years: 30.75  . Types: Cigarettes  . Last attempt to quit: 02/13/1993  . Years since quitting: 24.0  Smokeless Tobacco Never Used  Tobacco Comment   smoked 1956-1995, up to 1/2 ppd     Counseling given: Not Answered Comment: smoked 1956-1995, up to 1/2 ppd  Past Medical History:  Diagnosis Date  . Arthritis   . Cancer ([place]) 2003   melanoma  L ear  . Hypothyroidism    post op  . Pulmonary embolism ([place]) 2005 & 2007   protein C deficiency     Past Surgical History:  Procedure Laterality Date  . CERVICAL FUSION      X 2; Dr Vertell Limber  . COLONOSCOPY    . FINGER ARTHROPLASTY Right 04/17/2013   Procedure: IMPLANT ARTHROPLASTY RIGHT INDEX;  Surgeon: Cammie Sickle., MD;  Location: [facility];  Service: Orthopedics;  Laterality: Right;  . LUMBAR LAMINECTOMY  2010   Dr Becky Sax  . MELANOMA EXCISION  2003   L ear  . THYROIDECTOMY  1971   cytology indefinite  . TONSILLECTOMY    . TOTAL KNEE ARTHROPLASTY  2004   left   Family History  Problem Relation Age of Onset  . Breast cancer Mother   . Alzheimer's disease Father   . Breast cancer Sister   . Breast cancer Maternal Grandmother   . Diabetes Neg Hx   . Stroke Neg Hx   . Heart disease Neg Hx    Social History   Socioeconomic History  . Marital status: Married    Spouse name: Not on file  . Number of children: 4  . Years of education: 51  . Highest education level: Not on file  Social Needs  . Financial resource strain: Not on file  . Food insecurity - worry: Not on file  . Food insecurity - inability: Not on file  . Transportation needs - medical: Not on file  . Transportation needs -  non-medical: Not on file  Occupational History  . Not on file  Tobacco Use  . Smoking status: Former Smoker    Packs/day: 0.75    Years: 41.00    Pack years: 30.75    Types: Cigarettes    Last attempt to quit: 02/13/1993    Years since quitting: 24.0  . Smokeless tobacco: Never Used  . Tobacco comment: smoked 1956-1995, up to 1/2 ppd  Substance and Sexual Activity  . Alcohol use: Yes    Comment:  Vodka 2 oz/ night  . Drug use: No  . Sexual activity: Not on file  Other Topics Concern  . Not on file  Social History Narrative   Fun: Plays golf   Denies abuse and feels safe at home    Outpatient Encounter Medications as of 03/05/2017  Medication Sig  . levothyroxine (SYNTHROID, LEVOTHROID) 175 MCG tablet Take 1 tablet by mouth daily.  . tamsulosin (FLOMAX)  0.4 MG CAPS capsule TAKE 1 CAPSULE BY MOUTH DAILY AFTER SUPPER  . warfarin (COUMADIN) 5 MG tablet Take as directed by anticoagulation clinic  . [DISCONTINUED] ARMOUR THYROID 120 MG tablet TAKE 1 TABLET BY MOUTH EVERY DAY BEFORE BREAKFAST   No facility-administered encounter medications on file as of 03/05/2017.     Activities of Daily Living In your present state of health, do you have any difficulty performing the following activities: 03/05/2017  Hearing? N  Vision? N  Difficulty concentrating or making decisions? N  Walking or climbing stairs? N  Dressing or bathing? N  Doing errands, shopping? N  Preparing Food and eating ? N  Using the Toilet? N  In the past six months, have you accidently leaked urine? N  Do you have problems with loss of bowel control? N  Managing your Medications? N  Managing your Finances? N  Housekeeping or managing your Housekeeping? N  Some recent data might be hidden    Patient Care Team: Plotnikov, Evie Lacks, MD as PCP - General (Internal Medicine) Shon Hough, MD as Consulting Physician (Ophthalmology) [name], MD as Consulting Physician (Dermatology)   Assessment:   This is a routine wellness examination for [name]. Physical assessment deferred to PCP.   Exercise Activities and Dietary recommendations Current Exercise Habits: Structured exercise class, Time (Minutes): 40, Frequency (Times/Week): 5, Weekly Exercise (Minutes/Week): 200, Intensity: Mild, Exercise limited by: None identified  Diet (meal preparation, eat out, water intake, caffeinated beverages, dairy products, fruits and vegetables): in general, a "healthy" diet  , well balanced, eats a variety of fruits and vegetables daily, limits salt, fat/cholesterol, sugar, caffeine, drinks 6-8 glasses of water daily.  Goals    . Patient Stated     Maintain current health status, continue to exercise, eat healthy, enjoy life and family.        Fall Risk Fall Risk  03/05/2017  02/04/2016 02/02/2015  Falls in the past year? No No No    Depression Screen PHQ 2/9 Scores 03/05/2017 02/04/2016 02/02/2015  PHQ - 2 Score 0 0 0  PHQ- 9 Score 3 - -    Cognitive Function MMSE - Mini Mental State Exam 03/05/2017  Orientation to time 5  Orientation to Place 5  Registration 3  Attention/ Calculation 5  Recall 1  Language- name 2 objects 2  Language- repeat 1  Language- follow 3 step command 3  Language- read & follow direction 1  Write a sentence 1  Copy design 1  Total score 28  Immunization History  Administered Date(s) Administered  . Influenza Split 11/13/2012  . Influenza Whole 11/13/2009, 11/14/2011  . Influenza, High Dose Seasonal PF 11/23/2015  . Influenza-Unspecified 12/08/2013, 12/07/2014  . Pneumococcal Conjugate-13 03/05/2017  . Pneumococcal Polysaccharide-23 02/04/2016  . Zoster 03/14/2006   Screening Tests Health Maintenance  Topic Date Due  . TETANUS/TDAP  09/22/1949  . INFLUENZA VACCINE  Completed  . PNA vac Low Risk Adult  Completed      Plan:    Continue doing brain stimulating activities (puzzles, reading, adult coloring books, staying active) to keep memory sharp.   Continue to eat heart healthy diet (full of fruits, vegetables, whole grains, lean protein, water--limit salt, fat, and sugar intake) and increase physical activity as tolerated.  I have personally reviewed and noted the following in the patient's chart:   . Medical and social history . Use of alcohol, tobacco or illicit drugs  . Current medications and supplements . Functional ability and status . Nutritional status . Physical activity . Advanced directives . List of other physicians . Vitals . Screenings to include cognitive, depression, and falls . Referrals and appointments  In addition, I have reviewed and discussed with patient certain preventive protocols, quality metrics, and best practice recommendations. A written personalized care plan for  preventive services as well as general preventive health recommendations were provided to patient.     Michiel Cowboy, RN  03/05/2017  Medical screening examination/treatment/procedure(s) were performed by non-physician practitioner and as supervising physician I was immediately available for consultation/collaboration. I agree with above. Lew Dawes, MD

## 2017-03-05 NOTE — Assessment & Plan Note (Addendum)
Levothroid 

## 2017-03-05 NOTE — Assessment & Plan Note (Signed)
On Coumadin 

## 2017-03-05 NOTE — Telephone Encounter (Signed)
Called to ask patient about doing AWV after PCP visit today. Patient states he will see nurse for AWV.

## 2017-03-06 ENCOUNTER — Ambulatory Visit (INDEPENDENT_AMBULATORY_CARE_PROVIDER_SITE_OTHER): Payer: Medicare Other | Admitting: General Practice

## 2017-03-06 ENCOUNTER — Other Ambulatory Visit (INDEPENDENT_AMBULATORY_CARE_PROVIDER_SITE_OTHER): Payer: Medicare Other

## 2017-03-06 DIAGNOSIS — E559 Vitamin D deficiency, unspecified: Secondary | ICD-10-CM

## 2017-03-06 DIAGNOSIS — Z01818 Encounter for other preprocedural examination: Secondary | ICD-10-CM

## 2017-03-06 DIAGNOSIS — E890 Postprocedural hypothyroidism: Secondary | ICD-10-CM

## 2017-03-06 DIAGNOSIS — E781 Pure hyperglyceridemia: Secondary | ICD-10-CM

## 2017-03-06 DIAGNOSIS — Z86718 Personal history of other venous thrombosis and embolism: Secondary | ICD-10-CM | POA: Diagnosis not present

## 2017-03-06 DIAGNOSIS — Z7901 Long term (current) use of anticoagulants: Secondary | ICD-10-CM | POA: Diagnosis not present

## 2017-03-06 DIAGNOSIS — D6859 Other primary thrombophilia: Secondary | ICD-10-CM

## 2017-03-06 LAB — BASIC METABOLIC PANEL
BUN: 15 mg/dL (ref 6–23)
CALCIUM: 9 mg/dL (ref 8.4–10.5)
CHLORIDE: 103 meq/L (ref 96–112)
CO2: 31 mEq/L (ref 19–32)
CREATININE: 0.94 mg/dL (ref 0.40–1.50)
GFR: 81.17 mL/min (ref >60.00)
GLUCOSE: 102 mg/dL — AB (ref 70–99)
Potassium: 4.6 mEq/L (ref 3.5–5.1)
Sodium: 139 mEq/L (ref 135–145)

## 2017-03-06 LAB — CBC WITH DIFFERENTIAL/PLATELET
Basophils Absolute: 0 10*3/uL (ref 0.0–0.1)
Basophils Relative: 0.4 % (ref 0.0–3.0)
EOS ABS: 0.1 10*3/uL (ref 0.0–0.7)
Eosinophils Relative: 2.5 % (ref 0.0–5.0)
HEMATOCRIT: 41.7 % (ref 39.0–52.0)
Hemoglobin: 13.7 g/dL (ref 13.0–17.0)
LYMPHS ABS: 1.4 10*3/uL (ref 0.7–4.0)
LYMPHS PCT: 27.9 % (ref 12.0–46.0)
MCHC: 32.8 g/dL (ref 30.0–36.0)
MCV: 86.7 fl (ref 78.0–100.0)
MONOS PCT: 8.8 % (ref 3.0–12.0)
Monocytes Absolute: 0.4 10*3/uL (ref 0.1–1.0)
NEUTROS ABS: 3.1 10*3/uL (ref 1.4–7.7)
NEUTROS PCT: 60.4 % (ref 43.0–77.0)
PLATELETS: 189 10*3/uL (ref 150.0–400.0)
RBC: 4.81 Mil/uL (ref 4.22–5.81)
RDW: 14.6 % (ref 11.5–15.5)
WBC: 5.1 10*3/uL (ref 4.0–10.5)

## 2017-03-06 LAB — VITAMIN D 25 HYDROXY (VIT D DEFICIENCY, FRACTURES): VITD: 40.42 ng/mL (ref 30.00–100.00)

## 2017-03-06 LAB — LIPID PANEL
CHOL/HDL RATIO: 4
Cholesterol: 144 mg/dL (ref 0–200)
HDL: 41.2 mg/dL (ref >39.00)
LDL CALC: 91 mg/dL (ref 0–99)
NONHDL: 103.02
TRIGLYCERIDES: 62 mg/dL (ref 0.0–149.0)
VLDL: 12.4 mg/dL (ref 0.0–40.0)

## 2017-03-06 LAB — HEPATIC FUNCTION PANEL
ALK PHOS: 85 U/L (ref 39–117)
ALT: 15 U/L (ref 0–53)
AST: 20 U/L (ref 0–37)
Albumin: 3.9 g/dL (ref 3.5–5.2)
BILIRUBIN DIRECT: 0.2 mg/dL (ref 0.0–0.3)
BILIRUBIN TOTAL: 0.8 mg/dL (ref 0.2–1.2)
Total Protein: 6.8 g/dL (ref 6.0–8.3)

## 2017-03-06 LAB — URINALYSIS
BILIRUBIN URINE: NEGATIVE
Hgb urine dipstick: NEGATIVE
KETONES UR: NEGATIVE
Leukocytes, UA: NEGATIVE
NITRITE: NEGATIVE
Specific Gravity, Urine: 1.02 (ref 1.000–1.030)
Total Protein, Urine: NEGATIVE
UROBILINOGEN UA: 0.2 (ref 0.0–1.0)
Urine Glucose: NEGATIVE
pH: 6.5 (ref 5.0–8.0)

## 2017-03-06 LAB — POCT INR: INR: 2.6

## 2017-03-06 LAB — TSH: TSH: 0.72 u[IU]/mL (ref 0.35–4.50)

## 2017-03-06 NOTE — Patient Instructions (Addendum)
Pre visit review using our clinic review tool, if applicable. No additional management support is needed unless otherwise documented below in the visit note.  Continue to take 1 (5 mg) tablet daily.

## 2017-03-13 DIAGNOSIS — Z85828 Personal history of other malignant neoplasm of skin: Secondary | ICD-10-CM | POA: Diagnosis not present

## 2017-03-13 DIAGNOSIS — Z85820 Personal history of malignant melanoma of skin: Secondary | ICD-10-CM | POA: Diagnosis not present

## 2017-03-13 DIAGNOSIS — C4402 Squamous cell carcinoma of skin of lip: Secondary | ICD-10-CM | POA: Diagnosis not present

## 2017-03-15 ENCOUNTER — Other Ambulatory Visit: Payer: Self-pay

## 2017-03-15 MED ORDER — TAMSULOSIN HCL 0.4 MG PO CAPS: ORAL_CAPSULE | ORAL | 2 refills | Status: DC

## 2017-04-17 ENCOUNTER — Ambulatory Visit (INDEPENDENT_AMBULATORY_CARE_PROVIDER_SITE_OTHER): Payer: Medicare Other | Admitting: General Practice

## 2017-04-17 DIAGNOSIS — Z86718 Personal history of other venous thrombosis and embolism: Secondary | ICD-10-CM

## 2017-04-17 DIAGNOSIS — Z7901 Long term (current) use of anticoagulants: Secondary | ICD-10-CM | POA: Diagnosis not present

## 2017-04-17 LAB — POCT INR: INR: 2.2

## 2017-04-17 NOTE — Patient Instructions (Addendum)
Pre visit review using our clinic review tool, if applicable. No additional management support is needed unless otherwise documented below in the visit note.  Continue to take 1 (5 mg) tablet daily.  Re-check in 5 weeks.

## 2017-05-25 ENCOUNTER — Ambulatory Visit: Payer: Medicare Other

## 2017-06-05 ENCOUNTER — Ambulatory Visit (INDEPENDENT_AMBULATORY_CARE_PROVIDER_SITE_OTHER): Payer: Medicare Other | Admitting: General Practice

## 2017-06-05 DIAGNOSIS — Z7901 Long term (current) use of anticoagulants: Secondary | ICD-10-CM

## 2017-06-05 DIAGNOSIS — Z86718 Personal history of other venous thrombosis and embolism: Secondary | ICD-10-CM

## 2017-06-05 LAB — POCT INR: INR: 2.7

## 2017-06-05 NOTE — Patient Instructions (Addendum)
Pre visit review using our clinic review tool, if applicable. No additional management support is needed unless otherwise documented below in the visit note.  Continue to take 1 (5 mg) tablet daily.  Re-check in 6 weeks.

## 2017-06-26 ENCOUNTER — Other Ambulatory Visit: Payer: Self-pay | Admitting: Internal Medicine

## 2017-07-17 ENCOUNTER — Ambulatory Visit (INDEPENDENT_AMBULATORY_CARE_PROVIDER_SITE_OTHER): Payer: Medicare Other | Admitting: General Practice

## 2017-07-17 DIAGNOSIS — Z86718 Personal history of other venous thrombosis and embolism: Secondary | ICD-10-CM

## 2017-07-17 DIAGNOSIS — Z7901 Long term (current) use of anticoagulants: Secondary | ICD-10-CM | POA: Diagnosis not present

## 2017-07-17 LAB — POCT INR: INR: 2.2 (ref 2.0–3.0)

## 2017-07-17 NOTE — Patient Instructions (Addendum)
Pre visit review using our clinic review tool, if applicable. No additional management support is needed unless otherwise documented below in the visit note.  Continue to take 1 (5 mg) tablet daily.  Re-check in 6 weeks.

## 2017-08-13 DIAGNOSIS — J322 Chronic ethmoidal sinusitis: Secondary | ICD-10-CM | POA: Diagnosis not present

## 2017-08-13 DIAGNOSIS — R918 Other nonspecific abnormal finding of lung field: Secondary | ICD-10-CM | POA: Diagnosis not present

## 2017-08-13 DIAGNOSIS — J320 Chronic maxillary sinusitis: Secondary | ICD-10-CM | POA: Diagnosis not present

## 2017-08-13 DIAGNOSIS — C73 Malignant neoplasm of thyroid gland: Secondary | ICD-10-CM | POA: Diagnosis not present

## 2017-08-13 DIAGNOSIS — E890 Postprocedural hypothyroidism: Secondary | ICD-10-CM | POA: Diagnosis not present

## 2017-08-24 ENCOUNTER — Ambulatory Visit (INDEPENDENT_AMBULATORY_CARE_PROVIDER_SITE_OTHER): Payer: Medicare Other | Admitting: General Practice

## 2017-08-24 DIAGNOSIS — Z7901 Long term (current) use of anticoagulants: Secondary | ICD-10-CM

## 2017-08-24 DIAGNOSIS — Z86718 Personal history of other venous thrombosis and embolism: Secondary | ICD-10-CM

## 2017-08-24 LAB — POCT INR: INR: 2.4 (ref 2.0–3.0)

## 2017-08-24 NOTE — Patient Instructions (Addendum)
Pre visit review using our clinic review tool, if applicable. No additional management support is needed unless otherwise documented below in the visit note.  Continue to take 1 (5 mg) tablet daily.  Re-check in 6 weeks.

## 2017-08-28 ENCOUNTER — Ambulatory Visit: Payer: Medicare Other

## 2017-09-03 DIAGNOSIS — C7800 Secondary malignant neoplasm of unspecified lung: Secondary | ICD-10-CM | POA: Diagnosis not present

## 2017-09-03 DIAGNOSIS — C73 Malignant neoplasm of thyroid gland: Secondary | ICD-10-CM | POA: Diagnosis not present

## 2017-09-03 DIAGNOSIS — R0602 Shortness of breath: Secondary | ICD-10-CM | POA: Diagnosis not present

## 2017-09-03 DIAGNOSIS — R1310 Dysphagia, unspecified: Secondary | ICD-10-CM | POA: Diagnosis not present

## 2017-09-03 DIAGNOSIS — E039 Hypothyroidism, unspecified: Secondary | ICD-10-CM | POA: Diagnosis not present

## 2017-10-05 ENCOUNTER — Ambulatory Visit (INDEPENDENT_AMBULATORY_CARE_PROVIDER_SITE_OTHER): Payer: Medicare Other | Admitting: General Practice

## 2017-10-05 DIAGNOSIS — Z86718 Personal history of other venous thrombosis and embolism: Secondary | ICD-10-CM

## 2017-10-05 DIAGNOSIS — Z7901 Long term (current) use of anticoagulants: Secondary | ICD-10-CM | POA: Diagnosis not present

## 2017-10-05 LAB — POCT INR: INR: 2.8 (ref 2.0–3.0)

## 2017-10-05 NOTE — Patient Instructions (Addendum)
Pre visit review using our clinic review tool, if applicable. No additional management support is needed unless otherwise documented below in the visit note.  Continue to take 1 (5 mg) tablet daily.  Re-check in 6 weeks.

## 2017-10-25 DIAGNOSIS — D2272 Melanocytic nevi of left lower limb, including hip: Secondary | ICD-10-CM | POA: Diagnosis not present

## 2017-10-25 DIAGNOSIS — D2271 Melanocytic nevi of right lower limb, including hip: Secondary | ICD-10-CM | POA: Diagnosis not present

## 2017-10-25 DIAGNOSIS — L570 Actinic keratosis: Secondary | ICD-10-CM | POA: Diagnosis not present

## 2017-10-25 DIAGNOSIS — L812 Freckles: Secondary | ICD-10-CM | POA: Diagnosis not present

## 2017-10-25 DIAGNOSIS — Z85828 Personal history of other malignant neoplasm of skin: Secondary | ICD-10-CM | POA: Diagnosis not present

## 2017-10-25 DIAGNOSIS — C44329 Squamous cell carcinoma of skin of other parts of face: Secondary | ICD-10-CM | POA: Diagnosis not present

## 2017-10-25 DIAGNOSIS — D1801 Hemangioma of skin and subcutaneous tissue: Secondary | ICD-10-CM | POA: Diagnosis not present

## 2017-10-25 DIAGNOSIS — D225 Melanocytic nevi of trunk: Secondary | ICD-10-CM | POA: Diagnosis not present

## 2017-10-25 DIAGNOSIS — L820 Inflamed seborrheic keratosis: Secondary | ICD-10-CM | POA: Diagnosis not present

## 2017-10-25 DIAGNOSIS — L821 Other seborrheic keratosis: Secondary | ICD-10-CM | POA: Diagnosis not present

## 2017-10-25 DIAGNOSIS — D485 Neoplasm of uncertain behavior of skin: Secondary | ICD-10-CM | POA: Diagnosis not present

## 2017-10-25 DIAGNOSIS — Z85820 Personal history of malignant melanoma of skin: Secondary | ICD-10-CM | POA: Diagnosis not present

## 2017-11-11 DIAGNOSIS — Z23 Encounter for immunization: Secondary | ICD-10-CM | POA: Diagnosis not present

## 2017-11-14 DIAGNOSIS — Z85820 Personal history of malignant melanoma of skin: Secondary | ICD-10-CM | POA: Diagnosis not present

## 2017-11-14 DIAGNOSIS — C44329 Squamous cell carcinoma of skin of other parts of face: Secondary | ICD-10-CM | POA: Diagnosis not present

## 2017-11-14 DIAGNOSIS — Z85828 Personal history of other malignant neoplasm of skin: Secondary | ICD-10-CM | POA: Diagnosis not present

## 2017-11-16 ENCOUNTER — Ambulatory Visit (INDEPENDENT_AMBULATORY_CARE_PROVIDER_SITE_OTHER): Payer: Medicare Other | Admitting: General Practice

## 2017-11-16 DIAGNOSIS — Z86718 Personal history of other venous thrombosis and embolism: Secondary | ICD-10-CM

## 2017-11-16 DIAGNOSIS — Z7901 Long term (current) use of anticoagulants: Secondary | ICD-10-CM

## 2017-11-16 LAB — POCT INR: INR: 2.8 (ref 2.0–3.0)

## 2017-11-16 NOTE — Progress Notes (Signed)
Agree with management.  Binnie Rail, MD

## 2017-11-16 NOTE — Patient Instructions (Addendum)
Pre visit review using our clinic review tool, if applicable. No additional management support is needed unless otherwise documented below in the visit note.  Continue to take 1 (5 mg) tablet daily.  Re-check in 6 weeks.

## 2017-11-27 ENCOUNTER — Other Ambulatory Visit: Payer: Self-pay

## 2017-11-27 MED ORDER — TAMSULOSIN HCL 0.4 MG PO CAPS: ORAL_CAPSULE | ORAL | 2 refills | Status: DC

## 2017-12-28 ENCOUNTER — Ambulatory Visit (INDEPENDENT_AMBULATORY_CARE_PROVIDER_SITE_OTHER): Payer: Medicare Other | Admitting: General Practice

## 2017-12-28 DIAGNOSIS — Z7901 Long term (current) use of anticoagulants: Secondary | ICD-10-CM | POA: Diagnosis not present

## 2017-12-28 DIAGNOSIS — Z86718 Personal history of other venous thrombosis and embolism: Secondary | ICD-10-CM

## 2017-12-28 LAB — POCT INR: INR: 2.5 (ref 2.0–3.0)

## 2017-12-28 NOTE — Patient Instructions (Incomplete)
Pre visit review using our clinic review tool, if applicable. No additional management support is needed unless otherwise documented below in the visit note.  Continue to take 1 (5 mg) tablet daily.  Re-check in 6 weeks.

## 2018-01-21 DIAGNOSIS — L858 Other specified epidermal thickening: Secondary | ICD-10-CM | POA: Diagnosis not present

## 2018-01-21 DIAGNOSIS — Z85820 Personal history of malignant melanoma of skin: Secondary | ICD-10-CM | POA: Diagnosis not present

## 2018-01-21 DIAGNOSIS — L821 Other seborrheic keratosis: Secondary | ICD-10-CM | POA: Diagnosis not present

## 2018-01-21 DIAGNOSIS — D1801 Hemangioma of skin and subcutaneous tissue: Secondary | ICD-10-CM | POA: Diagnosis not present

## 2018-01-21 DIAGNOSIS — L853 Xerosis cutis: Secondary | ICD-10-CM | POA: Diagnosis not present

## 2018-01-21 DIAGNOSIS — D2271 Melanocytic nevi of right lower limb, including hip: Secondary | ICD-10-CM | POA: Diagnosis not present

## 2018-01-21 DIAGNOSIS — D225 Melanocytic nevi of trunk: Secondary | ICD-10-CM | POA: Diagnosis not present

## 2018-01-21 DIAGNOSIS — L570 Actinic keratosis: Secondary | ICD-10-CM | POA: Diagnosis not present

## 2018-01-21 DIAGNOSIS — L812 Freckles: Secondary | ICD-10-CM | POA: Diagnosis not present

## 2018-01-21 DIAGNOSIS — Z85828 Personal history of other malignant neoplasm of skin: Secondary | ICD-10-CM | POA: Diagnosis not present

## 2018-01-28 DIAGNOSIS — H04123 Dry eye syndrome of bilateral lacrimal glands: Secondary | ICD-10-CM | POA: Diagnosis not present

## 2018-01-28 DIAGNOSIS — H524 Presbyopia: Secondary | ICD-10-CM | POA: Diagnosis not present

## 2018-01-28 DIAGNOSIS — H43813 Vitreous degeneration, bilateral: Secondary | ICD-10-CM | POA: Diagnosis not present

## 2018-01-28 DIAGNOSIS — Z961 Presence of intraocular lens: Secondary | ICD-10-CM | POA: Diagnosis not present

## 2018-02-15 ENCOUNTER — Ambulatory Visit (INDEPENDENT_AMBULATORY_CARE_PROVIDER_SITE_OTHER): Payer: Medicare Other | Admitting: General Practice

## 2018-02-15 DIAGNOSIS — Z7901 Long term (current) use of anticoagulants: Secondary | ICD-10-CM | POA: Diagnosis not present

## 2018-02-15 DIAGNOSIS — Z86718 Personal history of other venous thrombosis and embolism: Secondary | ICD-10-CM

## 2018-02-15 LAB — POCT INR: INR: 1.9 — AB (ref 2.0–3.0)

## 2018-02-15 NOTE — Patient Instructions (Signed)
Pre visit review using our clinic review tool, if applicable. No additional management support is needed unless otherwise documented below in the visit note. 

## 2018-03-07 ENCOUNTER — Encounter: Payer: Self-pay | Admitting: Internal Medicine

## 2018-03-07 ENCOUNTER — Ambulatory Visit (INDEPENDENT_AMBULATORY_CARE_PROVIDER_SITE_OTHER): Payer: Medicare Other | Admitting: Internal Medicine

## 2018-03-07 ENCOUNTER — Other Ambulatory Visit (INDEPENDENT_AMBULATORY_CARE_PROVIDER_SITE_OTHER): Payer: Medicare Other

## 2018-03-07 VITALS — BP 132/78 | HR 61 | Temp 97.9°F | Ht 76.0 in | Wt 204.0 lb

## 2018-03-07 DIAGNOSIS — R202 Paresthesia of skin: Secondary | ICD-10-CM | POA: Diagnosis not present

## 2018-03-07 DIAGNOSIS — C73 Malignant neoplasm of thyroid gland: Secondary | ICD-10-CM

## 2018-03-07 DIAGNOSIS — E781 Pure hyperglyceridemia: Secondary | ICD-10-CM

## 2018-03-07 DIAGNOSIS — Z0000 Encounter for general adult medical examination without abnormal findings: Secondary | ICD-10-CM

## 2018-03-07 DIAGNOSIS — N320 Bladder-neck obstruction: Secondary | ICD-10-CM | POA: Diagnosis not present

## 2018-03-07 DIAGNOSIS — C439 Malignant melanoma of skin, unspecified: Secondary | ICD-10-CM

## 2018-03-07 DIAGNOSIS — D6859 Other primary thrombophilia: Secondary | ICD-10-CM

## 2018-03-07 DIAGNOSIS — R1313 Dysphagia, pharyngeal phase: Secondary | ICD-10-CM

## 2018-03-07 DIAGNOSIS — E559 Vitamin D deficiency, unspecified: Secondary | ICD-10-CM

## 2018-03-07 DIAGNOSIS — R1310 Dysphagia, unspecified: Secondary | ICD-10-CM | POA: Insufficient documentation

## 2018-03-07 LAB — PSA: PSA: 8.06 ng/mL — ABNORMAL HIGH (ref 0.10–4.00)

## 2018-03-07 LAB — LIPID PANEL
Cholesterol: 136 mg/dL (ref 0–200)
HDL: 38.3 mg/dL — ABNORMAL LOW (ref >39.00)
LDL Cholesterol: 84 mg/dL (ref 0–99)
NonHDL: 98.03
Total CHOL/HDL Ratio: 4
Triglycerides: 72 mg/dL (ref 0.0–149.0)
VLDL: 14.4 mg/dL (ref 0.0–40.0)

## 2018-03-07 LAB — CBC WITH DIFFERENTIAL/PLATELET
Basophils Absolute: 0 10*3/uL (ref 0.0–0.1)
Basophils Relative: 0.6 % (ref 0.0–3.0)
Eosinophils Absolute: 0.2 10*3/uL (ref 0.0–0.7)
Eosinophils Relative: 2.9 % (ref 0.0–5.0)
HCT: 43 % (ref 39.0–52.0)
HEMOGLOBIN: 13.9 g/dL (ref 13.0–17.0)
Lymphocytes Relative: 25.8 % (ref 12.0–46.0)
Lymphs Abs: 1.5 10*3/uL (ref 0.7–4.0)
MCHC: 32.5 g/dL (ref 30.0–36.0)
MCV: 86.3 fl (ref 78.0–100.0)
MONO ABS: 0.5 10*3/uL (ref 0.1–1.0)
Monocytes Relative: 8.7 % (ref 3.0–12.0)
Neutro Abs: 3.7 10*3/uL (ref 1.4–7.7)
Neutrophils Relative %: 62 % (ref 43.0–77.0)
Platelets: 202 10*3/uL (ref 150.0–400.0)
RBC: 4.98 Mil/uL (ref 4.22–5.81)
RDW: 13.6 % (ref 11.5–15.5)
WBC: 5.9 10*3/uL (ref 4.0–10.5)

## 2018-03-07 LAB — HEPATIC FUNCTION PANEL
ALK PHOS: 77 U/L (ref 39–117)
ALT: 17 U/L (ref 0–53)
AST: 20 U/L (ref 0–37)
Albumin: 4 g/dL (ref 3.5–5.2)
Bilirubin, Direct: 0.2 mg/dL (ref 0.0–0.3)
Total Bilirubin: 0.8 mg/dL (ref 0.2–1.2)
Total Protein: 6.9 g/dL (ref 6.0–8.3)

## 2018-03-07 LAB — URINALYSIS
Bilirubin Urine: NEGATIVE
HGB URINE DIPSTICK: NEGATIVE
Ketones, ur: NEGATIVE
Leukocytes, UA: NEGATIVE
Nitrite: NEGATIVE
Specific Gravity, Urine: 1.02 (ref 1.000–1.030)
Total Protein, Urine: NEGATIVE
UROBILINOGEN UA: 0.2 (ref 0.0–1.0)
Urine Glucose: NEGATIVE
pH: 6 (ref 5.0–8.0)

## 2018-03-07 LAB — BASIC METABOLIC PANEL
BUN: 17 mg/dL (ref 6–23)
CO2: 29 mEq/L (ref 19–32)
Calcium: 9.5 mg/dL (ref 8.4–10.5)
Chloride: 102 mEq/L (ref 96–112)
Creatinine, Ser: 0.98 mg/dL (ref 0.40–1.50)
GFR: 72.61 mL/min (ref >60.00)
Glucose, Bld: 84 mg/dL (ref 70–99)
Potassium: 4.7 mEq/L (ref 3.5–5.1)
Sodium: 138 mEq/L (ref 135–145)

## 2018-03-07 LAB — VITAMIN D 25 HYDROXY (VIT D DEFICIENCY, FRACTURES): VITD: 51.09 ng/mL (ref 30.00–100.00)

## 2018-03-07 LAB — VITAMIN B12: Vitamin B-12: 324 pg/mL (ref 211–911)

## 2018-03-07 LAB — TSH: TSH: 0.47 u[IU]/mL (ref 0.35–4.50)

## 2018-03-07 MED ORDER — ERYTHROMYCIN 5 MG/GM OP OINT: 1.0000 "application " | TOPICAL_OINTMENT | Freq: Every day | OPHTHALMIC | 3 refills | Status: DC

## 2018-03-07 MED ORDER — FAMOTIDINE 40 MG PO TABS: 40.0000 mg | ORAL_TABLET | Freq: Every day | ORAL | 11 refills | Status: DC

## 2018-03-07 NOTE — Assessment & Plan Note (Signed)
Long time Trial of Pepcid

## 2018-03-07 NOTE — Assessment & Plan Note (Signed)
Derm exam q 3 mo

## 2018-03-07 NOTE — Assessment & Plan Note (Addendum)
Metastatic thyroid ca, hypothyroidism - last PET scan 7/19, q 12 mo now at [facility]

## 2018-03-07 NOTE — Progress Notes (Signed)
Subjective:  Patient ID: Clarence Hopkins, male    DOB: Dec 13, 1930  Age: 85 y.o. MRN: [id number]  CC: No chief complaint on file.   HPI REVIS WHALIN presents for a [facility] well F/u thyroid ca, hypothyroidism - last PET scan 7/19, q 12 mo now C/o dysphagia  Outpatient Medications Prior to Visit  Medication Sig Dispense Refill  . b complex vitamins tablet Take 1 tablet by mouth daily. 100 tablet 3  . tamsulosin (FLOMAX) 0.4 MG CAPS capsule TAKE 1 CAPSULE BY MOUTH DAILY AFTER SUPPER 90 capsule 2  . warfarin (COUMADIN) 5 MG tablet TAKE AS DIRECTED BY ANTICOAGULATION CLINIC 105 tablet 3  . levothyroxine (SYNTHROID, LEVOTHROID) 175 MCG tablet Take 1 tablet by mouth daily.     No facility-administered medications prior to visit.     ROS: Review of Systems  Constitutional: Negative for appetite change, fatigue and unexpected weight change.  HENT: Negative for congestion, nosebleeds, sneezing, sore throat and trouble swallowing.   Eyes: Positive for pain and itching. Negative for visual disturbance.  Respiratory: Negative for cough.   Cardiovascular: Negative for chest pain, palpitations and leg swelling.  Gastrointestinal: Negative for abdominal distention, blood in stool, diarrhea and nausea.  Genitourinary: Negative for frequency and hematuria.  Musculoskeletal: Positive for gait problem. Negative for back pain, joint swelling and neck pain.  Skin: Negative for rash.  Neurological: Negative for dizziness, tremors, speech difficulty and weakness.  Psychiatric/Behavioral: Negative for agitation, dysphoric mood, sleep disturbance and suicidal ideas. The patient is not nervous/anxious.     Objective:  BP 132/78 (BP Location: Left Arm, Patient Position: Sitting, Cuff Size: Large)   Pulse 61   Temp 97.9 F (36.6 C) (Oral)   Ht 6\' 4"  (1.93 m)   Wt 204 lb (92.5 kg)   SpO2 98%   BMI 24.83 kg/m   BP Readings from Last 3 Encounters:  03/07/18 132/78  03/05/17 128/82  02/04/16 138/82    Wt  Readings from Last 3 Encounters:  03/07/18 204 lb (92.5 kg)  03/05/17 207 lb (93.9 kg)  02/04/16 208 lb (94.3 kg)    Physical Exam Constitutional:      General: He is not in acute distress.    Appearance: He is well-developed.     Comments: NAD  Eyes:     Conjunctiva/sclera: Conjunctivae normal.     Pupils: Pupils are equal, round, and reactive to light.  Neck:     Musculoskeletal: Normal range of motion.     Thyroid: No thyromegaly.     Vascular: No JVD.  Cardiovascular:     Rate and Rhythm: Normal rate and regular rhythm.     Heart sounds: Normal heart sounds. No murmur. No friction rub. No gallop.   Pulmonary:     Effort: Pulmonary effort is normal. No respiratory distress.     Breath sounds: Normal breath sounds. No wheezing or rales.  Chest:     Chest wall: No tenderness.  Abdominal:     General: Bowel sounds are normal. There is no distension.     Palpations: Abdomen is soft. There is no mass.     Tenderness: There is no abdominal tenderness. There is no guarding or rebound.  Musculoskeletal: Normal range of motion.        General: No tenderness.  Lymphadenopathy:     Cervical: No cervical adenopathy.  Skin:    General: Skin is warm and dry.     Findings: No rash.  Neurological:  Mental Status: He is alert and oriented to person, place, and time.     Cranial Nerves: No cranial nerve deficit.     Motor: No abnormal muscle tone.     Coordination: Coordination abnormal.     Gait: Gait normal.     Deep Tendon Reflexes: Reflexes are normal and symmetric.  Psychiatric:        Behavior: Behavior normal.        Thought Content: Thought content normal.        Judgment: Judgment normal.   stiff R lower eyelid deformed   Lab Results  Component Value Date   WBC 5.1 03/06/2017   HGB 13.7 03/06/2017   HCT 41.7 03/06/2017   PLT 189.0 03/06/2017   GLUCOSE 102 (H) 03/06/2017   CHOL 144 03/06/2017   TRIG 62.0 03/06/2017   HDL 41.20 03/06/2017   LDLDIRECT 116.0  03/31/2013   LDLCALC 91 03/06/2017   ALT 15 03/06/2017   AST 20 03/06/2017   NA 139 03/06/2017   K 4.6 03/06/2017   CL 103 03/06/2017   CREATININE 0.94 03/06/2017   BUN 15 03/06/2017   CO2 31 03/06/2017   TSH 0.72 03/06/2017   PSA 6.44 (H) 02/04/2016   INR 1.9 (A) 02/15/2018    No results found.  Assessment & Plan:   There are no diagnoses linked to this encounter.   No orders of the defined types were placed in this encounter.    Follow-up: No follow-ups on file.  Walker Kehr, MD

## 2018-03-07 NOTE — Assessment & Plan Note (Signed)
Coumadin 

## 2018-03-07 NOTE — Patient Instructions (Signed)
Chair exercise class

## 2018-03-07 NOTE — Assessment & Plan Note (Signed)

## 2018-03-29 ENCOUNTER — Ambulatory Visit (INDEPENDENT_AMBULATORY_CARE_PROVIDER_SITE_OTHER): Payer: Medicare Other | Admitting: General Practice

## 2018-03-29 DIAGNOSIS — Z86718 Personal history of other venous thrombosis and embolism: Secondary | ICD-10-CM

## 2018-03-29 DIAGNOSIS — Z7901 Long term (current) use of anticoagulants: Secondary | ICD-10-CM | POA: Diagnosis not present

## 2018-03-29 LAB — POCT INR: INR: 3.2 — AB (ref 2.0–3.0)

## 2018-03-29 NOTE — Patient Instructions (Addendum)
Pre visit review using our clinic review tool, if applicable. No additional management support is needed unless otherwise documented below in the visit note.  Skip dosage tomorrow (2/15) and then continue to take 1 (5 mg) tablet daily.  Re-check in 6 weeks.

## 2018-04-29 DIAGNOSIS — L723 Sebaceous cyst: Secondary | ICD-10-CM | POA: Diagnosis not present

## 2018-04-29 DIAGNOSIS — D1801 Hemangioma of skin and subcutaneous tissue: Secondary | ICD-10-CM | POA: Diagnosis not present

## 2018-04-29 DIAGNOSIS — Z85820 Personal history of malignant melanoma of skin: Secondary | ICD-10-CM | POA: Diagnosis not present

## 2018-04-29 DIAGNOSIS — L570 Actinic keratosis: Secondary | ICD-10-CM | POA: Diagnosis not present

## 2018-04-29 DIAGNOSIS — L821 Other seborrheic keratosis: Secondary | ICD-10-CM | POA: Diagnosis not present

## 2018-04-29 DIAGNOSIS — Z85828 Personal history of other malignant neoplasm of skin: Secondary | ICD-10-CM | POA: Diagnosis not present

## 2018-04-29 DIAGNOSIS — D2271 Melanocytic nevi of right lower limb, including hip: Secondary | ICD-10-CM | POA: Diagnosis not present

## 2018-04-30 ENCOUNTER — Encounter (HOSPITAL_COMMUNITY): Payer: Self-pay | Admitting: Emergency Medicine

## 2018-04-30 ENCOUNTER — Ambulatory Visit: Payer: Self-pay

## 2018-04-30 ENCOUNTER — Emergency Department (HOSPITAL_COMMUNITY)
Admission: EM | Admit: 2018-04-30 | Discharge: 2018-04-30 | Disposition: A | Discharge status: Home / Self Care | Payer: Medicare Other | Attending: Emergency Medicine | Admitting: Emergency Medicine

## 2018-04-30 ENCOUNTER — Other Ambulatory Visit: Payer: Self-pay

## 2018-04-30 ENCOUNTER — Emergency Department (HOSPITAL_COMMUNITY): Payer: Medicare Other

## 2018-04-30 DIAGNOSIS — Z79899 Other long term (current) drug therapy: Secondary | ICD-10-CM | POA: Diagnosis not present

## 2018-04-30 DIAGNOSIS — E039 Hypothyroidism, unspecified: Secondary | ICD-10-CM | POA: Diagnosis not present

## 2018-04-30 DIAGNOSIS — Z85828 Personal history of other malignant neoplasm of skin: Secondary | ICD-10-CM | POA: Diagnosis not present

## 2018-04-30 DIAGNOSIS — R0602 Shortness of breath: Secondary | ICD-10-CM | POA: Diagnosis not present

## 2018-04-30 DIAGNOSIS — Z96691 Finger-joint replacement of right hand: Secondary | ICD-10-CM | POA: Diagnosis not present

## 2018-04-30 DIAGNOSIS — Z85820 Personal history of malignant melanoma of skin: Secondary | ICD-10-CM | POA: Diagnosis not present

## 2018-04-30 DIAGNOSIS — R002 Palpitations: Secondary | ICD-10-CM | POA: Insufficient documentation

## 2018-04-30 DIAGNOSIS — Z7901 Long term (current) use of anticoagulants: Secondary | ICD-10-CM | POA: Diagnosis not present

## 2018-04-30 DIAGNOSIS — Z96652 Presence of left artificial knee joint: Secondary | ICD-10-CM | POA: Diagnosis not present

## 2018-04-30 DIAGNOSIS — R079 Chest pain, unspecified: Secondary | ICD-10-CM | POA: Diagnosis not present

## 2018-04-30 DIAGNOSIS — Z87891 Personal history of nicotine dependence: Secondary | ICD-10-CM | POA: Diagnosis not present

## 2018-04-30 LAB — CBC
HCT: 44.3 % (ref 39.0–52.0)
Hemoglobin: 13.3 g/dL (ref 13.0–17.0)
MCH: 27.5 pg (ref 26.0–34.0)
MCHC: 30 g/dL (ref 30.0–36.0)
MCV: 91.5 fL (ref 80.0–100.0)
Platelets: 174 10*3/uL (ref 150–400)
RBC: 4.84 MIL/uL (ref 4.22–5.81)
RDW: 13 % (ref 11.5–15.5)
WBC: 5.6 10*3/uL (ref 4.0–10.5)
nRBC: 0 % (ref 0.0–0.2)

## 2018-04-30 LAB — BASIC METABOLIC PANEL
Anion gap: 5 (ref 5–15)
BUN: 18 mg/dL (ref 8–23)
CO2: 28 mmol/L (ref 22–32)
Calcium: 8.8 mg/dL — ABNORMAL LOW (ref 8.9–10.3)
Chloride: 106 mmol/L (ref 98–111)
Creatinine, Ser: 0.92 mg/dL (ref 0.61–1.24)
GFR calc Af Amer: >60 mL/min (ref >60)
GFR calc non Af Amer: >60 mL/min (ref >60)
GLUCOSE: 95 mg/dL (ref 70–99)
Potassium: 4.7 mmol/L (ref 3.5–5.1)
Sodium: 139 mmol/L (ref 135–145)

## 2018-04-30 LAB — I-STAT TROPONIN, ED: TROPONIN I, POC: 0.02 ng/mL (ref 0.00–0.08)

## 2018-04-30 LAB — PROTIME-INR
INR: 2.7 — ABNORMAL HIGH (ref 0.8–1.2)
Prothrombin Time: 28.5 seconds — ABNORMAL HIGH (ref 11.4–15.2)

## 2018-04-30 LAB — TSH: TSH: 1.433 u[IU]/mL (ref 0.350–4.500)

## 2018-04-30 NOTE — ED Provider Notes (Signed)
[facility] DEPT Provider Note   CSN: [id number] Arrival date & time: 04/30/18  1029    History   Chief Complaint Chief Complaint  Patient presents with  . Palpitations    HPI Clarence Hopkins is a 85 y.o. male.     The history is provided by the patient.  Palpitations  Palpitations quality:  Fast Onset quality:  Sudden Timing:  Intermittent Progression:  Resolved Chronicity:  New Context comment:  No known changes or precipitating events Relieved by:  Nothing Worsened by:  Nothing Ineffective treatments:  None tried Associated symptoms: no back pain, no chest pain, no cough, no diaphoresis, no leg pain, no lower extremity edema, no nausea, no vomiting and no weakness   Associated symptoms comment:  Minimal shortness of breath but states no significant shortness of breath.  States otherwise he has been feeling pretty normal.  Called his doctor today and they sent him here for further care.  Patient states he does have thyroid cancer with mets to the lung but he chose not to be treated.  He does not feel like this is any different. Risk factors: hx of PE and hx of thyroid disease   Risk factors: no hx of atrial fibrillation   Risk factors comment:  On coumadin   Past Medical History:  Diagnosis Date  . Arthritis   . Cancer ([place]) 2003   melanoma  L ear  . Hypothyroidism    post op  . Pulmonary embolism ([place]) 2005 & 2007   protein C deficiency    Patient Active Problem List   Diagnosis Date Noted  . Dysphagia 03/07/2018  . Thyroid cancer ([place]) 02/10/2016  . Medicare annual wellness visit, subsequent 02/02/2015  . Hypertriglyceridemia 12/22/2013  . Encounter for therapeutic drug monitoring 04/09/2013  . History of basal cell cancer 10/30/2012  . Long term (current) use of anticoagulants 05/23/2010  . Protein C deficiency ([place]) 11/05/2009  . Post-surgical hypothyroidism 01/13/2008  . Elevated PSA measurement 01/13/2008  . Melanoma  ([place]) 01/13/2008  . DISPLCMT CERV INTERVERT [place] WITHOUT MYELOPATHY 02/15/2007  . PULMONARY EMBOLISM, HX OF 11/09/2006    Past Surgical History:  Procedure Laterality Date  . CERVICAL FUSION      X 2; Dr Vertell Limber  . COLONOSCOPY    . FINGER ARTHROPLASTY Right 04/17/2013   Procedure: IMPLANT ARTHROPLASTY RIGHT INDEX;  Surgeon: Cammie Sickle., MD;  Location: [facility];  Service: Orthopedics;  Laterality: Right;  . LUMBAR LAMINECTOMY  2010   Dr Becky Sax  . MELANOMA EXCISION  2003   L ear  . THYROIDECTOMY  1971   cytology indefinite  . TONSILLECTOMY    . TOTAL KNEE ARTHROPLASTY  2004   left        Home Medications    Prior to Admission medications   Medication Sig Start Date End Date Taking? Authorizing Provider  b complex vitamins tablet Take 1 tablet by mouth daily. 03/05/17   Plotnikov, Evie Lacks, MD  erythromycin ophthalmic ointment Place 1 application into both eyes at bedtime. 03/07/18   Plotnikov, Evie Lacks, MD  famotidine (PEPCID) 40 MG tablet Take 1 tablet (40 mg total) by mouth daily. 03/07/18   Plotnikov, Evie Lacks, MD  levothyroxine (SYNTHROID, LEVOTHROID) 175 MCG tablet Take 1 tablet by mouth daily. 01/26/16 01/25/17  [provider]  tamsulosin (FLOMAX) 0.4 MG CAPS capsule TAKE 1 CAPSULE BY MOUTH DAILY AFTER SUPPER 11/27/17   Plotnikov, Evie Lacks, MD  warfarin (COUMADIN) 5  MG tablet TAKE AS DIRECTED BY ANTICOAGULATION CLINIC 06/27/17   Plotnikov, Evie Lacks, MD    Family History Family History  Problem Relation Age of Onset  . Breast cancer Mother   . Alzheimer's disease Father   . Breast cancer Maternal Grandmother   . Breast cancer Sister   . Diabetes Neg Hx   . Stroke Neg Hx   . Heart disease Neg Hx     Social History Social History   Tobacco Use  . Smoking status: Former Smoker    Packs/day: 0.75    Years: 41.00    Pack years: 30.75    Types: Cigarettes    Last attempt to quit: 02/13/1993    Years since quitting: 25.2  .  Smokeless tobacco: Never Used  . Tobacco comment: smoked 1956-1995, up to 1/2 ppd  Substance Use Topics  . Alcohol use: Yes    Comment:  Vodka 2 oz/ night  . Drug use: No     Allergies   Patient has no known allergies.   Review of Systems Review of Systems  Constitutional: Negative for diaphoresis.  Respiratory: Negative for cough.   Cardiovascular: Positive for palpitations. Negative for chest pain.  Gastrointestinal: Negative for nausea and vomiting.  Musculoskeletal: Negative for back pain.  Neurological: Negative for weakness.  All other systems reviewed and are negative.    Physical Exam Updated Vital Signs BP (!) 150/86 (BP Location: Left Arm)   Pulse 61   Temp (!) 97.5 F (36.4 C) (Oral)   Resp 19   Ht 6\' 3"  (1.905 m)   Wt 91.6 kg   SpO2 100%   BMI 25.25 kg/m   Physical Exam Vitals signs and nursing note reviewed.  Constitutional:      General: He is not in acute distress.    Appearance: He is well-developed.  HENT:     Head: Normocephalic and atraumatic.  Eyes:     Conjunctiva/sclera: Conjunctivae normal.     Pupils: Pupils are equal, round, and reactive to light.  Neck:     Musculoskeletal: Normal range of motion and neck supple.  Cardiovascular:     Rate and Rhythm: Normal rate and regular rhythm.     Heart sounds: No murmur.  Pulmonary:     Effort: Pulmonary effort is normal. No respiratory distress.     Breath sounds: Normal breath sounds. No wheezing or rales.  Abdominal:     General: There is no distension.     Palpations: Abdomen is soft.     Tenderness: There is no abdominal tenderness. There is no guarding or rebound.  Musculoskeletal: Normal range of motion.        General: No tenderness.  Skin:    General: Skin is warm and dry.     Capillary Refill: Capillary refill takes less than 2 seconds.     Findings: No erythema or rash.  Neurological:     General: No focal deficit present.     Mental Status: He is alert and oriented to  person, place, and time. Mental status is at baseline.  Psychiatric:        Mood and Affect: Mood normal.        Behavior: Behavior normal.        Thought Content: Thought content normal.      ED Treatments / Results  Labs (all labs ordered are listed, but only abnormal results are displayed) Labs Reviewed  BASIC METABOLIC PANEL - Abnormal; Notable for the following components:  Result Value   Calcium 8.8 (*)    All other components within normal limits  PROTIME-INR - Abnormal; Notable for the following components:   Prothrombin Time 28.5 (*)    INR 2.7 (*)    All other components within normal limits  CBC  TSH  I-STAT TROPONIN, ED    EKG EKG Interpretation  Date/Time:  Tuesday April 30 2018 10:47:30 EDT Ventricular Rate:  61 PR Interval:    QRS Duration: 99 QT Interval:  414 QTC Calculation: 417 R Axis:   77 Text Interpretation:  Sinus rhythm No significant change since last tracing Confirmed by Blanchie Dessert [phone]) on 04/30/2018 11:07:24 AM   Radiology Dg Chest 2 View  Result Date: 04/30/2018 CLINICAL DATA:  Chest pain EXAM: CHEST - 2 VIEW COMPARISON:  02/15/2007 FINDINGS: The heart size and mediastinal contours are within normal limits. No acute airspace opacity. Masslike opacities of the infrahilar right lung and right lung base. The visualized skeletal structures are unremarkable. IMPRESSION: No acute airspace opacity. Masslike opacities of the infrahilar right lung and right lung base. Recommend CT to further evaluate. Electronically Signed   By: Eddie Candle M.D.   On: 04/30/2018 11:53    Procedures Procedures (including critical care time)  Medications Ordered in ED Medications - No data to display   Initial Impression / Assessment and Plan / ED Course  I have reviewed the triage vital signs and the nursing notes.  Pertinent labs & imaging results that were available during my care of the patient were reviewed by me and considered in my medical  decision making (see chart for details).        Patient is a well-appearing elderly gentleman presenting today with intermittent bouts of palpitations that have been ongoing over the last 8 days.  They seem to be unprovoked but do not last very long.  He has never had this before.  He is currently not having the symptoms right now and is in sinus rhythm with a normal EKG.  Patient does have a history of thyroid disease and malignancy.  Will check a TSH as well as baseline labs with potassium and magnesium.  Patient is currently on Coumadin due to recurrent PEs last INR was checked within the month but it was a little on the low side.  He states some mild shortness of breath but nothing severe.  He denies any chest pain or infectious symptoms.  12:53 PM Labs are reassuring, chest x-ray with masslike opacity but patient is already aware of this.  Patient did have 1 PVC while he was here.  This could be what is causing his symptoms.  Will discuss with cardiology about getting patient set up for monitoring as an outpatient.  Otherwise feel that he is stable for discharge.  Cardiology will call for follow up monitor placement.  Final Clinical Impressions(s) / ED Diagnoses   Final diagnoses:  Palpitations    ED Discharge Orders    None       Blanchie Dessert, MD 04/30/18 1355

## 2018-04-30 NOTE — ED Notes (Signed)
Pt d/c home per MD order. Discharge summary reviewed with pt. Pt verbalizes understanding. Pt signed e-signature, Ambulatory off unit.

## 2018-04-30 NOTE — ED Notes (Signed)
Patient transported to X-ray 

## 2018-04-30 NOTE — Telephone Encounter (Signed)
Pt called to say he has had fluttering sensation in his heart.  He was at another office yesterday and they took his HR. It was 60 with skipped beats. They suggested he call his PCP. Pt has symptoms of fluttering or pausing of his heart.He denies chest pain. He feels lightheaded. He took his pulse for 15 seconds and got a rate of 10 then he states it stops.  It is regular until that point.  He states he always walks with his wife but has had to stop and rest to catch his breath which is unusual. Per protocol pt will go to [facility] for evaluation of his symptoms.  Care advice read to patient. Pt verbalized understanding of all instructions.  Reason for Disposition . Heart beating very slowly (e.g., < 50 / minute)  (Exception: athlete)  Answer Assessment - Initial Assessment Questions 1. DESCRIPTION: "Please describe your heart rate or heart beat that you are having" (e.g., fast/slow, regular/irregular, skipped or extra beats, "palpitations")     Flutter HR 60 per office visit yesterday 2. ONSET: "When did it start?" (Minutes, hours or days)      3-4 days maybe 7-8 days 3. DURATION: "How long does it last" (e.g., seconds, minutes, hours)     3-5 seconds 4. PATTERN "Does it come and go, or has it been constant since it started?"  "Does it get worse with exertion?"   "Are you feeling it now?"     Lightheaded feeling when exerting 5. TAP: "Using your hand, can you tap out what you are feeling on a chair or table in front of you, so that I can hear?" (Note: not all patients can do this)       Even regular beat then stops flutters 6. HEART RATE: "Can you tell me your heart rate?" "How many beats in 15 seconds?"  (Note: not all patients can do this)       10 with pause 7. RECURRENT SYMPTOM: "Have you ever had this before?" If so, ask: "When was the last time?" and "What happened that time?"     No 8. CAUSE: "What do you think is causing the palpitations?"     Unsure  9. CARDIAC HISTORY: "Do you have  any history of heart disease?" (e.g., heart attack, angina, bypass surgery, angioplasty, arrhythmia)      no 10. OTHER SYMPTOMS: "Do you have any other symptoms?" (e.g., dizziness, chest pain, sweating, difficulty breathing)      Lightheaded ,denies pain, Sometimes feels SOB 11. PREGNANCY: "Is there any chance you are pregnant?" "When was your last menstrual period?"       N/A  Protocols used: HEART RATE AND HEARTBEAT QUESTIONS-A-AH

## 2018-04-30 NOTE — ED Triage Notes (Signed)
Pt on warfarin for hx blood clots.

## 2018-04-30 NOTE — Telephone Encounter (Signed)
Pt at ED.

## 2018-04-30 NOTE — ED Triage Notes (Signed)
Pt reports has intermittent heart flutters for several days. Reports drinks 2oz vodka daily.

## 2018-05-01 ENCOUNTER — Ambulatory Visit (INDEPENDENT_AMBULATORY_CARE_PROVIDER_SITE_OTHER): Payer: Medicare Other | Admitting: General Practice

## 2018-05-01 DIAGNOSIS — Z86718 Personal history of other venous thrombosis and embolism: Secondary | ICD-10-CM | POA: Diagnosis not present

## 2018-05-01 DIAGNOSIS — Z7901 Long term (current) use of anticoagulants: Secondary | ICD-10-CM | POA: Diagnosis not present

## 2018-05-01 DIAGNOSIS — I82409 Acute embolism and thrombosis of unspecified deep veins of unspecified lower extremity: Secondary | ICD-10-CM | POA: Diagnosis not present

## 2018-05-01 NOTE — Patient Instructions (Signed)
Pre visit review using our clinic review tool, if applicable. No additional management support is needed unless otherwise documented below in the visit note.  Patient had INR at ER.  Continue to take 1 (5 mg) tablet daily.  Re-check in 6 weeks.

## 2018-05-03 ENCOUNTER — Ambulatory Visit: Payer: Medicare Other

## 2018-06-14 ENCOUNTER — Ambulatory Visit (INDEPENDENT_AMBULATORY_CARE_PROVIDER_SITE_OTHER): Payer: Medicare Other | Admitting: General Practice

## 2018-06-14 DIAGNOSIS — Z7901 Long term (current) use of anticoagulants: Secondary | ICD-10-CM

## 2018-06-14 DIAGNOSIS — Z86718 Personal history of other venous thrombosis and embolism: Secondary | ICD-10-CM

## 2018-06-14 LAB — POCT INR: INR: 2.3 (ref 2.0–3.0)

## 2018-06-14 NOTE — Patient Instructions (Addendum)
Pre visit review using our clinic review tool, if applicable. No additional management support is needed unless otherwise documented below in the visit note.  Continue to take 1 (5 mg) tablet daily.  Re-check in 6 weeks.

## 2018-07-26 ENCOUNTER — Ambulatory Visit (INDEPENDENT_AMBULATORY_CARE_PROVIDER_SITE_OTHER): Payer: Medicare Other | Admitting: General Practice

## 2018-07-26 ENCOUNTER — Other Ambulatory Visit: Payer: Self-pay

## 2018-07-26 DIAGNOSIS — Z7901 Long term (current) use of anticoagulants: Secondary | ICD-10-CM

## 2018-07-26 DIAGNOSIS — Z86718 Personal history of other venous thrombosis and embolism: Secondary | ICD-10-CM

## 2018-07-26 LAB — POCT INR: INR: 2.3 (ref 2.0–3.0)

## 2018-07-26 NOTE — Patient Instructions (Addendum)
Pre visit review using our clinic review tool, if applicable. No additional management support is needed unless otherwise documented below in the visit note.  Continue to take 1 (5 mg) tablet daily.  Re-check in 6 weeks.

## 2018-07-29 DIAGNOSIS — J322 Chronic ethmoidal sinusitis: Secondary | ICD-10-CM | POA: Diagnosis not present

## 2018-07-29 DIAGNOSIS — C73 Malignant neoplasm of thyroid gland: Secondary | ICD-10-CM | POA: Diagnosis not present

## 2018-07-29 DIAGNOSIS — R918 Other nonspecific abnormal finding of lung field: Secondary | ICD-10-CM | POA: Diagnosis not present

## 2018-07-30 DIAGNOSIS — Z85820 Personal history of malignant melanoma of skin: Secondary | ICD-10-CM | POA: Diagnosis not present

## 2018-07-30 DIAGNOSIS — C44719 Basal cell carcinoma of skin of left lower limb, including hip: Secondary | ICD-10-CM | POA: Diagnosis not present

## 2018-07-30 DIAGNOSIS — C44712 Basal cell carcinoma of skin of right lower limb, including hip: Secondary | ICD-10-CM | POA: Diagnosis not present

## 2018-07-30 DIAGNOSIS — L821 Other seborrheic keratosis: Secondary | ICD-10-CM | POA: Diagnosis not present

## 2018-07-30 DIAGNOSIS — L570 Actinic keratosis: Secondary | ICD-10-CM | POA: Diagnosis not present

## 2018-07-30 DIAGNOSIS — L812 Freckles: Secondary | ICD-10-CM | POA: Diagnosis not present

## 2018-07-30 DIAGNOSIS — Z85828 Personal history of other malignant neoplasm of skin: Secondary | ICD-10-CM | POA: Diagnosis not present

## 2018-08-22 ENCOUNTER — Other Ambulatory Visit: Payer: Self-pay | Admitting: Internal Medicine

## 2018-09-06 ENCOUNTER — Ambulatory Visit (INDEPENDENT_AMBULATORY_CARE_PROVIDER_SITE_OTHER): Payer: Medicare Other | Admitting: General Practice

## 2018-09-06 ENCOUNTER — Other Ambulatory Visit: Payer: Self-pay

## 2018-09-06 DIAGNOSIS — Z7901 Long term (current) use of anticoagulants: Secondary | ICD-10-CM | POA: Diagnosis not present

## 2018-09-06 DIAGNOSIS — Z86718 Personal history of other venous thrombosis and embolism: Secondary | ICD-10-CM

## 2018-09-06 LAB — POCT INR: INR: 2.8 (ref 2.0–3.0)

## 2018-09-06 NOTE — Patient Instructions (Addendum)
Pre visit review using our clinic review tool, if applicable. No additional management support is needed unless otherwise documented below in the visit note.  Continue to take 1 (5 mg) tablet daily.  Re-check in 6 weeks.

## 2018-09-19 ENCOUNTER — Other Ambulatory Visit: Payer: Self-pay | Admitting: Internal Medicine

## 2018-09-19 DIAGNOSIS — Z7901 Long term (current) use of anticoagulants: Secondary | ICD-10-CM

## 2018-10-23 DIAGNOSIS — Z79899 Other long term (current) drug therapy: Secondary | ICD-10-CM | POA: Diagnosis not present

## 2018-10-23 DIAGNOSIS — Z87891 Personal history of nicotine dependence: Secondary | ICD-10-CM | POA: Diagnosis not present

## 2018-10-23 DIAGNOSIS — C73 Malignant neoplasm of thyroid gland: Secondary | ICD-10-CM | POA: Diagnosis not present

## 2018-10-23 DIAGNOSIS — Z86718 Personal history of other venous thrombosis and embolism: Secondary | ICD-10-CM | POA: Diagnosis not present

## 2018-10-23 DIAGNOSIS — Z7901 Long term (current) use of anticoagulants: Secondary | ICD-10-CM | POA: Diagnosis not present

## 2018-10-25 ENCOUNTER — Ambulatory Visit (INDEPENDENT_AMBULATORY_CARE_PROVIDER_SITE_OTHER): Payer: Medicare Other | Admitting: General Practice

## 2018-10-25 ENCOUNTER — Other Ambulatory Visit: Payer: Self-pay

## 2018-10-25 DIAGNOSIS — Z7901 Long term (current) use of anticoagulants: Secondary | ICD-10-CM

## 2018-10-25 DIAGNOSIS — Z86718 Personal history of other venous thrombosis and embolism: Secondary | ICD-10-CM

## 2018-10-25 LAB — POCT INR: INR: 2.2 (ref 2.0–3.0)

## 2018-10-25 NOTE — Patient Instructions (Addendum)
Pre visit review using our clinic review tool, if applicable. No additional management support is needed unless otherwise documented below in the visit note.  Continue to take 1 (5 mg) tablet daily.  Re-check in 6 weeks.

## 2018-10-30 DIAGNOSIS — D1801 Hemangioma of skin and subcutaneous tissue: Secondary | ICD-10-CM | POA: Diagnosis not present

## 2018-10-30 DIAGNOSIS — L820 Inflamed seborrheic keratosis: Secondary | ICD-10-CM | POA: Diagnosis not present

## 2018-10-30 DIAGNOSIS — L570 Actinic keratosis: Secondary | ICD-10-CM | POA: Diagnosis not present

## 2018-10-30 DIAGNOSIS — D485 Neoplasm of uncertain behavior of skin: Secondary | ICD-10-CM | POA: Diagnosis not present

## 2018-10-30 DIAGNOSIS — L821 Other seborrheic keratosis: Secondary | ICD-10-CM | POA: Diagnosis not present

## 2018-10-30 DIAGNOSIS — C44719 Basal cell carcinoma of skin of left lower limb, including hip: Secondary | ICD-10-CM | POA: Diagnosis not present

## 2018-10-30 DIAGNOSIS — C44712 Basal cell carcinoma of skin of right lower limb, including hip: Secondary | ICD-10-CM | POA: Diagnosis not present

## 2018-10-30 DIAGNOSIS — C4441 Basal cell carcinoma of skin of scalp and neck: Secondary | ICD-10-CM | POA: Diagnosis not present

## 2018-10-30 DIAGNOSIS — Z85820 Personal history of malignant melanoma of skin: Secondary | ICD-10-CM | POA: Diagnosis not present

## 2018-10-30 DIAGNOSIS — Z85828 Personal history of other malignant neoplasm of skin: Secondary | ICD-10-CM | POA: Diagnosis not present

## 2018-11-04 DIAGNOSIS — Z23 Encounter for immunization: Secondary | ICD-10-CM | POA: Diagnosis not present

## 2018-11-15 DIAGNOSIS — C73 Malignant neoplasm of thyroid gland: Secondary | ICD-10-CM | POA: Diagnosis not present

## 2018-12-06 ENCOUNTER — Other Ambulatory Visit: Payer: Self-pay

## 2018-12-06 ENCOUNTER — Ambulatory Visit (INDEPENDENT_AMBULATORY_CARE_PROVIDER_SITE_OTHER): Payer: Medicare Other | Admitting: General Practice

## 2018-12-06 DIAGNOSIS — Z7901 Long term (current) use of anticoagulants: Secondary | ICD-10-CM

## 2018-12-06 DIAGNOSIS — Z86718 Personal history of other venous thrombosis and embolism: Secondary | ICD-10-CM

## 2018-12-06 LAB — POCT INR: INR: 3.3 — AB (ref 2.0–3.0)

## 2018-12-06 NOTE — Progress Notes (Signed)
Medical screening examination/treatment/procedure(s) were performed by non-physician practitioner and as supervising physician I was immediately available for consultation/collaboration. I agree with above. Briant Angelillo, MD   

## 2018-12-06 NOTE — Patient Instructions (Addendum)
Pre visit review using our clinic review tool, if applicable. No additional management support is needed unless otherwise documented below in the visit note.  Continue to take 1 (5 mg) tablet daily.  Re-check in 4 weeks.

## 2019-01-03 ENCOUNTER — Ambulatory Visit (INDEPENDENT_AMBULATORY_CARE_PROVIDER_SITE_OTHER): Payer: Medicare Other | Admitting: General Practice

## 2019-01-03 ENCOUNTER — Other Ambulatory Visit: Payer: Self-pay

## 2019-01-03 DIAGNOSIS — Z7901 Long term (current) use of anticoagulants: Secondary | ICD-10-CM

## 2019-01-03 DIAGNOSIS — Z86718 Personal history of other venous thrombosis and embolism: Secondary | ICD-10-CM

## 2019-01-03 LAB — POCT INR: INR: 2.3 (ref 2.0–3.0)

## 2019-01-03 NOTE — Patient Instructions (Signed)
Pre visit review using our clinic review tool, if applicable. No additional management support is needed unless otherwise documented below in the visit note.  Continue to take 1 (5 mg) tablet daily.  Re-check in 4 weeks.

## 2019-01-03 NOTE — Progress Notes (Signed)
Medical screening examination/treatment/procedure(s) were performed by non-physician practitioner and as supervising physician I was immediately available for consultation/collaboration. I agree with above. James John, MD   

## 2019-01-29 DIAGNOSIS — Z85820 Personal history of malignant melanoma of skin: Secondary | ICD-10-CM | POA: Diagnosis not present

## 2019-01-29 DIAGNOSIS — L821 Other seborrheic keratosis: Secondary | ICD-10-CM | POA: Diagnosis not present

## 2019-01-29 DIAGNOSIS — Z85828 Personal history of other malignant neoplasm of skin: Secondary | ICD-10-CM | POA: Diagnosis not present

## 2019-01-29 DIAGNOSIS — L570 Actinic keratosis: Secondary | ICD-10-CM | POA: Diagnosis not present

## 2019-01-29 DIAGNOSIS — D1801 Hemangioma of skin and subcutaneous tissue: Secondary | ICD-10-CM | POA: Diagnosis not present

## 2019-01-31 ENCOUNTER — Ambulatory Visit (INDEPENDENT_AMBULATORY_CARE_PROVIDER_SITE_OTHER): Payer: Medicare Other | Admitting: General Practice

## 2019-01-31 ENCOUNTER — Other Ambulatory Visit: Payer: Self-pay

## 2019-01-31 DIAGNOSIS — Z86718 Personal history of other venous thrombosis and embolism: Secondary | ICD-10-CM

## 2019-01-31 DIAGNOSIS — Z7901 Long term (current) use of anticoagulants: Secondary | ICD-10-CM

## 2019-01-31 LAB — POCT INR: INR: 1.8 — AB (ref 2.0–3.0)

## 2019-01-31 NOTE — Progress Notes (Signed)
Medical screening examination/treatment/procedure(s) were performed by non-physician practitioner and as supervising physician I was immediately available for consultation/collaboration. I agree with above. Suhana Wilner, MD   

## 2019-01-31 NOTE — Patient Instructions (Addendum)
Pre visit review using our clinic review tool, if applicable. No additional management support is needed unless otherwise documented below in the visit note.  Take 1 1/2 tablets today (12/18) and then continue to take 1 (5 mg) tablet daily.  Re-check in 4 weeks.

## 2019-02-28 ENCOUNTER — Other Ambulatory Visit: Payer: Self-pay

## 2019-02-28 ENCOUNTER — Ambulatory Visit (INDEPENDENT_AMBULATORY_CARE_PROVIDER_SITE_OTHER): Payer: Medicare Other | Admitting: General Practice

## 2019-02-28 DIAGNOSIS — Z86718 Personal history of other venous thrombosis and embolism: Secondary | ICD-10-CM

## 2019-02-28 DIAGNOSIS — Z7901 Long term (current) use of anticoagulants: Secondary | ICD-10-CM | POA: Diagnosis not present

## 2019-02-28 LAB — POCT INR: INR: 2.6 (ref 2.0–3.0)

## 2019-02-28 NOTE — Patient Instructions (Signed)
.  lbpcmh  Continue to take 1 (5 mg) tablet daily.  Re-check in 4 weeks.

## 2019-02-28 NOTE — Progress Notes (Signed)
Medical screening examination/treatment/procedure(s) were performed by non-physician practitioner and as supervising physician I was immediately available for consultation/collaboration. I agree with above. Elianah Karis, MD   

## 2019-03-02 ENCOUNTER — Ambulatory Visit: Payer: Medicare Other | Attending: Internal Medicine

## 2019-03-02 DIAGNOSIS — Z23 Encounter for immunization: Secondary | ICD-10-CM | POA: Insufficient documentation

## 2019-03-02 NOTE — Progress Notes (Signed)
   Covid-19 Vaccination Clinic  Name:  Clarence Hopkins    MRN: [id number] DOB: 1930/06/08  03/02/2019  Mr. Blansett was observed post Covid-19 immunization for 15 minutes without incidence. He was provided with Vaccine Information Sheet and instruction to access the V-Safe system.   Mr. Fitzsimmons was instructed to call 911 with any severe reactions post vaccine: Marland Kitchen Difficulty breathing  . Swelling of your face and throat  . A fast heartbeat  . A bad rash all over your body  . Dizziness and weakness

## 2019-03-10 ENCOUNTER — Encounter: Payer: Self-pay | Admitting: Internal Medicine

## 2019-03-10 ENCOUNTER — Ambulatory Visit (INDEPENDENT_AMBULATORY_CARE_PROVIDER_SITE_OTHER): Payer: Medicare Other | Admitting: Internal Medicine

## 2019-03-10 ENCOUNTER — Other Ambulatory Visit: Payer: Self-pay

## 2019-03-10 VITALS — BP 144/86 | HR 61 | Temp 98.2°F | Ht 75.0 in | Wt 213.0 lb

## 2019-03-10 DIAGNOSIS — E781 Pure hyperglyceridemia: Secondary | ICD-10-CM

## 2019-03-10 DIAGNOSIS — R972 Elevated prostate specific antigen [PSA]: Secondary | ICD-10-CM

## 2019-03-10 DIAGNOSIS — Z7901 Long term (current) use of anticoagulants: Secondary | ICD-10-CM

## 2019-03-10 DIAGNOSIS — Z85828 Personal history of other malignant neoplasm of skin: Secondary | ICD-10-CM

## 2019-03-10 DIAGNOSIS — D6859 Other primary thrombophilia: Secondary | ICD-10-CM | POA: Diagnosis not present

## 2019-03-10 DIAGNOSIS — C73 Malignant neoplasm of thyroid gland: Secondary | ICD-10-CM | POA: Diagnosis not present

## 2019-03-10 NOTE — Assessment & Plan Note (Signed)
F/u q 90 d for melanoma

## 2019-03-10 NOTE — Assessment & Plan Note (Signed)
On Coumadin 

## 2019-03-10 NOTE — Assessment & Plan Note (Signed)
PSA

## 2019-03-10 NOTE — Progress Notes (Signed)
Subjective:  Patient ID: Clarence Hopkins, male    DOB: [birth year]  Age: 86 y.o. MRN: [id number]  CC: No chief complaint on file.   HPI [facility] presents for thyroid cancer, anticoagulation, hypothyroidism f/u C/o choking on food at times - he went to [facility] - ok  Outpatient Medications Prior to Visit  Medication Sig Dispense Refill  . b complex vitamins tablet Take 1 tablet by mouth daily. 100 tablet 3  . diphenhydramine-acetaminophen (TYLENOL PM) 25-500 MG TABS tablet Take 1 tablet by mouth at bedtime.    . famotidine (PEPCID) 40 MG tablet Take 1 tablet (40 mg total) by mouth daily. 30 tablet 11  . tamsulosin (FLOMAX) 0.4 MG CAPS capsule TAKE 1 CAPSULE BY MOUTH DAILY AFTER SUPPER 90 capsule 2  . warfarin (COUMADIN) 5 MG tablet Take 1 tablet daily or as directed by anticoagulation clinic 100 tablet 1  . levothyroxine (SYNTHROID, LEVOTHROID) 175 MCG tablet Take 1 tablet by mouth daily.    Marland Kitchen erythromycin ophthalmic ointment Place 1 application into both eyes at bedtime. (Patient not taking: Reported on 04/30/2018) 3.5 g 3   No facility-administered medications prior to visit.    ROS: Review of Systems  Constitutional: Negative for appetite change, fatigue and unexpected weight change.  HENT: Negative for congestion, nosebleeds, sneezing, sore throat and trouble swallowing.   Eyes: Negative for itching and visual disturbance.  Respiratory: Negative for cough and shortness of breath.   Cardiovascular: Negative for chest pain, palpitations and leg swelling.  Gastrointestinal: Negative for abdominal distention, blood in stool, diarrhea, nausea and vomiting.  Genitourinary: Negative for frequency and hematuria.  Musculoskeletal: Negative for back pain, gait problem, joint swelling and neck pain.  Skin: Negative for rash.  Neurological: Negative for dizziness, tremors, speech difficulty and weakness.  Psychiatric/Behavioral: Negative for agitation, dysphoric mood, sleep disturbance and  suicidal ideas. The patient is not nervous/anxious.     Objective:  BP (!) 144/86 (BP Location: Left Arm, Patient Position: Sitting, Cuff Size: Large)   Pulse 61   Temp 98.2 F (36.8 C) (Oral)   Ht 6\' 3"  (1.905 m)   Wt 213 lb (96.6 kg)   SpO2 95%   BMI 26.62 kg/m   BP Readings from Last 3 Encounters:  03/10/19 (!) 144/86  04/30/18 (!) 164/93  03/07/18 132/78    Wt Readings from Last 3 Encounters:  03/10/19 213 lb (96.6 kg)  04/30/18 202 lb (91.6 kg)  03/07/18 204 lb (92.5 kg)    Physical Exam Constitutional:      General: He is not in acute distress.    Appearance: He is well-developed.     Comments: NAD  Eyes:     Conjunctiva/sclera: Conjunctivae normal.     Pupils: Pupils are equal, round, and reactive to light.  Neck:     Thyroid: No thyromegaly.     Vascular: No JVD.  Cardiovascular:     Rate and Rhythm: Normal rate and regular rhythm.     Heart sounds: Normal heart sounds. No murmur. No friction rub. No gallop.   Pulmonary:     Effort: Pulmonary effort is normal. No respiratory distress.     Breath sounds: Normal breath sounds. No wheezing or rales.  Chest:     Chest wall: No tenderness.  Abdominal:     General: Bowel sounds are normal. There is no distension.     Palpations: Abdomen is soft. There is no mass.     Tenderness: There is no abdominal tenderness.  There is no guarding or rebound.  Musculoskeletal:        General: No tenderness. Normal range of motion.     Cervical back: Normal range of motion.  Lymphadenopathy:     Cervical: No cervical adenopathy.  Skin:    General: Skin is warm and dry.     Findings: No rash.  Neurological:     Mental Status: He is alert and oriented to person, place, and time.     Cranial Nerves: No cranial nerve deficit.     Motor: No abnormal muscle tone.     Coordination: Coordination normal.     Gait: Gait normal.     Deep Tendon Reflexes: Reflexes are normal and symmetric.  Psychiatric:        Behavior:  Behavior normal.        Thought Content: Thought content normal.        Judgment: Judgment normal.   scars on face and ears  Lab Results  Component Value Date   WBC 5.6 04/30/2018   HGB 13.3 04/30/2018   HCT 44.3 04/30/2018   PLT 174 04/30/2018   GLUCOSE 95 04/30/2018   CHOL 136 03/07/2018   TRIG 72.0 03/07/2018   HDL 38.30 (L) 03/07/2018   LDLDIRECT 116.0 03/31/2013   LDLCALC 84 03/07/2018   ALT 17 03/07/2018   AST 20 03/07/2018   NA 139 04/30/2018   K 4.7 04/30/2018   CL 106 04/30/2018   CREATININE 0.92 04/30/2018   BUN 18 04/30/2018   CO2 28 04/30/2018   TSH 1.433 04/30/2018   PSA 8.06 (H) 03/07/2018   INR 2.6 02/28/2019    DG Chest 2 View  Result Date: 04/30/2018 CLINICAL DATA:  Chest pain EXAM: CHEST - 2 VIEW COMPARISON:  02/15/2007 FINDINGS: The heart size and mediastinal contours are within normal limits. No acute airspace opacity. Masslike opacities of the infrahilar right lung and right lung base. The visualized skeletal structures are unremarkable. IMPRESSION: No acute airspace opacity. Masslike opacities of the infrahilar right lung and right lung base. Recommend CT to further evaluate. Electronically Signed   By: Eddie Candle M.D.   On: 04/30/2018 11:53    Assessment & Plan:    Walker Kehr, MD

## 2019-03-10 NOTE — Assessment & Plan Note (Signed)
F/u [facility]

## 2019-03-12 ENCOUNTER — Other Ambulatory Visit: Payer: Medicare Other

## 2019-03-13 ENCOUNTER — Other Ambulatory Visit: Payer: Self-pay

## 2019-03-13 ENCOUNTER — Other Ambulatory Visit: Payer: Medicare Other

## 2019-03-14 ENCOUNTER — Other Ambulatory Visit (INDEPENDENT_AMBULATORY_CARE_PROVIDER_SITE_OTHER): Payer: Medicare Other

## 2019-03-14 DIAGNOSIS — E781 Pure hyperglyceridemia: Secondary | ICD-10-CM | POA: Diagnosis not present

## 2019-03-14 DIAGNOSIS — C73 Malignant neoplasm of thyroid gland: Secondary | ICD-10-CM | POA: Diagnosis not present

## 2019-03-14 DIAGNOSIS — R972 Elevated prostate specific antigen [PSA]: Secondary | ICD-10-CM

## 2019-03-14 LAB — BASIC METABOLIC PANEL WITH GFR
BUN: 16 mg/dL (ref 6–23)
CO2: 32 meq/L (ref 19–32)
Calcium: 9.1 mg/dL (ref 8.4–10.5)
Chloride: 103 meq/L (ref 96–112)
Creatinine, Ser: 1.03 mg/dL (ref 0.40–1.50)
GFR: 68.4 mL/min
Glucose, Bld: 87 mg/dL (ref 70–99)
Potassium: 4.5 meq/L (ref 3.5–5.1)
Sodium: 139 meq/L (ref 135–145)

## 2019-03-14 LAB — LIPID PANEL
Cholesterol: 141 mg/dL (ref 0–200)
HDL: 38 mg/dL — ABNORMAL LOW (ref >39.00)
LDL Cholesterol: 88 mg/dL (ref 0–99)
NonHDL: 102.67
Total CHOL/HDL Ratio: 4
Triglycerides: 74 mg/dL (ref 0.0–149.0)
VLDL: 14.8 mg/dL (ref 0.0–40.0)

## 2019-03-14 LAB — CBC WITH DIFFERENTIAL/PLATELET
Basophils Absolute: 0 K/uL (ref 0.0–0.1)
Basophils Relative: 0.4 % (ref 0.0–3.0)
Eosinophils Absolute: 0.2 K/uL (ref 0.0–0.7)
Eosinophils Relative: 3.4 % (ref 0.0–5.0)
HCT: 41.2 % (ref 39.0–52.0)
Hemoglobin: 13.6 g/dL (ref 13.0–17.0)
Lymphocytes Relative: 29.6 % (ref 12.0–46.0)
Lymphs Abs: 1.3 K/uL (ref 0.7–4.0)
MCHC: 32.9 g/dL (ref 30.0–36.0)
MCV: 86.3 fl (ref 78.0–100.0)
Monocytes Absolute: 0.5 K/uL (ref 0.1–1.0)
Monocytes Relative: 11.6 % (ref 3.0–12.0)
Neutro Abs: 2.5 K/uL (ref 1.4–7.7)
Neutrophils Relative %: 55 % (ref 43.0–77.0)
Platelets: 176 K/uL (ref 150.0–400.0)
RBC: 4.78 Mil/uL (ref 4.22–5.81)
RDW: 14 % (ref 11.5–15.5)
WBC: 4.5 K/uL (ref 4.0–10.5)

## 2019-03-14 LAB — HEPATIC FUNCTION PANEL
ALT: 14 U/L (ref 0–53)
AST: 18 U/L (ref 0–37)
Albumin: 3.9 g/dL (ref 3.5–5.2)
Alkaline Phosphatase: 74 U/L (ref 39–117)
Bilirubin, Direct: 0.2 mg/dL (ref 0.0–0.3)
Total Bilirubin: 0.7 mg/dL (ref 0.2–1.2)
Total Protein: 6.8 g/dL (ref 6.0–8.3)

## 2019-03-14 LAB — URINALYSIS
Bilirubin Urine: NEGATIVE
Hgb urine dipstick: NEGATIVE
Ketones, ur: NEGATIVE
Leukocytes,Ua: NEGATIVE
Nitrite: NEGATIVE
Specific Gravity, Urine: 1.02 (ref 1.000–1.030)
Total Protein, Urine: NEGATIVE
Urine Glucose: NEGATIVE
Urobilinogen, UA: 0.2 (ref 0.0–1.0)
pH: 5.5 (ref 5.0–8.0)

## 2019-03-14 LAB — PSA: PSA: 9.86 ng/mL — ABNORMAL HIGH (ref 0.10–4.00)

## 2019-03-14 LAB — TSH: TSH: 1.06 u[IU]/mL (ref 0.35–4.50)

## 2019-03-19 ENCOUNTER — Ambulatory Visit: Payer: Medicare Other | Attending: Internal Medicine

## 2019-03-19 DIAGNOSIS — Z23 Encounter for immunization: Secondary | ICD-10-CM

## 2019-03-19 NOTE — Progress Notes (Signed)
   Covid-19 Vaccination Clinic  Name:  Clarence Hopkins    MRN: [id number] DOB: May 30, 1930  03/19/2019  [name] was observed post Covid-19 immunization for 15 minutes without incidence. He was provided with Vaccine Information Sheet and instruction to access the V-Safe system.   [name] was instructed to call 911 with any severe reactions post vaccine: Marland Kitchen Difficulty breathing  . Swelling of your face and throat  . A fast heartbeat  . A bad rash all over your body  . Dizziness and weakness    Immunizations Administered    Name Date Dose VIS Date Route   Pfizer COVID-19 Vaccine 03/19/2019  8:15 AM 0.3 mL 01/24/2019 Intramuscular   Manufacturer: [place]   Lot: [id number]   [place]: [id number]

## 2019-03-28 ENCOUNTER — Ambulatory Visit: Payer: Medicare Other

## 2019-04-01 ENCOUNTER — Other Ambulatory Visit: Payer: Self-pay

## 2019-04-01 ENCOUNTER — Ambulatory Visit (INDEPENDENT_AMBULATORY_CARE_PROVIDER_SITE_OTHER): Payer: Medicare Other | Admitting: General Practice

## 2019-04-01 DIAGNOSIS — Z7901 Long term (current) use of anticoagulants: Secondary | ICD-10-CM | POA: Diagnosis not present

## 2019-04-01 DIAGNOSIS — Z86718 Personal history of other venous thrombosis and embolism: Secondary | ICD-10-CM

## 2019-04-01 LAB — POCT INR: INR: 2.4 (ref 2.0–3.0)

## 2019-04-01 NOTE — Patient Instructions (Addendum)
Pre visit review using our clinic review tool, if applicable. No additional management support is needed unless otherwise documented below in the visit note.  Continue to take 1 (5 mg) tablet daily.  Re-check in 6 weeks.

## 2019-04-08 ENCOUNTER — Other Ambulatory Visit: Payer: Self-pay | Admitting: Internal Medicine

## 2019-04-08 DIAGNOSIS — Z7901 Long term (current) use of anticoagulants: Secondary | ICD-10-CM

## 2019-04-30 DIAGNOSIS — D1801 Hemangioma of skin and subcutaneous tissue: Secondary | ICD-10-CM | POA: Diagnosis not present

## 2019-04-30 DIAGNOSIS — Z85828 Personal history of other malignant neoplasm of skin: Secondary | ICD-10-CM | POA: Diagnosis not present

## 2019-04-30 DIAGNOSIS — L821 Other seborrheic keratosis: Secondary | ICD-10-CM | POA: Diagnosis not present

## 2019-04-30 DIAGNOSIS — Z85820 Personal history of malignant melanoma of skin: Secondary | ICD-10-CM | POA: Diagnosis not present

## 2019-04-30 DIAGNOSIS — L570 Actinic keratosis: Secondary | ICD-10-CM | POA: Diagnosis not present

## 2019-05-13 ENCOUNTER — Ambulatory Visit (INDEPENDENT_AMBULATORY_CARE_PROVIDER_SITE_OTHER): Payer: Medicare Other | Admitting: General Practice

## 2019-05-13 ENCOUNTER — Other Ambulatory Visit: Payer: Self-pay

## 2019-05-13 DIAGNOSIS — Z7901 Long term (current) use of anticoagulants: Secondary | ICD-10-CM | POA: Diagnosis not present

## 2019-05-13 DIAGNOSIS — Z86718 Personal history of other venous thrombosis and embolism: Secondary | ICD-10-CM

## 2019-05-13 LAB — POCT INR: INR: 2.5 (ref 2.0–3.0)

## 2019-05-13 NOTE — Patient Instructions (Addendum)
Pre visit review using our clinic review tool, if applicable. No additional management support is needed unless otherwise documented below in the visit note.  Continue to take 1 (5 mg) tablet daily.  Re-check in 6 weeks.

## 2019-05-19 ENCOUNTER — Other Ambulatory Visit: Payer: Self-pay | Admitting: Internal Medicine

## 2019-06-03 ENCOUNTER — Encounter: Payer: Self-pay | Admitting: Internal Medicine

## 2019-06-03 ENCOUNTER — Ambulatory Visit (INDEPENDENT_AMBULATORY_CARE_PROVIDER_SITE_OTHER): Payer: Medicare Other | Admitting: Internal Medicine

## 2019-06-03 ENCOUNTER — Other Ambulatory Visit: Payer: Self-pay

## 2019-06-03 VITALS — BP 140/80 | HR 61 | Temp 97.8°F | Ht 75.0 in | Wt 212.0 lb

## 2019-06-03 DIAGNOSIS — R0600 Dyspnea, unspecified: Secondary | ICD-10-CM

## 2019-06-03 DIAGNOSIS — R0609 Other forms of dyspnea: Secondary | ICD-10-CM | POA: Insufficient documentation

## 2019-06-03 DIAGNOSIS — C73 Malignant neoplasm of thyroid gland: Secondary | ICD-10-CM

## 2019-06-03 DIAGNOSIS — R1313 Dysphagia, pharyngeal phase: Secondary | ICD-10-CM | POA: Diagnosis not present

## 2019-06-03 DIAGNOSIS — R55 Syncope and collapse: Secondary | ICD-10-CM | POA: Diagnosis not present

## 2019-06-03 DIAGNOSIS — E559 Vitamin D deficiency, unspecified: Secondary | ICD-10-CM

## 2019-06-03 DIAGNOSIS — R202 Paresthesia of skin: Secondary | ICD-10-CM

## 2019-06-03 LAB — CBC WITH DIFFERENTIAL/PLATELET
Basophils Absolute: 0 10*3/uL (ref 0.0–0.1)
Basophils Relative: 0.6 % (ref 0.0–3.0)
Eosinophils Absolute: 0.2 10*3/uL (ref 0.0–0.7)
Eosinophils Relative: 3.6 % (ref 0.0–5.0)
HCT: 41.6 % (ref 39.0–52.0)
Hemoglobin: 13.5 g/dL (ref 13.0–17.0)
Lymphocytes Relative: 27.6 % (ref 12.0–46.0)
Lymphs Abs: 1.5 10*3/uL (ref 0.7–4.0)
MCHC: 32.4 g/dL (ref 30.0–36.0)
MCV: 86.8 fl (ref 78.0–100.0)
Monocytes Absolute: 0.6 10*3/uL (ref 0.1–1.0)
Monocytes Relative: 10.5 % (ref 3.0–12.0)
Neutro Abs: 3.2 10*3/uL (ref 1.4–7.7)
Neutrophils Relative %: 57.7 % (ref 43.0–77.0)
Platelets: 189 10*3/uL (ref 150.0–400.0)
RBC: 4.8 Mil/uL (ref 4.22–5.81)
RDW: 13.8 % (ref 11.5–15.5)
WBC: 5.6 10*3/uL (ref 4.0–10.5)

## 2019-06-03 LAB — BASIC METABOLIC PANEL
BUN: 19 mg/dL (ref 6–23)
CO2: 31 mEq/L (ref 19–32)
Calcium: 9 mg/dL (ref 8.4–10.5)
Chloride: 103 mEq/L (ref 96–112)
Creatinine, Ser: 1.15 mg/dL (ref 0.40–1.50)
GFR: 60.2 mL/min (ref >60.00)
Glucose, Bld: 95 mg/dL (ref 70–99)
Potassium: 4.6 mEq/L (ref 3.5–5.1)
Sodium: 138 mEq/L (ref 135–145)

## 2019-06-03 LAB — HEPATIC FUNCTION PANEL
ALT: 14 U/L (ref 0–53)
AST: 19 U/L (ref 0–37)
Albumin: 3.9 g/dL (ref 3.5–5.2)
Alkaline Phosphatase: 66 U/L (ref 39–117)
Bilirubin, Direct: 0.1 mg/dL (ref 0.0–0.3)
Total Bilirubin: 0.5 mg/dL (ref 0.2–1.2)
Total Protein: 6.9 g/dL (ref 6.0–8.3)

## 2019-06-03 LAB — T4, FREE: Free T4: 1.29 ng/dL (ref 0.60–1.60)

## 2019-06-03 LAB — VITAMIN D 25 HYDROXY (VIT D DEFICIENCY, FRACTURES): VITD: 81.58 ng/mL (ref 30.00–100.00)

## 2019-06-03 LAB — VITAMIN B12: Vitamin B-12: 1126 pg/mL — ABNORMAL HIGH (ref 211–911)

## 2019-06-03 LAB — BRAIN NATRIURETIC PEPTIDE: Pro B Natriuretic peptide (BNP): 103 pg/mL — ABNORMAL HIGH (ref 0.0–100.0)

## 2019-06-03 LAB — CORTISOL: Cortisol, Plasma: 4.4 ug/dL

## 2019-06-03 LAB — TSH: TSH: 0.67 u[IU]/mL (ref 0.35–4.50)

## 2019-06-03 NOTE — Assessment & Plan Note (Addendum)
?  etiology EKG CT chest/neck ECHO Labs

## 2019-06-03 NOTE — Assessment & Plan Note (Signed)
?  etiology EKG CT chest/neck ECHO Labs

## 2019-06-03 NOTE — Assessment & Plan Note (Addendum)
CT neck/chest F/u w/dr Ronnald Ramp

## 2019-06-03 NOTE — Progress Notes (Signed)
Subjective:  Patient ID: Clarence Hopkins, male    DOB: [birth year]  Age: 86 y.o. MRN: [id number]  CC: No chief complaint on file.   HPI Clarence Hopkins presents for lightheadedness, worsening DOE since Jan 2021. No LOC CT chest is planned for May - Dr Sharlynn Oliphant [facility] C/o choking on food - worse   Outpatient Medications Prior to Visit  Medication Sig Dispense Refill  . b complex vitamins tablet Take 1 tablet by mouth daily. 100 tablet 3  . diphenhydramine-acetaminophen (TYLENOL PM) 25-500 MG TABS tablet Take 1 tablet by mouth at bedtime.    . famotidine (PEPCID) 40 MG tablet Take 1 tablet (40 mg total) by mouth daily. 30 tablet 11  . tamsulosin (FLOMAX) 0.4 MG CAPS capsule TAKE 1 CAPSULE BY MOUTH DAILY AFTER SUPPER 90 capsule 3  . warfarin (COUMADIN) 5 MG tablet TAKE 1 TABLET DAILY OR AS DIRECTED BY ANTICOAGULATION CLINIC 100 tablet 1  . levothyroxine (SYNTHROID, LEVOTHROID) 175 MCG tablet Take 1 tablet by mouth daily.     No facility-administered medications prior to visit.    ROS: Review of Systems  Constitutional: Positive for fatigue. Negative for appetite change and unexpected weight change.  HENT: Negative for congestion, nosebleeds, sneezing, sore throat and trouble swallowing.   Eyes: Negative for itching and visual disturbance.  Respiratory: Positive for shortness of breath. Negative for cough.   Cardiovascular: Negative for chest pain, palpitations and leg swelling.  Gastrointestinal: Negative for abdominal distention, blood in stool, diarrhea and nausea.  Genitourinary: Negative for frequency and hematuria.  Musculoskeletal: Negative for back pain, gait problem, joint swelling and neck pain.  Skin: Negative for rash.  Neurological: Positive for weakness and light-headedness. Negative for dizziness, tremors, facial asymmetry and speech difficulty.  Psychiatric/Behavioral: Negative for agitation, dysphoric mood and sleep disturbance. The patient is not nervous/anxious.      Objective:  BP 140/80 (BP Location: Left Arm, Patient Position: Sitting, Cuff Size: Large)   Pulse 61   Temp 97.8 F (36.6 C) (Oral)   Ht 6\' 3"  (1.905 m)   Wt 212 lb (96.2 kg)   SpO2 95%   BMI 26.50 kg/m   BP Readings from Last 3 Encounters:  06/03/19 140/80  03/10/19 (!) 144/86  04/30/18 (!) 164/93    Wt Readings from Last 3 Encounters:  06/03/19 212 lb (96.2 kg)  03/10/19 213 lb (96.6 kg)  04/30/18 202 lb (91.6 kg)    Physical Exam Constitutional:      General: He is not in acute distress.    Appearance: He is well-developed.     Comments: NAD  Eyes:     Conjunctiva/sclera: Conjunctivae normal.     Pupils: Pupils are equal, round, and reactive to light.  Neck:     Thyroid: No thyromegaly.     Vascular: No JVD.  Cardiovascular:     Rate and Rhythm: Normal rate and regular rhythm.     Heart sounds: Normal heart sounds. No murmur. No friction rub. No gallop.   Pulmonary:     Effort: Pulmonary effort is normal. No respiratory distress.     Breath sounds: Normal breath sounds. No wheezing or rales.  Chest:     Chest wall: No tenderness.  Abdominal:     General: Bowel sounds are normal. There is no distension.     Palpations: Abdomen is soft. There is no mass.     Tenderness: There is no abdominal tenderness. There is no guarding or rebound.  Musculoskeletal:  General: No tenderness. Normal range of motion.     Cervical back: Normal range of motion.  Lymphadenopathy:     Cervical: No cervical adenopathy.  Skin:    General: Skin is warm and dry.     Findings: No rash.  Neurological:     Mental Status: He is alert and oriented to person, place, and time.     Cranial Nerves: No cranial nerve deficit.     Motor: No abnormal muscle tone.     Coordination: Coordination normal.     Gait: Gait normal.     Deep Tendon Reflexes: Reflexes are normal and symmetric.  Psychiatric:        Behavior: Behavior normal.        Thought Content: Thought content normal.         Judgment: Judgment normal.   Procedure: EKG Indication: nearsyncope  Impression: NSR. No acute changes.  A complex case   Lab Results  Component Value Date   WBC 4.5 03/14/2019   HGB 13.6 03/14/2019   HCT 41.2 03/14/2019   PLT 176.0 03/14/2019   GLUCOSE 87 03/14/2019   CHOL 141 03/14/2019   TRIG 74.0 03/14/2019   HDL 38.00 (L) 03/14/2019   LDLDIRECT 116.0 03/31/2013   LDLCALC 88 03/14/2019   ALT 14 03/14/2019   AST 18 03/14/2019   NA 139 03/14/2019   K 4.5 03/14/2019   CL 103 03/14/2019   CREATININE 1.03 03/14/2019   BUN 16 03/14/2019   CO2 32 03/14/2019   TSH 1.06 03/14/2019   PSA 9.86 (H) 03/14/2019   INR 2.5 05/13/2019    DG Chest 2 View  Result Date: 04/30/2018 CLINICAL DATA:  Chest pain EXAM: CHEST - 2 VIEW COMPARISON:  02/15/2007 FINDINGS: The heart size and mediastinal contours are within normal limits. No acute airspace opacity. Masslike opacities of the infrahilar right lung and right lung base. The visualized skeletal structures are unremarkable. IMPRESSION: No acute airspace opacity. Masslike opacities of the infrahilar right lung and right lung base. Recommend CT to further evaluate. Electronically Signed   By: Eddie Candle M.D.   On: 04/30/2018 11:53    Assessment & Plan:   There are no diagnoses linked to this encounter.   No orders of the defined types were placed in this encounter.    Follow-up: No follow-ups on file.  Walker Kehr, MD

## 2019-06-03 NOTE — Assessment & Plan Note (Signed)
Worse CT chest/neck

## 2019-06-19 ENCOUNTER — Ambulatory Visit (HOSPITAL_COMMUNITY): Payer: Medicare Other | Attending: Cardiovascular Disease

## 2019-06-19 ENCOUNTER — Other Ambulatory Visit: Payer: Self-pay

## 2019-06-19 DIAGNOSIS — R0609 Other forms of dyspnea: Secondary | ICD-10-CM

## 2019-06-19 DIAGNOSIS — R55 Syncope and collapse: Secondary | ICD-10-CM | POA: Insufficient documentation

## 2019-06-19 DIAGNOSIS — R0600 Dyspnea, unspecified: Secondary | ICD-10-CM | POA: Insufficient documentation

## 2019-06-23 ENCOUNTER — Other Ambulatory Visit: Payer: Medicare Other

## 2019-06-24 ENCOUNTER — Ambulatory Visit: Payer: Medicare Other

## 2019-06-25 ENCOUNTER — Ambulatory Visit
Admission: RE | Admit: 2019-06-25 | Discharge: 2019-06-25 | Disposition: A | Discharge status: Home / Self Care | Payer: Medicare Other | Source: Ambulatory Visit | Attending: Internal Medicine | Admitting: Internal Medicine

## 2019-06-25 ENCOUNTER — Other Ambulatory Visit: Payer: Medicare Other

## 2019-06-25 DIAGNOSIS — R1313 Dysphagia, pharyngeal phase: Secondary | ICD-10-CM

## 2019-06-25 DIAGNOSIS — R0609 Other forms of dyspnea: Secondary | ICD-10-CM

## 2019-06-25 DIAGNOSIS — R918 Other nonspecific abnormal finding of lung field: Secondary | ICD-10-CM | POA: Diagnosis not present

## 2019-06-25 DIAGNOSIS — C73 Malignant neoplasm of thyroid gland: Secondary | ICD-10-CM | POA: Diagnosis not present

## 2019-06-25 MED ORDER — IOPAMIDOL (ISOVUE-300) INJECTION 61%
100.0000 mL | Freq: Once | INTRAVENOUS | Status: AC | PRN
  Administered 2019-06-25: 100 mL via INTRAVENOUS

## 2019-06-30 ENCOUNTER — Encounter: Payer: Self-pay | Admitting: *Deleted

## 2019-06-30 ENCOUNTER — Other Ambulatory Visit: Payer: Self-pay | Admitting: Internal Medicine

## 2019-06-30 DIAGNOSIS — C73 Malignant neoplasm of thyroid gland: Secondary | ICD-10-CM

## 2019-06-30 NOTE — Progress Notes (Signed)
Reached out to Lucinda Dell to introduce myself as the office RN Navigator and explain our new patient process. Reviewed the reason for their referral and scheduled their new patient appointment along with labs. Provided address and directions to the office including call back phone number. Reviewed with patient any concerns they may have or any possible barriers to attending their appointment.   Informed patient about my role as a navigator and that I will meet with them prior to their New Patient appointment and more fully discuss what services I can provide. At this time patient has no further questions or needs.

## 2019-07-01 ENCOUNTER — Ambulatory Visit: Payer: Medicare Other

## 2019-07-04 ENCOUNTER — Other Ambulatory Visit: Payer: Self-pay

## 2019-07-04 ENCOUNTER — Inpatient Hospital Stay (HOSPITAL_BASED_OUTPATIENT_CLINIC_OR_DEPARTMENT_OTHER): Payer: Medicare Other | Admitting: Hematology & Oncology

## 2019-07-04 ENCOUNTER — Encounter: Payer: Self-pay | Admitting: Hematology & Oncology

## 2019-07-04 ENCOUNTER — Inpatient Hospital Stay: Payer: Medicare Other | Attending: Hematology & Oncology

## 2019-07-04 VITALS — BP 121/66 | HR 59 | Temp 97.3°F | Resp 19 | Ht 75.0 in | Wt 203.0 lb

## 2019-07-04 DIAGNOSIS — C73 Malignant neoplasm of thyroid gland: Secondary | ICD-10-CM | POA: Diagnosis not present

## 2019-07-04 DIAGNOSIS — D6859 Other primary thrombophilia: Secondary | ICD-10-CM | POA: Diagnosis not present

## 2019-07-04 DIAGNOSIS — C7800 Secondary malignant neoplasm of unspecified lung: Secondary | ICD-10-CM | POA: Insufficient documentation

## 2019-07-04 DIAGNOSIS — Z85820 Personal history of malignant melanoma of skin: Secondary | ICD-10-CM

## 2019-07-04 DIAGNOSIS — Z7901 Long term (current) use of anticoagulants: Secondary | ICD-10-CM

## 2019-07-04 DIAGNOSIS — E039 Hypothyroidism, unspecified: Secondary | ICD-10-CM

## 2019-07-04 DIAGNOSIS — C787 Secondary malignant neoplasm of liver and intrahepatic bile duct: Secondary | ICD-10-CM | POA: Diagnosis not present

## 2019-07-04 DIAGNOSIS — Z86711 Personal history of pulmonary embolism: Secondary | ICD-10-CM | POA: Insufficient documentation

## 2019-07-04 DIAGNOSIS — D6959 Other secondary thrombocytopenia: Secondary | ICD-10-CM

## 2019-07-04 LAB — CBC WITH DIFFERENTIAL (CANCER CENTER ONLY)
Abs Immature Granulocytes: 0.01 10*3/uL (ref 0.00–0.07)
Basophils Absolute: 0 10*3/uL (ref 0.0–0.1)
Basophils Relative: 1 %
Eosinophils Absolute: 0.2 10*3/uL (ref 0.0–0.5)
Eosinophils Relative: 4 %
HCT: 41.7 % (ref 39.0–52.0)
Hemoglobin: 12.9 g/dL — ABNORMAL LOW (ref 13.0–17.0)
Immature Granulocytes: 0 %
Lymphocytes Relative: 28 %
Lymphs Abs: 1.5 10*3/uL (ref 0.7–4.0)
MCH: 27.6 pg (ref 26.0–34.0)
MCHC: 30.9 g/dL (ref 30.0–36.0)
MCV: 89.3 fL (ref 80.0–100.0)
Monocytes Absolute: 0.5 10*3/uL (ref 0.1–1.0)
Monocytes Relative: 9 %
Neutro Abs: 3.2 10*3/uL (ref 1.7–7.7)
Neutrophils Relative %: 58 %
Platelet Count: 192 10*3/uL (ref 150–400)
RBC: 4.67 MIL/uL (ref 4.22–5.81)
RDW: 12.8 % (ref 11.5–15.5)
WBC Count: 5.5 10*3/uL (ref 4.0–10.5)
nRBC: 0 % (ref 0.0–0.2)

## 2019-07-04 LAB — CMP (CANCER CENTER ONLY)
ALT: 17 U/L (ref 0–44)
AST: 22 U/L (ref 15–41)
Albumin: 3.7 g/dL (ref 3.5–5.0)
Alkaline Phosphatase: 65 U/L (ref 38–126)
Anion gap: 8 (ref 5–15)
BUN: 17 mg/dL (ref 8–23)
CO2: 28 mmol/L (ref 22–32)
Calcium: 8.7 mg/dL — ABNORMAL LOW (ref 8.9–10.3)
Chloride: 104 mmol/L (ref 98–111)
Creatinine: 0.98 mg/dL (ref 0.61–1.24)
GFR, Est AFR Am: >60 mL/min (ref >60)
GFR, Estimated: >60 mL/min (ref >60)
Glucose, Bld: 108 mg/dL — ABNORMAL HIGH (ref 70–99)
Potassium: 4.6 mmol/L (ref 3.5–5.1)
Sodium: 140 mmol/L (ref 135–145)
Total Bilirubin: 0.7 mg/dL (ref 0.3–1.2)
Total Protein: 6.6 g/dL (ref 6.5–8.1)

## 2019-07-05 NOTE — Progress Notes (Signed)
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 New Patient (Initial Visit)  : My thyroid cancer is worse.  HPI: [name] is a very nice 86-year-old white male.  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  He has a very interesting history.  There is a history of pulmonary embolism.  I see that he supposedly has Protein C deficiency.  I am not sure if this truly is the case.  He is on warfarin.  Again I am not sure why he has not been switched over to something a little bit more patient friendly.  Is hard to know exactly when the thyroid cancer started.  He has been followed at [facility].  He has had a left thyroidectomy.  Back in 2017 I think he had a biopsy of a left cervical lymph node.  He also had a biopsy of a mediastinal lymph node.  Both of these biopsies showed metastatic papillary thyroid cancer.  I do not think is ever had radioactive iodine therapy.  Again is not had a total thyroidectomy.  He last saw the endocrinologist at [facility] back in July 2019.  That his last note that I can see on her.  I think that she recommended surgery and RAI.  He subsequently had a recent CT scan.  He had pulmonary disease.  He had a right infrahilar mass measuring 6.2 x 5 1 cm.  There is a left lung base mass measuring 2.6 x 2.3 cm.  Had a right middle lobe lesion measuring 9 mm.  He had innumerable small nodules mainly in the lung bases.  He had numerous areas of hypodensity in the liver that appear to be larger more numerous.  It is possible these may represent metastasis.  He has not lost weight that is on wanted.  He may be little bit hoarse.  Has had a little bit of a cough.  He has had no hemoptysis.  There has been no bleeding otherwise.  His appetite is all right.  He has had no obvious change in bowel or bladder habits.  He has had no rashes.  He  has had some melanomas taken off his skin.  He has had basal cell carcinomas taken off the skin.  At this point, I really think that his overall performance status is probably ECOG 2.    Past Medical History:  Diagnosis Date  . Arthritis   . Cancer (HCC) 2003   melanoma  L ear  . Hypothyroidism    post op  . Pulmonary embolism (HCC) 2005 & 2007   protein C deficiency  :  Past Surgical History:  Procedure Laterality Date  . CERVICAL FUSION      X 2; Dr Stern  . COLONOSCOPY    . FINGER ARTHROPLASTY Right 04/17/2013   Procedure: IMPLANT ARTHROPLASTY RIGHT INDEX;  Surgeon: Robert V Sypher Jr., MD;  Location: [facility];  Service: Orthopedics;  Laterality: Right;  . LUMBAR LAMINECTOMY  2010   Dr Sternx3  . MELANOMA EXCISION  2003   L ear  . THYROIDECTOMY  1971   cytology indefinite  . TONSILLECTOMY    . TOTAL KNEE ARTHROPLASTY  2004   left  :   Current Outpatient Medications:  .  b complex vitamins tablet, Take 1 tablet by mouth daily., Disp: 100 tablet,   Rfl: 3 .  diphenhydramine-acetaminophen (TYLENOL PM) 25-500 MG TABS tablet, Take 1 tablet by mouth at bedtime., Disp: , Rfl:  .  famotidine (PEPCID) 40 MG tablet, Take 1 tablet (40 mg total) by mouth daily., Disp: 30 tablet, Rfl: 11 .  tamsulosin (FLOMAX) 0.4 MG CAPS capsule, TAKE 1 CAPSULE BY MOUTH DAILY AFTER SUPPER, Disp: 90 capsule, Rfl: 3 .  warfarin (COUMADIN) 5 MG tablet, TAKE 1 TABLET DAILY OR AS DIRECTED BY ANTICOAGULATION CLINIC, Disp: 100 tablet, Rfl: 1 .  levothyroxine (SYNTHROID, LEVOTHROID) 175 MCG tablet, Take 1 tablet by mouth daily., Disp: , Rfl: :  :  No Known Allergies:  Family History  Problem Relation Age of Onset  . Breast cancer Mother   . Alzheimer's disease Father   . Breast cancer Maternal Grandmother   . Breast cancer Sister   . Diabetes Neg Hx   . Stroke Neg Hx   . Heart disease Neg Hx   :  Social History   Socioeconomic History  . Marital status: Married    Spouse  name: Not on file  . Number of children: 4  . Years of education: 40  . Highest education level: Not on file  Occupational History  . Not on file  Tobacco Use  . Smoking status: Former Smoker    Packs/day: 0.75    Years: 41.00    Pack years: 30.75    Types: Cigarettes    Quit date: 02/13/1993    Years since quitting: 26.4  . Smokeless tobacco: Never Used  . Tobacco comment: smoked 1956-1995, up to 1/2 ppd  Substance and Sexual Activity  . Alcohol use: Yes    Comment:  Vodka 2 oz/ night  . Drug use: No  . Sexual activity: Not on file  Other Topics Concern  . Not on file  Social History Narrative   Fun: Plays golf   Denies abuse and feels safe at home   Social Determinants of Health   Financial Resource Strain:   . Difficulty of Paying Living Expenses:   Food Insecurity:   . Worried About Charity fundraiser in the Last Year:   . Arboriculturist in the Last Year:   Transportation Needs:   . Film/video editor (Medical):   Marland Kitchen Lack of Transportation (Non-Medical):   Physical Activity:   . Days of Exercise per Week:   . Minutes of Exercise per Session:   Stress:   . Feeling of Stress :   Social Connections:   . Frequency of Communication with Friends and Family:   . Frequency of Social Gatherings with Friends and Family:   . Attends Religious Services:   . Active Member of Clubs or Organizations:   . Attends Archivist Meetings:   Marland Kitchen Marital Status:   Intimate Partner Violence:   . Fear of Current or Ex-Partner:   . Emotionally Abused:   Marland Kitchen Physically Abused:   . Sexually Abused:   :  Review of Systems  Constitutional: Positive for malaise/fatigue.  HENT: Negative.   Eyes: Negative.   Respiratory: Positive for cough.   Cardiovascular: Positive for palpitations.  Gastrointestinal: Positive for heartburn.  Genitourinary: Negative.   Musculoskeletal: Positive for myalgias.  Skin: Negative.   Neurological: Positive for dizziness.   Endo/Heme/Allergies: Negative.   Psychiatric/Behavioral: Negative.    .  Exam:   This is a well-developed well-nourished elderly white male in no obvious distress.  Vital signs show temperature of 97.3.  Pulse 59.  Blood pressure 121/66.  Weight is 203 pounds.  Head and neck exam shows the left thyroidectomy scar.  He has no adenopathy in the neck.  There is no oral lesions.  He has no scleral icterus.  Pupils react appropriately.  Lungs are relatively clear bilaterally.  Cardiac exam regular rate and rhythm with no murmurs, rubs or bruits.  Abdomen is soft.  Is good bowel sounds.  There is no fluid wave.  There is no palpable liver or spleen tip.  Back exam shows no tenderness over the spine, ribs or hips.  Extremities shows no clubbing, cyanosis or edema.  He has decent range of motion of his joints.  He has decent strength in upper and lower extremities.  Skin exam does show some sun damage changes.  There is some actinic keratoses and some solar keratoses on his skin.   @IPVITALS@   Recent Labs    07/04/19 1459  WBC 5.5  HGB 12.9*  HCT 41.7  PLT 192   Recent Labs    07/04/19 1459  NA 140  K 4.6  CL 104  CO2 28  GLUCOSE 108*  BUN 17  CREATININE 0.98  CALCIUM 8.7*    Blood smear review: None  Pathology: None    Assessment and Plan: Clarence Hopkins is an 86-year-old white male.  Looks like he has metastatic thyroid cancer.  We really need to get a biopsy.  We really need to do molecular testing to see if he may have some genetic mutations for the thyroid cancer that we might be able to use targeted therapy.  I know that there are treatments now for thyroid cancers that have the RET mutation/fusion.  I will have to speak with interventional radiology to see how we might be able to get a biopsy.  Is possible we may have to consider bronchoscopy and transbronchial biopsies given that he has this large right infrahilar mass.  We really have to keep him on his quality of life and  his performance status.  We really do not want to be too aggressive and have a complications from the procedure.  He certainly may choose not to do any kind of treatments.  I certainly would understand this.  I am not sure why he has never had treatment before.  It sounds like it has been offered to him.  I do think he is wanting surgery for a thyroidectomy.  Because this, he cannot have the RIA.  I spent a little over an hour with he and his wife.  They are both very very nice.  He did serve our country in the military.  We really have to try to help him.  Again, we have to see about a biopsy and then genetic studies to see if there might be a actionable mutation we can target.  I tried to answer their questions.  We had good fellowship.  I did give him a prayer blanket which she was very thankful for.  

## 2019-07-07 ENCOUNTER — Telehealth: Payer: Self-pay | Admitting: Hematology & Oncology

## 2019-07-07 NOTE — Telephone Encounter (Signed)
No los 5/21

## 2019-07-08 ENCOUNTER — Other Ambulatory Visit: Payer: Self-pay

## 2019-07-08 ENCOUNTER — Ambulatory Visit (INDEPENDENT_AMBULATORY_CARE_PROVIDER_SITE_OTHER): Payer: Medicare Other | Admitting: General Practice

## 2019-07-08 DIAGNOSIS — Z7901 Long term (current) use of anticoagulants: Secondary | ICD-10-CM

## 2019-07-08 DIAGNOSIS — Z86718 Personal history of other venous thrombosis and embolism: Secondary | ICD-10-CM | POA: Diagnosis not present

## 2019-07-08 LAB — POCT INR: INR: 2.8 (ref 2.0–3.0)

## 2019-07-08 NOTE — Patient Instructions (Signed)
Pre visit review using our clinic review tool, if applicable. No additional management support is needed unless otherwise documented below in the visit note.  Continue to take 1 (5 mg) tablet daily.  Re-check in 6 weeks.

## 2019-07-09 ENCOUNTER — Encounter: Payer: Self-pay | Admitting: *Deleted

## 2019-07-09 ENCOUNTER — Telehealth: Payer: Self-pay | Admitting: Internal Medicine

## 2019-07-09 DIAGNOSIS — R918 Other nonspecific abnormal finding of lung field: Secondary | ICD-10-CM

## 2019-07-09 DIAGNOSIS — Z5181 Encounter for therapeutic drug level monitoring: Secondary | ICD-10-CM

## 2019-07-09 NOTE — Telephone Encounter (Signed)
Order placed,  Message routed to [facility]  Make sure schedule with Elsworth Soho for 6.2 since family has availability. Labs placed. Just needs to be told to get prior to procedure.  Patient already knows about stopping anticoag according to Dr. Tamala Julian.

## 2019-07-09 NOTE — Progress Notes (Signed)
Patient calling to see if there was a determination by IR as to whether or not they can perform biopsy. Spoke to Dr Marin Olp who states they called this afternoon and are declining. Next step will be a referral to Pulmonary to see if they can perform bronch with biopsy. Referral order placed.   Patient called and updated to status. He was appreciative of information. He is aware of pulmonary referral.

## 2019-07-09 NOTE — Telephone Encounter (Signed)
I have called patient's wife at [phone] regarding availability on 07/16/19.  They have time.  They will stop warfarin today and resume after procedure.  Procedure is to be scheduled with Kara Mead, please send Epic message to him and me when it is scheduled.  Dr. Elsworth Soho or a member of his team will see the patient for consult the morning of the procedure in pre-procedure room at [facility].  Please schedule the following:  Diagnosis: Lung Mass Procedure: Bronchoscopy with EBUS  Anesthesia: General with ETT Do you need Fluro? No Priority: 1 Date: 07/15/19 Alternate Date: 07/16/19  Time: Late AM Location: [facility] Does patient have OSA? No DM? no Or Latex allergy? no Medication Restriction: none Anticoagulate/Antiplatelet: He is on warfarin, he needs to stop this 5 days before surgery.  I told his wife to stop it today. Pre-op Labs Ordered: CBC, CMP, PT/INR, PTT Imaging request: None  (If, SuperDimension CT Chest, please have STAT courier sent to [place].)  Please coordinate Pre-op COVID Testing

## 2019-07-10 LAB — THYROGLOBULIN LEVEL: Thyroglobulin: 19 ng/mL

## 2019-07-10 NOTE — Telephone Encounter (Signed)
BRONCH/[email or web address]@[facility]  endo 07/15/19@1 :30   Covid test @[facility]  07/12/19@11 :30 pt will stop coumadin today and go to [facility] office to do lab work [employer]

## 2019-07-11 ENCOUNTER — Other Ambulatory Visit: Payer: Self-pay

## 2019-07-11 ENCOUNTER — Encounter (HOSPITAL_COMMUNITY): Payer: Self-pay | Admitting: Pulmonary Disease

## 2019-07-11 NOTE — Progress Notes (Signed)
Pt denies any acute pulmonary issues. Pt denies chest pain and being under the care of a cardiologist. Pt denies having a stress test but stated that a cardiac cath was performed > 20 years ago " everything was okay." Pt denies having a chest x ray. Pt stated that last dose of Coumadin was 07/10/19 as instructed by MD. Pt stated that he does not take Aspirin. Pt made aware to stop taking  vitamins, fish oil and herbal medications. Do not take any NSAIDs ie: Ibuprofen, Advil, Naproxen (Aleve), Motrin, BC and Goody Powder. Pt reminded to quarantine. Pt verbalized understanding of all pre-op instructions. See PA, Anesthesiology, note.

## 2019-07-11 NOTE — Anesthesia Preprocedure Evaluation (Addendum)
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  There is a left lung base mass measuring 2.6 x 2.3 cm.  Had a right middle lobe lesion measuring 9 mm.  He had innumerable small nodules mainly in the lung bases.  He had numerous areas of hypodensity in the liver that appear to be larger more numerous.  It is possible these may represent metastasis.   Pulmonary exam normal        Cardiovascular + DOE  Normal cardiovascular exam Rhythm:Regular Rate:Normal  TTE 06/19/19: 1. Left ventricular ejection fraction, by estimation, is 60 to 65%. The  left ventricle has normal function. The left ventricle has no regional  wall motion abnormalities. Left ventricular diastolic parameters are  indeterminate. The average left  ventricular global longitudinal strain is -23.6 %.  2. Right ventricular systolic function is normal. The right ventricular  size is normal. There is normal pulmonary artery systolic pressure.  3. The mitral valve is normal in structure. No evidence of mitral valve  regurgitation. No evidence of mitral stenosis.  4. The aortic valve is normal in structure. Aortic valve regurgitation is  trivial. No aortic stenosis is present.    Neuro/Psych negative neurological ROS  negative psych ROS   GI/Hepatic Neg liver ROS, GERD  Controlled,  Endo/Other  Hypothyroidism   Renal/GU negative Renal ROS  negative genitourinary   Musculoskeletal  (+) Arthritis , Osteoarthritis,    Abdominal Normal  abdominal exam  (+)   Peds  Hematology  (+) Blood dyscrasia, , Protein C deficiency- on coumadin   INR 1.3   Anesthesia Other Findings Hx thyroid ca and melanoma  Reproductive/Obstetrics negative OB ROS                          Anesthesia Physical Anesthesia Plan  ASA: III  Anesthesia Plan: General   Post-op Pain Management:    Induction: Intravenous  PONV Risk Score and Plan: 2 and Ondansetron, Dexamethasone and Treatment may vary due to age or medical condition  Airway Management Planned: Oral ETT  Additional Equipment: None  Intra-op Plan:   Post-operative Plan: Extubation in OR  Informed Consent: I have reviewed the patients History and Physical, chart, labs and discussed the procedure including the risks, benefits and alternatives for the proposed anesthesia with the patient or authorized representative who has indicated his/her understanding and acceptance.     Dental advisory given  Plan Discussed with: CRNA  Anesthesia Plan Comments:       Anesthesia Quick Evaluation

## 2019-07-11 NOTE — Progress Notes (Signed)
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 Per telephone encounter 07/10/19 pt instructed to stop coumadin on 5/27.  Metastatic thyroid cancer s/p left thyroidectomy. CT chest 06/25/19 showed Pulmonary mass in the RIGHT infrahilar region measuring 6.2 x 5.1 cm. Mass in the LEFT lung base 2.6 x 2.3 cm. Small nodule in the RIGHT middle lobe 9 mm. Innumerable tiny pulmonary nodules seen mainly in the lung bases in the 2-3 mm range. No pleural effusion. No consolidation. Airways are patent.  Recent echo was normal.  CMP and CBC from 07/04/19 reviewed, unremarkable.   EKG 06/03/19: Snius rhythm, Rate 61.  CT Chest 06/25/19: IMPRESSION: 1. Findings of pulmonary metastatic disease as described. 2. Numerous low-density hepatic lesions which appear to represent cysts but with a hypervascular focus in the RIGHT hepatic lobe and with some of these areas too small for definitive characterization. MRI of the liver may be helpful to exclude the possibility of hepatic involvement. 3. Aortic atherosclerosis. 4. Three-vessel coronary artery disease.  TTE 06/19/19: 1. Left ventricular ejection fraction, by estimation, is 60 to 65%. The  left ventricle has normal function. The left ventricle has no regional  wall motion abnormalities. Left ventricular diastolic parameters are  indeterminate. The average left  ventricular global longitudinal strain is -23.6 %.  2. Right ventricular systolic function is normal. The right ventricular  size is normal. There is normal pulmonary artery systolic pressure.  3. The mitral valve is normal in structure. No evidence of mitral valve  regurgitation. No evidence of mitral stenosis.  4. The aortic valve is normal in structure. Aortic valve regurgitation is  trivial. No aortic stenosis is present.   Clarence Hopkins [facility] Short Stay Center/Anesthesiology Phone [phone] 07/11/2019 9:42 AM

## 2019-07-12 ENCOUNTER — Other Ambulatory Visit (HOSPITAL_COMMUNITY)
Admission: RE | Admit: 2019-07-12 | Discharge: 2019-07-12 | Disposition: A | Discharge status: Home / Self Care | Payer: Medicare Other | Source: Ambulatory Visit | Attending: Pulmonary Disease | Admitting: Pulmonary Disease

## 2019-07-12 DIAGNOSIS — Z01812 Encounter for preprocedural laboratory examination: Secondary | ICD-10-CM | POA: Diagnosis not present

## 2019-07-12 DIAGNOSIS — Z20822 Contact with and (suspected) exposure to covid-19: Secondary | ICD-10-CM | POA: Insufficient documentation

## 2019-07-12 LAB — SARS CORONAVIRUS 2 (TAT 6-24 HRS): SARS Coronavirus 2: NEGATIVE

## 2019-07-15 ENCOUNTER — Encounter (HOSPITAL_COMMUNITY): Payer: Self-pay | Admitting: Pulmonary Disease

## 2019-07-15 ENCOUNTER — Ambulatory Visit (HOSPITAL_COMMUNITY)
Admission: RE | Admit: 2019-07-15 | Discharge: 2019-07-15 | Disposition: A | Discharge status: Home / Self Care | Payer: Medicare Other | Source: Ambulatory Visit | Attending: Pulmonary Disease | Admitting: Pulmonary Disease

## 2019-07-15 ENCOUNTER — Other Ambulatory Visit: Payer: Self-pay

## 2019-07-15 ENCOUNTER — Encounter (HOSPITAL_COMMUNITY)
Admission: RE | Disposition: A | Discharge status: Home / Self Care | Payer: Self-pay | Source: Ambulatory Visit | Attending: Pulmonary Disease

## 2019-07-15 ENCOUNTER — Ambulatory Visit (HOSPITAL_COMMUNITY): Payer: Medicare Other | Admitting: Physician Assistant

## 2019-07-15 ENCOUNTER — Ambulatory Visit (HOSPITAL_COMMUNITY): Payer: Medicare Other

## 2019-07-15 DIAGNOSIS — C73 Malignant neoplasm of thyroid gland: Secondary | ICD-10-CM | POA: Diagnosis not present

## 2019-07-15 DIAGNOSIS — C771 Secondary and unspecified malignant neoplasm of intrathoracic lymph nodes: Secondary | ICD-10-CM | POA: Insufficient documentation

## 2019-07-15 DIAGNOSIS — Z86711 Personal history of pulmonary embolism: Secondary | ICD-10-CM | POA: Diagnosis not present

## 2019-07-15 DIAGNOSIS — Z87891 Personal history of nicotine dependence: Secondary | ICD-10-CM | POA: Diagnosis not present

## 2019-07-15 DIAGNOSIS — Z981 Arthrodesis status: Secondary | ICD-10-CM | POA: Diagnosis not present

## 2019-07-15 DIAGNOSIS — R918 Other nonspecific abnormal finding of lung field: Secondary | ICD-10-CM | POA: Insufficient documentation

## 2019-07-15 DIAGNOSIS — C7802 Secondary malignant neoplasm of left lung: Secondary | ICD-10-CM | POA: Diagnosis not present

## 2019-07-15 DIAGNOSIS — Z85850 Personal history of malignant neoplasm of thyroid: Secondary | ICD-10-CM | POA: Diagnosis not present

## 2019-07-15 DIAGNOSIS — Z79899 Other long term (current) drug therapy: Secondary | ICD-10-CM | POA: Insufficient documentation

## 2019-07-15 DIAGNOSIS — D6859 Other primary thrombophilia: Secondary | ICD-10-CM | POA: Diagnosis not present

## 2019-07-15 DIAGNOSIS — E890 Postprocedural hypothyroidism: Secondary | ICD-10-CM | POA: Insufficient documentation

## 2019-07-15 DIAGNOSIS — Z7901 Long term (current) use of anticoagulants: Secondary | ICD-10-CM | POA: Insufficient documentation

## 2019-07-15 DIAGNOSIS — Z85820 Personal history of malignant melanoma of skin: Secondary | ICD-10-CM | POA: Insufficient documentation

## 2019-07-15 DIAGNOSIS — C770 Secondary and unspecified malignant neoplasm of lymph nodes of head, face and neck: Secondary | ICD-10-CM | POA: Insufficient documentation

## 2019-07-15 DIAGNOSIS — Z96691 Finger-joint replacement of right hand: Secondary | ICD-10-CM | POA: Insufficient documentation

## 2019-07-15 DIAGNOSIS — C7801 Secondary malignant neoplasm of right lung: Secondary | ICD-10-CM | POA: Diagnosis not present

## 2019-07-15 DIAGNOSIS — Z96652 Presence of left artificial knee joint: Secondary | ICD-10-CM | POA: Insufficient documentation

## 2019-07-15 DIAGNOSIS — C3491 Malignant neoplasm of unspecified part of right bronchus or lung: Secondary | ICD-10-CM | POA: Diagnosis not present

## 2019-07-15 HISTORY — DX: Gastro-esophageal reflux disease without esophagitis: K21.9

## 2019-07-15 HISTORY — DX: Other nonspecific abnormal finding of lung field: R91.8

## 2019-07-15 HISTORY — PX: BRONCHIAL WASHINGS: SHX5105

## 2019-07-15 HISTORY — PX: ENDOBRONCHIAL ULTRASOUND: SHX5096

## 2019-07-15 HISTORY — PX: FINE NEEDLE ASPIRATION: SHX5430

## 2019-07-15 HISTORY — PX: VIDEO BRONCHOSCOPY: SHX5072

## 2019-07-15 HISTORY — PX: BRONCHIAL BRUSHINGS: SHX5108

## 2019-07-15 LAB — PROTIME-INR
INR: 1.3 — ABNORMAL HIGH (ref 0.8–1.2)
Prothrombin Time: 15.8 seconds — ABNORMAL HIGH (ref 11.4–15.2)

## 2019-07-15 SURGERY — VIDEO BRONCHOSCOPY WITHOUT FLUORO
Anesthesia: General

## 2019-07-15 MED ORDER — EPHEDRINE SULFATE-NACL 50-0.9 MG/10ML-% IV SOSY
PREFILLED_SYRINGE | INTRAVENOUS | Status: DC | PRN
  Administered 2019-07-15 (×5): 5 mg via INTRAVENOUS

## 2019-07-15 MED ORDER — SUGAMMADEX SODIUM 200 MG/2ML IV SOLN
INTRAVENOUS | Status: DC | PRN
  Administered 2019-07-15: 200 mg via INTRAVENOUS

## 2019-07-15 MED ORDER — LIDOCAINE 2% (20 MG/ML) 5 ML SYRINGE
INTRAMUSCULAR | Status: DC | PRN
  Administered 2019-07-15: 60 mg via INTRAVENOUS

## 2019-07-15 MED ORDER — PHENYLEPHRINE 40 MCG/ML (10ML) SYRINGE FOR IV PUSH (FOR BLOOD PRESSURE SUPPORT)
PREFILLED_SYRINGE | INTRAVENOUS | Status: DC | PRN
  Administered 2019-07-15 (×2): 80 ug via INTRAVENOUS
  Administered 2019-07-15: 40 ug via INTRAVENOUS
  Administered 2019-07-15 (×2): 80 ug via INTRAVENOUS
  Administered 2019-07-15: 40 ug via INTRAVENOUS

## 2019-07-15 MED ORDER — CHLORHEXIDINE GLUCONATE 0.12 % MT SOLN
OROMUCOSAL | Status: AC
  Filled 2019-07-15: qty 15

## 2019-07-15 MED ORDER — PROPOFOL 10 MG/ML IV BOLUS
INTRAVENOUS | Status: DC | PRN
  Administered 2019-07-15: 20 mg via INTRAVENOUS
  Administered 2019-07-15: 120 mg via INTRAVENOUS
  Administered 2019-07-15: 30 mg via INTRAVENOUS

## 2019-07-15 MED ORDER — DEXAMETHASONE SODIUM PHOSPHATE 10 MG/ML IJ SOLN
INTRAMUSCULAR | Status: DC | PRN
  Administered 2019-07-15: 5 mg via INTRAVENOUS

## 2019-07-15 MED ORDER — FENTANYL CITRATE (PF) 250 MCG/5ML IJ SOLN
INTRAMUSCULAR | Status: DC | PRN
  Administered 2019-07-15: 50 ug via INTRAVENOUS

## 2019-07-15 MED ORDER — ONDANSETRON HCL 4 MG/2ML IJ SOLN
INTRAMUSCULAR | Status: DC | PRN
  Administered 2019-07-15: 4 mg via INTRAVENOUS

## 2019-07-15 MED ORDER — CHLORHEXIDINE GLUCONATE 0.12 % MT SOLN
15.0000 mL | Freq: Once | OROMUCOSAL | Status: AC
  Administered 2019-07-15: 15 mL via OROMUCOSAL

## 2019-07-15 MED ORDER — LACTATED RINGERS IV SOLN: INTRAVENOUS | Status: DC

## 2019-07-15 MED ORDER — LACTATED RINGERS IV SOLN: INTRAVENOUS | Status: DC | PRN

## 2019-07-15 MED ORDER — ROCURONIUM BROMIDE 10 MG/ML (PF) SYRINGE
PREFILLED_SYRINGE | INTRAVENOUS | Status: DC | PRN
  Administered 2019-07-15: 50 mg via INTRAVENOUS

## 2019-07-15 NOTE — Anesthesia Postprocedure Evaluation (Signed)
Anesthesia Post Note  Patient: ARHAN MCMANAMON  Procedure(s) Performed: VIDEO BRONCHOSCOPY WITHOUT FLUORO (N/A ) ENDOBRONCHIAL ULTRASOUND (N/A ) BRONCHIAL WASHINGS FINE NEEDLE ASPIRATION (FNA) LINEAR BRONCHIAL BRUSHINGS     Patient location during evaluation: PACU Anesthesia Type: General Level of consciousness: awake and alert and oriented Pain management: pain level controlled Vital Signs Assessment: post-procedure vital signs reviewed and stable Respiratory status: spontaneous breathing, nonlabored ventilation and respiratory function stable Cardiovascular status: blood pressure returned to baseline Postop Assessment: no apparent nausea or vomiting Anesthetic complications: no    Last Vitals:  Vitals:   07/15/19 1515 07/15/19 1530  BP: (!) 157/84 (!) 168/68  Pulse: 85 79  Resp: 17 17  Temp:    SpO2: 95% 95%    Last Pain:  Vitals:   07/15/19 1530  TempSrc:   PainSc: 0-No pain                 Brennan Bailey

## 2019-07-15 NOTE — Anesthesia Procedure Notes (Signed)
Procedure Name: Intubation Date/Time: 07/15/2019 1:46 PM Performed by: Gaylene Brooks, CRNA Pre-anesthesia Checklist: Patient identified, Emergency Drugs available, Suction available and Patient being monitored Patient Re-evaluated:Patient Re-evaluated prior to induction Oxygen Delivery Method: Circle System Utilized Preoxygenation: Pre-oxygenation with 100% oxygen Induction Type: IV induction Ventilation: Mask ventilation without difficulty Laryngoscope Size: Miller and 2 Grade View: Grade II Tube type: Oral Tube size: 8.5 mm Number of attempts: 1 Airway Equipment and Method: Stylet,  Oral airway and Bougie stylet Placement Confirmation: ETT inserted through vocal cords under direct vision,  positive ETCO2 and breath sounds checked- equal and bilateral Secured at: 22 cm Tube secured with: Tape Dental Injury: Teeth and Oropharynx as per pre-operative assessment  Comments: Grade 2/3 view. ETT advanced over bougie stylet. AOI. +ETCO2, BBS=

## 2019-07-15 NOTE — Op Note (Signed)
  Name:  [name] MRN:  [id number] DOB:  [birth year]  PROCEDURE NOTE  Procedure(s): Flexible bronchoscopy [phone]) Brushing [phone]) of the RLL Bronchial alveolar lavage ([id number]) of the RLL Endobronchial ultrasound ([id number]) Transbronchial needle aspiration (75916) of RT infrahilar mass   Indications:  Multiple lung masses ,thyroid cancer  Consent:  Written informed consent was obtained prior to the procedure. The risks of the procedure including coughing, bleeding and the small chance of lung puncture requiring chest tube were discussed in great detail. The benefits & alternatives including serial follow up were also discussed.  Anesthesia:  General endotracheal.  Procedure summary:  Appropriate equipment was assembled.  The patient was  identified as Clarence Hopkins. Interim history obtained and brought to the endoscopy room. Safety timeout was performed. The patient was placed supine on the operating table, airway established and general anesthesia administered by Anesthesia team.   After the appropriate level of anesthesia was assured, flexible video bronchoscope was lubricated and inserted through the endotracheal tube.    Airway examination was performed bilaterally to subsegmental level.  Minimal clear secretions were noted, mucosa appeared normal and no endobronchial lesions were identified.  Endobronchial ultrasound video bronchoscope was then lubricated and inserted through the endotracheal tube. Surveillance of the mediastinal and and bilateral hilar lymph node stations was performed.  Pathologically enlarged lung mass was noted at station 10.  Endobronchial ultrasound guided transbronchial needle aspiration of RT infra hilar mass (passes 5) was performed, after which EBUS bronchoscope was withdrawn.  Flexible video bronchoscope was used again to perform brushings & BAL from RLL.  After ensuring hemostasis , the bronchoscope was withdrawn.  The patient was extubated & transferred  to recovery  Specimens sent: Bronchial alveolar lavage specimen of the RLL cytology. Brushings RLL -cytology [facility] - station 10 - for cytology  Complications:  No immediate complications were noted.  Hemodynamic parameters and oxygenation remained stable throughout the procedure.  Estimated blood loss:  Less then 5 mL.   Kara Mead MD. Shade Flood. [facility] Pager [phone] If no response call [phone]   07/15/2019 2:47 PM

## 2019-07-15 NOTE — Transfer of Care (Addendum)
Immediate Anesthesia Transfer of Care Note  Patient: Clarence Hopkins  Procedure(s) Performed: VIDEO BRONCHOSCOPY WITHOUT FLUORO (N/A ) ENDOBRONCHIAL ULTRASOUND (N/A ) BRONCHIAL WASHINGS FINE NEEDLE ASPIRATION (FNA) LINEAR BRONCHIAL BRUSHINGS  Patient Location: PACU and Endoscopy Unit  Anesthesia Type:General  Level of Consciousness: awake and alert   Airway & Oxygen Therapy: Patient Spontanous Breathing and Patient connected to nasal cannula oxygen  Post-op Assessment: Report given to RN and Post -op Vital signs reviewed and stable  Post vital signs: Reviewed and stable  Last Vitals:  Vitals Value Taken Time  BP 165/99 07/15/19 1459  Temp 36.4 C 07/15/19 1459  Pulse 91 07/15/19 1504  Resp 19 07/15/19 1504  SpO2 97 % 07/15/19 1504  Vitals shown include unvalidated device data.  Last Pain:  Vitals:   07/15/19 1459  TempSrc: Axillary  PainSc: 0-No pain      Patients Stated Pain Goal: 5 (42/10/31 2811)  Complications: No apparent anesthesia complications

## 2019-07-15 NOTE — Discharge Instructions (Signed)
Flexible Bronchoscopy  Flexible bronchoscopy is a procedure that is used to examine the passageways in the lungs. During the procedure, a thin, flexible tool with a camera on it (bronchoscope) is passed into the mouth or nose, down through the windpipe (trachea), and into the air tubes (bronchi) in the lungs. This tool allows your health care provider to look at your lungs from the inside and take testing (diagnostic) samples if needed. Tell a health care provider about:  Any allergies you have.  All medicines you are taking, including vitamins, herbs, eye drops, creams, and over-the-counter medicines.  Any problems you or family members have had with anesthetic medicines.  Any blood disorders you have.  Any surgeries you have had.  Any medical conditions you have.  Whether you are pregnant or may be pregnant. What are the risks? Generally, this is a safe procedure. However, problems may occur, including:  Infection.  Bleeding.  Damage to other structures or organs.  Allergic reactions to medicines.  Collapsed lung (pneumothorax).  Increased need for oxygen or difficulty breathing after the procedure. What happens before the procedure? Medicines Ask your health care provider about:  Changing or stopping your regular medicines. This is especially important if you are taking diabetes medicines or blood thinners.  Taking medicines such as aspirin and ibuprofen. These medicines can thin your blood. Do not take these medicines before your procedure if your health care provider instructs you not to. You may be given antibiotic medicine to help prevent infection. Staying hydrated Follow instructions from your health care provider about hydration, which may include:  Up to 2 hours before the procedure - you may continue to drink clear liquids, such as water, clear fruit juice, black coffee, and plain tea. Eating and drinking Follow instructions from your health care provider  about eating and drinking, which may include:  8 hours before the procedure - stop eating heavy meals or foods such as meat, fried foods, or fatty foods.  6 hours before the procedure - stop eating light meals or foods, such as toast or cereal.  6 hours before the procedure - stop drinking milk or drinks that contain milk.  2 hours before the procedure - stop drinking clear liquids. General instructions  Plan to have someone take you home from the hospital or clinic.  If you will be going home right after the procedure, plan to have someone with you for 24 hours. What happens during the procedure?  To lower your risk of infection: ? Your health care team will wash or sanitize their hands. ? Your skin will be washed with soap.  An IV tube will be inserted into one of your veins.  You will be given a medicine (local anesthetic) to numb your mouth, nose, throat, and voice box (larynx). You may also be given one or more of the following: ? A medicine to help you relax (sedative). ? A medicine to control coughing. ? A medicine to dry up any fluids in your lungs (secretions).  A bronchoscope will be passed into your nose or mouth, and into your lungs. Your health care provider will examine your lungs.  Samples of airway secretions may be collected for testing.  If abnormal areas are seen in your airways, tissue samples may be removed for examination under a microscope (biopsy).  If tissue samples are needed from the outer parts of the lung, a type of X-ray (fluoroscopy) may be used to guide the bronchoscope to these areas.  If bleeding  occurs, you may be given medicine to stop or decrease the bleeding. The procedure may vary among health care providers and hospitals. What happens after the procedure?  Do not drive for 24 hours if you were given a sedative.  Your blood pressure, heart rate, breathing rate, and blood oxygen level will be monitored until the medicines you were given  have worn off.  You may have a chest X-ray to check for signs of pneumothorax.  You will not be allowed to eat or drink anything for 2 hours after your procedure.  If a biopsy was taken, it is up to you to get the results of your procedure. Ask your health care provider, or the department that is doing the procedure, when your results will be ready. Summary  Flexible bronchoscopy is a procedure that allows your health care provider to look closely at your lungs from the inside and take testing (diagnostic) samples if needed.  Risks of flexible bronchoscopy include bleeding, infection, and pneumothorax.  Before a flexible bronchoscopy, you will be given a medicine (local anesthetic) to numb your mouth, nose, throat, and voice box (larynx). Then, a bronchoscope will be passed into your nose or mouth, and into your lungs.  After the procedure, your blood pressure, heart rate, breathing rate, and blood oxygen level will be monitored until the medicines you were given have worn off. You may have a chest X-ray to check for signs of pneumothorax.  You will not be allowed to eat or drink anything for 2 hours after your procedure. This information is not intended to replace advice given to you by your health care provider. Make sure you discuss any questions you have with your health care provider. Document Revised: 01/12/2017 Document Reviewed: 03/04/2016 Elsevier Patient Education  2020 [employer].

## 2019-07-15 NOTE — H&P (Signed)
NAME:  Clarence Hopkins, MRN:  [id number], DOB:  [birth year], LOS: 0 ADMISSION DATE:  07/15/2019, CONSULTATION DATE:  07/15/2019 REFERRING MD:  Dr. Tamala Julian, CHIEF COMPLAINT:  Lung mass  Brief History   86 year old male with history of metastatic thyroid cancer with recent surveillance CT showing numerous enlarging lung masses presenting for bronchoscopy and EBUS.   History of present illness    Clarence Hopkins is a 86 year old male with prior history of former smoker (quit 1995), recurrent pulmonary embolisms with protein C deficiency on coumadin, hypothyroidism, thyroid cancer and dysphagia.   He has been followed previously for his thyroid cancer at [facility].  Had presumably thyroid cancer incompletely resected in 1971, details unknown.  Evidence of recurrence in 2017 with metastatic disease to the lungs but remained largely asymptomatic.  He was then noted to have innumerable small sub cm lung nodules relatively stable over the years 2003- 2009 until a large right mass and larger left base mass noted.  Underwent biopsy of the left cervical and mediastinal lymph node which both showed metastatic papillary thyroid cancer.  He apparently at that time had declined surgery and RAI.  In May 2021, he underwent a surveillance CT which showed a pulmonary mass in the right infrahilar region measuring 6.2 x 5.1 cm, small nodule in the right middle lobe 9 mm, innumerable tiny pulmonary nodules seen mainly in the lung bases in the 2-3 mm range,  mass in the left lung base 2.6 x 2.3 cm, as well as numerous low-density hepatic lesions which appear to represent cysts but with a hypervascular focus in the right hepatic lobe and with some of these areas too small for definitive characterization.  Was then referred to  Dr. Marin Olp, seen on 5/2,  with recommendations for lung biopsy to help clarify possible treatment options such as targeted therapy.  He was referred to pulmonary for further evaluation and possible bronchoscopy with EBUS  which is scheduled for today.  Last dose of warfarin 5/28.     Denies any recent weight loss or hemoptysis.  Reports some hoarseness and slight cough.  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  He is married and lives in [place].  Former smoker, quit in 1995 and was 0.75 PPD.    Past Medical History   Past Medical History:  Diagnosis Date  . Arthritis   . Cancer ([place]) 2003   melanoma  L ear  . GERD (gastroesophageal reflux disease)   . Hypothyroidism    post op  . Lung mass   . Pulmonary embolism ([place]) 2005 & 2007   protein C deficiency   Significant Hospital Events    Consults:   Procedures:   Significant Diagnostic Tests:   06/25/2019  CLINICAL DATA:  Thyroid cancer surveillance  EXAM: CT CHEST WITH CONTRAST  TECHNIQUE: Multidetector CT imaging of the chest was performed during intravenous contrast administration.  CONTRAST:  124mL ISOVUE-300 IOPAMIDOL (ISOVUE-300) INJECTION 61%  COMPARISON:  04/26/2005  FINDINGS: Cardiovascular: Calcified and noncalcified atheromatous plaque in the thoracic aorta. No aneurysm. Heart size is normal with three-vessel coronary artery disease. No signs of pericardial effusion. Central pulmonary vasculature is unremarkable.  Mediastinum/Nodes: No adenopathy in the mediastinum. RIGHT infrahilar mass in the chest, see below. No thoracic inlet adenopathy. No adenopathy in the mediastinum. Esophagus grossly normal.  Lungs/Pleura: Pulmonary mass in the RIGHT infrahilar region measuring 6.2 x 5.1 cm (image 26 select  04/10/2024 of series 10)  Mass in the LEFT lung base (image 225, series 10) 2.6 x 2.3 cm  Small nodule in the RIGHT middle lobe (image 225, series 10) 9 mm.  Innumerable tiny pulmonary nodules seen mainly in the lung bases in the 2-3 mm range. No pleural effusion. No consolidation. Airways are patent.  Upper Abdomen: Numerous areas of  hypodensity in the liver appear larger and more numerous than on previous imaging. There is a hypervascular focus partially imaged in the inferior RIGHT hepatic lobe 2.7 cm (image 372 of series 10). No upper abdominal process with acute features.  Cysts in the bilateral kidneys. Kidneys are partially imaged. The large cyst arising from the upper pole is larger than in 2007 now measuring 10 cm in greatest axial dimension as compared to 8.5 cm.  Musculoskeletal: No acute bone finding or destructive bone process. Spinal degenerative changes.  IMPRESSION: 1. Findings of pulmonary metastatic disease as described. 2. Numerous low-density hepatic lesions which appear to represent cysts but with a hypervascular focus in the RIGHT hepatic lobe and with some of these areas too small for definitive characterization. MRI of the liver may be helpful to exclude the possibility of hepatic involvement. 3. Aortic atherosclerosis. 4. Three-vessel coronary artery disease. Aortic Atherosclerosis  Micro Data:  07/12/2019 SARS 2 >> negative  Antimicrobials:  n/a  Interim history/subjective:    Objective   There were no vitals taken for this visit.       No intake or output data in the 24 hours ending 07/15/19 1126 There were no vitals filed for this visit.  Examination: Gen. Pleasant, well-nourished, tall, elderly man in no distress, normal affect ENT - no pallor,icterus, no post nasal drip, thyroidectomy scar and vertical scar left side of neck Neck: No JVD, no thyromegaly, no carotid bruits Lungs: no use of accessory muscles, no dullness to percussion, clear without rales or rhonchi  Cardiovascular: Rhythm regular, heart sounds  normal, no murmurs or gallops, no peripheral edema Abdomen: soft and non-tender, no hepatosplenomegaly, BS normal. Musculoskeletal: No deformities, no cyanosis or clubbing Neuro:  alert, non focal   Resolved Hospital Problem list    Assessment & Plan:    Numerous lung masses with history of metastatic papillary thyroid cancer -  Proceed with bronchoscopy and EBUS guided TB NA for biopsies. If endobronchial component noted then endobronchial biopsies will be performed Alternative site of biopsy would be liver lesions  The various options of biopsy including bronchoscopy, CT guided needle aspiration and surgical biopsy were discussed.The risks of each procedure including coughing, bleeding and the  chances of lung puncture requiring chest tube were discussed in great detail. The benefits & alternatives including serial follow up were also discussed.  Warfarin has been held for 3 days and repeat INR is 1.3 and he is agreeable to proceed  CBC    Component Value Date/Time   WBC 5.5 07/04/2019 1459   WBC 5.6 06/03/2019 1540   RBC 4.67 07/04/2019 1459   HGB 12.9 (L) 07/04/2019 1459   HCT 41.7 07/04/2019 1459   PLT 192 07/04/2019 1459   MCV 89.3 07/04/2019 1459   MCH 27.6 07/04/2019 1459   MCHC 30.9 07/04/2019 1459   RDW 12.8 07/04/2019 1459   LYMPHSABS 1.5 07/04/2019 1459   MONOABS 0.5 07/04/2019 1459   EOSABS 0.2 07/04/2019 1459   BASOSABS 0.0 07/04/2019 1459    BMET    Component Value Date/Time   NA 140 07/04/2019 1459   K 4.6 07/04/2019  1459   CL 104 07/04/2019 1459   CO2 28 07/04/2019 1459   GLUCOSE 108 (H) 07/04/2019 1459   BUN 17 07/04/2019 1459   CREATININE 0.98 07/04/2019 1459   CALCIUM 8.7 (L) 07/04/2019 1459   GFRNONAA >60 07/04/2019 1459   GFRAA >60 07/04/2019 1459     Past Medical History  He,  has a past medical history of Arthritis, Cancer ([place]) (2003), GERD (gastroesophageal reflux disease), Hypothyroidism, Lung mass, and Pulmonary embolism ([place]) (2005 & 2007).   Surgical History    Past Surgical History:  Procedure Laterality Date  . CARDIAC CATHETERIZATION     > 20 years ago " everything was okay"   . CERVICAL FUSION      X 2; Dr Vertell Limber  . COLONOSCOPY    . FINGER ARTHROPLASTY Right 04/17/2013    Procedure: IMPLANT ARTHROPLASTY RIGHT INDEX;  Surgeon: Cammie Sickle., MD;  Location: [facility];  Service: Orthopedics;  Laterality: Right;  . LUMBAR LAMINECTOMY  2010   Dr Becky Sax  . MELANOMA EXCISION  2003   L ear  . THYROIDECTOMY  1971   cytology indefinite  . TONSILLECTOMY    . TOTAL KNEE ARTHROPLASTY  2004   left  . WISDOM TOOTH EXTRACTION       Social History   reports that he quit smoking about 26 years ago. His smoking use included cigarettes. He has a 30.75 pack-year smoking history. He has never used smokeless tobacco. He reports current alcohol use. He reports that he does not use drugs.   Family History   His family history includes Alzheimer's disease in his father; Breast cancer in his maternal grandmother, mother, and sister. There is no history of Diabetes, Stroke, or Heart disease.   Allergies No Known Allergies   Home Medications  Prior to Admission medications   Medication Sig Start Date End Date Taking? Authorizing Provider  b complex vitamins tablet Take 1 tablet by mouth daily. 03/05/17  Yes Plotnikov, Evie Lacks, MD  diphenhydramine-acetaminophen (TYLENOL PM) 25-500 MG TABS tablet Take 1 tablet by mouth at bedtime.   Yes [provider]  Glycerin-Hypromellose-PEG 400 (DRY EYE RELIEF DROPS) 0.2-0.2-1 % SOLN Place 1 drop into both eyes daily as needed (Dry eye).   Yes [provider]  levothyroxine (SYNTHROID, LEVOTHROID) 175 MCG tablet Take 175 mcg by mouth daily.  01/26/16 07/10/19 Yes [provider]  tamsulosin (FLOMAX) 0.4 MG CAPS capsule TAKE 1 CAPSULE BY MOUTH DAILY AFTER SUPPER Patient taking differently: Take 0.4 mg by mouth at bedtime.  05/19/19  Yes Plotnikov, Evie Lacks, MD  warfarin (COUMADIN) 5 MG tablet TAKE 1 TABLET DAILY OR AS DIRECTED BY ANTICOAGULATION CLINIC Patient taking differently: Take 5 mg by mouth daily. Take 1 tablet daily or as directed by anticoagulation clinic 04/08/19  Yes Plotnikov, Evie Lacks, MD  warfarin (COUMADIN) 5 MG tablet Take 1 tablet daily or as directed by anticoagulation clinic 09/19/18   Plotnikov, Evie Lacks, MD     Kara Mead MD. FCCP. [facility]  If no response to pager , please call [phone]   07/15/2019

## 2019-07-17 LAB — CYTOLOGY - NON PAP

## 2019-07-18 ENCOUNTER — Encounter: Payer: Self-pay | Admitting: *Deleted

## 2019-07-18 NOTE — Progress Notes (Signed)
Patient's bronch yielded minimal tissue for testing. Dr Marin Olp would like to bring him in next week to speak to him, but also get a liquid biopsy at that time.  Patient scheduled for 07/22/2019. Desk RN notified for need of [facility] Liquid Biopsy at that appointment.

## 2019-07-21 ENCOUNTER — Other Ambulatory Visit: Payer: Self-pay | Admitting: *Deleted

## 2019-07-21 DIAGNOSIS — C73 Malignant neoplasm of thyroid gland: Secondary | ICD-10-CM

## 2019-07-22 ENCOUNTER — Inpatient Hospital Stay: Payer: Medicare Other | Attending: Hematology & Oncology | Admitting: Hematology & Oncology

## 2019-07-22 ENCOUNTER — Encounter: Payer: Self-pay | Admitting: Hematology & Oncology

## 2019-07-22 ENCOUNTER — Other Ambulatory Visit: Payer: Self-pay

## 2019-07-22 ENCOUNTER — Inpatient Hospital Stay: Payer: Medicare Other

## 2019-07-22 VITALS — BP 152/79 | HR 59 | Temp 97.9°F | Resp 18 | Ht 75.0 in | Wt 201.0 lb

## 2019-07-22 DIAGNOSIS — C73 Malignant neoplasm of thyroid gland: Secondary | ICD-10-CM | POA: Diagnosis not present

## 2019-07-22 LAB — CBC WITH DIFFERENTIAL (CANCER CENTER ONLY)
Abs Immature Granulocytes: 0.02 10*3/uL (ref 0.00–0.07)
Basophils Absolute: 0 10*3/uL (ref 0.0–0.1)
Basophils Relative: 0 %
Eosinophils Absolute: 0.2 10*3/uL (ref 0.0–0.5)
Eosinophils Relative: 3 %
HCT: 42.6 % (ref 39.0–52.0)
Hemoglobin: 13.4 g/dL (ref 13.0–17.0)
Immature Granulocytes: 0 %
Lymphocytes Relative: 29 %
Lymphs Abs: 1.7 10*3/uL (ref 0.7–4.0)
MCH: 28.2 pg (ref 26.0–34.0)
MCHC: 31.5 g/dL (ref 30.0–36.0)
MCV: 89.5 fL (ref 80.0–100.0)
Monocytes Absolute: 0.6 10*3/uL (ref 0.1–1.0)
Monocytes Relative: 11 %
Neutro Abs: 3.3 10*3/uL (ref 1.7–7.7)
Neutrophils Relative %: 57 %
Platelet Count: 198 10*3/uL (ref 150–400)
RBC: 4.76 MIL/uL (ref 4.22–5.81)
RDW: 12.7 % (ref 11.5–15.5)
WBC Count: 5.8 10*3/uL (ref 4.0–10.5)
nRBC: 0 % (ref 0.0–0.2)

## 2019-07-22 LAB — CMP (CANCER CENTER ONLY)
ALT: 14 U/L (ref 0–44)
AST: 17 U/L (ref 15–41)
Albumin: 3.9 g/dL (ref 3.5–5.0)
Alkaline Phosphatase: 66 U/L (ref 38–126)
Anion gap: 5 (ref 5–15)
BUN: 17 mg/dL (ref 8–23)
CO2: 30 mmol/L (ref 22–32)
Calcium: 9.4 mg/dL (ref 8.9–10.3)
Chloride: 103 mmol/L (ref 98–111)
Creatinine: 1.15 mg/dL (ref 0.61–1.24)
GFR, Est AFR Am: >60 mL/min (ref >60)
GFR, Estimated: 57 mL/min — ABNORMAL LOW (ref >60)
Glucose, Bld: 90 mg/dL (ref 70–99)
Potassium: 4.6 mmol/L (ref 3.5–5.1)
Sodium: 138 mmol/L (ref 135–145)
Total Bilirubin: 0.6 mg/dL (ref 0.3–1.2)
Total Protein: 6.8 g/dL (ref 6.5–8.1)

## 2019-07-23 ENCOUNTER — Telehealth: Payer: Self-pay | Admitting: Hematology & Oncology

## 2019-07-23 NOTE — Progress Notes (Signed)
Hematology and Oncology Follow Up Visit  DMITRI PETTIGREW [id number] [birth year] 86 y.o. 07/23/2019   Principle Diagnosis:   Metastatic papillary carcinoma of the thyroid  Current Therapy:    Observation     Interim History:  Mr. Yusuf is back for a second office visit.  We first saw Mr. Majka back on May 21.  At that time, looks like he had metastatic disease.  He does have a somewhat complicated history.  He had been followed at [facility] endocrinology.  They actually had done a biopsy of a lung lesion a couple years ago that was positive for thyroid cancer.  I think he was recommended to have radioactive iodine therapy.  He declined this at the time.  When I saw him, he had a recent CT scan of the chest.  He had a right infrahilar mass measuring 6.2 x 5.1 cm.  He had a left lung base mass measuring 2.6 x 2.3 cm.  Had a 9 mm right middle lobe mass.  He had no other small lung nodules.  There was some lesions in the liver which were not able to be characterized.  I thought that we might try to get a biopsy from this right infrahilar mass.  I thought this would be helpful to try to get some tumor and see if we cannot get some molecular studies.    He underwent a bronchoscopy and transbronchial biopsy on 07/15/2019.  The pathology report ([id number]) show malignant cells but unfortunately there was just not enough to characterize.  As such there was no specimen for molecular markers.  He looks pretty good.  He feels okay.  He is not having any cough.  He is having no shortness of breath.  Is having some dizziness but this is more chronic.  I really think that radioactive iodine would probably be a reasonable way to try to help him.  He has never had this.  I think this would be a form of "targeted therapy."  I think we can try to just go after the tumor is, this would help him.  I think if we can use the radioactive iodine and try to control the cancer, this would help him  significantly.  Currently, I would have to say his performance status is probably ECOG 2.  Medications:  Current Outpatient Medications:  .  b complex vitamins tablet, Take 1 tablet by mouth daily., Disp: 100 tablet, Rfl: 3 .  diphenhydramine-acetaminophen (TYLENOL PM) 25-500 MG TABS tablet, Take 1 tablet by mouth at bedtime., Disp: , Rfl:  .  Glycerin-Hypromellose-PEG 400 (DRY EYE RELIEF DROPS) 0.2-0.2-1 % SOLN, Place 1 drop into both eyes daily as needed (Dry eye)., Disp: , Rfl:  .  tamsulosin (FLOMAX) 0.4 MG CAPS capsule, TAKE 1 CAPSULE BY MOUTH DAILY AFTER SUPPER (Patient taking differently: Take 0.4 mg by mouth at bedtime. ), Disp: 90 capsule, Rfl: 3 .  warfarin (COUMADIN) 5 MG tablet, TAKE 1 TABLET DAILY OR AS DIRECTED BY ANTICOAGULATION CLINIC (Patient taking differently: Take 5 mg by mouth daily. Take 1 tablet daily or as directed by anticoagulation clinic), Disp: 100 tablet, Rfl: 1 .  levothyroxine (SYNTHROID, LEVOTHROID) 175 MCG tablet, Take 175 mcg by mouth daily. , Disp: , Rfl:   Allergies: No Known Allergies  Past Medical History, Surgical history, Social history, and Family History were reviewed and updated.  Review of Systems: Review of Systems  Constitutional: Negative.   HENT:  Negative.   Eyes: Negative.   Respiratory:  Negative.   Cardiovascular: Negative.   Gastrointestinal: Negative.   Endocrine: Negative.   Genitourinary: Negative.    Musculoskeletal: Negative.   Skin: Negative.   Neurological: Negative.   Hematological: Negative.   Psychiatric/Behavioral: Negative.     Physical Exam:  height is 6\' 3"  (1.905 m) and weight is 201 lb (91.2 kg). His temporal temperature is 97.9 F (36.6 C). His blood pressure is 152/79 (abnormal) and his pulse is 59 (abnormal). His respiration is 18 and oxygen saturation is 98%.   Wt Readings from Last 3 Encounters:  07/22/19 201 lb (91.2 kg)  07/15/19 198 lb (89.8 kg)  07/04/19 203 lb (92.1 kg)    Physical Exam Vitals  reviewed.  HENT:     Head: Normocephalic and atraumatic.  Eyes:     Pupils: Pupils are equal, round, and reactive to light.  Cardiovascular:     Rate and Rhythm: Normal rate and regular rhythm.     Heart sounds: Normal heart sounds.  Pulmonary:     Effort: Pulmonary effort is normal.     Breath sounds: Normal breath sounds.  Abdominal:     General: Bowel sounds are normal.     Palpations: Abdomen is soft.  Musculoskeletal:        General: No tenderness or deformity. Normal range of motion.     Cervical back: Normal range of motion.  Lymphadenopathy:     Cervical: No cervical adenopathy.  Skin:    General: Skin is warm and dry.     Findings: No erythema or rash.  Neurological:     Mental Status: He is alert and oriented to person, place, and time.  Psychiatric:        Behavior: Behavior normal.        Thought Content: Thought content normal.        Judgment: Judgment normal.      Lab Results  Component Value Date   WBC 5.8 07/22/2019   HGB 13.4 07/22/2019   HCT 42.6 07/22/2019   MCV 89.5 07/22/2019   PLT 198 07/22/2019     Chemistry      Component Value Date/Time   NA 138 07/22/2019 1132   K 4.6 07/22/2019 1132   CL 103 07/22/2019 1132   CO2 30 07/22/2019 1132   BUN 17 07/22/2019 1132   CREATININE 1.15 07/22/2019 1132      Component Value Date/Time   CALCIUM 9.4 07/22/2019 1132   ALKPHOS 66 07/22/2019 1132   AST 17 07/22/2019 1132   ALT 14 07/22/2019 1132   BILITOT 0.6 07/22/2019 1132      Impression and Plan: Mr. Gascoigne is a very nice 86 year old white male.  He still has a decent performance status.  Again I think we should consider him for radioactive iodine.  I spoke with Dr. Francetta Found.  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  He has a lot of expertise with thyroid cancer therapy.  He actually has an office in [place] that he goes to a couple times a week.  Dr. Hartford Poli will be more than happy to see Mr. Eckert and talk to them about  whether or not radioactive iodine would be a good option for him.  It sounds like he will need to have some kind of thyroid scan to see if the tumors take up iodine.  If, for some reason, Mr. Rosensteel is not a candidate for radioactive iodine, I would consider him for oral therapy with Cabometyx.  I think this  would be reasonable.  It is unfortunate that we just cannot get enough tissue for molecular studies.  I am actually going to try to send blood off and do a liquid biopsy to see if we can see any molecular markers.  The situation with Mr. Filip is a complex.  I was with him for about 45 minutes.  Hopefully, Dr. Hartford Poli will be able to see Mr. Kina soon.  I will likely give Mr. Eckardt back in a few weeks.  I will have to be in consultation with Dr. Hartford Poli so we can work together on trying to treat this thyroid malignancy.  [name], MD 6/9/20215:12 PM

## 2019-07-23 NOTE — Telephone Encounter (Signed)
No los 6/8

## 2019-07-28 ENCOUNTER — Encounter: Payer: Self-pay | Admitting: *Deleted

## 2019-07-28 NOTE — Progress Notes (Signed)
Reviewed patient visit from last week. Patient needs to see Dr Hartford Poli. Reviewed appointments and found that his appointment had been scheduled for 09/04/2019. This is too far out and needs to be sooner.  Called Dr Shirlyn Goltz office. If patient can travel to the [place] location, they can see him this week. Called the patient and he is willing to travel to [place]. Instructed him to call the office to schedule the appointment, per Dr Shirlyn Goltz office instruction. He will call immediately.

## 2019-07-29 ENCOUNTER — Encounter: Payer: Self-pay | Admitting: *Deleted

## 2019-07-29 NOTE — Progress Notes (Signed)
Called patient to confirm his appointment being moved up. He states he called the office and was told nothing was available, and that he was on a waiting list.  I called the office back and spoke to [facility] The appointment for this week was no longer available, but she was able to move the appointment up to 08/06/19 at 11:20a at the [facility] location. Appointment was rescheduled.  Called and notified patient of new appointment date, time and location. He was appreciative of the earlier appointment.

## 2019-07-30 ENCOUNTER — Other Ambulatory Visit: Payer: Self-pay | Admitting: Hematology & Oncology

## 2019-07-31 LAB — THYROGLOBULIN LEVEL: Thyroglobulin: 20 ng/mL

## 2019-08-06 ENCOUNTER — Encounter: Payer: Self-pay | Admitting: *Deleted

## 2019-08-06 NOTE — Progress Notes (Signed)
Oncology Nurse Navigator Documentation  Oncology Nurse Navigator Flowsheets 08/06/2019  Navigator Follow Up Date: 09/12/2019  Navigator Follow Up Reason: Follow-up Appointment  Navigator Location [facility]  Referral Date to RadOnc/MedOnc -  Navigator Encounter Type Appt/Treatment Plan Review  Telephone -  Patient Visit Type MedOnc  Treatment Phase Pre-Tx/Tx Discussion  Barriers/Navigation Needs Coordination of Care  Education -  Interventions Coordination of Care  Acuity Level 2-Minimal Needs (1-2 Barriers Identified)  Referrals -  Coordination of Care Appts  Education Method -  Support Groups/Services Friends and Family  Time Spent with Patient 30

## 2019-08-13 ENCOUNTER — Telehealth: Payer: Self-pay | Admitting: Hematology & Oncology

## 2019-08-13 NOTE — Telephone Encounter (Signed)
Appointments changed updated calendar mailed 6/30 

## 2019-08-19 ENCOUNTER — Ambulatory Visit (INDEPENDENT_AMBULATORY_CARE_PROVIDER_SITE_OTHER): Payer: Medicare Other

## 2019-08-19 ENCOUNTER — Ambulatory Visit (INDEPENDENT_AMBULATORY_CARE_PROVIDER_SITE_OTHER): Payer: Medicare Other | Admitting: General Practice

## 2019-08-19 ENCOUNTER — Other Ambulatory Visit: Payer: Self-pay

## 2019-08-19 DIAGNOSIS — Z7901 Long term (current) use of anticoagulants: Secondary | ICD-10-CM

## 2019-08-19 DIAGNOSIS — Z0000 Encounter for general adult medical examination without abnormal findings: Secondary | ICD-10-CM

## 2019-08-19 DIAGNOSIS — Z86718 Personal history of other venous thrombosis and embolism: Secondary | ICD-10-CM

## 2019-08-19 LAB — POCT INR: INR: 2.4 (ref 2.0–3.0)

## 2019-08-19 NOTE — Patient Instructions (Addendum)
Pre visit review using our clinic review tool, if applicable. No additional management support is needed unless otherwise documented below in the visit note.  Continue to take 1 (5 mg) tablet daily.  Re-check in 6 weeks.

## 2019-08-19 NOTE — Patient Instructions (Addendum)
Clarence Hopkins , Thank you for taking time to come for your Medicare Wellness Visit. I appreciate your ongoing commitment to your health goals. Please review the following plan we discussed and let me know if I can assist you in the future.   Screening recommendations/referrals: Colonoscopy: not a candidate for colon cancer screening Recommended yearly ophthalmology/optometry visit for glaucoma screening and checkup Recommended yearly dental visit for hygiene and checkup  Vaccinations: Influenza vaccine: 11/14/2018 Pneumococcal vaccine: completed Tdap vaccine: overdue Shingles vaccine: never done   Covid-19: completed  Advanced directives: Advance directive discussed with you today. Even though you declined this today please call our office should you change your mind and we can give you the proper paperwork for you to fill out.  Conditions/risks identified: Please continue to do your personal lifestyle choices by: daily care of teeth and gums, regular physical activity (goal should be 5 days a week for 30 minutes), eat a healthy diet, avoid tobacco and drug use, limiting any alcohol intake, taking a low-dose aspirin (if not allergic or have been advised by your provider otherwise) and taking vitamins and minerals as recommended by your provider. Continue doing brain stimulating activities (puzzles, reading, adult coloring books, staying active) to keep memory sharp. Continue to eat heart healthy diet (full of fruits, vegetables, whole grains, lean protein, water--limit salt, fat, and sugar intake) and increase physical activity as tolerated.  Next appointment: Please schedule your next Medicare Wellness Visit with your Nurse Health Advisor in 1 year.  Preventive Care 84 Years and Older, Male Preventive care refers to lifestyle choices and visits with your health care provider that can promote health and wellness. What does preventive care include?  A yearly physical exam. This is also called an  annual well check.  Dental exams once or twice a year.  Routine eye exams. Ask your health care provider how often you should have your eyes checked.  Personal lifestyle choices, including:  Daily care of your teeth and gums.  Regular physical activity.  Eating a healthy diet.  Avoiding tobacco and drug use.  Limiting alcohol use.  Practicing safe sex.  Taking low doses of aspirin every day.  Taking vitamin and mineral supplements as recommended by your health care provider. What happens during an annual well check? The services and screenings done by your health care provider during your annual well check will depend on your age, overall health, lifestyle risk factors, and family history of disease. Counseling  Your health care provider may ask you questions about your:  Alcohol use.  Tobacco use.  Drug use.  Emotional well-being.  Home and relationship well-being.  Sexual activity.  Eating habits.  History of falls.  Memory and ability to understand (cognition).  Work and work Statistician. Screening  You may have the following tests or measurements:  Height, weight, and BMI.  Blood pressure.  Lipid and cholesterol levels. These may be checked every 5 years, or more frequently if you are over 84 years old.  Skin check.  Lung cancer screening. You may have this screening every year starting at age 84 if you have a 30-pack-year history of smoking and currently smoke or have quit within the past 15 years.  Fecal occult blood test (FOBT) of the stool. You may have this test every year starting at age 84.  Flexible sigmoidoscopy or colonoscopy. You may have a sigmoidoscopy every 5 years or a colonoscopy every 10 years starting at age 39.  Prostate cancer screening. Recommendations will vary  depending on your family history and other risks.  Hepatitis C blood test.  Hepatitis B blood test.  Sexually transmitted disease (STD) testing.  Diabetes  screening. This is done by checking your blood sugar (glucose) after you have not eaten for a while (fasting). You may have this done every 1-3 years.  Abdominal aortic aneurysm (AAA) screening. You may need this if you are a current or former smoker.  Osteoporosis. You may be screened starting at age 62 if you are at high risk. Talk with your health care provider about your test results, treatment options, and if necessary, the need for more tests. Vaccines  Your health care provider may recommend certain vaccines, such as:  Influenza vaccine. This is recommended every year.  Tetanus, diphtheria, and acellular pertussis (Tdap, Td) vaccine. You may need a Td booster every 10 years.  Zoster vaccine. You may need this after age 45.  Pneumococcal 13-valent conjugate (PCV13) vaccine. One dose is recommended after age 46.  Pneumococcal polysaccharide (PPSV23) vaccine. One dose is recommended after age 21. Talk to your health care provider about which screenings and vaccines you need and how often you need them. This information is not intended to replace advice given to you by your health care provider. Make sure you discuss any questions you have with your health care provider. Document Released: 02/26/2015 Document Revised: 10/20/2015 Document Reviewed: 12/01/2014 Elsevier Interactive Patient Education  2017 [place] Prevention in the Home Falls can cause injuries. They can happen to people of all ages. There are many things you can do to make your home safe and to help prevent falls. What can I do on the outside of my home?  Regularly fix the edges of walkways and driveways and fix any cracks.  Remove anything that might make you trip as you walk through a door, such as a raised step or threshold.  Trim any bushes or trees on the path to your home.  Use bright outdoor lighting.  Clear any walking paths of anything that might make someone trip, such as rocks or  tools.  Regularly check to see if handrails are loose or broken. Make sure that both sides of any steps have handrails.  Any raised decks and porches should have guardrails on the edges.  Have any leaves, snow, or ice cleared regularly.  Use sand or salt on walking paths during winter.  Clean up any spills in your garage right away. This includes oil or grease spills. What can I do in the bathroom?  Use night lights.  Install grab bars by the toilet and in the tub and shower. Do not use towel bars as grab bars.  Use non-skid mats or decals in the tub or shower.  If you need to sit down in the shower, use a plastic, non-slip stool.  Keep the floor dry. Clean up any water that spills on the floor as soon as it happens.  Remove soap buildup in the tub or shower regularly.  Attach bath mats securely with double-sided non-slip rug tape.  Do not have throw rugs and other things on the floor that can make you trip. What can I do in the bedroom?  Use night lights.  Make sure that you have a light by your bed that is easy to reach.  Do not use any sheets or blankets that are too big for your bed. They should not hang down onto the floor.  Have a firm chair that has side  arms. You can use this for support while you get dressed.  Do not have throw rugs and other things on the floor that can make you trip. What can I do in the kitchen?  Clean up any spills right away.  Avoid walking on wet floors.  Keep items that you use a lot in easy-to-reach places.  If you need to reach something above you, use a strong step stool that has a grab bar.  Keep electrical cords out of the way.  Do not use floor polish or wax that makes floors slippery. If you must use wax, use non-skid floor wax.  Do not have throw rugs and other things on the floor that can make you trip. What can I do with my stairs?  Do not leave any items on the stairs.  Make sure that there are handrails on both  sides of the stairs and use them. Fix handrails that are broken or loose. Make sure that handrails are as long as the stairways.  Check any carpeting to make sure that it is firmly attached to the stairs. Fix any carpet that is loose or worn.  Avoid having throw rugs at the top or bottom of the stairs. If you do have throw rugs, attach them to the floor with carpet tape.  Make sure that you have a light switch at the top of the stairs and the bottom of the stairs. If you do not have them, ask someone to add them for you. What else can I do to help prevent falls?  Wear shoes that:  Do not have high heels.  Have rubber bottoms.  Are comfortable and fit you well.  Are closed at the toe. Do not wear sandals.  If you use a stepladder:  Make sure that it is fully opened. Do not climb a closed stepladder.  Make sure that both sides of the stepladder are locked into place.  Ask someone to hold it for you, if possible.  Clearly mark and make sure that you can see:  Any grab bars or handrails.  First and last steps.  Where the edge of each step is.  Use tools that help you move around (mobility aids) if they are needed. These include:  Canes.  Walkers.  Scooters.  Crutches.  Turn on the lights when you go into a dark area. Replace any light bulbs as soon as they burn out.  Set up your furniture so you have a clear path. Avoid moving your furniture around.  If any of your floors are uneven, fix them.  If there are any pets around you, be aware of where they are.  Review your medicines with your doctor. Some medicines can make you feel dizzy. This can increase your chance of falling. Ask your doctor what other things that you can do to help prevent falls. This information is not intended to replace advice given to you by your health care provider. Make sure you discuss any questions you have with your health care provider. Document Released: 11/26/2008 Document Revised:  07/08/2015 Document Reviewed: 03/06/2014 Elsevier Interactive Patient Education  2017 [employer].

## 2019-08-19 NOTE — Progress Notes (Addendum)
I connected with [name]. Stairs today by telephone and verified that I am speaking with the correct person using two identifiers. Location patient: home Location provider: work Persons participating in the virtual visit: [name] and [name], LPN  I discussed the limitations, risks, security and privacy concerns of performing an evaluation and management service by telephone and the availability of in person appointments. I also discussed with the patient that there may be a patient responsible charge related to this service. The patient expressed understanding and verbally consented to this telephonic visit.    Interactive audio and video telecommunications were attempted between this provider and patient, however failed, due to patient having technical difficulties OR patient did not have access to video capability.  We continued and completed visit with audio only.  Some vital signs may be absent or patient reported.   Time Spent with patient on telephone encounter: 30 minutes  Subjective:   Clarence Hopkins is a 86 y.o. male who presents for Medicare Annual/Subsequent preventive examination.  Review of Systems    No ROS. Medicare Wellness Virtual Visit. Additional risk factors are reflected in social history. Cardiac Risk Factors include: advanced age (>42men, >69 women);hypertension;male gender Sleep Patterns: No sleep issues, feels rested on waking and sleeps 6-8 hours nightly. Home Safety/Smoke Alarms: Feels safe in home; uses home alarm. Smoke alarms in place. Living environment: 2-story home; Lives with spouse; no needs for DME; good family support system. Seat Belt Safety/Bike Helmet: Wears seat belt.    Objective:    There were no vitals filed for this visit. There is no height or weight on file to calculate BMI.  Advanced Directives 08/19/2019 07/22/2019 07/15/2019 07/04/2019 04/30/2018 03/05/2017 04/16/2013  Does Patient Have a Medical Advance Directive? No No No No  Yes Yes Patient has advance directive, copy not in chart  Type of Advance Directive - - - - Living will;Healthcare Power of [place];Living will Living will;Healthcare Power of Attorney  Does patient want to make changes to medical advance directive? - No - Patient declined - No - Patient declined - - No change requested  Copy of [place] in Chart? - - - - No - copy requested No - copy requested -  Would patient like information on creating a medical advance directive? No - Patient declined No - Patient declined No - Patient declined No - Patient declined - - -    Current Medications (verified) Outpatient Encounter Medications as of 08/19/2019  Medication Sig   b complex vitamins tablet Take 1 tablet by mouth daily.   diphenhydramine-acetaminophen (TYLENOL PM) 25-500 MG TABS tablet Take 1 tablet by mouth at bedtime.   Glycerin-Hypromellose-PEG 400 (DRY EYE RELIEF DROPS) 0.2-0.2-1 % SOLN Place 1 drop into both eyes daily as needed (Dry eye).   tamsulosin (FLOMAX) 0.4 MG CAPS capsule TAKE 1 CAPSULE BY MOUTH DAILY AFTER SUPPER (Patient taking differently: Take 0.4 mg by mouth at bedtime. )   warfarin (COUMADIN) 5 MG tablet TAKE 1 TABLET DAILY OR AS DIRECTED BY ANTICOAGULATION CLINIC (Patient taking differently: Take 5 mg by mouth daily. Take 1 tablet daily or as directed by anticoagulation clinic)   levothyroxine (SYNTHROID, LEVOTHROID) 175 MCG tablet Take 175 mcg by mouth daily.    [DISCONTINUED] warfarin (COUMADIN) 5 MG tablet Take 1 tablet daily or as directed by anticoagulation clinic   No facility-administered encounter medications on file as of 08/19/2019.    Allergies (verified) Patient has  no known allergies.   History: Past Medical History:  Diagnosis Date   Arthritis    Cancer ([place]) 2003   melanoma  L ear   GERD (gastroesophageal reflux disease)    Hypothyroidism    post op   Lung mass    Pulmonary embolism ([place]) 2005 & 2007    protein C deficiency   Past Surgical History:  Procedure Laterality Date   BRONCHIAL BRUSHINGS  07/15/2019   Procedure: BRONCHIAL BRUSHINGS;  Surgeon: Rigoberto Noel, MD;  Location: [place];  Service: Cardiopulmonary;;   BRONCHIAL WASHINGS  07/15/2019   Procedure: BRONCHIAL WASHINGS;  Surgeon: Rigoberto Noel, MD;  Location: [facility];  Service: Cardiopulmonary;;   CARDIAC CATHETERIZATION     > 20 years ago " everything was okay"    CERVICAL FUSION      X 2; Dr Vertell Limber   COLONOSCOPY     ENDOBRONCHIAL ULTRASOUND N/A 07/15/2019   Procedure: ENDOBRONCHIAL ULTRASOUND;  Surgeon: Rigoberto Noel, MD;  Location: [place];  Service: Cardiopulmonary;  Laterality: N/A;   FINE NEEDLE ASPIRATION  07/15/2019   Procedure: FINE NEEDLE ASPIRATION (FNA) LINEAR;  Surgeon: Rigoberto Noel, MD;  Location: [place];  Service: Cardiopulmonary;;   FINGER ARTHROPLASTY Right 04/17/2013   Procedure: IMPLANT ARTHROPLASTY RIGHT INDEX;  Surgeon: Cammie Sickle., MD;  Location: [place];  Service: Orthopedics;  Laterality: Right;   LUMBAR LAMINECTOMY  2010   Dr Becky Sax   MELANOMA EXCISION  2003   L ear   THYROIDECTOMY  1971   cytology indefinite   TONSILLECTOMY     TOTAL KNEE ARTHROPLASTY  2004   left   VIDEO BRONCHOSCOPY N/A 07/15/2019   Procedure: VIDEO BRONCHOSCOPY WITHOUT FLUORO;  Surgeon: Rigoberto Noel, MD;  Location: [place];  Service: Cardiopulmonary;  Laterality: N/A;   WISDOM TOOTH EXTRACTION     Family History  Problem Relation Age of Onset   Breast cancer Mother    Alzheimer's disease Father    Breast cancer Maternal Grandmother    Breast cancer Sister    Diabetes Neg Hx    Stroke Neg Hx    Heart disease Neg Hx    Social History   Socioeconomic History   Marital status: Married    Spouse name: Not on file   Number of children: 4   Years of education: 15   Highest education level: Not on file  Occupational History   Not on file  Tobacco Use   Smoking status:  Former Smoker    Packs/day: 0.75    Years: 41.00    Pack years: 30.75    Types: Cigarettes    Quit date: 02/13/1993    Years since quitting: 26.5   Smokeless tobacco: Never Used   Tobacco comment: smoked 1956-1995, up to 1/2 ppd  Vaping Use   Vaping Use: Never used  Substance and Sexual Activity   Alcohol use: Yes    Comment:  Vodka 2 oz/ night   Drug use: No   Sexual activity: Not on file  Other Topics Concern   Not on file  Social History Narrative   Fun: Plays golf   Denies abuse and feels safe at home   Social Determinants of Health   Financial Resource Strain: Low Risk    Difficulty of Paying Living Expenses: Not hard at all  Food Insecurity: No Food Insecurity   Worried About Charity fundraiser in the Last Year: Never true   Ran Out of  Food in the Last Year: Never true  Transportation Needs: No Transportation Needs   Lack of Transportation (Medical): No   Lack of Transportation (Non-Medical): No  Physical Activity: Sufficiently Active   Days of Exercise per Week: 5 days   Minutes of Exercise per Session: 30 min  Stress: No Stress Concern Present   Feeling of Stress : Not at all  Social Connections: Socially Integrated   Frequency of Communication with Friends and Family: More than three times a week   Frequency of Social Gatherings with Friends and Family: More than three times a week   Attends Religious Services: More than 4 times per year   Active Member of [employer] or Organizations: No   Attends Music therapist: More than 4 times per year   Marital Status: Married    Tobacco Counseling Counseling given: Not Answered Comment: smoked 1956-1995, up to 1/2 ppd   Clinical Intake:  Pre-visit preparation completed: Yes  Pain : No/denies pain     Nutritional Risks: None Diabetes: No  How often do you need to have someone help you when you read instructions, pamphlets, or other written materials from your doctor or pharmacy?: 1 - Never What is  the last grade level you completed in school?: Bachelor's Degree  Diabetic? no  Interpreter Needed?: No  Information entered by :: Derrik Mceachern N. Mallisa [place], LPN   Activities of Daily Living In your present state of health, do you have any difficulty performing the following activities: 08/19/2019  Hearing? N  Vision? N  Difficulty concentrating or making decisions? N  Walking or climbing stairs? N  Dressing or bathing? N  Doing errands, shopping? N  Preparing Food and eating ? N  Using the Toilet? N  In the past six months, have you accidently leaked urine? N  Do you have problems with loss of bowel control? N  Managing your Medications? N  Managing your Finances? N  Housekeeping or managing your Housekeeping? N  Some recent data might be hidden    Patient Care Team: Plotnikov, Evie Lacks, MD as PCP - General (Internal Medicine) Shon Hough, MD as Consulting Physician (Ophthalmology) [name], MD as Consulting Physician (Dermatology) Oneita Kras, MD as Referring Physician (Endocrinology) [name], MD as Medical Oncologist (Oncology) Cordelia Poche, RN as Oncology Nurse Navigator  Indicate any recent Medical Services you may have received from other than [facility] providers in the past year (date may be approximate).     Assessment:   This is a routine wellness examination for [place].  Hearing/Vision screen No exam data present  Dietary issues and exercise activities discussed: Current Exercise Habits: Home exercise routine, Type of exercise: Other - see comments (bike), Time (Minutes): 30, Frequency (Times/Week): 5, Weekly Exercise (Minutes/Week): 150, Intensity: Mild, Exercise limited by: respiratory conditions(s)  Goals      Patient Stated     Maintain current health status, continue to exercise, eat healthy, enjoy life and family.        Depression Screen PHQ 2/9 Scores 08/19/2019 03/10/2019 03/07/2018 03/05/2017 02/04/2016 02/02/2015  PHQ - 2 Score  0 0 0 0 0 0  PHQ- 9 Score - - - 3 - -    Fall Risk Fall Risk  08/19/2019 03/10/2019 03/07/2018 03/05/2017 02/04/2016  Falls in the past year? 0 0 0 No No  Number falls in past yr: 0 0 - - -  Injury with Fall? 0 0 - - -  Risk for fall due to : No  Fall Risks - - - -  Follow up Falls evaluation completed - Falls evaluation completed - -    Any stairs in or around the home? Yes  If so, are there any without handrails? No  Home free of loose throw rugs in walkways, pet beds, electrical cords, etc? Yes  Adequate lighting in your home to reduce risk of falls? Yes   ASSISTIVE DEVICES UTILIZED TO PREVENT FALLS:  Life alert? No  Use of a cane, walker or w/c? No  Grab bars in the bathroom? Yes  Shower chair or bench in shower? Yes  Elevated toilet seat or a handicapped toilet? No   TIMED UP AND GO:  Was the test performed? No .  Length of time to ambulate 10 feet: 0 sec.   Gait steady and fast without use of assistive device  Cognitive Function: MMSE - Mini Mental State Exam 03/05/2017  Orientation to time 5  Orientation to Place 5  Registration 3  Attention/ Calculation 5  Recall 1  Language- name 2 objects 2  Language- repeat 1  Language- follow 3 step command 3  Language- read & follow direction 1  Write a sentence 1  Copy design 1  Total score 28        Immunizations Immunization History  Administered Date(s) Administered   Influenza Split 11/13/2012   Influenza Whole 11/13/2009, 11/14/2011   Influenza, High Dose Seasonal PF 11/23/2015, 11/27/2016, 11/27/2017, 11/14/2018   Influenza-Unspecified 12/08/2013, 12/07/2014   PFIZER SARS-COV-2 Vaccination 03/02/2019, 03/19/2019   Pneumococcal Conjugate-13 03/05/2017   Pneumococcal Polysaccharide-23 02/04/2016   Zoster 03/14/2006    TDAP status: Due, Education has been provided regarding the importance of this vaccine. Advised may receive this vaccine at local pharmacy or Health Dept. Aware to provide a copy of the  vaccination record if obtained from local pharmacy or Health Dept. Verbalized acceptance and understanding. Flu Vaccine status: Up to date Pneumococcal vaccine status: Up to date Covid-19 vaccine status: Completed vaccines  Qualifies for Shingles Vaccine? Yes   Zostavax completed Yes   Shingrix Completed?: No.    Education has been provided regarding the importance of this vaccine. Patient has been advised to call insurance company to determine out of pocket expense if they have not yet received this vaccine. Advised may also receive vaccine at local pharmacy or Health Dept. Verbalized acceptance and understanding.  Screening Tests Health Maintenance  Topic Date Due   TETANUS/TDAP  Never done   INFLUENZA VACCINE  09/14/2019   COVID-19 Vaccine  Completed   PNA vac Low Risk Adult  Completed    Health Maintenance  Health Maintenance Due  Topic Date Due   TETANUS/TDAP  Never done    Colorectal cancer screening: No longer required.   Lung Cancer Screening: (Low Dose CT Chest recommended if Age 21-80 years, 30 pack-year currently smoking OR have quit w/in 15years.) does not qualify.   Lung Cancer Screening Referral: no  Additional Screening:  Hepatitis C Screening: does not qualify; Completed No  Vision Screening: Recommended annual ophthalmology exams for early detection of glaucoma and other disorders of the eye. Is the patient up to date with their annual eye exam?  Yes  Who is the provider or what is the name of the office in which the patient attends annual eye exams? Shon Hough, MD If pt is not established with a provider, would they like to be referred to a provider to establish care? No .   Dental Screening: Recommended annual dental exams for  proper oral hygiene  Community Resource Referral / Chronic Care Management: CRR required this visit?  No   CCM required this visit?  No      Plan:     I have personally reviewed and noted the following in the  patient's chart:   Medical and social history Use of alcohol, tobacco or illicit drugs  Current medications and supplements Functional ability and status Nutritional status Physical activity Advanced directives List of other physicians Hospitalizations, surgeries, and ER visits in previous 12 months Vitals Screenings to include cognitive, depression, and falls Referrals and appointments  In addition, I have reviewed and discussed with patient certain preventive protocols, quality metrics, and best practice recommendations. A written personalized care plan for preventive services as well as general preventive health recommendations were provided to patient.     Sheral Flow, LPN   [id number]   Nurse Notes:  Patient is cogitatively intact. There were no vitals filed for this visit. There is no height or weight on file to calculate BMI. Patient stated that he has no issues with gait or balance; does not use any assistive devices.  Medical screening examination/treatment/procedure(s) were performed by non-physician practitioner and as supervising physician I was immediately available for consultation/collaboration.  I agree with above. Lew Dawes, MD

## 2019-08-25 DIAGNOSIS — C73 Malignant neoplasm of thyroid gland: Secondary | ICD-10-CM | POA: Diagnosis not present

## 2019-08-25 DIAGNOSIS — C799 Secondary malignant neoplasm of unspecified site: Secondary | ICD-10-CM | POA: Diagnosis not present

## 2019-08-26 DIAGNOSIS — C801 Malignant (primary) neoplasm, unspecified: Secondary | ICD-10-CM | POA: Diagnosis not present

## 2019-08-26 DIAGNOSIS — C799 Secondary malignant neoplasm of unspecified site: Secondary | ICD-10-CM | POA: Diagnosis not present

## 2019-08-27 DIAGNOSIS — C73 Malignant neoplasm of thyroid gland: Secondary | ICD-10-CM | POA: Diagnosis not present

## 2019-08-27 DIAGNOSIS — C799 Secondary malignant neoplasm of unspecified site: Secondary | ICD-10-CM | POA: Diagnosis not present

## 2019-09-05 DIAGNOSIS — C73 Malignant neoplasm of thyroid gland: Secondary | ICD-10-CM | POA: Diagnosis not present

## 2019-09-05 DIAGNOSIS — C799 Secondary malignant neoplasm of unspecified site: Secondary | ICD-10-CM | POA: Diagnosis not present

## 2019-09-08 ENCOUNTER — Other Ambulatory Visit: Payer: Self-pay

## 2019-09-08 ENCOUNTER — Ambulatory Visit (INDEPENDENT_AMBULATORY_CARE_PROVIDER_SITE_OTHER): Payer: Medicare Other | Admitting: Internal Medicine

## 2019-09-08 ENCOUNTER — Encounter: Payer: Self-pay | Admitting: Internal Medicine

## 2019-09-08 DIAGNOSIS — C73 Malignant neoplasm of thyroid gland: Secondary | ICD-10-CM | POA: Diagnosis not present

## 2019-09-08 DIAGNOSIS — E890 Postprocedural hypothyroidism: Secondary | ICD-10-CM | POA: Diagnosis not present

## 2019-09-08 DIAGNOSIS — R0609 Other forms of dyspnea: Secondary | ICD-10-CM

## 2019-09-08 DIAGNOSIS — D6859 Other primary thrombophilia: Secondary | ICD-10-CM | POA: Diagnosis not present

## 2019-09-08 DIAGNOSIS — R0600 Dyspnea, unspecified: Secondary | ICD-10-CM | POA: Diagnosis not present

## 2019-09-08 NOTE — Assessment & Plan Note (Addendum)
Post XRT Iodine - better Labs w/Dr Marin Olp

## 2019-09-08 NOTE — Assessment & Plan Note (Signed)
Dr Marin Olp, Dr [place] Poli. S/p radioactive Iodine Rx

## 2019-09-08 NOTE — Assessment & Plan Note (Signed)
On Coumadin 

## 2019-09-08 NOTE — Progress Notes (Signed)
Subjective:  Patient ID: Clarence Hopkins, male    DOB: 1930-06-25  Age: 86 y.o. MRN: [id number]  CC: No chief complaint on file.   HPI Clarence Hopkins presents for thyroid cancer, hypothyroidism, anticoagulation f/u S/p XRT iodine Rx Due tDAP  Outpatient Medications Prior to Visit  Medication Sig Dispense Refill   b complex vitamins tablet Take 1 tablet by mouth daily. 100 tablet 3   diphenhydramine-acetaminophen (TYLENOL PM) 25-500 MG TABS tablet Take 1 tablet by mouth at bedtime.     Glycerin-Hypromellose-PEG 400 (DRY EYE RELIEF DROPS) 0.2-0.2-1 % SOLN Place 1 drop into both eyes daily as needed (Dry eye).     tamsulosin (FLOMAX) 0.4 MG CAPS capsule TAKE 1 CAPSULE BY MOUTH DAILY AFTER SUPPER (Patient taking differently: Take 0.4 mg by mouth at bedtime. ) 90 capsule 3   warfarin (COUMADIN) 5 MG tablet TAKE 1 TABLET DAILY OR AS DIRECTED BY ANTICOAGULATION CLINIC (Patient taking differently: Take 5 mg by mouth daily. Take 1 tablet daily or as directed by anticoagulation clinic) 100 tablet 1   levothyroxine (SYNTHROID, LEVOTHROID) 175 MCG tablet Take 175 mcg by mouth daily.      No facility-administered medications prior to visit.    ROS: Review of Systems  Constitutional: Positive for fatigue. Negative for appetite change and unexpected weight change.  HENT: Negative for congestion, nosebleeds, sneezing, sore throat and trouble swallowing.   Eyes: Negative for itching and visual disturbance.  Respiratory: Positive for shortness of breath. Negative for cough.   Cardiovascular: Negative for chest pain, palpitations and leg swelling.  Gastrointestinal: Negative for abdominal distention, blood in stool, diarrhea and nausea.  Genitourinary: Negative for frequency and hematuria.  Musculoskeletal: Negative for arthralgias, back pain, gait problem, joint swelling and neck pain.  Skin: Negative for rash.  Neurological: Positive for weakness. Negative for dizziness, tremors and speech  difficulty.  Psychiatric/Behavioral: Negative for agitation, dysphoric mood and sleep disturbance. The patient is not nervous/anxious.     Objective:  BP 112/68 (BP Location: Right Arm, Patient Position: Sitting, Cuff Size: Large)    Pulse 66    Temp 98 F (36.7 C) (Oral)    Ht 6\' 3"  (1.905 m)    Wt 195 lb (88.5 kg)    SpO2 95%    BMI 24.37 kg/m   BP Readings from Last 3 Encounters:  09/08/19 112/68  07/22/19 (!) 152/79  07/15/19 (!) 168/68    Wt Readings from Last 3 Encounters:  09/08/19 195 lb (88.5 kg)  07/22/19 201 lb (91.2 kg)  07/15/19 198 lb (89.8 kg)    Physical Exam Constitutional:      General: He is not in acute distress.    Appearance: He is well-developed.     Comments: NAD  Eyes:     Conjunctiva/sclera: Conjunctivae normal.     Pupils: Pupils are equal, round, and reactive to light.  Neck:     Thyroid: No thyromegaly.     Vascular: No JVD.  Cardiovascular:     Rate and Rhythm: Normal rate and regular rhythm.     Heart sounds: Normal heart sounds. No murmur heard.  No friction rub. No gallop.   Pulmonary:     Effort: Pulmonary effort is normal. No respiratory distress.     Breath sounds: Normal breath sounds. No wheezing or rales.  Chest:     Chest wall: No tenderness.  Abdominal:     General: Bowel sounds are normal. There is no distension.     Palpations:  Abdomen is soft. There is no mass.     Tenderness: There is no abdominal tenderness. There is no guarding or rebound.  Musculoskeletal:        General: No tenderness. Normal range of motion.     Cervical back: Normal range of motion.  Lymphadenopathy:     Cervical: No cervical adenopathy.  Skin:    General: Skin is warm and dry.     Findings: No rash.  Neurological:     Mental Status: He is alert and oriented to person, place, and time.     Cranial Nerves: No cranial nerve deficit.     Motor: No abnormal muscle tone.     Coordination: Coordination normal.     Gait: Gait normal.     Deep  Tendon Reflexes: Reflexes are normal and symmetric.  Psychiatric:        Behavior: Behavior normal.        Thought Content: Thought content normal.        Judgment: Judgment normal.    Skin lesions  Lab Results  Component Value Date   WBC 5.8 07/22/2019   HGB 13.4 07/22/2019   HCT 42.6 07/22/2019   PLT 198 07/22/2019   GLUCOSE 90 07/22/2019   CHOL 141 03/14/2019   TRIG 74.0 03/14/2019   HDL 38.00 (L) 03/14/2019   LDLDIRECT 116.0 03/31/2013   LDLCALC 88 03/14/2019   ALT 14 07/22/2019   AST 17 07/22/2019   NA 138 07/22/2019   K 4.6 07/22/2019   CL 103 07/22/2019   CREATININE 1.15 07/22/2019   BUN 17 07/22/2019   CO2 30 07/22/2019   TSH 0.67 06/03/2019   PSA 9.86 (H) 03/14/2019   INR 2.4 08/19/2019    No results found.  Assessment & Plan:    Clarence Kehr, MD

## 2019-09-08 NOTE — Assessment & Plan Note (Signed)
On Levothroid 

## 2019-09-09 ENCOUNTER — Other Ambulatory Visit: Payer: Self-pay | Admitting: *Deleted

## 2019-09-09 DIAGNOSIS — C73 Malignant neoplasm of thyroid gland: Secondary | ICD-10-CM

## 2019-09-10 ENCOUNTER — Other Ambulatory Visit: Payer: Self-pay

## 2019-09-10 ENCOUNTER — Encounter: Payer: Self-pay | Admitting: Hematology & Oncology

## 2019-09-10 ENCOUNTER — Inpatient Hospital Stay: Payer: Medicare Other | Attending: Hematology & Oncology

## 2019-09-10 ENCOUNTER — Telehealth: Payer: Self-pay | Admitting: Hematology & Oncology

## 2019-09-10 ENCOUNTER — Inpatient Hospital Stay (HOSPITAL_BASED_OUTPATIENT_CLINIC_OR_DEPARTMENT_OTHER): Payer: Medicare Other | Admitting: Hematology & Oncology

## 2019-09-10 VITALS — BP 131/68 | HR 66 | Temp 98.1°F | Resp 18 | Wt 194.3 lb

## 2019-09-10 DIAGNOSIS — C73 Malignant neoplasm of thyroid gland: Secondary | ICD-10-CM

## 2019-09-10 DIAGNOSIS — Z7901 Long term (current) use of anticoagulants: Secondary | ICD-10-CM | POA: Diagnosis not present

## 2019-09-10 LAB — CBC WITH DIFFERENTIAL (CANCER CENTER ONLY)
Abs Immature Granulocytes: 0.02 10*3/uL (ref 0.00–0.07)
Basophils Absolute: 0 10*3/uL (ref 0.0–0.1)
Basophils Relative: 0 %
Eosinophils Absolute: 0.1 10*3/uL (ref 0.0–0.5)
Eosinophils Relative: 2 %
HCT: 41.9 % (ref 39.0–52.0)
Hemoglobin: 12.9 g/dL — ABNORMAL LOW (ref 13.0–17.0)
Immature Granulocytes: 0 %
Lymphocytes Relative: 17 %
Lymphs Abs: 1.1 10*3/uL (ref 0.7–4.0)
MCH: 27.6 pg (ref 26.0–34.0)
MCHC: 30.8 g/dL (ref 30.0–36.0)
MCV: 89.5 fL (ref 80.0–100.0)
Monocytes Absolute: 0.7 10*3/uL (ref 0.1–1.0)
Monocytes Relative: 11 %
Neutro Abs: 4.6 10*3/uL (ref 1.7–7.7)
Neutrophils Relative %: 70 %
Platelet Count: 281 10*3/uL (ref 150–400)
RBC: 4.68 MIL/uL (ref 4.22–5.81)
RDW: 12.5 % (ref 11.5–15.5)
WBC Count: 6.5 10*3/uL (ref 4.0–10.5)
nRBC: 0 % (ref 0.0–0.2)

## 2019-09-10 LAB — CMP (CANCER CENTER ONLY)
ALT: 20 U/L (ref 0–44)
AST: 17 U/L (ref 15–41)
Albumin: 3.9 g/dL (ref 3.5–5.0)
Alkaline Phosphatase: 144 U/L — ABNORMAL HIGH (ref 38–126)
Anion gap: 4 — ABNORMAL LOW (ref 5–15)
BUN: 16 mg/dL (ref 8–23)
CO2: 32 mmol/L (ref 22–32)
Calcium: 9.5 mg/dL (ref 8.9–10.3)
Chloride: 101 mmol/L (ref 98–111)
Creatinine: 1 mg/dL (ref 0.61–1.24)
GFR, Est AFR Am: >60 mL/min (ref >60)
GFR, Estimated: >60 mL/min (ref >60)
Glucose, Bld: 109 mg/dL — ABNORMAL HIGH (ref 70–99)
Potassium: 5 mmol/L (ref 3.5–5.1)
Sodium: 137 mmol/L (ref 135–145)
Total Bilirubin: 0.4 mg/dL (ref 0.3–1.2)
Total Protein: 7 g/dL (ref 6.5–8.1)

## 2019-09-10 NOTE — Progress Notes (Signed)
Hematology and Oncology Follow Up Visit  ZOLTAN GENEST [id number] 05-29-1930 86 y.o. 09/10/2019   Principle Diagnosis:   Metastatic papillary carcinoma of the thyroid  Current Therapy:    S/p I-131 therapy on 08/27/2019     Interim History:  Mr. Schill is back for follow-up.  He has been seen by Dr. Hartford Poli over at the [facility] system.  Dr. Hartford Poli did a fantastic job with him.  He went ahead and gave him radioactive iodine (I 131).  This was done on 08/27/2019.  He received 158.7 mCi of iodine.  He had a tough time with this according to his wife.  He had a decreased appetite.  He was incredibly emotional.  He had a lot of achiness.  I follow-up scan done on 09/05/2019 showed residual activity in the thyroid bed but no evidence of metastatic disease.  Hopefully, this is indicative of a response.  He is doing okay right now.  Again his appetite is down but he seems to begin this back a little bit.  He has some dizziness but this has been chronic.  He is on Coumadin.  He is going to see the cardiologist to have the Coumadin checked in a week or so.  There is been no problems with nausea or vomiting.  There has been no diarrhea.  There has been no bleeding.  He has had no cough or shortness of breath.  He wants to know if he can play golf.  I told him that he cannot play golf with a temperature over 85 degrees.  Overall, I would say his performance status is ECOG 1.  Medications:  Current Outpatient Medications:  .  b complex vitamins tablet, Take 1 tablet by mouth daily., Disp: 100 tablet, Rfl: 3 .  diphenhydramine-acetaminophen (TYLENOL PM) 25-500 MG TABS tablet, Take 1 tablet by mouth at bedtime., Disp: , Rfl:  .  Glycerin-Hypromellose-PEG 400 (DRY EYE RELIEF DROPS) 0.2-0.2-1 % SOLN, Place 1 drop into both eyes daily as needed (Dry eye)., Disp: , Rfl:  .  tamsulosin (FLOMAX) 0.4 MG CAPS capsule, TAKE 1 CAPSULE BY MOUTH DAILY AFTER SUPPER (Patient taking differently: Take 0.4 mg by  mouth at bedtime. ), Disp: 90 capsule, Rfl: 3 .  warfarin (COUMADIN) 5 MG tablet, TAKE 1 TABLET DAILY OR AS DIRECTED BY ANTICOAGULATION CLINIC (Patient taking differently: Take 5 mg by mouth daily. Take 1 tablet daily or as directed by anticoagulation clinic), Disp: 100 tablet, Rfl: 1 .  levothyroxine (SYNTHROID, LEVOTHROID) 175 MCG tablet, Take 175 mcg by mouth daily. , Disp: , Rfl:   Allergies: No Known Allergies  Past Medical History, Surgical history, Social history, and Family History were reviewed and updated.  Review of Systems: Review of Systems  Constitutional: Negative.   HENT:  Negative.   Eyes: Negative.   Respiratory: Negative.   Cardiovascular: Negative.   Gastrointestinal: Negative.   Endocrine: Negative.   Genitourinary: Negative.    Musculoskeletal: Negative.   Skin: Negative.   Neurological: Negative.   Hematological: Negative.   Psychiatric/Behavioral: Negative.     Physical Exam:  weight is 194 lb 5 oz (88.1 kg). His oral temperature is 98.1 F (36.7 C). His blood pressure is 131/68 (abnormal) and his pulse is 66. His respiration is 18 and oxygen saturation is 96%.   Wt Readings from Last 3 Encounters:  09/10/19 194 lb 5 oz (88.1 kg)  09/08/19 195 lb (88.5 kg)  07/22/19 201 lb (91.2 kg)    Physical Exam Vitals  reviewed.  HENT:     Head: Normocephalic and atraumatic.  Eyes:     Pupils: Pupils are equal, round, and reactive to light.  Cardiovascular:     Rate and Rhythm: Normal rate and regular rhythm.     Heart sounds: Normal heart sounds.  Pulmonary:     Effort: Pulmonary effort is normal.     Breath sounds: Normal breath sounds.  Abdominal:     General: Bowel sounds are normal.     Palpations: Abdomen is soft.  Musculoskeletal:        General: No tenderness or deformity. Normal range of motion.     Cervical back: Normal range of motion.  Lymphadenopathy:     Cervical: No cervical adenopathy.  Skin:    General: Skin is warm and dry.      Findings: No erythema or rash.  Neurological:     Mental Status: He is alert and oriented to person, place, and time.  Psychiatric:        Behavior: Behavior normal.        Thought Content: Thought content normal.        Judgment: Judgment normal.      Lab Results  Component Value Date   WBC 6.5 09/10/2019   HGB 12.9 (L) 09/10/2019   HCT 41.9 09/10/2019   MCV 89.5 09/10/2019   PLT 281 09/10/2019     Chemistry      Component Value Date/Time   NA 137 09/10/2019 1200   K 5.0 09/10/2019 1200   CL 101 09/10/2019 1200   CO2 32 09/10/2019 1200   BUN 16 09/10/2019 1200   CREATININE 1.00 09/10/2019 1200      Component Value Date/Time   CALCIUM 9.5 09/10/2019 1200   ALKPHOS 144 (H) 09/10/2019 1200   AST 17 09/10/2019 1200   ALT 20 09/10/2019 1200   BILITOT 0.4 09/10/2019 1200      Impression and Plan: Mr. Capizzi is a very nice 86 year old white male.  He still has a decent performance status.  Hopefully, the radioactive iodine is helping.  I really think that it will help.  I would like to get another CT scan on him so we can see how everything looks.  I will probably get one in a couple weeks.  I am just happy that the quality of life for Mr. Pyon is maintaining itself.  His birthday is coming up in a couple weeks.  I am sure that he will have a wonderful birthday.  I think he goes back to see Dr. Hartford Poli in a week or so.  If, another dose of iodine is needed, I am sure that Mr. Hippert would be able to tolerate this.  I would not think that the dose would nearly be as high as he had received initially.  I would like to see him back in 1 month for follow-up.    [name], MD 7/28/20212:14 PM

## 2019-09-10 NOTE — Telephone Encounter (Signed)
Appointments scheduled per verbal from Dr Marin Olp for end of August.  He then entered the visit summary and it stated middle of August.  Appointments were already scheduled and calendar printed prior to visit summary being entered per 7/28 los

## 2019-09-12 ENCOUNTER — Encounter: Payer: Self-pay | Admitting: *Deleted

## 2019-09-12 ENCOUNTER — Other Ambulatory Visit: Payer: Medicare Other

## 2019-09-12 ENCOUNTER — Ambulatory Visit: Payer: Medicare Other | Admitting: Hematology & Oncology

## 2019-09-12 NOTE — Progress Notes (Signed)
Patient needs CTs scheduled. Waiting on PA. Will follow up next week for insurance authorization and scheduling.  Oncology Nurse Navigator Documentation  Oncology Nurse Navigator Flowsheets 09/12/2019  Navigator Follow Up Date: 10/10/2019  Navigator Follow Up Reason: Follow-up Appointment  Navigator Location [facility]  Referral Date to RadOnc/MedOnc -  Navigator Encounter Type Appt/Treatment Plan Review  Telephone -  Patient Visit Type MedOnc  Treatment Phase Active Tx  Barriers/Navigation Needs Coordination of Care  Education -  Interventions Coordination of Care  Acuity Level 2-Minimal Needs (1-2 Barriers Identified)  Referrals -  Coordination of Care Radiology  Education Method -  Support Groups/Services -  Time Spent with Patient 15

## 2019-09-17 DIAGNOSIS — C73 Malignant neoplasm of thyroid gland: Secondary | ICD-10-CM | POA: Diagnosis not present

## 2019-09-17 DIAGNOSIS — C799 Secondary malignant neoplasm of unspecified site: Secondary | ICD-10-CM | POA: Diagnosis not present

## 2019-09-17 DIAGNOSIS — E890 Postprocedural hypothyroidism: Secondary | ICD-10-CM | POA: Diagnosis not present

## 2019-09-26 ENCOUNTER — Ambulatory Visit (HOSPITAL_COMMUNITY): Payer: Medicare Other

## 2019-09-29 ENCOUNTER — Ambulatory Visit (HOSPITAL_COMMUNITY)
Admission: RE | Admit: 2019-09-29 | Discharge: 2019-09-29 | Disposition: A | Discharge status: Home / Self Care | Payer: Medicare Other | Source: Ambulatory Visit | Attending: Hematology & Oncology | Admitting: Hematology & Oncology

## 2019-09-29 ENCOUNTER — Other Ambulatory Visit: Payer: Self-pay

## 2019-09-29 ENCOUNTER — Encounter: Payer: Self-pay | Admitting: *Deleted

## 2019-09-29 DIAGNOSIS — I2510 Atherosclerotic heart disease of native coronary artery without angina pectoris: Secondary | ICD-10-CM | POA: Diagnosis not present

## 2019-09-29 DIAGNOSIS — I700 Atherosclerosis of aorta: Secondary | ICD-10-CM | POA: Diagnosis not present

## 2019-09-29 DIAGNOSIS — C73 Malignant neoplasm of thyroid gland: Secondary | ICD-10-CM

## 2019-09-29 DIAGNOSIS — R918 Other nonspecific abnormal finding of lung field: Secondary | ICD-10-CM | POA: Diagnosis not present

## 2019-09-29 DIAGNOSIS — C7801 Secondary malignant neoplasm of right lung: Secondary | ICD-10-CM | POA: Diagnosis not present

## 2019-09-29 MED ORDER — SODIUM CHLORIDE (PF) 0.9 % IJ SOLN
INTRAMUSCULAR | Status: AC
  Filled 2019-09-29: qty 50

## 2019-09-29 MED ORDER — IOHEXOL 300 MG/ML  SOLN
75.0000 mL | Freq: Once | INTRAMUSCULAR | Status: AC | PRN
  Administered 2019-09-29: 75 mL via INTRAVENOUS

## 2019-09-29 NOTE — Progress Notes (Signed)
Oncology Nurse Navigator Documentation  Oncology Nurse Navigator Flowsheets 09/29/2019  Navigator Follow Up Date: 10/10/2019  Navigator Follow Up Reason: Follow-up Appointment  Navigator Location [facility]  Referral Date to RadOnc/MedOnc -  Navigator Encounter Type Scan Review  Telephone -  Patient Visit Type -  Treatment Phase -  Barriers/Navigation Needs -  Education -  Interventions -  Acuity -  Referrals -  Coordination of Care -  Education Method -  Support Groups/Services -  Time Spent with Patient 15

## 2019-09-30 ENCOUNTER — Telehealth: Payer: Self-pay | Admitting: *Deleted

## 2019-09-30 ENCOUNTER — Ambulatory Visit (INDEPENDENT_AMBULATORY_CARE_PROVIDER_SITE_OTHER): Payer: Medicare Other | Admitting: General Practice

## 2019-09-30 DIAGNOSIS — Z7901 Long term (current) use of anticoagulants: Secondary | ICD-10-CM

## 2019-09-30 DIAGNOSIS — Z86718 Personal history of other venous thrombosis and embolism: Secondary | ICD-10-CM | POA: Diagnosis not present

## 2019-09-30 LAB — POCT INR: INR: 3.1 — AB (ref 2.0–3.0)

## 2019-09-30 NOTE — Telephone Encounter (Signed)
As noted below by Dr. Marin Olp, I left a message informing the patient that the CT scan showed some decrease in the right lung mass. This is very good. Instructed him to call the office if he had any questions or concerns.

## 2019-09-30 NOTE — Telephone Encounter (Signed)
-----   Message from [name], MD sent at 09/29/2019  4:22 PM EDT ----- Call - the CT scan shows some decrease in the right lung mass!!! This is very good!!  Film/video editor

## 2019-09-30 NOTE — Patient Instructions (Addendum)
Pre visit review using our clinic review tool, if applicable. No additional management support is needed unless otherwise documented below in the visit note.  Take 1/2 tablet today and then continue to take 1 (5 mg) tablet daily.  Re-check in 4 weeks.

## 2019-10-10 ENCOUNTER — Inpatient Hospital Stay: Payer: Medicare Other

## 2019-10-10 ENCOUNTER — Inpatient Hospital Stay: Payer: Medicare Other | Admitting: Hematology & Oncology

## 2019-10-21 ENCOUNTER — Telehealth: Payer: Self-pay | Admitting: Hematology & Oncology

## 2019-10-21 ENCOUNTER — Encounter: Payer: Self-pay | Admitting: Hematology & Oncology

## 2019-10-21 ENCOUNTER — Encounter: Payer: Self-pay | Admitting: *Deleted

## 2019-10-21 ENCOUNTER — Inpatient Hospital Stay (HOSPITAL_BASED_OUTPATIENT_CLINIC_OR_DEPARTMENT_OTHER): Payer: Medicare Other | Admitting: Hematology & Oncology

## 2019-10-21 ENCOUNTER — Other Ambulatory Visit: Payer: Self-pay

## 2019-10-21 ENCOUNTER — Inpatient Hospital Stay: Payer: Medicare Other | Attending: Hematology & Oncology

## 2019-10-21 VITALS — BP 133/71 | HR 72 | Temp 97.6°F | Resp 20 | Wt 200.0 lb

## 2019-10-21 DIAGNOSIS — C73 Malignant neoplasm of thyroid gland: Secondary | ICD-10-CM | POA: Insufficient documentation

## 2019-10-21 LAB — CBC WITH DIFFERENTIAL (CANCER CENTER ONLY)
Abs Immature Granulocytes: 0.01 10*3/uL (ref 0.00–0.07)
Basophils Absolute: 0 10*3/uL (ref 0.0–0.1)
Basophils Relative: 0 %
Eosinophils Absolute: 0.1 10*3/uL (ref 0.0–0.5)
Eosinophils Relative: 1 %
HCT: 40.4 % (ref 39.0–52.0)
Hemoglobin: 12.7 g/dL — ABNORMAL LOW (ref 13.0–17.0)
Immature Granulocytes: 0 %
Lymphocytes Relative: 27 %
Lymphs Abs: 1.2 10*3/uL (ref 0.7–4.0)
MCH: 28 pg (ref 26.0–34.0)
MCHC: 31.4 g/dL (ref 30.0–36.0)
MCV: 89.2 fL (ref 80.0–100.0)
Monocytes Absolute: 0.4 10*3/uL (ref 0.1–1.0)
Monocytes Relative: 9 %
Neutro Abs: 2.8 10*3/uL (ref 1.7–7.7)
Neutrophils Relative %: 63 %
Platelet Count: 167 10*3/uL (ref 150–400)
RBC: 4.53 MIL/uL (ref 4.22–5.81)
RDW: 12.9 % (ref 11.5–15.5)
WBC Count: 4.5 10*3/uL (ref 4.0–10.5)
nRBC: 0 % (ref 0.0–0.2)

## 2019-10-21 LAB — CMP (CANCER CENTER ONLY)
ALT: 14 U/L (ref 0–44)
AST: 18 U/L (ref 15–41)
Albumin: 3.6 g/dL (ref 3.5–5.0)
Alkaline Phosphatase: 78 U/L (ref 38–126)
Anion gap: 5 (ref 5–15)
BUN: 17 mg/dL (ref 8–23)
CO2: 31 mmol/L (ref 22–32)
Calcium: 9.6 mg/dL (ref 8.9–10.3)
Chloride: 103 mmol/L (ref 98–111)
Creatinine: 1.11 mg/dL (ref 0.61–1.24)
GFR, Est AFR Am: >60 mL/min (ref >60)
GFR, Estimated: 59 mL/min — ABNORMAL LOW (ref >60)
Glucose, Bld: 103 mg/dL — ABNORMAL HIGH (ref 70–99)
Potassium: 4.4 mmol/L (ref 3.5–5.1)
Sodium: 139 mmol/L (ref 135–145)
Total Bilirubin: 0.6 mg/dL (ref 0.3–1.2)
Total Protein: 6.5 g/dL (ref 6.5–8.1)

## 2019-10-21 NOTE — Telephone Encounter (Signed)
Appointments scheduled calendar printed per 9/7 los

## 2019-10-21 NOTE — Progress Notes (Signed)
Hematology and Oncology Follow Up Visit  Clarence Hopkins [id number] 07-01-1930 86 y.o. 10/21/2019   Principle Diagnosis:   Metastatic papillary carcinoma of the thyroid  Current Therapy:    S/p I-131 therapy on 08/27/2019     Interim History:  Clarence Hopkins is back for follow-up.  He actually seems to be doing pretty well.  He still has some fatigue.  This might be from the I-131 therapy that he took.  He probably took this probably close to 2 months ago.  We did go ahead and repeat a CT scan on him back in August.  This was done on August 16.  The CT scan showed decrease in the central right lower lobe mass.  Now measures 4.6 x 5.4 cm.  There is stable bilateral pulmonary nodules with the largest measuring 2.5 x 2.5 cm in the medial left lobe.  There is a mass in the right hepatic lobe measuring 2.5 x 2.9 cm which also appears stable.  Again I am not sure if this is truly a mass or if it is a cyst.  Hopefully, we will see that he will continue to have a response.  He has had no problems with pain.  He has had no problems with his appetite.  He has had no nausea or vomiting.  There is been no bleeding.  He has had no fever.  He and his wife are being very conscientious with the coronavirus.    He is on Coumadin.  He seems to be doing well on Coumadin.  This is being monitored by his cardiologist.    Overall, I would say his performance status is ECOG 1.  Medications:  Current Outpatient Medications:  .  b complex vitamins tablet, Take 1 tablet by mouth daily., Disp: 100 tablet, Rfl: 3 .  diphenhydramine-acetaminophen (TYLENOL PM) 25-500 MG TABS tablet, Take 1 tablet by mouth at bedtime., Disp: , Rfl:  .  Glycerin-Hypromellose-PEG 400 (DRY EYE RELIEF DROPS) 0.2-0.2-1 % SOLN, Place 1 drop into both eyes daily as needed (Dry eye)., Disp: , Rfl:  .  levothyroxine (SYNTHROID) 175 MCG tablet, Take 175 mcg by mouth daily before breakfast., Disp: , Rfl:  .  tamsulosin (FLOMAX) 0.4 MG CAPS capsule,  TAKE 1 CAPSULE BY MOUTH DAILY AFTER SUPPER (Patient taking differently: Take 0.4 mg by mouth at bedtime. ), Disp: 90 capsule, Rfl: 3 .  warfarin (COUMADIN) 5 MG tablet, TAKE 1 TABLET DAILY OR AS DIRECTED BY ANTICOAGULATION CLINIC (Patient taking differently: Take 5 mg by mouth daily. Take 1 tablet daily or as directed by anticoagulation clinic), Disp: 100 tablet, Rfl: 1 .  levothyroxine (SYNTHROID, LEVOTHROID) 175 MCG tablet, Take 175 mcg by mouth daily. , Disp: , Rfl:   Allergies: No Known Allergies  Past Medical History, Surgical history, Social history, and Family History were reviewed and updated.  Review of Systems: Review of Systems  Constitutional: Negative.   HENT:  Negative.   Eyes: Negative.   Respiratory: Negative.   Cardiovascular: Negative.   Gastrointestinal: Negative.   Endocrine: Negative.   Genitourinary: Negative.    Musculoskeletal: Negative.   Skin: Negative.   Neurological: Negative.   Hematological: Negative.   Psychiatric/Behavioral: Negative.     Physical Exam:  weight is 200 lb (90.7 kg). His oral temperature is 97.6 F (36.4 C). His blood pressure is 133/71 and his pulse is 72. His respiration is 20 and oxygen saturation is 98%.   Wt Readings from Last 3 Encounters:  10/21/19 200 lb (  90.7 kg)  09/10/19 194 lb 5 oz (88.1 kg)  09/08/19 195 lb (88.5 kg)    Physical Exam Vitals reviewed.  HENT:     Head: Normocephalic and atraumatic.  Eyes:     Pupils: Pupils are equal, round, and reactive to light.  Cardiovascular:     Rate and Rhythm: Normal rate and regular rhythm.     Heart sounds: Normal heart sounds.  Pulmonary:     Effort: Pulmonary effort is normal.     Breath sounds: Normal breath sounds.  Abdominal:     General: Bowel sounds are normal.     Palpations: Abdomen is soft.  Musculoskeletal:        General: No tenderness or deformity. Normal range of motion.     Cervical back: Normal range of motion.  Lymphadenopathy:     Cervical: No  cervical adenopathy.  Skin:    General: Skin is warm and dry.     Findings: No erythema or rash.  Neurological:     Mental Status: He is alert and oriented to person, place, and time.  Psychiatric:        Behavior: Behavior normal.        Thought Content: Thought content normal.        Judgment: Judgment normal.      Lab Results  Component Value Date   WBC 4.5 10/21/2019   HGB 12.7 (L) 10/21/2019   HCT 40.4 10/21/2019   MCV 89.2 10/21/2019   PLT 167 10/21/2019     Chemistry      Component Value Date/Time   NA 139 10/21/2019 1306   K 4.4 10/21/2019 1306   CL 103 10/21/2019 1306   CO2 31 10/21/2019 1306   BUN 17 10/21/2019 1306   CREATININE 1.11 10/21/2019 1306      Component Value Date/Time   CALCIUM 9.6 10/21/2019 1306   ALKPHOS 78 10/21/2019 1306   AST 18 10/21/2019 1306   ALT 14 10/21/2019 1306   BILITOT 0.6 10/21/2019 1306      Impression and Plan: Clarence Hopkins is a very nice 86 year old white male.  He still has a decent performance status.  Hopefully, the radioactive iodine will continue to help.  We will find out when we do another CT scan when I see him back in November.  Overall, his quality life is doing quite nicely and that is what is important.       [name], MD 9/7/20212:20 PM

## 2019-10-22 LAB — LACTATE DEHYDROGENASE: LDH: 135 U/L (ref 98–192)

## 2019-10-22 NOTE — Progress Notes (Signed)
Oncology Nurse Navigator Documentation  Oncology Nurse Navigator Flowsheets 10/21/2019  Navigator Follow Up Date: 12/22/2019  Navigator Follow Up Reason: Follow-up Appointment  Navigator Location [facility]  Referral Date to RadOnc/MedOnc -  Navigator Encounter Type Appt/Treatment Plan Review  Telephone -  Patient Visit Type MedOnc  Treatment Phase Active Tx  Barriers/Navigation Needs No Barriers At This Time  Education -  Interventions None Required  Acuity Level 1-No Barriers  Referrals -  Coordination of Care -  Education Method -  Support Groups/Services -  Time Spent with Patient 15

## 2019-10-23 DIAGNOSIS — Z23 Encounter for immunization: Secondary | ICD-10-CM | POA: Diagnosis not present

## 2019-10-28 ENCOUNTER — Ambulatory Visit: Payer: Medicare Other | Admitting: General Practice

## 2019-11-03 ENCOUNTER — Other Ambulatory Visit: Payer: Self-pay | Admitting: Internal Medicine

## 2019-11-03 DIAGNOSIS — Z7901 Long term (current) use of anticoagulants: Secondary | ICD-10-CM

## 2019-11-06 DIAGNOSIS — Z85828 Personal history of other malignant neoplasm of skin: Secondary | ICD-10-CM | POA: Diagnosis not present

## 2019-11-06 DIAGNOSIS — L821 Other seborrheic keratosis: Secondary | ICD-10-CM | POA: Diagnosis not present

## 2019-11-06 DIAGNOSIS — L570 Actinic keratosis: Secondary | ICD-10-CM | POA: Diagnosis not present

## 2019-11-06 DIAGNOSIS — C44719 Basal cell carcinoma of skin of left lower limb, including hip: Secondary | ICD-10-CM | POA: Diagnosis not present

## 2019-11-06 DIAGNOSIS — C44712 Basal cell carcinoma of skin of right lower limb, including hip: Secondary | ICD-10-CM | POA: Diagnosis not present

## 2019-11-06 DIAGNOSIS — Z85820 Personal history of malignant melanoma of skin: Secondary | ICD-10-CM | POA: Diagnosis not present

## 2019-11-06 DIAGNOSIS — D1801 Hemangioma of skin and subcutaneous tissue: Secondary | ICD-10-CM | POA: Diagnosis not present

## 2019-11-20 ENCOUNTER — Ambulatory Visit: Payer: Medicare Other

## 2019-12-02 ENCOUNTER — Ambulatory Visit (INDEPENDENT_AMBULATORY_CARE_PROVIDER_SITE_OTHER): Payer: Medicare Other | Admitting: General Practice

## 2019-12-02 ENCOUNTER — Other Ambulatory Visit: Payer: Self-pay

## 2019-12-02 DIAGNOSIS — Z7901 Long term (current) use of anticoagulants: Secondary | ICD-10-CM

## 2019-12-02 DIAGNOSIS — Z86718 Personal history of other venous thrombosis and embolism: Secondary | ICD-10-CM

## 2019-12-02 LAB — POCT INR: INR: 2.3 (ref 2.0–3.0)

## 2019-12-02 NOTE — Patient Instructions (Addendum)
Pre visit review using our clinic review tool, if applicable. No additional management support is needed unless otherwise documented below in the visit note.  Continue to take 1 (5 mg) tablet daily.  Re-check in 6 weeks.

## 2019-12-02 NOTE — Progress Notes (Signed)
Medical screening examination/treatment/procedure(s) were performed by non-physician practitioner and as supervising physician I was immediately available for consultation/collaboration. I agree with above. Grant Swager, MD   

## 2019-12-17 DIAGNOSIS — C799 Secondary malignant neoplasm of unspecified site: Secondary | ICD-10-CM | POA: Diagnosis not present

## 2019-12-17 DIAGNOSIS — C73 Malignant neoplasm of thyroid gland: Secondary | ICD-10-CM | POA: Diagnosis not present

## 2019-12-17 DIAGNOSIS — E890 Postprocedural hypothyroidism: Secondary | ICD-10-CM | POA: Diagnosis not present

## 2019-12-22 ENCOUNTER — Inpatient Hospital Stay (HOSPITAL_BASED_OUTPATIENT_CLINIC_OR_DEPARTMENT_OTHER): Payer: Medicare Other | Admitting: Hematology & Oncology

## 2019-12-22 ENCOUNTER — Other Ambulatory Visit: Payer: Self-pay

## 2019-12-22 ENCOUNTER — Inpatient Hospital Stay: Payer: Medicare Other | Attending: Hematology & Oncology

## 2019-12-22 ENCOUNTER — Telehealth: Payer: Self-pay | Admitting: Hematology & Oncology

## 2019-12-22 ENCOUNTER — Ambulatory Visit (HOSPITAL_BASED_OUTPATIENT_CLINIC_OR_DEPARTMENT_OTHER)
Admission: RE | Admit: 2019-12-22 | Discharge: 2019-12-22 | Disposition: A | Discharge status: Home / Self Care | Payer: Medicare Other | Source: Ambulatory Visit | Attending: Hematology & Oncology | Admitting: Hematology & Oncology

## 2019-12-22 VITALS — BP 163/88 | HR 58 | Temp 97.5°F | Resp 18 | Wt 205.0 lb

## 2019-12-22 DIAGNOSIS — C73 Malignant neoplasm of thyroid gland: Secondary | ICD-10-CM | POA: Insufficient documentation

## 2019-12-22 DIAGNOSIS — C7800 Secondary malignant neoplasm of unspecified lung: Secondary | ICD-10-CM | POA: Diagnosis not present

## 2019-12-22 DIAGNOSIS — Z85850 Personal history of malignant neoplasm of thyroid: Secondary | ICD-10-CM | POA: Diagnosis not present

## 2019-12-22 DIAGNOSIS — Z7901 Long term (current) use of anticoagulants: Secondary | ICD-10-CM | POA: Insufficient documentation

## 2019-12-22 DIAGNOSIS — I2510 Atherosclerotic heart disease of native coronary artery without angina pectoris: Secondary | ICD-10-CM | POA: Diagnosis not present

## 2019-12-22 DIAGNOSIS — R918 Other nonspecific abnormal finding of lung field: Secondary | ICD-10-CM | POA: Diagnosis not present

## 2019-12-22 DIAGNOSIS — I712 Thoracic aortic aneurysm, without rupture: Secondary | ICD-10-CM | POA: Diagnosis not present

## 2019-12-22 LAB — CBC WITH DIFFERENTIAL (CANCER CENTER ONLY)
Abs Immature Granulocytes: 0.02 10*3/uL (ref 0.00–0.07)
Basophils Absolute: 0 10*3/uL (ref 0.0–0.1)
Basophils Relative: 1 %
Eosinophils Absolute: 0.1 10*3/uL (ref 0.0–0.5)
Eosinophils Relative: 2 %
HCT: 42.1 % (ref 39.0–52.0)
Hemoglobin: 13.3 g/dL (ref 13.0–17.0)
Immature Granulocytes: 0 %
Lymphocytes Relative: 31 %
Lymphs Abs: 1.5 10*3/uL (ref 0.7–4.0)
MCH: 28.4 pg (ref 26.0–34.0)
MCHC: 31.6 g/dL (ref 30.0–36.0)
MCV: 90 fL (ref 80.0–100.0)
Monocytes Absolute: 0.5 10*3/uL (ref 0.1–1.0)
Monocytes Relative: 12 %
Neutro Abs: 2.5 10*3/uL (ref 1.7–7.7)
Neutrophils Relative %: 54 %
Platelet Count: 152 10*3/uL (ref 150–400)
RBC: 4.68 MIL/uL (ref 4.22–5.81)
RDW: 13.3 % (ref 11.5–15.5)
WBC Count: 4.7 10*3/uL (ref 4.0–10.5)
nRBC: 0 % (ref 0.0–0.2)

## 2019-12-22 LAB — CMP (CANCER CENTER ONLY)
ALT: 13 U/L (ref 0–44)
AST: 19 U/L (ref 15–41)
Albumin: 3.9 g/dL (ref 3.5–5.0)
Alkaline Phosphatase: 64 U/L (ref 38–126)
Anion gap: 5 (ref 5–15)
BUN: 21 mg/dL (ref 8–23)
CO2: 30 mmol/L (ref 22–32)
Calcium: 9.6 mg/dL (ref 8.9–10.3)
Chloride: 104 mmol/L (ref 98–111)
Creatinine: 1.16 mg/dL (ref 0.61–1.24)
GFR, Estimated: >60 mL/min (ref >60)
Glucose, Bld: 79 mg/dL (ref 70–99)
Potassium: 5 mmol/L (ref 3.5–5.1)
Sodium: 139 mmol/L (ref 135–145)
Total Bilirubin: 0.6 mg/dL (ref 0.3–1.2)
Total Protein: 6.8 g/dL (ref 6.5–8.1)

## 2019-12-22 LAB — LACTATE DEHYDROGENASE: LDH: 132 U/L (ref 98–192)

## 2019-12-22 MED ORDER — IOHEXOL 300 MG/ML  SOLN
100.0000 mL | Freq: Once | INTRAMUSCULAR | Status: AC | PRN
  Administered 2019-12-22: 80 mL via INTRAVENOUS

## 2019-12-22 NOTE — Telephone Encounter (Signed)
Appointments scheduled calendar printed & mailed per 11/8 los 

## 2019-12-22 NOTE — Progress Notes (Signed)
Hematology and Oncology Follow Up Visit  Clarence Hopkins [id number] 1930-12-18 86 y.o. 12/22/2019   Principle Diagnosis:   Metastatic papillary carcinoma of the thyroid  Current Therapy:    S/p I-131 therapy on 08/27/2019     Interim History:  Clarence Hopkins is back for follow-up.  Clarence Hopkins really looks good.  I must say this is probably best I have seen him look since we first started seeing him.  He had a CT scan of the chest done today.  This right lower lobe infrahilar mass continues to shrink in size.  And now measures 3.2 x 3.8 cm.  Previously, it measured 4.6 x 5.4 cm.  There are pulmonary nodules that are bilateral.  These are stable in size.  The largest is 2.4 x 2.6 cm in the medial left lower lobe.  He does have some lesions in the liver but I suspect these are not malignant lesions.  He is now 4 months out from the radioactive iodine.  He has responded nicely to the radioactive iodine.  There is no cough.  He does get little bit short of breath with exertion.  I think this is from underlying lung disease.  He has had no bleeding.  He is on Coumadin.  There is no change in bowel or bladder habits.  Overall, I would say his performance status is ECOG 1.  Medications:  Current Outpatient Medications:  .  warfarin (COUMADIN) 5 MG tablet, Take 5 mg by mouth daily., Disp: , Rfl:  .  b complex vitamins tablet, Take 1 tablet by mouth daily., Disp: 100 tablet, Rfl: 3 .  diphenhydramine-acetaminophen (TYLENOL PM) 25-500 MG TABS tablet, Take 1 tablet by mouth at bedtime., Disp: , Rfl:  .  Glycerin-Hypromellose-PEG 400 (DRY EYE RELIEF DROPS) 0.2-0.2-1 % SOLN, Place 1 drop into both eyes daily as needed (Dry eye)., Disp: , Rfl:  .  imiquimod (ALDARA) 5 % cream, SMARTSIG:1 Packet(s) Topical Every Night, Disp: , Rfl:  .  levothyroxine (SYNTHROID) 175 MCG tablet, Take 175 mcg by mouth daily before breakfast., Disp: , Rfl:  .  tamsulosin (FLOMAX) 0.4 MG CAPS capsule, TAKE 1 CAPSULE BY  MOUTH DAILY AFTER SUPPER (Patient taking differently: Take 0.4 mg by mouth at bedtime. ), Disp: 90 capsule, Rfl: 3  Allergies: No Known Allergies  Past Medical History, Surgical history, Social history, and Family History were reviewed and updated.  Review of Systems: Review of Systems  Constitutional: Negative.   HENT:  Negative.   Eyes: Negative.   Respiratory: Negative.   Cardiovascular: Negative.   Gastrointestinal: Negative.   Endocrine: Negative.   Genitourinary: Negative.    Musculoskeletal: Negative.   Skin: Negative.   Neurological: Negative.   Hematological: Negative.   Psychiatric/Behavioral: Negative.     Physical Exam:  weight is 205 lb (93 kg). His oral temperature is 97.5 F (36.4 C) (abnormal). His blood pressure is 163/88 (abnormal) and his pulse is 58 (abnormal). His respiration is 18 and oxygen saturation is 98%.   Wt Readings from Last 3 Encounters:  12/22/19 205 lb (93 kg)  10/21/19 200 lb (90.7 kg)  09/10/19 194 lb 5 oz (88.1 kg)    Physical Exam Vitals reviewed.  HENT:     Head: Normocephalic and atraumatic.  Eyes:     Pupils: Pupils are equal, round, and reactive to light.  Cardiovascular:     Rate and Rhythm: Normal rate and regular rhythm.     Heart sounds: Normal heart sounds.  Pulmonary:  Effort: Pulmonary effort is normal.     Breath sounds: Normal breath sounds.  Abdominal:     General: Bowel sounds are normal.     Palpations: Abdomen is soft.  Musculoskeletal:        General: No tenderness or deformity. Normal range of motion.     Cervical back: Normal range of motion.  Lymphadenopathy:     Cervical: No cervical adenopathy.  Skin:    General: Skin is warm and dry.     Findings: No erythema or rash.  Neurological:     Mental Status: He is alert and oriented to person, place, and time.  Psychiatric:        Behavior: Behavior normal.        Thought Content: Thought content normal.        Judgment: Judgment normal.       Lab Results  Component Value Date   WBC 4.7 12/22/2019   HGB 13.3 12/22/2019   HCT 42.1 12/22/2019   MCV 90.0 12/22/2019   PLT 152 12/22/2019     Chemistry      Component Value Date/Time   NA 139 12/22/2019 1042   K 5.0 12/22/2019 1042   CL 104 12/22/2019 1042   CO2 30 12/22/2019 1042   BUN 21 12/22/2019 1042   CREATININE 1.16 12/22/2019 1042      Component Value Date/Time   CALCIUM 9.6 12/22/2019 1042   ALKPHOS 64 12/22/2019 1042   AST 19 12/22/2019 1042   ALT 13 12/22/2019 1042   BILITOT 0.6 12/22/2019 1042      Impression and Plan: Clarence Hopkins is a very nice 86 year old white male.  He has metastatic thyroid cancer.  This is differentiated thyroid cancer.  Thankfully it is responsive to the radioactive iodine.  He is still responding.  For right now, we will plan for another CT scan on him in 3 months.  I think this would be very reasonable.  I am just happy that his quality of life and his overall performance status is doing so well right now.     [name], MD 11/8/202112:48 PM

## 2019-12-23 ENCOUNTER — Encounter: Payer: Self-pay | Admitting: *Deleted

## 2019-12-23 NOTE — Progress Notes (Signed)
Reviewed patient's MD appointment. He will need a CT scan with his next MD apt. Scheduled for 03/26/19.   Called and notified patient of appointment, including date, time and location.   Oncology Nurse Navigator Documentation  Oncology Nurse Navigator Flowsheets 12/23/2019  Navigator Follow Up Date: -  Navigator Follow Up Reason: -  Navigation Complete Date: 12/23/2019  Post Navigation: Continue to Follow Patient? No  Reason Not Navigating Patient: No Treatment, Observation Only  Navigator Location [facility]  Referral Date to RadOnc/MedOnc -  Navigator Encounter Type Appt/Treatment Plan Review;Telephone  Telephone Outgoing Call;Appt Confirmation/Clarification  Patient Visit Type MedOnc  Treatment Phase Post-Tx Follow-up  Barriers/Navigation Needs Coordination of Care;Education  Education Other  Interventions Coordination of Care;Education  Acuity Level 2-Minimal Needs (1-2 Barriers Identified)  Referrals -  Coordination of Care Appts;Radiology  Education Method Verbal  Support Groups/Services Friends and Family  Time Spent with Patient 30

## 2019-12-26 DIAGNOSIS — Z23 Encounter for immunization: Secondary | ICD-10-CM | POA: Diagnosis not present

## 2019-12-27 LAB — THYROGLOBULIN LEVEL: Thyroglobulin: 14 ng/mL

## 2019-12-29 DIAGNOSIS — D485 Neoplasm of uncertain behavior of skin: Secondary | ICD-10-CM | POA: Diagnosis not present

## 2019-12-29 DIAGNOSIS — C4441 Basal cell carcinoma of skin of scalp and neck: Secondary | ICD-10-CM | POA: Diagnosis not present

## 2019-12-29 DIAGNOSIS — C44719 Basal cell carcinoma of skin of left lower limb, including hip: Secondary | ICD-10-CM | POA: Diagnosis not present

## 2019-12-29 DIAGNOSIS — L570 Actinic keratosis: Secondary | ICD-10-CM | POA: Diagnosis not present

## 2019-12-29 DIAGNOSIS — Z85820 Personal history of malignant melanoma of skin: Secondary | ICD-10-CM | POA: Diagnosis not present

## 2019-12-29 DIAGNOSIS — Z85828 Personal history of other malignant neoplasm of skin: Secondary | ICD-10-CM | POA: Diagnosis not present

## 2020-01-13 ENCOUNTER — Other Ambulatory Visit: Payer: Self-pay

## 2020-01-13 ENCOUNTER — Ambulatory Visit (INDEPENDENT_AMBULATORY_CARE_PROVIDER_SITE_OTHER): Payer: Medicare Other | Admitting: General Practice

## 2020-01-13 DIAGNOSIS — Z86718 Personal history of other venous thrombosis and embolism: Secondary | ICD-10-CM | POA: Diagnosis not present

## 2020-01-13 DIAGNOSIS — Z7901 Long term (current) use of anticoagulants: Secondary | ICD-10-CM

## 2020-01-13 LAB — POCT INR: INR: 1.5 — AB (ref 2.0–3.0)

## 2020-01-13 NOTE — Patient Instructions (Addendum)
Pre visit review using our clinic review tool, if applicable. No additional management support is needed unless otherwise documented below in the visit note.  Take 1 1/2 tablets today and tomorrow and then continue to take 1 (5 mg) tablet daily.  Re-check in 2 weeks.

## 2020-01-27 ENCOUNTER — Other Ambulatory Visit: Payer: Self-pay

## 2020-01-27 ENCOUNTER — Encounter (INDEPENDENT_AMBULATORY_CARE_PROVIDER_SITE_OTHER): Payer: Self-pay

## 2020-01-27 ENCOUNTER — Ambulatory Visit (INDEPENDENT_AMBULATORY_CARE_PROVIDER_SITE_OTHER): Payer: Medicare Other | Admitting: General Practice

## 2020-01-27 DIAGNOSIS — Z86718 Personal history of other venous thrombosis and embolism: Secondary | ICD-10-CM

## 2020-01-27 DIAGNOSIS — Z7901 Long term (current) use of anticoagulants: Secondary | ICD-10-CM

## 2020-01-27 LAB — POCT INR: INR: 1.9 — AB (ref 2.0–3.0)

## 2020-01-27 NOTE — Patient Instructions (Addendum)
Pre visit review using our clinic review tool, if applicable. No additional management support is needed unless otherwise documented below in the visit note.  Take 1 1/2 tablets today and then change dosage and take 1 (5 mg) tablet daily except take 1 1/2 tablets every Monday.  Re-check in 4 weeks.

## 2020-02-24 ENCOUNTER — Other Ambulatory Visit: Payer: Self-pay

## 2020-02-24 ENCOUNTER — Ambulatory Visit (INDEPENDENT_AMBULATORY_CARE_PROVIDER_SITE_OTHER): Payer: Medicare Other | Admitting: General Practice

## 2020-02-24 DIAGNOSIS — Z7901 Long term (current) use of anticoagulants: Secondary | ICD-10-CM | POA: Diagnosis not present

## 2020-02-24 DIAGNOSIS — Z86718 Personal history of other venous thrombosis and embolism: Secondary | ICD-10-CM | POA: Diagnosis not present

## 2020-02-24 LAB — POCT INR: INR: 2.2 (ref 2.0–3.0)

## 2020-02-24 NOTE — Patient Instructions (Addendum)
Pre visit review using our clinic review tool, if applicable. No additional management support is needed unless otherwise documented below in the visit note.  Continue to take 1 (5 mg) tablet daily except take 1 1/2 tablets every Monday.  Re-check in 4 weeks.

## 2020-02-24 NOTE — Progress Notes (Signed)
Medical screening examination/treatment/procedure(s) were performed by non-physician practitioner and as supervising physician I was immediately available for consultation/collaboration. I agree with above. Jessice Madill, MD   

## 2020-03-08 ENCOUNTER — Other Ambulatory Visit: Payer: Self-pay | Admitting: Internal Medicine

## 2020-03-08 DIAGNOSIS — Z7901 Long term (current) use of anticoagulants: Secondary | ICD-10-CM

## 2020-03-09 DIAGNOSIS — L821 Other seborrheic keratosis: Secondary | ICD-10-CM | POA: Diagnosis not present

## 2020-03-09 DIAGNOSIS — D1801 Hemangioma of skin and subcutaneous tissue: Secondary | ICD-10-CM | POA: Diagnosis not present

## 2020-03-09 DIAGNOSIS — C44719 Basal cell carcinoma of skin of left lower limb, including hip: Secondary | ICD-10-CM | POA: Diagnosis not present

## 2020-03-09 DIAGNOSIS — L814 Other melanin hyperpigmentation: Secondary | ICD-10-CM | POA: Diagnosis not present

## 2020-03-09 DIAGNOSIS — L820 Inflamed seborrheic keratosis: Secondary | ICD-10-CM | POA: Diagnosis not present

## 2020-03-09 DIAGNOSIS — L570 Actinic keratosis: Secondary | ICD-10-CM | POA: Diagnosis not present

## 2020-03-09 DIAGNOSIS — Z85820 Personal history of malignant melanoma of skin: Secondary | ICD-10-CM | POA: Diagnosis not present

## 2020-03-09 DIAGNOSIS — D485 Neoplasm of uncertain behavior of skin: Secondary | ICD-10-CM | POA: Diagnosis not present

## 2020-03-09 DIAGNOSIS — Z85828 Personal history of other malignant neoplasm of skin: Secondary | ICD-10-CM | POA: Diagnosis not present

## 2020-03-10 ENCOUNTER — Encounter: Payer: Self-pay | Admitting: Internal Medicine

## 2020-03-10 ENCOUNTER — Other Ambulatory Visit: Payer: Self-pay

## 2020-03-10 ENCOUNTER — Ambulatory Visit (INDEPENDENT_AMBULATORY_CARE_PROVIDER_SITE_OTHER): Payer: Medicare Other | Admitting: Internal Medicine

## 2020-03-10 DIAGNOSIS — C73 Malignant neoplasm of thyroid gland: Secondary | ICD-10-CM | POA: Diagnosis not present

## 2020-03-10 DIAGNOSIS — D6859 Other primary thrombophilia: Secondary | ICD-10-CM | POA: Diagnosis not present

## 2020-03-10 DIAGNOSIS — C439 Malignant melanoma of skin, unspecified: Secondary | ICD-10-CM

## 2020-03-10 DIAGNOSIS — R0600 Dyspnea, unspecified: Secondary | ICD-10-CM

## 2020-03-10 DIAGNOSIS — R0609 Other forms of dyspnea: Secondary | ICD-10-CM

## 2020-03-10 DIAGNOSIS — Z86718 Personal history of other venous thrombosis and embolism: Secondary | ICD-10-CM | POA: Diagnosis not present

## 2020-03-10 NOTE — Assessment & Plan Note (Signed)
On Coumadin 

## 2020-03-10 NOTE — Patient Instructions (Signed)
For a mild COVID-19 case you can take zinc 50 mg a day for 1 week, vitamin C 1000 mg daily for 1 week, vitamin D2 50,000 units weekly for 2 months (unless you are taking vitamin D daily already), Quercetin 500 mg twice a day for 1 week (if you can get it quick enough).  Maintain good oral hydration and take Tylenol with high fever.

## 2020-03-10 NOTE — Assessment & Plan Note (Addendum)
Better after radioactive iodine Rx Exercise daily

## 2020-03-10 NOTE — Assessment & Plan Note (Signed)
F/u w/Dr [name]

## 2020-03-10 NOTE — Assessment & Plan Note (Addendum)
Metastatic thyroid cancer - a differentiated thyroid cancer  responsive to the radioactive iodine CT, labs w/TSH pending in 2 wks

## 2020-03-10 NOTE — Progress Notes (Signed)
Subjective:  Patient ID: Clarence Hopkins, male    DOB: 29-Feb-1930  Age: 87 y.o. MRN: [id number]  CC: Follow-up (6 month f/u)   HPI Clarence Hopkins presents for a thyroid cancer, s/p radioactive iodine rx, anticoagulation and DOE f/u. DOE is better  Outpatient Medications Prior to Visit  Medication Sig Dispense Refill  . b complex vitamins tablet Take 1 tablet by mouth daily. 100 tablet 3  . diphenhydramine-acetaminophen (TYLENOL PM) 25-500 MG TABS tablet Take 1 tablet by mouth at bedtime.    . Glycerin-Hypromellose-PEG 400 (DRY EYE RELIEF DROPS) 0.2-0.2-1 % SOLN Place 1 drop into both eyes daily as needed (Dry eye).    . imiquimod (ALDARA) 5 % cream SMARTSIG:1 Packet(s) Topical Every Night    . levothyroxine (SYNTHROID) 175 MCG tablet Take 175 mcg by mouth daily before breakfast.    . tamsulosin (FLOMAX) 0.4 MG CAPS capsule TAKE 1 CAPSULE BY MOUTH DAILY AFTER SUPPER (Patient taking differently: Take 0.4 mg by mouth at bedtime.) 90 capsule 3  . warfarin (COUMADIN) 5 MG tablet TAKE 1 TABLET DAILY OR AS DIRECTED BY ANTICOAGULATION CLINIC 90 tablet 1   No facility-administered medications prior to visit.    ROS: Review of Systems  Constitutional: Positive for fatigue. Negative for appetite change and unexpected weight change.  HENT: Negative for congestion, nosebleeds, sneezing, sore throat and trouble swallowing.   Eyes: Negative for itching and visual disturbance.  Respiratory: Negative for cough.   Cardiovascular: Negative for chest pain, palpitations and leg swelling.  Gastrointestinal: Negative for abdominal distention, blood in stool, diarrhea and nausea.  Genitourinary: Negative for frequency and hematuria.  Musculoskeletal: Negative for back pain, gait problem, joint swelling and neck pain.  Skin: Negative for rash.  Neurological: Negative for dizziness, tremors, speech difficulty and weakness.  Psychiatric/Behavioral: Negative for agitation, dysphoric mood and sleep disturbance. The  patient is not nervous/anxious.     Objective:  BP (!) 152/90 (BP Location: Left Arm)   Pulse 76   Temp 98 F (36.7 C) (Oral)   Ht 6\' 4"  (1.93 m)   Wt 203 lb (92.1 kg)   SpO2 95%   BMI 24.71 kg/m   BP Readings from Last 3 Encounters:  03/10/20 (!) 152/90  12/22/19 (!) 163/88  10/21/19 133/71    Wt Readings from Last 3 Encounters:  03/10/20 203 lb (92.1 kg)  12/22/19 205 lb (93 kg)  10/21/19 200 lb (90.7 kg)    Physical Exam Constitutional:      General: He is not in acute distress.    Appearance: He is well-developed.     Comments: NAD  HENT:     Mouth/Throat:     Mouth: Oropharynx is clear and moist.  Eyes:     Conjunctiva/sclera: Conjunctivae normal.     Pupils: Pupils are equal, round, and reactive to light.  Neck:     Thyroid: No thyromegaly.     Vascular: No JVD.  Cardiovascular:     Rate and Rhythm: Normal rate and regular rhythm.     Pulses: Intact distal pulses.     Heart sounds: Normal heart sounds. No murmur heard. No friction rub. No gallop.   Pulmonary:     Effort: Pulmonary effort is normal. No respiratory distress.     Breath sounds: Normal breath sounds. No wheezing or rales.  Chest:     Chest wall: No tenderness.  Abdominal:     General: Bowel sounds are normal. There is no distension.  Palpations: Abdomen is soft. There is no mass.     Tenderness: There is no abdominal tenderness. There is no guarding or rebound.  Musculoskeletal:        General: No tenderness or edema. Normal range of motion.     Cervical back: Normal range of motion.  Lymphadenopathy:     Cervical: No cervical adenopathy.  Skin:    General: Skin is warm and dry.     Findings: No rash.  Neurological:     Mental Status: He is alert and oriented to person, place, and time.     Cranial Nerves: No cranial nerve deficit.     Motor: No abnormal muscle tone.     Coordination: He displays a negative Romberg sign. Coordination abnormal.     Gait: Gait normal.     Deep  Tendon Reflexes: Reflexes are normal and symmetric.  Psychiatric:        Mood and Affect: Mood and affect normal.        Behavior: Behavior normal.        Thought Content: Thought content normal.        Judgment: Judgment normal.     Lab Results  Component Value Date   WBC 4.7 12/22/2019   HGB 13.3 12/22/2019   HCT 42.1 12/22/2019   PLT 152 12/22/2019   GLUCOSE 79 12/22/2019   CHOL 141 03/14/2019   TRIG 74.0 03/14/2019   HDL 38.00 (L) 03/14/2019   LDLDIRECT 116.0 03/31/2013   LDLCALC 88 03/14/2019   ALT 13 12/22/2019   AST 19 12/22/2019   NA 139 12/22/2019   K 5.0 12/22/2019   CL 104 12/22/2019   CREATININE 1.16 12/22/2019   BUN 21 12/22/2019   CO2 30 12/22/2019   TSH 0.67 06/03/2019   PSA 9.86 (H) 03/14/2019   INR 2.2 02/24/2020    CT Chest W Contrast  Result Date: 12/22/2019 CLINICAL DATA:  Head neck cancer, assess treatment response for metastatic thyroid cancer. History of a lung mass. EXAM: CT CHEST WITH CONTRAST TECHNIQUE: Multidetector CT imaging of the chest was performed during intravenous contrast administration. CONTRAST:  51mL OMNIPAQUE IOHEXOL 300 MG/ML  SOLN COMPARISON:  09/29/2019. FINDINGS: Cardiovascular: Atherosclerotic calcification of the aorta and coronary arteries. Ascending aorta measures 4.2 cm. Right and left pulmonary arteries are enlarged. Heart is at the upper limits of normal in size. No pericardial effusion. Mediastinum/Nodes: Low internal jugular lymph nodes are not enlarged by CT size criteria. No pathologically enlarged mediastinal, left hilar or axillary lymph nodes. Right hilar lymphoid tissue. Esophagus is grossly unremarkable. Lungs/Pleura: Basilar predominant pulmonary nodules and masses are largely unchanged. The exception is a dominant 3.2 x 3.8 cm mass in the infrahilar right lower lobe (3/107) which has decreased in size from 4.6 x 5.4 cm on 09/29/2019. Additional extensive hematogenously distributed and basilar predominant pulmonary  nodules measure up to 2.4 x 2.6 cm in the medial left lower lobe (3/122), stable from 09/29/2019. No pleural fluid. Airway is unremarkable. Upper Abdomen: Low-attenuation lesions in both lobes of the liver measure up to 2.9 cm on the right, unchanged. Visualized portions of the gallbladder and adrenal glands are unremarkable. Low-attenuation masses in the kidneys measure up to 11.7 cm on the right, incompletely imaged. Stones are seen in the right kidney. Spleen and visualized portions of the pancreas, stomach and bowel are grossly unremarkable. No upper abdominal adenopathy. Musculoskeletal: Degenerative changes in the spine. No worrisome lytic or sclerotic lesions. Probable subcutaneous sebaceous cyst overlying the right paraspinal  musculature of the lower thoracic spine. IMPRESSION: 1. Pulmonary metastatic disease is largely stable with exception of a dominant mass in the infrahilar right lower lobe, which is decreased slightly in size in the interval 2. Ascending Aortic aneurysm NOS (ICD10-I71.9). Recommend annual imaging followup by CTA or MRA. This recommendation follows 2010 ACCF/AHA/AATS/ACR/ASA/SCA/SCAI/SIR/STS/SVM Guidelines for the Diagnosis and Management of Patients with Thoracic Aortic Disease. Circulation. 2010; 121: Y185-U314. Aortic aneurysm NOS (ICD10-I71.9). 3. Right renal stones. 4. Hyperattenuating inferior right hepatic lobe mass, seen on 09/29/2019, was not imaged today. 5. Aortic atherosclerosis (ICD10-I70.0). Coronary artery calcification. 6. Enlarged pulmonary arteries, indicative of pulmonary arterial hypertension. Electronically Signed   By: Lorin Picket M.D.   On: 12/22/2019 12:17    Assessment & Plan:   There are no diagnoses linked to this encounter.   No orders of the defined types were placed in this encounter.    Follow-up: No follow-ups on file.  Walker Kehr, MD

## 2020-03-25 ENCOUNTER — Other Ambulatory Visit: Payer: Self-pay

## 2020-03-25 ENCOUNTER — Inpatient Hospital Stay: Payer: Medicare Other | Attending: Hematology & Oncology

## 2020-03-25 ENCOUNTER — Inpatient Hospital Stay (HOSPITAL_BASED_OUTPATIENT_CLINIC_OR_DEPARTMENT_OTHER): Payer: Medicare Other | Admitting: Hematology & Oncology

## 2020-03-25 ENCOUNTER — Ambulatory Visit (HOSPITAL_BASED_OUTPATIENT_CLINIC_OR_DEPARTMENT_OTHER)
Admission: RE | Admit: 2020-03-25 | Discharge: 2020-03-25 | Disposition: A | Discharge status: Home / Self Care | Payer: Medicare Other | Source: Ambulatory Visit | Attending: Hematology & Oncology | Admitting: Hematology & Oncology

## 2020-03-25 ENCOUNTER — Encounter (HOSPITAL_BASED_OUTPATIENT_CLINIC_OR_DEPARTMENT_OTHER): Payer: Self-pay

## 2020-03-25 ENCOUNTER — Encounter: Payer: Self-pay | Admitting: Hematology & Oncology

## 2020-03-25 VITALS — BP 149/86 | HR 58 | Temp 97.4°F | Resp 20 | Wt 206.0 lb

## 2020-03-25 DIAGNOSIS — R918 Other nonspecific abnormal finding of lung field: Secondary | ICD-10-CM | POA: Insufficient documentation

## 2020-03-25 DIAGNOSIS — I712 Thoracic aortic aneurysm, without rupture: Secondary | ICD-10-CM | POA: Insufficient documentation

## 2020-03-25 DIAGNOSIS — C73 Malignant neoplasm of thyroid gland: Secondary | ICD-10-CM

## 2020-03-25 DIAGNOSIS — R42 Dizziness and giddiness: Secondary | ICD-10-CM | POA: Insufficient documentation

## 2020-03-25 DIAGNOSIS — Z85850 Personal history of malignant neoplasm of thyroid: Secondary | ICD-10-CM | POA: Diagnosis not present

## 2020-03-25 DIAGNOSIS — I700 Atherosclerosis of aorta: Secondary | ICD-10-CM | POA: Diagnosis not present

## 2020-03-25 DIAGNOSIS — I2510 Atherosclerotic heart disease of native coronary artery without angina pectoris: Secondary | ICD-10-CM | POA: Diagnosis not present

## 2020-03-25 LAB — CBC WITH DIFFERENTIAL (CANCER CENTER ONLY)
Abs Immature Granulocytes: 0.02 10*3/uL (ref 0.00–0.07)
Basophils Absolute: 0 10*3/uL (ref 0.0–0.1)
Basophils Relative: 0 %
Eosinophils Absolute: 0.2 10*3/uL (ref 0.0–0.5)
Eosinophils Relative: 3 %
HCT: 42.3 % (ref 39.0–52.0)
Hemoglobin: 13.3 g/dL (ref 13.0–17.0)
Immature Granulocytes: 0 %
Lymphocytes Relative: 23 %
Lymphs Abs: 1.4 10*3/uL (ref 0.7–4.0)
MCH: 28.1 pg (ref 26.0–34.0)
MCHC: 31.4 g/dL (ref 30.0–36.0)
MCV: 89.2 fL (ref 80.0–100.0)
Monocytes Absolute: 0.6 10*3/uL (ref 0.1–1.0)
Monocytes Relative: 10 %
Neutro Abs: 3.9 10*3/uL (ref 1.7–7.7)
Neutrophils Relative %: 64 %
Platelet Count: 176 10*3/uL (ref 150–400)
RBC: 4.74 MIL/uL (ref 4.22–5.81)
RDW: 13 % (ref 11.5–15.5)
WBC Count: 6.1 10*3/uL (ref 4.0–10.5)
nRBC: 0 % (ref 0.0–0.2)

## 2020-03-25 LAB — CMP (CANCER CENTER ONLY)
ALT: 18 U/L (ref 0–44)
AST: 24 U/L (ref 15–41)
Albumin: 4.1 g/dL (ref 3.5–5.0)
Alkaline Phosphatase: 70 U/L (ref 38–126)
Anion gap: 3 — ABNORMAL LOW (ref 5–15)
BUN: 19 mg/dL (ref 8–23)
CO2: 33 mmol/L — ABNORMAL HIGH (ref 22–32)
Calcium: 9.6 mg/dL (ref 8.9–10.3)
Chloride: 102 mmol/L (ref 98–111)
Creatinine: 1.27 mg/dL — ABNORMAL HIGH (ref 0.61–1.24)
GFR, Estimated: 55 mL/min — ABNORMAL LOW (ref >60)
Glucose, Bld: 83 mg/dL (ref 70–99)
Potassium: 4.8 mmol/L (ref 3.5–5.1)
Sodium: 138 mmol/L (ref 135–145)
Total Bilirubin: 0.6 mg/dL (ref 0.3–1.2)
Total Protein: 6.9 g/dL (ref 6.5–8.1)

## 2020-03-25 MED ORDER — IOHEXOL 300 MG/ML  SOLN
100.0000 mL | Freq: Once | INTRAMUSCULAR | Status: AC | PRN
  Administered 2020-03-25: 75 mL via INTRAVENOUS

## 2020-03-25 NOTE — Progress Notes (Signed)
Hematology and Oncology Follow Up Visit  Clarence Hopkins [id number] May 31, 1930 87 y.o. 03/25/2020   Principle Diagnosis:   Metastatic papillary carcinoma of the thyroid  Current Therapy:    S/p I-131 therapy on 08/27/2019     Interim History:  Clarence Hopkins is back for follow-up.  Clarence Hopkins really looks good.  I must say this is probably best I have seen him look since we first started seeing him.  He had a CT scan of the chest done today.  This right lower lobe infrahilar mass continues to shrink in size.  And now measures 3.4 x 2.9cm.  Previously, it measured 3.7 x 3.1 cm.  There are pulmonary nodules that are bilateral.  These are stable in size.  The largest is 2.8 x 2.6 cm in the medial left lower lobe.  This matched to be slightly larger.  He does have the a sending thoracic aorta aneurysm measuring 3.7 cm.  He does have some lesions in the liver but I suspect these are not malignant lesions.  I think he sees his endocrinologist soon.  He did have a nice holiday season.  He did fall according to his wife.  He does get dizzy.  He says that this is because he does not get hydrated enough.  Overall, his performance status is ECOG 1.  Medications:  Current Outpatient Medications:  .  b complex vitamins tablet, Take 1 tablet by mouth daily., Disp: 100 tablet, Rfl: 3 .  Glycerin-Hypromellose-PEG 400 (DRY EYE RELIEF DROPS) 0.2-0.2-1 % SOLN, Place 1 drop into both eyes daily as needed (Dry eye)., Disp: , Rfl:  .  Ibuprofen-diphenhydrAMINE Cit (ADVIL PM PO), Take by mouth at bedtime as needed., Disp: , Rfl:  .  imiquimod (ALDARA) 5 % cream, SMARTSIG:1 Packet(s) Topical Every Night, Disp: , Rfl:  .  levothyroxine (SYNTHROID) 175 MCG tablet, Take 175 mcg by mouth daily before breakfast., Disp: , Rfl:  .  tamsulosin (FLOMAX) 0.4 MG CAPS capsule, TAKE 1 CAPSULE BY MOUTH DAILY AFTER SUPPER (Patient taking differently: Take 0.4 mg by mouth at bedtime.), Disp: 90 capsule, Rfl: 3 .  VITAMIN D,  CHOLECALCIFEROL, PO, Take by mouth., Disp: , Rfl:  .  warfarin (COUMADIN) 5 MG tablet, TAKE 1 TABLET DAILY OR AS DIRECTED BY ANTICOAGULATION CLINIC (Patient taking differently: Take 5 mg by mouth daily.), Disp: 90 tablet, Rfl: 1  Allergies: No Known Allergies  Past Medical History, Surgical history, Social history, and Family History were reviewed and updated.  Review of Systems: Review of Systems  Constitutional: Negative.   HENT:  Negative.   Eyes: Negative.   Respiratory: Negative.   Cardiovascular: Negative.   Gastrointestinal: Negative.   Endocrine: Negative.   Genitourinary: Negative.    Musculoskeletal: Negative.   Skin: Negative.   Neurological: Negative.   Hematological: Negative.   Psychiatric/Behavioral: Negative.     Physical Exam:  weight is 206 lb (93.4 kg). His oral temperature is 97.4 F (36.3 C) (abnormal). His blood pressure is 149/86 (abnormal) and his pulse is 58 (abnormal). His respiration is 20 and oxygen saturation is 99%.   Wt Readings from Last 3 Encounters:  03/25/20 206 lb (93.4 kg)  03/10/20 203 lb (92.1 kg)  12/22/19 205 lb (93 kg)    Physical Exam Vitals reviewed.  HENT:     Head: Normocephalic and atraumatic.  Eyes:     Pupils: Pupils are equal, round, and reactive to light.  Cardiovascular:     Rate and Rhythm: Normal rate  and regular rhythm.     Heart sounds: Normal heart sounds.  Pulmonary:     Effort: Pulmonary effort is normal.     Breath sounds: Normal breath sounds.  Abdominal:     General: Bowel sounds are normal.     Palpations: Abdomen is soft.  Musculoskeletal:        General: No tenderness or deformity. Normal range of motion.     Cervical back: Normal range of motion.  Lymphadenopathy:     Cervical: No cervical adenopathy.  Skin:    General: Skin is warm and dry.     Findings: No erythema or rash.  Neurological:     Mental Status: He is alert and oriented to person, place, and time.  Psychiatric:        Behavior:  Behavior normal.        Thought Content: Thought content normal.        Judgment: Judgment normal.      Lab Results  Component Value Date   WBC 6.1 03/25/2020   HGB 13.3 03/25/2020   HCT 42.3 03/25/2020   MCV 89.2 03/25/2020   PLT 176 03/25/2020     Chemistry      Component Value Date/Time   NA 138 03/25/2020 1315   K 4.8 03/25/2020 1315   CL 102 03/25/2020 1315   CO2 33 (H) 03/25/2020 1315   BUN 19 03/25/2020 1315   CREATININE 1.27 (H) 03/25/2020 1315      Component Value Date/Time   CALCIUM 9.6 03/25/2020 1315   ALKPHOS 70 03/25/2020 1315   AST 24 03/25/2020 1315   ALT 18 03/25/2020 1315   BILITOT 0.6 03/25/2020 1315      Impression and Plan: Clarence Hopkins is a very nice 87 year old white male.  He has metastatic thyroid cancer.  This is differentiated thyroid cancer.  Thankfully it is responsive to the radioactive iodine.  He is still responding.  For right now, we will plan for another CT scan on him in 3 months.  I think this would be very reasonable.  I am just happy that his quality of life and his overall performance status is doing so well right now.     [name], MD 2/10/20223:16 PM

## 2020-03-26 ENCOUNTER — Other Ambulatory Visit: Payer: Self-pay | Admitting: Hematology & Oncology

## 2020-03-26 DIAGNOSIS — C73 Malignant neoplasm of thyroid gland: Secondary | ICD-10-CM

## 2020-03-26 NOTE — Progress Notes (Signed)
Ct chest

## 2020-03-30 ENCOUNTER — Ambulatory Visit (INDEPENDENT_AMBULATORY_CARE_PROVIDER_SITE_OTHER): Payer: Medicare Other | Admitting: General Practice

## 2020-03-30 ENCOUNTER — Other Ambulatory Visit: Payer: Self-pay

## 2020-03-30 DIAGNOSIS — Z7901 Long term (current) use of anticoagulants: Secondary | ICD-10-CM

## 2020-03-30 DIAGNOSIS — Z86718 Personal history of other venous thrombosis and embolism: Secondary | ICD-10-CM

## 2020-03-30 LAB — POCT INR: INR: 2.2 (ref 2.0–3.0)

## 2020-03-30 NOTE — Patient Instructions (Addendum)
Pre visit review using our clinic review tool, if applicable. No additional management support is needed unless otherwise documented below in the visit note.  Continue to take 1 (5 mg) tablet daily except take 1 1/2 tablets every Monday.  Re-check in 6 weeks.

## 2020-04-01 LAB — THYROGLOBULIN LEVEL: Thyroglobulin: 25 ng/mL

## 2020-04-11 NOTE — Progress Notes (Signed)
Medical screening examination/treatment/procedure(s) were performed by non-physician practitioner and as supervising physician I was immediately available for consultation/collaboration. I agree with above. Deja Pisarski, MD  

## 2020-05-11 ENCOUNTER — Other Ambulatory Visit: Payer: Self-pay

## 2020-05-11 ENCOUNTER — Ambulatory Visit (INDEPENDENT_AMBULATORY_CARE_PROVIDER_SITE_OTHER): Payer: Medicare Other | Admitting: General Practice

## 2020-05-11 DIAGNOSIS — Z85828 Personal history of other malignant neoplasm of skin: Secondary | ICD-10-CM | POA: Diagnosis not present

## 2020-05-11 DIAGNOSIS — L570 Actinic keratosis: Secondary | ICD-10-CM | POA: Diagnosis not present

## 2020-05-11 DIAGNOSIS — Z7901 Long term (current) use of anticoagulants: Secondary | ICD-10-CM

## 2020-05-11 DIAGNOSIS — Z85820 Personal history of malignant melanoma of skin: Secondary | ICD-10-CM | POA: Diagnosis not present

## 2020-05-11 DIAGNOSIS — L821 Other seborrheic keratosis: Secondary | ICD-10-CM | POA: Diagnosis not present

## 2020-05-11 DIAGNOSIS — C44719 Basal cell carcinoma of skin of left lower limb, including hip: Secondary | ICD-10-CM | POA: Diagnosis not present

## 2020-05-11 DIAGNOSIS — L820 Inflamed seborrheic keratosis: Secondary | ICD-10-CM | POA: Diagnosis not present

## 2020-05-11 DIAGNOSIS — D1801 Hemangioma of skin and subcutaneous tissue: Secondary | ICD-10-CM | POA: Diagnosis not present

## 2020-05-11 DIAGNOSIS — C4441 Basal cell carcinoma of skin of scalp and neck: Secondary | ICD-10-CM | POA: Diagnosis not present

## 2020-05-11 LAB — POCT INR: INR: 2.6 (ref 2.0–3.0)

## 2020-05-11 NOTE — Progress Notes (Signed)
Medical screening examination/treatment/procedure(s) were performed by non-physician practitioner and as supervising physician I was immediately available for consultation/collaboration. I agree with above. Biddie Sebek, MD   

## 2020-05-11 NOTE — Patient Instructions (Addendum)
Pre visit review using our clinic review tool, if applicable. No additional management support is needed unless otherwise documented below in the visit note.  Change dosage and take 1 (5 mg) tablet daily.  Re-check in 6 weeks.

## 2020-05-12 ENCOUNTER — Telehealth: Payer: Self-pay | Admitting: Internal Medicine

## 2020-05-12 NOTE — Telephone Encounter (Signed)
Clarence Hopkins from [facility] requesting call to discuss forms needed to consider patient for admission to their facility. She states form faxed several times  Phone [phone]

## 2020-05-12 NOTE — Telephone Encounter (Signed)
Clarence Hopkins w/ [facility] called and was wondering if a fax has been received. She said she faxed it Monday and today to the main fax number and the alternative. She was wondering if there was a way to email the papers. She can be reached at [phone]

## 2020-05-12 NOTE — Telephone Encounter (Signed)
Clled Clarence Hopkins back there was no answer LMOM have not seen any admission forms on pt. But can faxed to [phone].Marland KitchenJohny Chess

## 2020-05-12 NOTE — Telephone Encounter (Signed)
Forms was received today placed on MD to sign.Marland KitchenJohny Chess

## 2020-05-17 DIAGNOSIS — Z0279 Encounter for issue of other medical certificate: Secondary | ICD-10-CM

## 2020-05-17 NOTE — Telephone Encounter (Signed)
[facility] got the 2 page questionnaire and the immunization and medications but she still needs notes from his last visit with Dr. Alain Marion

## 2020-05-17 NOTE — Telephone Encounter (Signed)
Faxed last ov from 03/10/20.Marland KitchenJohny Chess

## 2020-05-17 NOTE — Telephone Encounter (Signed)
MD has completed and have been faxed bck to [facility].Marland KitchenJohny Chess

## 2020-06-16 DIAGNOSIS — C73 Malignant neoplasm of thyroid gland: Secondary | ICD-10-CM | POA: Diagnosis not present

## 2020-06-16 DIAGNOSIS — C799 Secondary malignant neoplasm of unspecified site: Secondary | ICD-10-CM | POA: Diagnosis not present

## 2020-06-16 DIAGNOSIS — E890 Postprocedural hypothyroidism: Secondary | ICD-10-CM | POA: Diagnosis not present

## 2020-06-22 ENCOUNTER — Other Ambulatory Visit: Payer: Self-pay

## 2020-06-22 ENCOUNTER — Ambulatory Visit (INDEPENDENT_AMBULATORY_CARE_PROVIDER_SITE_OTHER): Payer: Medicare Other | Admitting: General Practice

## 2020-06-22 DIAGNOSIS — Z7901 Long term (current) use of anticoagulants: Secondary | ICD-10-CM

## 2020-06-22 DIAGNOSIS — Z86718 Personal history of other venous thrombosis and embolism: Secondary | ICD-10-CM

## 2020-06-22 LAB — POCT INR: INR: 2.7 (ref 2.0–3.0)

## 2020-06-22 NOTE — Patient Instructions (Signed)
Pre visit review using our clinic review tool, if applicable. No additional management support is needed unless otherwise documented below in the visit note.  Continue to take 1 (5 mg) tablet daily.  Re-check in 6 weeks.

## 2020-06-23 DIAGNOSIS — C44519 Basal cell carcinoma of skin of other part of trunk: Secondary | ICD-10-CM | POA: Diagnosis not present

## 2020-06-23 DIAGNOSIS — D485 Neoplasm of uncertain behavior of skin: Secondary | ICD-10-CM | POA: Diagnosis not present

## 2020-06-23 DIAGNOSIS — C44719 Basal cell carcinoma of skin of left lower limb, including hip: Secondary | ICD-10-CM | POA: Diagnosis not present

## 2020-06-23 DIAGNOSIS — L570 Actinic keratosis: Secondary | ICD-10-CM | POA: Diagnosis not present

## 2020-06-23 DIAGNOSIS — Z85820 Personal history of malignant melanoma of skin: Secondary | ICD-10-CM | POA: Diagnosis not present

## 2020-06-23 DIAGNOSIS — C4441 Basal cell carcinoma of skin of scalp and neck: Secondary | ICD-10-CM | POA: Diagnosis not present

## 2020-06-23 DIAGNOSIS — Z85828 Personal history of other malignant neoplasm of skin: Secondary | ICD-10-CM | POA: Diagnosis not present

## 2020-06-24 ENCOUNTER — Other Ambulatory Visit: Payer: Self-pay

## 2020-06-24 ENCOUNTER — Other Ambulatory Visit (HOSPITAL_BASED_OUTPATIENT_CLINIC_OR_DEPARTMENT_OTHER): Payer: Self-pay

## 2020-06-24 ENCOUNTER — Inpatient Hospital Stay (HOSPITAL_BASED_OUTPATIENT_CLINIC_OR_DEPARTMENT_OTHER): Payer: Medicare Other | Admitting: Hematology & Oncology

## 2020-06-24 ENCOUNTER — Ambulatory Visit (HOSPITAL_BASED_OUTPATIENT_CLINIC_OR_DEPARTMENT_OTHER)
Admission: RE | Admit: 2020-06-24 | Discharge: 2020-06-24 | Disposition: A | Discharge status: Home / Self Care | Payer: Medicare Other | Source: Ambulatory Visit | Attending: Hematology & Oncology | Admitting: Hematology & Oncology

## 2020-06-24 ENCOUNTER — Encounter: Payer: Self-pay | Admitting: Hematology & Oncology

## 2020-06-24 ENCOUNTER — Inpatient Hospital Stay: Payer: Medicare Other | Attending: Hematology & Oncology

## 2020-06-24 VITALS — BP 172/75 | HR 58 | Temp 98.4°F | Resp 18 | Wt 207.0 lb

## 2020-06-24 DIAGNOSIS — I712 Thoracic aortic aneurysm, without rupture: Secondary | ICD-10-CM | POA: Insufficient documentation

## 2020-06-24 DIAGNOSIS — R001 Bradycardia, unspecified: Secondary | ICD-10-CM | POA: Diagnosis not present

## 2020-06-24 DIAGNOSIS — C73 Malignant neoplasm of thyroid gland: Secondary | ICD-10-CM

## 2020-06-24 DIAGNOSIS — I2510 Atherosclerotic heart disease of native coronary artery without angina pectoris: Secondary | ICD-10-CM | POA: Diagnosis not present

## 2020-06-24 DIAGNOSIS — I517 Cardiomegaly: Secondary | ICD-10-CM | POA: Diagnosis not present

## 2020-06-24 DIAGNOSIS — I700 Atherosclerosis of aorta: Secondary | ICD-10-CM | POA: Diagnosis not present

## 2020-06-24 LAB — CBC WITH DIFFERENTIAL (CANCER CENTER ONLY)
Abs Immature Granulocytes: 0.02 10*3/uL (ref 0.00–0.07)
Basophils Absolute: 0 10*3/uL (ref 0.0–0.1)
Basophils Relative: 1 %
Eosinophils Absolute: 0.1 10*3/uL (ref 0.0–0.5)
Eosinophils Relative: 3 %
HCT: 41.3 % (ref 39.0–52.0)
Hemoglobin: 13.1 g/dL (ref 13.0–17.0)
Immature Granulocytes: 0 %
Lymphocytes Relative: 26 %
Lymphs Abs: 1.4 10*3/uL (ref 0.7–4.0)
MCH: 28.2 pg (ref 26.0–34.0)
MCHC: 31.7 g/dL (ref 30.0–36.0)
MCV: 89 fL (ref 80.0–100.0)
Monocytes Absolute: 0.6 10*3/uL (ref 0.1–1.0)
Monocytes Relative: 11 %
Neutro Abs: 3.2 10*3/uL (ref 1.7–7.7)
Neutrophils Relative %: 59 %
Platelet Count: 175 10*3/uL (ref 150–400)
RBC: 4.64 MIL/uL (ref 4.22–5.81)
RDW: 12.8 % (ref 11.5–15.5)
WBC Count: 5.3 10*3/uL (ref 4.0–10.5)
nRBC: 0 % (ref 0.0–0.2)

## 2020-06-24 LAB — CMP (CANCER CENTER ONLY)
ALT: 13 U/L (ref 0–44)
AST: 18 U/L (ref 15–41)
Albumin: 3.9 g/dL (ref 3.5–5.0)
Alkaline Phosphatase: 66 U/L (ref 38–126)
Anion gap: 5 (ref 5–15)
BUN: 18 mg/dL (ref 8–23)
CO2: 31 mmol/L (ref 22–32)
Calcium: 9.6 mg/dL (ref 8.9–10.3)
Chloride: 100 mmol/L (ref 98–111)
Creatinine: 1.25 mg/dL — ABNORMAL HIGH (ref 0.61–1.24)
GFR, Estimated: 56 mL/min — ABNORMAL LOW (ref >60)
Glucose, Bld: 84 mg/dL (ref 70–99)
Potassium: 4.6 mmol/L (ref 3.5–5.1)
Sodium: 136 mmol/L (ref 135–145)
Total Bilirubin: 0.7 mg/dL (ref 0.3–1.2)
Total Protein: 6.6 g/dL (ref 6.5–8.1)

## 2020-06-24 NOTE — Progress Notes (Signed)
Hematology and Oncology Follow Up Visit  Clarence Hopkins [id number] April 16, 1930 87 y.o. 06/24/2020   Principle Diagnosis:   Metastatic papillary carcinoma of the thyroid  Current Therapy:    S/p I-131 therapy on 08/27/2019     Interim History:  Clarence Hopkins is back for follow-up.  We were able to get his CT scan done today.  Thankfully, the CT scan showed everything was very stable.  The right infrahilar mass measures 3.6 x 2.8 cm.  In the left lobe, index nodule measures 2.9 x 2.4 cm.  He has a kidney cyst in the right kidney.  He has a 3.7 x 3.5 cm descending thoracic aorta aneurysm.  His problem is that he is fatigued and tired.  I actually did an EKG on him.  He does have some bradycardia with PVCs.  I think that this might be an issue.  He is on no beta-blocker.  I think that is probably going to need to be seen by cardiology and possibly have a Holter monitor to see where his heart rate is doing.  I will speak to his family doctor, Dr. Alain Marion, about this.  He has had no problems with cough.  He has had no fever.  He is eating pretty well.  He has had no change in bowel or bladder habits.  He has had no nausea or vomiting.  He recently saw his endocrinologist, Dr. Hartford Poli, who is doing a great job with the follow-up for his thyroid cancer.  Currently, I would say his performance status is probably ECOG 2.    Medications:  Current Outpatient Medications:  .  b complex vitamins tablet, Take 1 tablet by mouth daily., Disp: 100 tablet, Rfl: 3 .  Glycerin-Hypromellose-PEG 400 (DRY EYE RELIEF DROPS) 0.2-0.2-1 % SOLN, Place 1 drop into both eyes daily as needed (Dry eye)., Disp: , Rfl:  .  Ibuprofen-diphenhydrAMINE Cit (ADVIL PM PO), Take by mouth at bedtime as needed., Disp: , Rfl:  .  imiquimod (ALDARA) 5 % cream, SMARTSIG:1 Packet(s) Topical Every Night, Disp: , Rfl:  .  levothyroxine (SYNTHROID) 175 MCG tablet, Take 175 mcg by mouth daily before breakfast., Disp: , Rfl:  .  tamsulosin  (FLOMAX) 0.4 MG CAPS capsule, TAKE 1 CAPSULE BY MOUTH DAILY AFTER SUPPER (Patient taking differently: Take 0.4 mg by mouth at bedtime.), Disp: 90 capsule, Rfl: 3 .  VITAMIN D, CHOLECALCIFEROL, PO, Take by mouth., Disp: , Rfl:  .  warfarin (COUMADIN) 5 MG tablet, TAKE 1 TABLET DAILY OR AS DIRECTED BY ANTICOAGULATION CLINIC (Patient taking differently: Take 5 mg by mouth daily.), Disp: 90 tablet, Rfl: 1  Allergies: No Known Allergies  Past Medical History, Surgical history, Social history, and Family History were reviewed and updated.  Review of Systems: Review of Systems  Constitutional: Negative.   HENT:  Negative.   Eyes: Negative.   Respiratory: Negative.   Cardiovascular: Negative.   Gastrointestinal: Negative.   Endocrine: Negative.   Genitourinary: Negative.    Musculoskeletal: Negative.   Skin: Negative.   Neurological: Negative.   Hematological: Negative.   Psychiatric/Behavioral: Negative.     Physical Exam:  vitals were not taken for this visit.   Wt Readings from Last 3 Encounters:  03/25/20 206 lb (93.4 kg)  03/10/20 203 lb (92.1 kg)  12/22/19 205 lb (93 kg)    Physical Exam Vitals reviewed.  HENT:     Head: Normocephalic and atraumatic.  Eyes:     Pupils: Pupils are equal, round, and reactive  to light.  Cardiovascular:     Heart sounds: Normal heart sounds.     Comments: Cardiac exam is slow but regular.  He does have occasional extra beats.  He does have an occasional skipped beat.  He has 1/6 systolic ejection murmur. Pulmonary:     Effort: Pulmonary effort is normal.     Breath sounds: Normal breath sounds.  Abdominal:     General: Bowel sounds are normal.     Palpations: Abdomen is soft.  Musculoskeletal:        General: No tenderness or deformity. Normal range of motion.     Cervical back: Normal range of motion.  Lymphadenopathy:     Cervical: No cervical adenopathy.  Skin:    General: Skin is warm and dry.     Findings: No erythema or rash.   Neurological:     Mental Status: He is alert and oriented to person, place, and time.  Psychiatric:        Behavior: Behavior normal.        Thought Content: Thought content normal.        Judgment: Judgment normal.      Lab Results  Component Value Date   WBC 5.3 06/24/2020   HGB 13.1 06/24/2020   HCT 41.3 06/24/2020   MCV 89.0 06/24/2020   PLT 175 06/24/2020     Chemistry      Component Value Date/Time   NA 136 06/24/2020 1308   K 4.6 06/24/2020 1308   CL 100 06/24/2020 1308   CO2 31 06/24/2020 1308   BUN 18 06/24/2020 1308   CREATININE 1.25 (H) 06/24/2020 1308      Component Value Date/Time   CALCIUM 9.6 06/24/2020 1308   ALKPHOS 66 06/24/2020 1308   AST 18 06/24/2020 1308   ALT 13 06/24/2020 1308   BILITOT 0.7 06/24/2020 1308      Impression and Plan: Clarence Hopkins is a very nice 87 year old white male.  He has metastatic thyroid cancer.  This is differentiated thyroid cancer.  Thankfully it is responsive to the radioactive iodine.  He is still responding.    To me, I think to be primary now is his overall fatigue.  Again I did an EKG on him.  He does have sinus bradycardia with some PVCs.  I do think this needs to be evaluated.  Again I will speak with his primary care physician to ensure we will arrange for cardiology.  As far as the thyroid cancer is concerned, we will still scan him in about 3 months.  We will try to get this done actually, after Labor Day weekend.  Given Clarence Hopkins age, I really want to make sure his quality of life is the primary goal.  For right now, I just do not think that his a thyroid cancer is a factor with his quality of life.    For right now, we will plan for another CT scan on him in 3 months.  I think this would be very reasonable.  I am just happy that his quality of life and his overall performance status is doing so well right now.     [name], MD 5/12/20223:36 PM

## 2020-06-25 ENCOUNTER — Telehealth: Payer: Self-pay | Admitting: Internal Medicine

## 2020-06-25 NOTE — Progress Notes (Signed)
  Chronic Care Management   Note  06/25/2020 Name: Clarence Hopkins MRN: [id number] DOB: [birth year]  Clarence Hopkins is a 87 y.o. year old male who is a primary care patient of Plotnikov, Evie Lacks, MD. I reached out to Lucinda Dell by phone today in response to a referral sent by Clarence Hopkins PCP, Plotnikov, Evie Lacks, MD.   Clarence Hopkins was given information about Chronic Care Management services today including:  1. CCM service includes personalized support from designated clinical staff supervised by his physician, including individualized plan of care and coordination with other care providers 2. 24/7 contact phone numbers for assistance for urgent and routine care needs. 3. Service will only be billed when office clinical staff spend 20 minutes or more in a month to coordinate care. 4. Only one practitioner may furnish and bill the service in a calendar month. 5. The patient may stop CCM services at any time (effective at the end of the month) by phone call to the office staff.   Patient agreed to services and verbal consent obtained.   Follow up plan:  [place]

## 2020-06-28 NOTE — Progress Notes (Addendum)
.   Chronic Care Management Pharmacy Note  06/30/2020 Name:  Clarence Hopkins MRN:  [id number] DOB:  [birth year]  Subjective: Clarence Hopkins is an 87 y.o. year old male who is a primary patient of Plotnikov, Evie Lacks, MD.  The CCM team was consulted for assistance with disease management and care coordination needs.    Engaged with patient face to face for initial visit in response to provider referral for pharmacy case management and/or care coordination services.   Consent to Services:  The patient was given the following information about Chronic Care Management services today, agreed to services, and gave verbal consent: 1. CCM service includes personalized support from designated clinical staff supervised by the primary care provider, including individualized plan of care and coordination with other care providers 2. 24/7 contact phone numbers for assistance for urgent and routine care needs. 3. Service will only be billed when office clinical staff spend 20 minutes or more in a month to coordinate care. 4. Only one practitioner may furnish and bill the service in a calendar month. 5.The patient may stop CCM services at any time (effective at the end of the month) by phone call to the office staff. 6. The patient will be responsible for cost sharing (co-pay) of up to 20% of the service fee (after annual deductible is met). Patient agreed to services and consent obtained.  Patient Care Team: Plotnikov, Evie Lacks, MD as PCP - General (Internal Medicine) Shon Hough, MD as Consulting Physician (Ophthalmology) [name], MD as Consulting Physician (Dermatology) Oneita Kras, MD as Referring Physician (Endocrinology) [name], MD as Medical Oncologist (Oncology) Tomasa Blase, [facility] (Pharmacist) Tomasa Blase, [facility] as Pharmacist (Pharmacist)  Recent office visits: 03/10/20 - PCP visit - no changes    Recent consult visits: 06/24/20  [name] MD - Heme/Onc -  Metastatic papillary carcinoma of the thyroid - CT scan shows that things are stable  - on EKG - noted PVCs with bradycardia - referred to cardiology for evaluation  06/22/20 and 05/11/20 - INR check  03/07/20 - Dr. [name] - dermatology    [facility] visits: None in previous 6 months  Objective:  Lab Results  Component Value Date   CREATININE 1.25 (H) 06/24/2020   BUN 18 06/24/2020   GFR 60.20 06/03/2019   GFRNONAA 56 (L) 06/24/2020   GFRAA >60 10/21/2019   NA 136 06/24/2020   K 4.6 06/24/2020   CALCIUM 9.6 06/24/2020   CO2 31 06/24/2020   GLUCOSE 84 06/24/2020    Lab Results  Component Value Date/Time   GFR 60.20 06/03/2019 03:40 PM   GFR 68.40 03/14/2019 07:53 AM    Last diabetic Eye exam:  No results found for: HMDIABEYEEXA  Last diabetic Foot exam:  No results found for: HMDIABFOOTEX   Lab Results  Component Value Date   CHOL 141 03/14/2019   HDL 38.00 (L) 03/14/2019   LDLCALC 88 03/14/2019   LDLDIRECT 116.0 03/31/2013   TRIG 74.0 03/14/2019   CHOLHDL 4 03/14/2019    Hepatic Function Latest Ref Rng & Units 06/24/2020 03/25/2020 12/22/2019  Total Protein 6.5 - 8.1 g/dL 6.6 6.9 6.8  Albumin 3.5 - 5.0 g/dL 3.9 4.1 3.9  AST 15 - 41 U/L _0 ALT 0 - 44 U/L _1 Alk Phosphatase 38 - 126 U/L 66 70 64  Total Bilirubin 0.3 - 1.2 mg/dL 0.7 0.6 0.6  Bilirubin, Direct 0.0 - 0.3 mg/dL - - -  Lab Results  Component Value Date/Time   TSH 0.67 06/03/2019 03:40 PM   TSH 1.06 03/14/2019 07:53 AM   FREET4 1.29 06/03/2019 03:40 PM    CBC Latest Ref Rng & Units 06/24/2020 03/25/2020 12/22/2019  WBC 4.0 - 10.5 K/uL 5.3 6.1 4.7  Hemoglobin 13.0 - 17.0 g/dL 13.1 13.3 13.3  Hematocrit 39.0 - 52.0 % 41.3 42.3 42.1  Platelets 150 - 400 K/uL 175 176 152    Lab Results  Component Value Date/Time   VD25OH 81.58 06/03/2019 03:40 PM   VD25OH 51.09 03/07/2018 03:07 PM    Clinical ASCVD: No  The ASCVD Risk score Mikey Bussing DC Jr., et al., 2013) failed to calculate for the  following reasons:   The 2013 ASCVD risk score is only valid for ages 44 to 76    Depression screen PHQ 2/9 08/19/2019 03/10/2019 03/07/2018  Decreased Interest 0 0 0  Down, Depressed, Hopeless 0 0 0  PHQ - 2 Score 0 0 0  Altered sleeping - - -  Tired, decreased energy - - -  Change in appetite - - -  Feeling bad or failure about yourself  - - -  Trouble concentrating - - -  Moving slowly or fidgety/restless - - -  Suicidal thoughts - - -  PHQ-9 Score - - -  Difficult doing work/chores - - -  Some recent data might be hidden      Social History   Tobacco Use  Smoking Status Former Smoker   Packs/day: 0.75   Years: 41.00   Pack years: 30.75   Types: Cigarettes   Quit date: 02/13/1993   Years since quitting: 27.3  Smokeless Tobacco Never Used  Tobacco Comment   smoked 1956-1995, up to 1/2 ppd   BP Readings from Last 3 Encounters:  06/24/20 (!) 172/75  03/25/20 (!) 149/86  03/10/20 (!) 152/90   Pulse Readings from Last 3 Encounters:  06/24/20 (!) 58  03/25/20 (!) 58  03/10/20 76   Wt Readings from Last 3 Encounters:  06/24/20 207 lb (93.9 kg)  03/25/20 206 lb (93.4 kg)  03/10/20 203 lb (92.1 kg)   BMI Readings from Last 3 Encounters:  06/24/20 25.20 kg/m  03/25/20 25.08 kg/m  03/10/20 24.71 kg/m    Assessment/Interventions: Review of patient past medical history, allergies, medications, health status, including review of consultants reports, laboratory and other test data, was performed as part of comprehensive evaluation and provision of chronic care management services.   SDOH:  (Social Determinants of Health) assessments and interventions performed: Yes   SDOH Screenings   Alcohol Screen: Not on file  Depression (PHQ2-9): Low Risk    PHQ-2 Score: 0  Financial Resource Strain: Low Risk    Difficulty of Paying Living Expenses: Not hard at all  Food Insecurity: No Food Insecurity   Worried About Charity fundraiser in the Last Year: Never true   Ran Out  of Food in the Last Year: Never true  Housing: Low Risk    Last Housing Risk Score: 0  Physical Activity: Sufficiently Active   Days of Exercise per Week: 5 days   Minutes of Exercise per Session: 30 min  Social Connections: Engineer, building services of Communication with Friends and Family: More than three times a week   Frequency of Social Gatherings with Friends and Family: More than three times a week   Attends Religious Services: More than 4 times per year   Active Member of Clubs or Organizations: No  Attends Music therapist: More than 4 times per year   Marital Status: Married  Stress: No Stress Concern Present   Feeling of Stress : Not at all  Tobacco Use: Medium Risk   Smoking Tobacco Use: Former Smoker   Smokeless Tobacco Use: Never Used  Transportation Needs: No Data processing manager (Medical): No   Lack of Transportation (Non-Medical): No    CCM Care Plan  No Known Allergies  Medications Reviewed Today     Reviewed by Tomasa Blase, [facility] (Pharmacist) on 06/30/20 at 1151  Med List Status: <None>   Medication Order Taking? Sig Documenting Provider Last Dose Status Informant  b complex vitamins tablet [id number] No Take 1 tablet by mouth daily.  Patient not taking: Reported on 06/30/2020   Plotnikov, Evie Lacks, MD Not Taking Active Self  Biotin w/ Vitamins C & E (HAIR/SKIN/NAILS) 1250-7.5-7.5 MCG-MG-UNT CHEW [id number] Yes Chew 2 tablets by mouth daily. [provider] Taking Active   Glycerin-Hypromellose-PEG 400 (DRY EYE RELIEF DROPS) 0.2-0.2-1 % SOLN [id number] Yes Place 1 drop into both eyes daily as needed (Dry eye). [provider] Taking Active Self  Ibuprofen-diphenhydrAMINE Cit (ADVIL PM PO) [id number] Yes Take by mouth at bedtime as needed. [provider] Taking Active   imiquimod (ALDARA) 5 % cream [id number] No SMARTSIG:1 Packet(s) Topical Every Night  Patient not taking: Reported on  06/30/2020   [provider] Not Taking Active   levothyroxine (SYNTHROID) 175 MCG tablet [id number] Yes Take 175 mcg by mouth daily before breakfast. [provider] Taking Active   mineral oil liquid [id number] Yes Take 15 mLs by mouth daily as needed for moderate constipation. [provider] Taking Active            Med Note Delice Bison, Darnelle Maffucci   Wed Jun 30, 2020 11:32 AM) Reports that he is unsure of the dose that he takes    tamsulosin (FLOMAX) 0.4 MG CAPS capsule [id number] Yes TAKE 1 CAPSULE BY MOUTH DAILY AFTER SUPPER  Patient taking differently: Take 0.4 mg by mouth at bedtime.   Plotnikov, Evie Lacks, MD Taking Active   VITAMIN D, CHOLECALCIFEROL, PO [id number] No Take by mouth.  Patient not taking: Reported on 06/30/2020   [provider] Not Taking Active   warfarin (COUMADIN) 5 MG tablet [id number] Yes TAKE 1 TABLET DAILY OR AS DIRECTED BY ANTICOAGULATION CLINIC  Patient taking differently: Take 5 mg by mouth daily.   Plotnikov, Evie Lacks, MD Taking Active             Patient Active Problem List   Diagnosis Date Noted   Right lower lobe lung mass    DOE (dyspnea on exertion) 06/03/2019   Near syncope 06/03/2019   Dysphagia 03/07/2018   Thyroid cancer ([place]) 02/10/2016   Medicare annual wellness visit, subsequent 02/02/2015   Hypertriglyceridemia 12/22/2013   Encounter for therapeutic drug monitoring 04/09/2013   History of basal cell cancer 10/30/2012   Long term (current) use of anticoagulants 05/23/2010   Protein C deficiency ([place]) 11/05/2009   Post-surgical hypothyroidism 01/13/2008   Elevated PSA measurement 01/13/2008   Melanoma ([place]) 01/13/2008   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 02/15/2007   PULMONARY EMBOLISM, HX OF 11/09/2006    Immunization History  Administered Date(s) Administered   Influenza Split 11/13/2012   Influenza Whole 11/13/2009, 11/14/2011   Influenza, High Dose Seasonal PF 11/23/2015,  11/27/2016, 11/27/2017, 11/14/2018   Influenza-Unspecified 12/08/2013, 12/07/2014   PFIZER(Purple Top)SARS-COV-2 Vaccination  03/02/2019, 03/19/2019   Pneumococcal Conjugate-13 03/05/2017   Pneumococcal Polysaccharide-23 02/04/2016   Zoster 03/14/2006    Conditions to be addressed/monitored:  Hypertension, Hypothyroidism, BPH and History of PE   Care Plan : Elevated BP, Hypothyroidism, BPH, and History of PE  Updates made by Tomasa Blase, RPH since 06/30/2020 12:00 AM     Problem: Elevated BP, hypothyroidism, BPH, and History of PE   Priority: High  Onset Date: 06/30/2020     Long-Range Goal: Disease Management   Start Date: 06/30/2020  Expected End Date: 09/30/2020  This Visit's Progress: On track  Priority: High  Note:   Care Plan and Follow Up Patient Decision:  Patient agrees to Care Plan and Follow-up.  Plan: Telephone follow up appointment with care management team member scheduled for:  2 weeks  and The patient has been provided with contact information for the care management team and has been advised to call with any health related questions or concerns.   Current Barriers:  Unable to independently monitor therapeutic efficacy Unable to maintain control of blood pressure recently   Pharmacist Clinical Goal(s):  Patient will achieve adherence to monitoring guidelines and medication adherence to achieve therapeutic efficacy achieve control of blood pressure / HR  as evidenced by blood pressure / HR logs  through collaboration with PharmD and provider.   Interventions: 1:1 collaboration with Plotnikov, Evie Lacks, MD regarding development and update of comprehensive plan of care as evidenced by provider attestation and co-signature Inter-disciplinary care team collaboration (see longitudinal plan of care) Comprehensive medication review performed; medication list updated in electronic medical record  Hypertension (BP goal <140/90) -Uncontrolled -Current  treatment: N/a at this time - recently referred to cardiology 06/24/20 -Medications previously tried: n/a   - ECHO from 06/19/2019 - normal  -Current home readings: reports that blood pressure when checked in office 142-147/80 -Current dietary habits: reports that he consumes sodium reduced diet as is  -Current exercise habits: reports that his activity is limited as he finds himself to be short of breath after minimal exertion - previously would use exercise bike at gym daily (3-4 miles) and golfed regularly  -Reports hypotensive/hypertensive symptoms -Educated on BP goals and benefits of medications for prevention of heart attack, stroke and kidney damage; Daily salt intake goal < 2300 mg; Importance of home blood pressure monitoring; -Counseled to monitor BP at home daily to every other day, document, and provide log at future appointments -Recommended for patient to start monitoring blood pressure and recording - will f/u with patinet in 2 weeks to discuss BP, patient was recommended for referral to cardiology - echoing heme/onc provider on recommendation and evaluation by CARDS due to patient reports DOE - discussed possible start of ACE/ARB, decided at ths time to hold until better knowledge of current BP status is known  Hypothyroidism (Goal: continued Control / Prevention of disease progression ) -Controlled -Current treatment  Levothyroxine 15mg daily  -Medications previously tried: armour thyroid 12369m -Recommended to continue current medication   BPH (Goal: Continued Control / Prevention of Disease progression) -Controlled -Current treatment  Tamsulosin 0.69m81maily  -Medications previously tried: n/a  -Recommended to continue current medication   History of PE (Goal: Prevention of Thrombus) -Controlled -Current treatment  Warfarin 5mg38mas directed by coumadin clinic  -Medications previously tried: n/a -Recommended to stop use of Advil PM Counseled on Risks of bleeding  associated with NSAID (ibuprofen) use Recommended For patient to stop advil PM at this time, patient is taking  for sleep, recommended for patient to use unisom / benadryl as this is the active ingredient/related to the active ingredient in the Advil PM that is helping with his sleep - recommended for patient to use APAP or voltaren gel if needed for pain   Health Maintenance -Current therapy:  Nature's Bounty Hair, Nail, and Skin chewable tablets - 2 tablets daily  Mineral oil - prn for constipation  Dry-eye relief drops  -Educated on Herbal supplement research is limited and benefits usually cannot be proven Cost vs benefit of each product must be carefully weighed by individual consumer -Patient is satisfied with current therapy and denies issues -Recommended to continue current medication   Patient Goals/Self-Care Activities Patient will:  - take medications as prescribed check blood pressure once daily to every other day , document, and provide at future appointments engage in dietary modifications by continuing reduced sodium diet   Follow Up Plan: Telephone follow up appointment with care management team member scheduled for: The patient has been provided with contact information for the care management team and has been advised to call with any health related questions or concerns.        Medication Assistance: None required.  Patient affirms current coverage meets needs.  Patient's preferred pharmacy is:  [id number] - [place], [place] [address] [place] [address] Phone: [phone] Fax: [phone]  [id number] [place] [id number]: [phone]: [phone]  Uses pill box? No - takes all of his medications in the morning Pt endorses 100%  compliance  DTomasa Blase PharmD Clinical Pharmacist, LGraysvillescreening examination/treatment/procedure(s) were performed by non-physician practitioner and as supervising physician I was immediately available for consultation/collaboration.  I agree with above. ALew Dawes MD

## 2020-06-30 ENCOUNTER — Ambulatory Visit (INDEPENDENT_AMBULATORY_CARE_PROVIDER_SITE_OTHER): Payer: Medicare Other

## 2020-06-30 ENCOUNTER — Other Ambulatory Visit: Payer: Self-pay

## 2020-06-30 DIAGNOSIS — Z86718 Personal history of other venous thrombosis and embolism: Secondary | ICD-10-CM

## 2020-06-30 DIAGNOSIS — E890 Postprocedural hypothyroidism: Secondary | ICD-10-CM | POA: Diagnosis not present

## 2020-06-30 DIAGNOSIS — Z5181 Encounter for therapeutic drug level monitoring: Secondary | ICD-10-CM

## 2020-06-30 DIAGNOSIS — R0600 Dyspnea, unspecified: Secondary | ICD-10-CM

## 2020-06-30 DIAGNOSIS — R972 Elevated prostate specific antigen [PSA]: Secondary | ICD-10-CM

## 2020-06-30 DIAGNOSIS — R030 Elevated blood-pressure reading, without diagnosis of hypertension: Secondary | ICD-10-CM

## 2020-06-30 DIAGNOSIS — R0609 Other forms of dyspnea: Secondary | ICD-10-CM

## 2020-06-30 DIAGNOSIS — Z7901 Long term (current) use of anticoagulants: Secondary | ICD-10-CM

## 2020-06-30 NOTE — Patient Instructions (Signed)
How to Take Your Blood Pressure Blood pressure measures how strongly your blood is pressing against the walls of your arteries. Arteries are blood vessels that carry blood from your heart throughout your body. You can take your blood pressure at home with a machine. You may need to check your blood pressure at home:  To check if you have high blood pressure (hypertension).  To check your blood pressure over time.  To make sure your blood pressure medicine is working. Supplies needed:  Blood pressure machine, or monitor.  Dining room chair to sit in.  Table or desk.  Small notebook.  Pencil or pen. How to prepare Avoid these things for 30 minutes before checking your blood pressure:  Having drinks with caffeine in them, such as coffee or tea.  Drinking alcohol.  Eating.  Smoking.  Exercising. Do these things five minutes before checking your blood pressure:  Go to the bathroom and pee (urinate).  Sit in a dining chair. Do not sit in a soft couch or an armchair.  Be quiet. Do not talk. How to take your blood pressure Follow the instructions that came with your machine. If you have a digital blood pressure monitor, these may be the instructions: 1. Sit up straight. 2. Place your feet on the floor. Do not cross your ankles or legs. 3. Rest your left arm at the level of your heart. You may rest it on a table, desk, or chair. 4. Pull up your shirt sleeve. 5. Wrap the blood pressure cuff around the upper part of your left arm. The cuff should be 1 inch (2.5 cm) above your elbow. It is best to wrap the cuff around bare skin. 6. Fit the cuff snugly around your arm. You should be able to place only one finger between the cuff and your arm. 7. Place the cord so that it rests in the bend of your elbow. 8. Press the power button. 9. Sit quietly while the cuff fills with air and loses air. 10. Write down the numbers on the screen. 11. Wait 2-3 minutes and then repeat steps 1-10.    What do the numbers mean? Two numbers make up your blood pressure. The first number is called systolic pressure. The second is called diastolic pressure. An example of a blood pressure reading is "120 over 80" (or 120/80). If you are an adult and do not have a medical condition, use this guide to find out if your blood pressure is normal: Normal  First number: below 120.  Second number: below 80. Elevated  First number: 120-129.  Second number: below 80. Hypertension stage 1  First number: 130-139.  Second number: 80-89. Hypertension stage 2  First number: 140 or above.  Second number: 23 or above. Your blood pressure is above normal even if only the top or bottom number is above normal. Follow these instructions at home:  Check your blood pressure as often as your doctor tells you to.  Check your blood pressure at the same time every day.  Take your monitor to your next doctor's appointment. Your doctor will: ? Make sure you are using it correctly. ? Make sure it is working right.  Make sure you understand what your blood pressure numbers should be.  Tell your doctor if your medicine is causing side effects.  Keep all follow-up visits as told by your doctor. This is important. General tips:  You will need a blood pressure machine, or monitor. Your doctor can suggest a  monitor. You can buy one at a drugstore or online. When choosing one: ? Choose one with an arm cuff. ? Choose one that wraps around your upper arm. Only one finger should fit between your arm and the cuff. ? Do not choose one that measures your blood pressure from your wrist or finger. Where to find more information American Heart Association: www.heart.org Contact a doctor if:  Your blood pressure keeps being high. Get help right away if:  Your first blood pressure number is higher than 180.  Your second blood pressure number is higher than 120. Summary  Check your blood pressure at the same  time every day.  Avoid caffeine, alcohol, smoking, and exercise for 30 minutes before checking your blood pressure.  Make sure you understand what your blood pressure numbers should be. This information is not intended to replace advice given to you by your health care provider. Make sure you discuss any questions you have with your health care provider. Document Revised: 01/24/2019 Document Reviewed: 01/24/2019 Elsevier Patient Education  2021 [place].  Hypertension, Adult High blood pressure (hypertension) is when the force of blood pumping through the arteries is too strong. The arteries are the blood vessels that carry blood from the heart throughout the body. Hypertension forces the heart to work harder to pump blood and may cause arteries to become narrow or stiff. Untreated or uncontrolled hypertension can cause a heart attack, heart failure, a stroke, kidney disease, and other problems. A blood pressure reading consists of a higher number over a lower number. Ideally, your blood pressure should be below 120/80. The first ("top") number is called the systolic pressure. It is a measure of the pressure in your arteries as your heart beats. The second ("bottom") number is called the diastolic pressure. It is a measure of the pressure in your arteries as the heart relaxes. What are the causes? The exact cause of this condition is not known. There are some conditions that result in or are related to high blood pressure. What increases the risk? Some risk factors for high blood pressure are under your control. The following factors may make you more likely to develop this condition:  Smoking.  Having type 2 diabetes mellitus, high cholesterol, or both.  Not getting enough exercise or physical activity.  Being overweight.  Having too much fat, sugar, calories, or salt (sodium) in your diet.  Drinking too much alcohol. Some risk factors for high blood pressure may be difficult or  impossible to change. Some of these factors include:  Having chronic kidney disease.  Having a family history of high blood pressure.  Age. Risk increases with age.  Race. You may be at higher risk if you are African American.  Gender. Men are at higher risk than women before age 77. After age 10, women are at higher risk than men.  Having obstructive sleep apnea.  Stress. What are the signs or symptoms? High blood pressure may not cause symptoms. Very high blood pressure (hypertensive crisis) may cause:  Headache.  Anxiety.  Shortness of breath.  Nosebleed.  Nausea and vomiting.  Vision changes.  Severe chest pain.  Seizures. How is this diagnosed? This condition is diagnosed by measuring your blood pressure while you are seated, with your arm resting on a flat surface, your legs uncrossed, and your feet flat on the floor. The cuff of the blood pressure monitor will be placed directly against the skin of your upper arm at the level of your  heart. It should be measured at least twice using the same arm. Certain conditions can cause a difference in blood pressure between your right and left arms. Certain factors can cause blood pressure readings to be lower or higher than normal for a short period of time:  When your blood pressure is higher when you are in a health care provider's office than when you are at home, this is called white coat hypertension. Most people with this condition do not need medicines.  When your blood pressure is higher at home than when you are in a health care provider's office, this is called masked hypertension. Most people with this condition may need medicines to control blood pressure. If you have a high blood pressure reading during one visit or you have normal blood pressure with other risk factors, you may be asked to:  Return on a different day to have your blood pressure checked again.  Monitor your blood pressure at home for 1 week or  longer. If you are diagnosed with hypertension, you may have other blood or imaging tests to help your health care provider understand your overall risk for other conditions. How is this treated? This condition is treated by making healthy lifestyle changes, such as eating healthy foods, exercising more, and reducing your alcohol intake. Your health care provider may prescribe medicine if lifestyle changes are not enough to get your blood pressure under control, and if:  Your systolic blood pressure is above 130.  Your diastolic blood pressure is above 80. Your personal target blood pressure may vary depending on your medical conditions, your age, and other factors. Follow these instructions at home: Eating and drinking  Eat a diet that is high in fiber and potassium, and low in sodium, added sugar, and fat. An example eating plan is called the DASH (Dietary Approaches to Stop Hypertension) diet. To eat this way: ? Eat plenty of fresh fruits and vegetables. Try to fill one half of your plate at each meal with fruits and vegetables. ? Eat whole grains, such as whole-wheat pasta, [place] rice, or whole-grain bread. Fill about one fourth of your plate with whole grains. ? Eat or drink low-fat dairy products, such as skim milk or low-fat yogurt. ? Avoid fatty cuts of meat, processed or cured meats, and poultry with skin. Fill about one fourth of your plate with lean proteins, such as fish, chicken without skin, beans, eggs, or tofu. ? Avoid pre-made and processed foods. These tend to be higher in sodium, added sugar, and fat.  Reduce your daily sodium intake. Most people with hypertension should eat less than 1,500 mg of sodium a day.  Do not drink alcohol if: ? Your health care provider tells you not to drink. ? You are pregnant, may be pregnant, or are planning to become pregnant.  If you drink alcohol: ? Limit how much you use to:  0-1 drink a day for women.  0-2 drinks a day for  men. ? Be aware of how much alcohol is in your drink. In the [place] one drink equals one 12 oz bottle of beer (355 mL), one 5 oz glass of wine (148 mL), or one 1 oz glass of hard liquor (44 mL).   Lifestyle  Work with your health care provider to maintain a healthy body weight or to lose weight. Ask what an ideal weight is for you.  Get at least 30 minutes of exercise most days of the week. Activities may include walking, swimming,  or biking.  Include exercise to strengthen your muscles (resistance exercise), such as Pilates or lifting weights, as part of your weekly exercise routine. Try to do these types of exercises for 30 minutes at least 3 days a week.  Do not use any products that contain nicotine or tobacco, such as cigarettes, e-cigarettes, and chewing tobacco. If you need help quitting, ask your health care provider.  Monitor your blood pressure at home as told by your health care provider.  Keep all follow-up visits as told by your health care provider. This is important.   Medicines  Take over-the-counter and prescription medicines only as told by your health care provider. Follow directions carefully. Blood pressure medicines must be taken as prescribed.  Do not skip doses of blood pressure medicine. Doing this puts you at risk for problems and can make the medicine less effective.  Ask your health care provider about side effects or reactions to medicines that you should watch for. Contact a health care provider if you:  Think you are having a reaction to a medicine you are taking.  Have headaches that keep coming back (recurring).  Feel dizzy.  Have swelling in your ankles.  Have trouble with your vision. Get help right away if you:  Develop a severe headache or confusion.  Have unusual weakness or numbness.  Feel faint.  Have severe pain in your chest or abdomen.  Vomit repeatedly.  Have trouble breathing. Summary  Hypertension is when the force of blood  pumping through your arteries is too strong. If this condition is not controlled, it may put you at risk for serious complications.  Your personal target blood pressure may vary depending on your medical conditions, your age, and other factors. For most people, a normal blood pressure is less than 120/80.  Hypertension is treated with lifestyle changes, medicines, or a combination of both. Lifestyle changes include losing weight, eating a healthy, low-sodium diet, exercising more, and limiting alcohol. This information is not intended to replace advice given to you by your health care provider. Make sure you discuss any questions you have with your health care provider. Document Revised: 10/10/2017 Document Reviewed: 10/10/2017 Elsevier Patient Education  2021 [employer].

## 2020-07-05 DIAGNOSIS — Z23 Encounter for immunization: Secondary | ICD-10-CM | POA: Diagnosis not present

## 2020-07-14 ENCOUNTER — Ambulatory Visit: Payer: Medicare Other

## 2020-07-14 ENCOUNTER — Other Ambulatory Visit: Payer: Self-pay

## 2020-07-14 DIAGNOSIS — R0600 Dyspnea, unspecified: Secondary | ICD-10-CM

## 2020-07-14 DIAGNOSIS — R030 Elevated blood-pressure reading, without diagnosis of hypertension: Secondary | ICD-10-CM

## 2020-07-14 DIAGNOSIS — E781 Pure hyperglyceridemia: Secondary | ICD-10-CM

## 2020-07-14 DIAGNOSIS — Z86718 Personal history of other venous thrombosis and embolism: Secondary | ICD-10-CM

## 2020-07-14 DIAGNOSIS — Z7901 Long term (current) use of anticoagulants: Secondary | ICD-10-CM

## 2020-07-14 DIAGNOSIS — R0609 Other forms of dyspnea: Secondary | ICD-10-CM

## 2020-07-14 DIAGNOSIS — R972 Elevated prostate specific antigen [PSA]: Secondary | ICD-10-CM

## 2020-07-14 NOTE — Progress Notes (Addendum)
.   Chronic Care Management Pharmacy Note  07/14/2020 Name:  KYCE GING MRN:  [id number] DOB:  [birth year]  Subjective: Clarence Hopkins is an 87 y.o. year old male who is a primary patient of Plotnikov, Evie Lacks, MD.  The CCM team was consulted for assistance with disease management and care coordination needs.    Engaged with patient face to face for initial visit in response to provider referral for pharmacy case management and/or care coordination services.   Consent to Services:  The patient was given the following information about Chronic Care Management services today, agreed to services, and gave verbal consent: 1. CCM service includes personalized support from designated clinical staff supervised by the primary care provider, including individualized plan of care and coordination with other care providers 2. 24/7 contact phone numbers for assistance for urgent and routine care needs. 3. Service will only be billed when office clinical staff spend 20 minutes or more in a month to coordinate care. 4. Only one practitioner may furnish and bill the service in a calendar month. 5.The patient may stop CCM services at any time (effective at the end of the month) by phone call to the office staff. 6. The patient will be responsible for cost sharing (co-pay) of up to 20% of the service fee (after annual deductible is met). Patient agreed to services and consent obtained.  Patient Care Team: Plotnikov, Evie Lacks, MD as PCP - General (Internal Medicine) Shon Hough, MD as Consulting Physician (Ophthalmology) [name], MD as Consulting Physician (Dermatology) Oneita Kras, MD as Referring Physician (Endocrinology) [name], MD as Medical Oncologist (Oncology) Bern Fare, Darnelle Maffucci, [facility] (Pharmacist) Delice Bison Darnelle Maffucci, [facility] as Pharmacist (Pharmacist)  Recent office visits: No changes since last visit    Recent consult visits: No changes since last visit    Hospital  visits: None in previous 6 months  Objective:  Lab Results  Component Value Date   CREATININE 1.25 (H) 06/24/2020   BUN 18 06/24/2020   GFR 60.20 06/03/2019   GFRNONAA 56 (L) 06/24/2020   GFRAA >60 10/21/2019   NA 136 06/24/2020   K 4.6 06/24/2020   CALCIUM 9.6 06/24/2020   CO2 31 06/24/2020   GLUCOSE 84 06/24/2020    Lab Results  Component Value Date/Time   GFR 60.20 06/03/2019 03:40 PM   GFR 68.40 03/14/2019 07:53 AM    Last diabetic Eye exam:  No results found for: HMDIABEYEEXA  Last diabetic Foot exam:  No results found for: HMDIABFOOTEX   Lab Results  Component Value Date   CHOL 141 03/14/2019   HDL 38.00 (L) 03/14/2019   LDLCALC 88 03/14/2019   LDLDIRECT 116.0 03/31/2013   TRIG 74.0 03/14/2019   CHOLHDL 4 03/14/2019    Hepatic Function Latest Ref Rng & Units 06/24/2020 03/25/2020 12/22/2019  Total Protein 6.5 - 8.1 g/dL 6.6 6.9 6.8  Albumin 3.5 - 5.0 g/dL 3.9 4.1 3.9  AST 15 - 41 U/L _0 ALT 0 - 44 U/L _1 Alk Phosphatase 38 - 126 U/L 66 70 64  Total Bilirubin 0.3 - 1.2 mg/dL 0.7 0.6 0.6  Bilirubin, Direct 0.0 - 0.3 mg/dL - - -    Lab Results  Component Value Date/Time   TSH 0.67 06/03/2019 03:40 PM   TSH 1.06 03/14/2019 07:53 AM   FREET4 1.29 06/03/2019 03:40 PM    CBC Latest Ref Rng & Units 06/24/2020 03/25/2020 12/22/2019  WBC 4.0 - 10.5 K/uL 5.3  6.1 4.7  Hemoglobin 13.0 - 17.0 g/dL 13.1 13.3 13.3  Hematocrit 39.0 - 52.0 % 41.3 42.3 42.1  Platelets 150 - 400 K/uL 175 176 152    Lab Results  Component Value Date/Time   VD25OH 81.58 06/03/2019 03:40 PM   VD25OH 51.09 03/07/2018 03:07 PM    Clinical ASCVD: No  The ASCVD Risk score Mikey Bussing DC Jr., et al., 2013) failed to calculate for the following reasons:   The 2013 ASCVD risk score is only valid for ages 7 to 93    Depression screen PHQ 2/9 08/19/2019 03/10/2019 03/07/2018  Decreased Interest 0 0 0  Down, Depressed, Hopeless 0 0 0  PHQ - 2 Score 0 0 0  Altered sleeping - - -   Tired, decreased energy - - -  Change in appetite - - -  Feeling bad or failure about yourself  - - -  Trouble concentrating - - -  Moving slowly or fidgety/restless - - -  Suicidal thoughts - - -  PHQ-9 Score - - -  Difficult doing work/chores - - -  Some recent data might be hidden      Social History   Tobacco Use  Smoking Status Former Smoker   Packs/day: 0.75   Years: 41.00   Pack years: 30.75   Types: Cigarettes   Quit date: 02/13/1993   Years since quitting: 27.4  Smokeless Tobacco Never Used  Tobacco Comment   smoked 1956-1995, up to 1/2 ppd   BP Readings from Last 3 Encounters:  06/24/20 (!) 172/75  03/25/20 (!) 149/86  03/10/20 (!) 152/90   Pulse Readings from Last 3 Encounters:  06/24/20 (!) 58  03/25/20 (!) 58  03/10/20 76   Wt Readings from Last 3 Encounters:  06/24/20 207 lb (93.9 kg)  03/25/20 206 lb (93.4 kg)  03/10/20 203 lb (92.1 kg)   BMI Readings from Last 3 Encounters:  06/24/20 25.20 kg/m  03/25/20 25.08 kg/m  03/10/20 24.71 kg/m    Assessment/Interventions: Review of patient past medical history, allergies, medications, health status, including review of consultants reports, laboratory and other test data, was performed as part of comprehensive evaluation and provision of chronic care management services.   SDOH:  (Social Determinants of Health) assessments and interventions performed: Yes   SDOH Screenings   Alcohol Screen: Not on file  Depression (PHQ2-9): Low Risk    PHQ-2 Score: 0  Financial Resource Strain: Low Risk    Difficulty of Paying Living Expenses: Not hard at all  Food Insecurity: No Food Insecurity   Worried About Charity fundraiser in the Last Year: Never true   Ran Out of Food in the Last Year: Never true  Housing: Low Risk    Last Housing Risk Score: 0  Physical Activity: Sufficiently Active   Days of Exercise per Week: 5 days   Minutes of Exercise per Session: 30 min  Social Connections: Engineer, maintenance of Communication with Friends and Family: More than three times a week   Frequency of Social Gatherings with Friends and Family: More than three times a week   Attends Religious Services: More than 4 times per year   Active Member of [employer] or Organizations: No   Attends Music therapist: More than 4 times per year   Marital Status: Married  Stress: No Stress Concern Present   Feeling of Stress : Not at all  Tobacco Use: Medium Risk   Smoking Tobacco Use: Former Smoker  Smokeless Tobacco Use: Never Used  Transportation Needs: No Data processing manager (Medical): No   Lack of Transportation (Non-Medical): No    CCM Care Plan  No Known Allergies  Medications Reviewed Today     Reviewed by Tomasa Blase, [facility] (Pharmacist) on 06/30/20 at 1151  Med List Status: <None>   Medication Order Taking? Sig Documenting Provider Last Dose Status Informant  b complex vitamins tablet [id number] No Take 1 tablet by mouth daily.  Patient not taking: Reported on 06/30/2020   Plotnikov, Evie Lacks, MD Not Taking Active Self  Biotin w/ Vitamins C & E (HAIR/SKIN/NAILS) 1250-7.5-7.5 MCG-MG-UNT CHEW [id number] Yes Chew 2 tablets by mouth daily. [provider] Taking Active   Glycerin-Hypromellose-PEG 400 (DRY EYE RELIEF DROPS) 0.2-0.2-1 % SOLN [id number] Yes Place 1 drop into both eyes daily as needed (Dry eye). [provider] Taking Active Self  Ibuprofen-diphenhydrAMINE Cit (ADVIL PM PO) [id number] Yes Take by mouth at bedtime as needed. [provider] Taking Active   imiquimod (ALDARA) 5 % cream [id number] No SMARTSIG:1 Packet(s) Topical Every Night  Patient not taking: Reported on 06/30/2020   [provider] Not Taking Active   levothyroxine (SYNTHROID) 175 MCG tablet [id number] Yes Take 175 mcg by mouth daily before breakfast. [provider] Taking Active   mineral oil liquid [id number] Yes Take 15  mLs by mouth daily as needed for moderate constipation. [provider] Taking Active            Med Note Delice Bison, Darnelle Maffucci   Wed Jun 30, 2020 11:32 AM) Reports that he is unsure of the dose that he takes    tamsulosin (FLOMAX) 0.4 MG CAPS capsule [id number] Yes TAKE 1 CAPSULE BY MOUTH DAILY AFTER SUPPER  Patient taking differently: Take 0.4 mg by mouth at bedtime.   Plotnikov, Evie Lacks, MD Taking Active   VITAMIN D, CHOLECALCIFEROL, PO [id number] No Take by mouth.  Patient not taking: Reported on 06/30/2020   [provider] Not Taking Active   warfarin (COUMADIN) 5 MG tablet [id number] Yes TAKE 1 TABLET DAILY OR AS DIRECTED BY ANTICOAGULATION CLINIC  Patient taking differently: Take 5 mg by mouth daily.   Plotnikov, Evie Lacks, MD Taking Active             Patient Active Problem List   Diagnosis Date Noted   Right lower lobe lung mass    DOE (dyspnea on exertion) 06/03/2019   Near syncope 06/03/2019   Dysphagia 03/07/2018   Thyroid cancer ([place]) 02/10/2016   Medicare annual wellness visit, subsequent 02/02/2015   Hypertriglyceridemia 12/22/2013   Encounter for therapeutic drug monitoring 04/09/2013   History of basal cell cancer 10/30/2012   Long term (current) use of anticoagulants 05/23/2010   Protein C deficiency ([place]) 11/05/2009   Post-surgical hypothyroidism 01/13/2008   Elevated PSA measurement 01/13/2008   Melanoma ([place]) 01/13/2008   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   PULMONARY EMBOLISM, HX OF 11/09/2006    Immunization History  Administered Date(s) Administered   Influenza Split 11/13/2012   Influenza Whole 11/13/2009, 11/14/2011   Influenza, High Dose Seasonal PF 11/23/2015, 11/27/2016, 11/27/2017, 11/14/2018   Influenza-Unspecified 12/08/2013, 12/07/2014   PFIZER(Purple Top)SARS-COV-2 Vaccination 03/02/2019, 03/19/2019   Pneumococcal Conjugate-13 03/05/2017   Pneumococcal Polysaccharide-23 02/04/2016   Zoster, Live  03/14/2006    Conditions to be addressed/monitored:  Hypertension, Hypothyroidism, BPH and History of PE   Care Plan : Elevated BP, Hypothyroidism, BPH, and History of PE  Updates made by Tomasa Blase, RPH since 07/14/2020 12:00 AM     Problem: Elevated BP, hypothyroidism, BPH, and History of PE   Priority: High  Onset Date: 06/30/2020     Long-Range Goal: Disease Management   Start Date: 06/30/2020  Expected End Date: 09/30/2020  This Visit's Progress: On track  Recent Progress: On track  Priority: High  Note:   Care Plan and Follow Up Patient Decision:  Patient agrees to Care Plan and Follow-up.  Plan: Telephone follow up appointment with care management team member scheduled for:  2 weeks  and The patient has been provided with contact information for the care management team and has been advised to call with any health related questions or concerns.   Current Barriers:  Unable to independently monitor therapeutic efficacy Unable to maintain control of blood pressure recently   Pharmacist Clinical Goal(s):  Patient will achieve adherence to monitoring guidelines and medication adherence to achieve therapeutic efficacy achieve control of blood pressure / HR  as evidenced by blood pressure / HR logs  through collaboration with PharmD and provider.   Interventions: 1:1 collaboration with Plotnikov, Evie Lacks, MD regarding development and update of comprehensive plan of care as evidenced by provider attestation and co-signature Inter-disciplinary care team collaboration (see longitudinal plan of care) Comprehensive medication review performed; medication list updated in electronic medical record  Hypertension (BP goal <140/90) -Uncontrolled -Current treatment: N/a at this time - recently referred to cardiology 06/24/20 - as of 07/14/2020 - has not scheduled appointment to establish  -Medications previously tried: n/a   - ECHO from 06/19/2019 - normal  -Current home readings:  states that he has been checking blood pressure every 2 weeks - last was 142-144/75-76 -Current dietary habits: reports that he consumes sodium reduced diet as is  -Current exercise habits: reports that his activity is limited as he finds himself to be short of breath after minimal exertion - previously would use exercise bike at gym daily (3-4 miles) and golfed regularly  -Reports hypotensive/hypertensive symptoms -Educated on BP goals and benefits of medications for prevention of heart attack, stroke and kidney damage; Daily salt intake goal < 2300 mg; Importance of home blood pressure monitoring; -Counseled to monitor BP at home daily to every other day, document, and provide log at future appointments -Recommended for patient to start checking blood pressure more regularly - agreeable to check every other day - Educated patient on blood pressure goals - patient will reach out before next appointment should BP be elevated >150/90 - Discussed with patient about starting ACE/ARB which he declined at this time, wanted to start checking blood pressure more regularly before starting a new medication to ensure that it is necessary   Hypothyroidism (Goal: continued Control / Prevention of disease progression ) -Controlled -Current treatment  Levothyroxine 129mg daily  -Medications previously tried: armour thyroid 1233m -Recommended to continue current medication   BPH (Goal: Continued Control / Prevention of Disease progression) -Controlled -Current treatment  Tamsulosin 0.55m67maily  -Medications previously tried: n/a  -Recommended to continue current medication   History of PE (Goal: Prevention of Thrombus) -Controlled -Current treatment  Warfarin 5mg50mas directed by coumadin clinic  -Medications previously tried: n/a -Recommended to stop use of Advil PM Counseled on Risks of bleeding associated with NSAID (ibuprofen) use Recommended For patient to stop advil PM at this time, patient  is taking for sleep, recommended for patient to use unisom / benadryl as this is the active ingredient/related  to the active ingredient in the Advil PM that is helping with his sleep - recommended for patient to use APAP or voltaren gel if needed for pain   Health Maintenance -Current therapy:  Nature's Bounty Hair, Nail, and Skin chewable tablets - 2 tablets daily  Mineral oil - prn for constipation  Dry-eye relief drops  -Educated on Herbal supplement research is limited and benefits usually cannot be proven Cost vs benefit of each product must be carefully weighed by individual consumer -Patient is satisfied with current therapy and denies issues -Recommended to continue current medication   Patient Goals/Self-Care Activities Patient will:  - take medications as prescribed check blood pressure once daily to every other day , document, and provide at future appointments engage in dietary modifications by continuing reduced sodium diet   Follow Up Plan: Telephone follow up appointment with care management team member scheduled for: The patient has been provided with contact information for the care management team and has been advised to call with any health related questions or concerns.        Medication Assistance: None required.  Patient affirms current coverage meets needs.  Patient's preferred pharmacy is:  [id number] - [address], [place] [address] Phone: [phone] Fax: [phone]  [id number] [address] [id number]: [phone]: [phone]  Uses pill box? No - takes all of his medications in the morning Pt endorses 100% compliance  DTomasa Blase PharmD Clinical Pharmacist, LWahnetascreening  examination/treatment/procedure(s) were performed by non-physician practitioner and as supervising physician I was immediately available for consultation/collaboration.  I agree with above. ALew Dawes MD

## 2020-07-14 NOTE — Patient Instructions (Signed)
Visit Information  PATIENT GOALS: Goals Addressed            This Visit's Progress   . Track and Manage My Blood Pressure-Hypertension   Not on track    Timeframe:  Long-Range Goal Priority:  High Start Date:  06/30/2020                      Expected End Date: 12/31/2020                      Follow Up Date 07/14/2020    - check blood pressure daily or at least every other day  - Record in a blood pressure log so that results can be discussed with next appointment     Why is this important?    You won't feel high blood pressure, but it can still hurt your blood vessels.   High blood pressure can cause heart or kidney problems. It can also cause a stroke.   Making lifestyle changes like losing a little weight or eating less salt will help.   Checking your blood pressure at home and at different times of the day can help to control blood pressure.   If the doctor prescribes medicine remember to take it the way the doctor ordered.   Call the office if you cannot afford the medicine or if there are questions about it.         The patient verbalized understanding of instructions, educational materials, and care plan provided today and declined offer to receive copy of patient instructions, educational materials, and care plan.   Telephone follow up appointment with care management team member scheduled for: The patient has been provided with contact information for the care management team and has been advised to call with any health related questions or concerns.   Tomasa Blase, PharmD Clinical Pharmacist, [place]

## 2020-07-23 ENCOUNTER — Telehealth: Payer: Self-pay | Admitting: Internal Medicine

## 2020-07-23 DIAGNOSIS — R918 Other nonspecific abnormal finding of lung field: Secondary | ICD-10-CM | POA: Diagnosis not present

## 2020-07-23 DIAGNOSIS — R0989 Other specified symptoms and signs involving the circulatory and respiratory systems: Secondary | ICD-10-CM | POA: Diagnosis not present

## 2020-07-23 DIAGNOSIS — R059 Cough, unspecified: Secondary | ICD-10-CM | POA: Diagnosis not present

## 2020-07-23 DIAGNOSIS — Z20822 Contact with and (suspected) exposure to covid-19: Secondary | ICD-10-CM | POA: Diagnosis not present

## 2020-07-23 NOTE — Telephone Encounter (Signed)
Patient called and said that he has congestion. Informed him that the office does not have any appointments today. Advised him that he could go to urgent care or the emergency room. Patient requested that a message be sent to Dr. Alain Marion and his nurse. Please advise

## 2020-07-23 NOTE — Telephone Encounter (Signed)
Upon review of chart, pt went to urgent care today.

## 2020-08-02 ENCOUNTER — Ambulatory Visit (INDEPENDENT_AMBULATORY_CARE_PROVIDER_SITE_OTHER): Payer: Medicare Other | Admitting: General Practice

## 2020-08-02 ENCOUNTER — Telehealth: Payer: Self-pay | Admitting: *Deleted

## 2020-08-02 ENCOUNTER — Other Ambulatory Visit: Payer: Self-pay

## 2020-08-02 DIAGNOSIS — Z86718 Personal history of other venous thrombosis and embolism: Secondary | ICD-10-CM

## 2020-08-02 DIAGNOSIS — Z7901 Long term (current) use of anticoagulants: Secondary | ICD-10-CM

## 2020-08-02 LAB — POCT INR: INR: 1.6 — AB (ref 2.0–3.0)

## 2020-08-02 NOTE — Patient Instructions (Addendum)
Pre visit review using our clinic review tool, if applicable. No additional management support is needed unless otherwise documented below in the visit note.  Please take 1 1/2 tablets today and tomorrow (6/20 and 6/21) and then continue to take 1 (5 mg) tablet daily.  Re-check in 3 weeks.

## 2020-08-02 NOTE — Telephone Encounter (Signed)
Message received from patient to inform Dr. Marin Olp that he has had increased congestion, went to an urgent care, was diagnosed with pneumonia and would like to know if he needs to be seen sooner than his scheduled appts in September 2022.  Dr. Marin Olp notified.  Call placed back to patient's wife and patient's wife notified per order of Dr. Marin Olp that pt does not need to be seen sooner than his next appt in 10/2020 and to f/u with PCP regarding congestion and pneumonia. Pt.'s wife appreciative of call back and has no questions at this time.

## 2020-08-04 ENCOUNTER — Other Ambulatory Visit: Payer: Self-pay

## 2020-08-04 ENCOUNTER — Encounter: Payer: Self-pay | Admitting: Family Medicine

## 2020-08-04 ENCOUNTER — Ambulatory Visit (INDEPENDENT_AMBULATORY_CARE_PROVIDER_SITE_OTHER): Payer: Medicare Other | Admitting: Family Medicine

## 2020-08-04 ENCOUNTER — Telehealth: Payer: Medicare Other

## 2020-08-04 VITALS — BP 132/80 | HR 65 | Temp 98.0°F | Wt 203.0 lb

## 2020-08-04 DIAGNOSIS — R918 Other nonspecific abnormal finding of lung field: Secondary | ICD-10-CM

## 2020-08-04 DIAGNOSIS — J189 Pneumonia, unspecified organism: Secondary | ICD-10-CM | POA: Diagnosis not present

## 2020-08-04 DIAGNOSIS — Z86718 Personal history of other venous thrombosis and embolism: Secondary | ICD-10-CM

## 2020-08-04 NOTE — Progress Notes (Signed)
   Subjective:    Patient ID: Clarence Hopkins, male    DOB: Jun 20, 1930, 87 y.o.   MRN: [id number]  HPI Here to follow up on an urgent care visit on 07-23-20. Prior to that he had a fever and a non-productive cough for 2 days. No chest pain or SOB. 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 Marin Olp showed these to be stable. At the urgent care he tested negative for Covid-19 and for influenza. A CXR showed bilateral linear ground glass opacities that may have been due to an early multifocal pneumonia. He was given a 7 day course of Doxycycline, which he has completed. He now feels back to normal. No more cough or fever.    Review of Systems  Constitutional:  Positive for fatigue. Negative for chills, diaphoresis and fever.  Respiratory: Negative.    Cardiovascular: Negative.       Objective:   Physical Exam Constitutional:      Appearance: Normal appearance.  Cardiovascular:     Rate and Rhythm: Normal rate and regular rhythm.     Pulses: Normal pulses.     Heart sounds: Normal heart sounds.  Pulmonary:     Effort: Pulmonary effort is normal. No respiratory distress.     Breath sounds: Normal breath sounds. No stridor. No rhonchi or rales.  Neurological:     Mental Status: He is alert.          Assessment & Plan:  He seems to have recovered from a recent mild pneumonia. He is scheduled to see Dr. Marin Olp in September. Otherwise he will recheck as needed. We spent 35 minutes today reviewing his records and discussing these issues . Alysia Penna, MD

## 2020-08-05 ENCOUNTER — Other Ambulatory Visit: Payer: Self-pay | Admitting: Internal Medicine

## 2020-08-05 DIAGNOSIS — Z85820 Personal history of malignant melanoma of skin: Secondary | ICD-10-CM | POA: Diagnosis not present

## 2020-08-05 DIAGNOSIS — Z85828 Personal history of other malignant neoplasm of skin: Secondary | ICD-10-CM | POA: Diagnosis not present

## 2020-08-05 DIAGNOSIS — C44212 Basal cell carcinoma of skin of right ear and external auricular canal: Secondary | ICD-10-CM | POA: Diagnosis not present

## 2020-08-06 ENCOUNTER — Other Ambulatory Visit: Payer: Self-pay

## 2020-08-06 ENCOUNTER — Ambulatory Visit (INDEPENDENT_AMBULATORY_CARE_PROVIDER_SITE_OTHER): Payer: Medicare Other

## 2020-08-06 DIAGNOSIS — Z86718 Personal history of other venous thrombosis and embolism: Secondary | ICD-10-CM

## 2020-08-06 DIAGNOSIS — R030 Elevated blood-pressure reading, without diagnosis of hypertension: Secondary | ICD-10-CM

## 2020-08-06 DIAGNOSIS — E781 Pure hyperglyceridemia: Secondary | ICD-10-CM

## 2020-08-06 DIAGNOSIS — Z7901 Long term (current) use of anticoagulants: Secondary | ICD-10-CM

## 2020-08-06 DIAGNOSIS — Z5181 Encounter for therapeutic drug level monitoring: Secondary | ICD-10-CM

## 2020-08-06 NOTE — Progress Notes (Addendum)
Chronic Care Management Pharmacy Note  08/06/2020 Name:  Clarence Hopkins MRN:  [id number] DOB:  29-Feb-1930  Summary: - Patient feeling better, recovering from mild pneumonia that he had earlier this month - BP with most recent office visit at goal (132/80), patient has not been checking blood pressure at home, is agreeable to start checking blood pressure over the next week  Recommendations/Changes made from today's visit: - No changes to medications at this time, patient will check blood pressure daily for the next week and results will be discussed with next follow up in a week   Subjective: Clarence Hopkins is an 87 y.o. year old male who is a primary patient of Plotnikov, Evie Lacks, MD.  The CCM team was consulted for assistance with disease management and care coordination needs.    Engaged with patient by telephone for follow up visit in response to provider referral for pharmacy case management and/or care coordination services.   Consent to Services:  The patient was given the following information about Chronic Care Management services today, agreed to services, and gave verbal consent: 1. CCM service includes personalized support from designated clinical staff supervised by the primary care provider, including individualized plan of care and coordination with other care providers 2. 24/7 contact phone numbers for assistance for urgent and routine care needs. 3. Service will only be billed when office clinical staff spend 20 minutes or more in a month to coordinate care. 4. Only one practitioner may furnish and bill the service in a calendar month. 5.The patient may stop CCM services at any time (effective at the end of the month) by phone call to the office staff. 6. The patient will be responsible for cost sharing (co-pay) of up to 20% of the service fee (after annual deductible is met). Patient agreed to services and consent obtained.  Patient Care Team: Plotnikov, Evie Lacks, MD as PCP -  General (Internal Medicine) Shon Hough, MD as Consulting Physician (Ophthalmology) [name], MD as Consulting Physician (Dermatology) Oneita Kras, MD as Referring Physician (Endocrinology) [name], MD as Medical Oncologist (Oncology) Delice Bison Darnelle Maffucci, [facility] (Pharmacist) Tomasa Blase, [facility] as Pharmacist (Pharmacist)  Recent office visits: 08/04/2020 - Dr. Sarajane Jews - follow up for recent mild pneumonia - no changes at this time   Recent consult visits: 07/23/2020 - Urgent care for 1 day history of cough and congestion - suspected mild pneumonia - prescribed doxycycline for 7 days   Hospital visits: None in previous 6 months  Objective:  Lab Results  Component Value Date   CREATININE 1.25 (H) 06/24/2020   BUN 18 06/24/2020   GFR 60.20 06/03/2019   GFRNONAA 56 (L) 06/24/2020   GFRAA >60 10/21/2019   NA 136 06/24/2020   K 4.6 06/24/2020   CALCIUM 9.6 06/24/2020   CO2 31 06/24/2020   GLUCOSE 84 06/24/2020    Lab Results  Component Value Date/Time   GFR 60.20 06/03/2019 03:40 PM   GFR 68.40 03/14/2019 07:53 AM    Last diabetic Eye exam:  No results found for: HMDIABEYEEXA  Last diabetic Foot exam:  No results found for: HMDIABFOOTEX   Lab Results  Component Value Date   CHOL 141 03/14/2019   HDL 38.00 (L) 03/14/2019   LDLCALC 88 03/14/2019   LDLDIRECT 116.0 03/31/2013   TRIG 74.0 03/14/2019   CHOLHDL 4 03/14/2019    Hepatic Function Latest Ref Rng & Units 06/24/2020 03/25/2020 12/22/2019  Total Protein 6.5 - 8.1 g/dL 6.6  6.9 6.8  Albumin 3.5 - 5.0 g/dL 3.9 4.1 3.9  AST 15 - 41 U/L '18 24 19  ' ALT 0 - 44 U/L '13 18 13  ' Alk Phosphatase 38 - 126 U/L 66 70 64  Total Bilirubin 0.3 - 1.2 mg/dL 0.7 0.6 0.6  Bilirubin, Direct 0.0 - 0.3 mg/dL - - -    Lab Results  Component Value Date/Time   TSH 0.67 06/03/2019 03:40 PM   TSH 1.06 03/14/2019 07:53 AM   FREET4 1.29 06/03/2019 03:40 PM    CBC Latest Ref Rng & Units 06/24/2020 03/25/2020 12/22/2019   WBC 4.0 - 10.5 K/uL 5.3 6.1 4.7  Hemoglobin 13.0 - 17.0 g/dL 13.1 13.3 13.3  Hematocrit 39.0 - 52.0 % 41.3 42.3 42.1  Platelets 150 - 400 K/uL 175 176 152    Lab Results  Component Value Date/Time   VD25OH 81.58 06/03/2019 03:40 PM   VD25OH 51.09 03/07/2018 03:07 PM    Clinical ASCVD: No  The ASCVD Risk score Mikey Bussing DC Jr., et al., 2013) failed to calculate for the following reasons:   The 2013 ASCVD risk score is only valid for ages 66 to 88    Depression screen PHQ 2/9 08/19/2019 03/10/2019 03/07/2018  Decreased Interest 0 0 0  Down, Depressed, Hopeless 0 0 0  PHQ - 2 Score 0 0 0  Altered sleeping - - -  Tired, decreased energy - - -  Change in appetite - - -  Feeling bad or failure about yourself  - - -  Trouble concentrating - - -  Moving slowly or fidgety/restless - - -  Suicidal thoughts - - -  PHQ-9 Score - - -  Difficult doing work/chores - - -  Some recent data might be hidden    Social History   Tobacco Use  Smoking Status Former   Packs/day: 0.75   Years: 41.00   Pack years: 30.75   Types: Cigarettes   Quit date: 02/13/1993   Years since quitting: 27.4  Smokeless Tobacco Never  Tobacco Comments   smoked 1956-1995, up to 1/2 ppd   BP Readings from Last 3 Encounters:  08/04/20 132/80  06/24/20 (!) 172/75  03/25/20 (!) 149/86   Pulse Readings from Last 3 Encounters:  08/04/20 65  06/24/20 (!) 58  03/25/20 (!) 58   Wt Readings from Last 3 Encounters:  08/04/20 203 lb (92.1 kg)  06/24/20 207 lb (93.9 kg)  03/25/20 206 lb (93.4 kg)   BMI Readings from Last 3 Encounters:  08/04/20 24.71 kg/m  06/24/20 25.20 kg/m  03/25/20 25.08 kg/m    Assessment/Interventions: Review of patient past medical history, allergies, medications, health status, including review of consultants reports, laboratory and other test data, was performed as part of comprehensive evaluation and provision of chronic care management services.   SDOH:  (Social Determinants of  Health) assessments and interventions performed: Yes  SDOH Screenings   Alcohol Screen: Not on file  Depression (PHQ2-9): Low Risk    PHQ-2 Score: 0  Financial Resource Strain: Low Risk    Difficulty of Paying Living Expenses: Not hard at all  Food Insecurity: No Food Insecurity   Worried About Charity fundraiser in the Last Year: Never true   Ran Out of Food in the Last Year: Never true  Housing: Low Risk    Last Housing Risk Score: 0  Physical Activity: Sufficiently Active   Days of Exercise per Week: 5 days   Minutes of Exercise per Session: 30 min  Social Connections: Engineer, building services of Communication with Friends and Family: More than three times a week   Frequency of Social Gatherings with Friends and Family: More than three times a week   Attends Religious Services: More than 4 times per year   Active Member of [employer] or Organizations: No   Attends Music therapist: More than 4 times per year   Marital Status: Married  Stress: No Stress Concern Present   Feeling of Stress : Not at all  Tobacco Use: Medium Risk   Smoking Tobacco Use: Former   Smokeless Tobacco Use: Never  Transportation Needs: No Data processing manager (Medical): No   Lack of Transportation (Non-Medical): No    CCM Care Plan  No Known Allergies  Medications Reviewed Today     Reviewed by Laurey Morale, MD (Physician) on 08/04/20 at 91  Med List Status: <None>   Medication Order Taking? Sig Documenting Provider Last Dose Status Informant  b complex vitamins tablet [id number] No Take 1 tablet by mouth daily.  Patient not taking: No sig reported   Plotnikov, Evie Lacks, MD Not Taking Active   Biotin w/ Vitamins C & E (HAIR/SKIN/NAILS) 1250-7.5-7.5 MCG-MG-UNT CHEW [id number] Yes Chew 2 tablets by mouth daily. [provider] Taking Active   Glycerin-Hypromellose-PEG 400 (DRY EYE RELIEF DROPS) 0.2-0.2-1 % SOLN [id number] Yes Place 1 drop into  both eyes daily as needed (Dry eye). [provider] Taking Active Self  Ibuprofen-diphenhydrAMINE Cit (ADVIL PM PO) [id number] Yes Take by mouth at bedtime as needed. [provider] Taking Active   imiquimod (ALDARA) 5 % cream [id number] No SMARTSIG:1 Packet(s) Topical Every Night  Patient not taking: No sig reported   [provider] Not Taking Active   levothyroxine (SYNTHROID) 175 MCG tablet [id number] Yes Take 175 mcg by mouth daily before breakfast. [provider] Taking Active   mineral oil liquid [id number] Yes Take 15 mLs by mouth daily as needed for moderate constipation. [provider] Taking Active            Med Note Delice Bison, Darnelle Maffucci   Wed Jun 30, 2020 11:32 AM) Reports that he is unsure of the dose that he takes    tamsulosin (FLOMAX) 0.4 MG CAPS capsule [id number] Yes TAKE 1 CAPSULE BY MOUTH DAILY AFTER SUPPER  Patient taking differently: Take 0.4 mg by mouth at bedtime.   Plotnikov, Evie Lacks, MD Taking Active   VITAMIN D, CHOLECALCIFEROL, PO [id number] No Take by mouth.  Patient not taking: No sig reported   [provider] Not Taking Active   warfarin (COUMADIN) 5 MG tablet [id number] Yes TAKE 1 TABLET DAILY OR AS DIRECTED BY ANTICOAGULATION CLINIC  Patient taking differently: Take 5 mg by mouth daily.   Plotnikov, Evie Lacks, MD Taking Active             Patient Active Problem List   Diagnosis Date Noted   Multifocal pneumonia 08/04/2020   Right lower lobe lung mass    DOE (dyspnea on exertion) 06/03/2019   Near syncope 06/03/2019   Dysphagia 03/07/2018   Thyroid cancer ([place]) 02/10/2016   Medicare annual wellness visit, subsequent 02/02/2015   Hypertriglyceridemia 12/22/2013   Encounter for therapeutic drug monitoring 04/09/2013   History of basal cell cancer 10/30/2012   Long term (current) use of anticoagulants 05/23/2010   Protein C deficiency ([place]) 11/05/2009   Post-surgical hypothyroidism 01/13/2008    Elevated PSA measurement 01/13/2008  Melanoma ([place]) 01/13/2008   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   PULMONARY EMBOLISM, HX OF 11/09/2006    Immunization History  Administered Date(s) Administered   Influenza Split 11/13/2012   Influenza Whole 11/13/2009, 11/14/2011   Influenza, High Dose Seasonal PF 11/23/2015, 11/27/2016, 11/27/2017, 11/14/2018   Influenza-Unspecified 12/08/2013, 12/07/2014   PFIZER(Purple Top)SARS-COV-2 Vaccination 03/02/2019, 03/19/2019   Pneumococcal Conjugate-13 03/05/2017   Pneumococcal Polysaccharide-23 02/04/2016   Zoster, Live 03/14/2006    Conditions to be addressed/monitored:  Hypertension, Hypothyroidism, BPH, and History of PE   Care Plan : Elevated BP, Hypothyroidism, BPH, and History of PE  Updates made by Tomasa Blase, [place] since 08/06/2020 12:00 AM     Problem: Elevated BP, hypothyroidism, BPH, and History of PE   Priority: High  Onset Date: 06/30/2020     Long-Range Goal: Disease Management   Start Date: 06/30/2020  Expected End Date: 09/30/2020  This Visit's Progress: Not on track  Recent Progress: On track  Priority: High  Note:   Care Plan and Follow Up Patient Decision:  Patient agrees to Care Plan and Follow-up.  Plan: Telephone follow up appointment with care management team member scheduled for:  1 week  and The patient has been provided with contact information for the care management team and has been advised to call with any health related questions or concerns.   Current Barriers:  Unable to independently monitor therapeutic efficacy Unable to maintain control of blood pressure recently   Pharmacist Clinical Goal(s):  Patient will achieve adherence to monitoring guidelines and medication adherence to achieve therapeutic efficacy achieve control of blood pressure / HR  as evidenced by blood pressure / HR logs  through collaboration with PharmD and provider.   Interventions: 1:1 collaboration with  Plotnikov, Evie Lacks, MD regarding development and update of comprehensive plan of care as evidenced by provider attestation and co-signature Inter-disciplinary care team collaboration (see longitudinal plan of care) Comprehensive medication review performed; medication list updated in electronic medical record  Hypertension (BP goal <140/90) -Uncontrolled -Current treatment: N/a at this time - recently referred to cardiology 06/24/20 - as of 08/06/2020 - has not scheduled appointment to establish  -Medications previously tried: n/a   - ECHO from 06/19/2019 - normal  -Current home readings: states that he has been checking blood pressure every 2 weeks - last was 142-144/75-76 -Current dietary habits: reports that he consumes sodium reduced diet as is  -Current exercise habits: reports that his activity is limited as he finds himself to be short of breath after minimal exertion - previously would use exercise bike at gym daily (3-4 miles) and golfed regularly  -Reports hypotensive/hypertensive symptoms -Educated on BP goals and benefits of medications for prevention of heart attack, stroke and kidney damage; Daily salt intake goal < 2300 mg; Importance of home blood pressure monitoring; -Counseled to monitor BP at home daily to every other day, document, and provide log at future appointments -Recommended for patient to start checking blood pressure more regularly - agreeable to check every other day - Educated patient on blood pressure goals - patient will reach out before next appointment should BP be elevated >150/90 - Patient reports that he has not been checking blood pressure recently, could not recall last home BP check - is agreeable to start checking BP more regularly - at least every other day   Hypothyroidism (Goal: continued Control / Prevention of disease progression ) -Controlled -Current treatment  Levothyroxine 119mg daily  -Medications previously tried: armour thyroid  139m   -Recommended to continue current medication   BPH (Goal: Continued Control / Prevention of Disease progression) -Controlled -Current treatment  Tamsulosin 0.44105mdaily  -Medications previously tried: n/a  -Recommended to continue current medication   History of PE (Goal: Prevention of Thrombus) -Controlled -Current treatment  Warfarin 105m33m as directed by coumadin clinic  -Medications previously tried: n/a -Recommended to stop use of Advil PM Counseled on Risks of bleeding associated with NSAID (ibuprofen) use Recommended For patient to stop advil PM at this time, patient is taking for sleep, recommended for patient to use unisom / benadryl as this is the active ingredient/related to the active ingredient in the Advil PM that is helping with his sleep - recommended for patient to use APAP or voltaren gel if needed for pain   Health Maintenance -Current therapy:  Nature's Bounty Hair, Nail, and Skin chewable tablets - 2 tablets daily  Mineral oil - prn for constipation  Dry-eye relief drops  -Educated on Herbal supplement research is limited and benefits usually cannot be proven Cost vs benefit of each product must be carefully weighed by individual consumer -Patient is satisfied with current therapy and denies issues -Recommended to continue current medication   Patient Goals/Self-Care Activities Patient will:  - take medications as prescribed check blood pressure once daily to every other day , document, and provide at future appointments engage in dietary modifications by continuing reduced sodium diet   Follow Up Plan: Telephone follow up appointment with care management team member scheduled for: 1 week  The patient has been provided with contact information for the care management team and has been advised to call with any health related questions or concerns.        Medication Assistance: None required.  Patient affirms current coverage meets  needs.  Compliance/Adherence/Medication fill history: Care Gaps: Tetanus and Shingles vaccines   [facility]  [id number] [id number]: [phone]: [phone]/pharmacy #[id number] Gary -[place] [address] [place]  [id number]: [phone]: [phone] pill box? No - takes all of his medications in the morning  Pt endorses 100% compliance  Care Plan and Follow Up Patient Decision:  Patient agrees to Care Plan and Follow-up.  Plan: Telephone follow up appointment with care management team member scheduled for:  1 week and The patient has been provided with contact information for the care management team and has been advised to call with any health related questions or concerns.   DaniTomasa BlasearmD Clinical Pharmacist, [place]ening examination/treatment/procedure(s) were performed by non-physician practitioner and as supervising physician I was immediately available for consultation/collaboration.  I agree with above. AlekLew Dawes

## 2020-08-06 NOTE — Patient Instructions (Signed)
Visit Information  PATIENT GOALS:  Goals Addressed             This Visit's Progress    Track and Manage My Blood Pressure-Hypertension   Not on track    Timeframe:  Long-Range Goal Priority:  High Start Date:  06/30/2020                      Expected End Date: 12/31/2020                      Follow Up Date 07/14/2020    - check blood pressure daily or at least every other day  - Record in a blood pressure log so that results can be discussed with next appointment     Why is this important?   You won't feel high blood pressure, but it can still hurt your blood vessels.  High blood pressure can cause heart or kidney problems. It can also cause a stroke.  Making lifestyle changes like losing a little weight or eating less salt will help.  Checking your blood pressure at home and at different times of the day can help to control blood pressure.  If the doctor prescribes medicine remember to take it the way the doctor ordered.  Call the office if you cannot afford the medicine or if there are questions about it.            The patient verbalized understanding of instructions, educational materials, and care plan provided today and declined offer to receive copy of patient instructions, educational materials, and care plan.   Telephone follow up appointment with care management team member scheduled for: 1 week The patient has been provided with contact information for the care management team and has been advised to call with any health related questions or concerns.   Tomasa Blase, PharmD Clinical Pharmacist, [place]

## 2020-08-11 DIAGNOSIS — E890 Postprocedural hypothyroidism: Secondary | ICD-10-CM | POA: Diagnosis not present

## 2020-08-12 ENCOUNTER — Other Ambulatory Visit: Payer: Self-pay

## 2020-08-12 ENCOUNTER — Ambulatory Visit: Payer: Medicare Other

## 2020-08-12 DIAGNOSIS — R030 Elevated blood-pressure reading, without diagnosis of hypertension: Secondary | ICD-10-CM

## 2020-08-12 DIAGNOSIS — E781 Pure hyperglyceridemia: Secondary | ICD-10-CM

## 2020-08-12 NOTE — Patient Instructions (Signed)
Visit Information  PATIENT GOALS:  Goals Addressed             This Visit's Progress    Track and Manage My Blood Pressure-Hypertension   Not on track    Timeframe:  Long-Range Goal Priority:  High Start Date:  06/30/2020                      Expected End Date: 12/31/2020                      Follow Up Date 07/14/2020    - check blood pressure  at least once weekly  - Record in a blood pressure log so that results can be discussed with next appointment     Why is this important?   You won't feel high blood pressure, but it can still hurt your blood vessels.  High blood pressure can cause heart or kidney problems. It can also cause a stroke.  Making lifestyle changes like losing a little weight or eating less salt will help.  Checking your blood pressure at home and at different times of the day can help to control blood pressure.  If the doctor prescribes medicine remember to take it the way the doctor ordered.  Call the office if you cannot afford the medicine or if there are questions about it.            The patient verbalized understanding of instructions, educational materials, and care plan provided today and declined offer to receive copy of patient instructions, educational materials, and care plan.   Telephone follow up appointment with care management team member scheduled for: 8 weeks  The patient has been provided with contact information for the care management team and has been advised to call with any health related questions or concerns.   Tomasa Blase, PharmD Clinical Pharmacist, [place]

## 2020-08-12 NOTE — Progress Notes (Addendum)
Chronic Care Management Pharmacy Note  08/12/2020 Name:  Clarence Hopkins MRN:  [id number] DOB:  29-Dec-1930  Summary: - Patient reports that he has not yet started checking blood pressures - reports that he was in to see Dr. Hartford Poli yesterday -reports that blood pressure in office was 127/78  Recommendations/Changes made from today's visit: - No changes to medications at this time, patient will check blood pressure at least once weekly until next follow up (8 weeks) - patient to reach out to office should blood pressure be >130/80   Subjective: Clarence Hopkins is an 87 y.o. year old male who is a primary patient of Plotnikov, Evie Lacks, MD.  The CCM team was consulted for assistance with disease management and care coordination needs.    Engaged with patient by telephone for follow up visit in response to provider referral for pharmacy case management and/or care coordination services.   Consent to Services:  The patient was given the following information about Chronic Care Management services today, agreed to services, and gave verbal consent: 1. CCM service includes personalized support from designated clinical staff supervised by the primary care provider, including individualized plan of care and coordination with other care providers 2. 24/7 contact phone numbers for assistance for urgent and routine care needs. 3. Service will only be billed when office clinical staff spend 20 minutes or more in a month to coordinate care. 4. Only one practitioner may furnish and bill the service in a calendar month. 5.The patient may stop CCM services at any time (effective at the end of the month) by phone call to the office staff. 6. The patient will be responsible for cost sharing (co-pay) of up to 20% of the service fee (after annual deductible is met). Patient agreed to services and consent obtained.  Patient Care Team: Plotnikov, Evie Lacks, MD as PCP - General (Internal Medicine) Shon Hough, MD as  Consulting Physician (Ophthalmology) [name], MD as Consulting Physician (Dermatology) Oneita Kras, MD as Referring Physician (Endocrinology) [name], MD as Medical Oncologist (Oncology) Delice Bison Darnelle Maffucci, [facility] (Pharmacist) Tomasa Blase, [facility] as Pharmacist (Pharmacist)  Recent office visits: 08/04/2020 - Dr. Sarajane Jews - follow up for recent mild pneumonia - no changes at this time   Recent consult visits: 07/23/2020 - Urgent care for 1 day history of cough and congestion - suspected mild pneumonia - prescribed doxycycline for 7 days   Hospital visits: None in previous 6 months  Objective:  Lab Results  Component Value Date   CREATININE 1.25 (H) 06/24/2020   BUN 18 06/24/2020   GFR 60.20 06/03/2019   GFRNONAA 56 (L) 06/24/2020   GFRAA >60 10/21/2019   NA 136 06/24/2020   K 4.6 06/24/2020   CALCIUM 9.6 06/24/2020   CO2 31 06/24/2020   GLUCOSE 84 06/24/2020    Lab Results  Component Value Date/Time   GFR 60.20 06/03/2019 03:40 PM   GFR 68.40 03/14/2019 07:53 AM    Last diabetic Eye exam:  No results found for: HMDIABEYEEXA  Last diabetic Foot exam:  No results found for: HMDIABFOOTEX   Lab Results  Component Value Date   CHOL 141 03/14/2019   HDL 38.00 (L) 03/14/2019   LDLCALC 88 03/14/2019   LDLDIRECT 116.0 03/31/2013   TRIG 74.0 03/14/2019   CHOLHDL 4 03/14/2019    Hepatic Function Latest Ref Rng & Units 06/24/2020 03/25/2020 12/22/2019  Total Protein 6.5 - 8.1 g/dL 6.6 6.9 6.8  Albumin 3.5 - 5.0 g/dL  3.9 4.1 3.9  AST 15 - 41 U/L '18 24 19  ' ALT 0 - 44 U/L '13 18 13  ' Alk Phosphatase 38 - 126 U/L 66 70 64  Total Bilirubin 0.3 - 1.2 mg/dL 0.7 0.6 0.6  Bilirubin, Direct 0.0 - 0.3 mg/dL - - -    Lab Results  Component Value Date/Time   TSH 0.67 06/03/2019 03:40 PM   TSH 1.06 03/14/2019 07:53 AM   FREET4 1.29 06/03/2019 03:40 PM    CBC Latest Ref Rng & Units 06/24/2020 03/25/2020 12/22/2019  WBC 4.0 - 10.5 K/uL 5.3 6.1 4.7  Hemoglobin 13.0 -  17.0 g/dL 13.1 13.3 13.3  Hematocrit 39.0 - 52.0 % 41.3 42.3 42.1  Platelets 150 - 400 K/uL 175 176 152    Lab Results  Component Value Date/Time   VD25OH 81.58 06/03/2019 03:40 PM   VD25OH 51.09 03/07/2018 03:07 PM    Clinical ASCVD: No  The ASCVD Risk score Mikey Bussing DC Jr., et al., 2013) failed to calculate for the following reasons:   The 2013 ASCVD risk score is only valid for ages 70 to 77    Depression screen PHQ 2/9 08/19/2019 03/10/2019 03/07/2018  Decreased Interest 0 0 0  Down, Depressed, Hopeless 0 0 0  PHQ - 2 Score 0 0 0  Altered sleeping - - -  Tired, decreased energy - - -  Change in appetite - - -  Feeling bad or failure about yourself  - - -  Trouble concentrating - - -  Moving slowly or fidgety/restless - - -  Suicidal thoughts - - -  PHQ-9 Score - - -  Difficult doing work/chores - - -  Some recent data might be hidden    Social History   Tobacco Use  Smoking Status Former   Packs/day: 0.75   Years: 41.00   Pack years: 30.75   Types: Cigarettes   Quit date: 02/13/1993   Years since quitting: 27.5  Smokeless Tobacco Never  Tobacco Comments   smoked 1956-1995, up to 1/2 ppd   BP Readings from Last 3 Encounters:  08/04/20 132/80  06/24/20 (!) 172/75  03/25/20 (!) 149/86   Pulse Readings from Last 3 Encounters:  08/04/20 65  06/24/20 (!) 58  03/25/20 (!) 58   Wt Readings from Last 3 Encounters:  08/04/20 203 lb (92.1 kg)  06/24/20 207 lb (93.9 kg)  03/25/20 206 lb (93.4 kg)   BMI Readings from Last 3 Encounters:  08/04/20 24.71 kg/m  06/24/20 25.20 kg/m  03/25/20 25.08 kg/m    Assessment/Interventions: Review of patient past medical history, allergies, medications, health status, including review of consultants reports, laboratory and other test data, was performed as part of comprehensive evaluation and provision of chronic care management services.   SDOH:  (Social Determinants of Health) assessments and interventions performed:  Yes  SDOH Screenings   Alcohol Screen: Not on file  Depression (PHQ2-9): Low Risk    PHQ-2 Score: 0  Financial Resource Strain: Low Risk    Difficulty of Paying Living Expenses: Not hard at all  Food Insecurity: No Food Insecurity   Worried About Charity fundraiser in the Last Year: Never true   Ran Out of Food in the Last Year: Never true  Housing: Low Risk    Last Housing Risk Score: 0  Physical Activity: Sufficiently Active   Days of Exercise per Week: 5 days   Minutes of Exercise per Session: 30 min  Social Connections: Socially Integrated   Frequency  of Communication with Friends and Family: More than three times a week   Frequency of Social Gatherings with Friends and Family: More than three times a week   Attends Religious Services: More than 4 times per year   Active Member of [employer] or Organizations: No   Attends Music therapist: More than 4 times per year   Marital Status: Married  Stress: No Stress Concern Present   Feeling of Stress : Not at all  Tobacco Use: Medium Risk   Smoking Tobacco Use: Former   Smokeless Tobacco Use: Never  Transportation Needs: No Data processing manager (Medical): No   Lack of Transportation (Non-Medical): No    CCM Care Plan  No Known Allergies  Medications Reviewed Today     Reviewed by Laurey Morale, MD (Physician) on 08/04/20 at 79  Med List Status: <None>   Medication Order Taking? Sig Documenting Provider Last Dose Status Informant  b complex vitamins tablet [id number] No Take 1 tablet by mouth daily.  Patient not taking: No sig reported   Plotnikov, Evie Lacks, MD Not Taking Active   Biotin w/ Vitamins C & E (HAIR/SKIN/NAILS) 1250-7.5-7.5 MCG-MG-UNT CHEW [id number] Yes Chew 2 tablets by mouth daily. [provider] Taking Active   Glycerin-Hypromellose-PEG 400 (DRY EYE RELIEF DROPS) 0.2-0.2-1 % SOLN [id number] Yes Place 1 drop into both eyes daily as needed (Dry eye). [provider] Taking Active Self  Ibuprofen-diphenhydrAMINE Cit (ADVIL PM PO) [id number] Yes Take by mouth at bedtime as needed. [provider] Taking Active   imiquimod (ALDARA) 5 % cream [id number] No SMARTSIG:1 Packet(s) Topical Every Night  Patient not taking: No sig reported   [provider] Not Taking Active   levothyroxine (SYNTHROID) 175 MCG tablet [id number] Yes Take 175 mcg by mouth daily before breakfast. [provider] Taking Active   mineral oil liquid [id number] Yes Take 15 mLs by mouth daily as needed for moderate constipation. [provider] Taking Active            Med Note Delice Bison, Darnelle Maffucci   Wed Jun 30, 2020 11:32 AM) Reports that he is unsure of the dose that he takes    tamsulosin (FLOMAX) 0.4 MG CAPS capsule [id number] Yes TAKE 1 CAPSULE BY MOUTH DAILY AFTER SUPPER  Patient taking differently: Take 0.4 mg by mouth at bedtime.   Plotnikov, Evie Lacks, MD Taking Active   VITAMIN D, CHOLECALCIFEROL, PO [id number] No Take by mouth.  Patient not taking: No sig reported   [provider] Not Taking Active   warfarin (COUMADIN) 5 MG tablet [id number] Yes TAKE 1 TABLET DAILY OR AS DIRECTED BY ANTICOAGULATION CLINIC  Patient taking differently: Take 5 mg by mouth daily.   Plotnikov, Evie Lacks, MD Taking Active             Patient Active Problem List   Diagnosis Date Noted   Multifocal pneumonia 08/04/2020   Right lower lobe lung mass    DOE (dyspnea on exertion) 06/03/2019   Near syncope 06/03/2019   Dysphagia 03/07/2018   Thyroid cancer ([place]) 02/10/2016   Medicare annual wellness visit, subsequent 02/02/2015   Hypertriglyceridemia 12/22/2013   Encounter for therapeutic drug monitoring 04/09/2013   History of basal cell cancer 10/30/2012   Long term (current) use of anticoagulants 05/23/2010   Protein C deficiency ([place]) 11/05/2009   Post-surgical hypothyroidism 01/13/2008   Elevated PSA measurement 01/13/2008   Melanoma  ([place]) 01/13/2008   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   PULMONARY EMBOLISM, HX OF 11/09/2006    Immunization History  Administered Date(s) Administered   Influenza Split 11/13/2012   Influenza Whole 11/13/2009, 11/14/2011   Influenza, High Dose Seasonal PF 11/23/2015, 11/27/2016, 11/27/2017, 11/14/2018   Influenza-Unspecified 12/08/2013, 12/07/2014   PFIZER(Purple Top)SARS-COV-2 Vaccination 03/02/2019, 03/19/2019   Pneumococcal Conjugate-13 03/05/2017   Pneumococcal Polysaccharide-23 02/04/2016   Zoster, Live 03/14/2006    Conditions to be addressed/monitored:  Hypertension, Hypothyroidism, BPH, and History of PE   Care Plan : Elevated BP, Hypothyroidism, BPH, and History of PE  Updates made by Tomasa Blase, RPH since 08/12/2020 12:00 AM     Problem: Elevated BP, hypothyroidism, BPH, and History of PE   Priority: High  Onset Date: 06/30/2020     Long-Range Goal: Disease Management   Start Date: 06/30/2020  Expected End Date: 09/30/2020  This Visit's Progress: Not on track  Recent Progress: Not on track  Priority: High  Note:   Care Plan and Follow Up Patient Decision:  Patient agrees to Care Plan and Follow-up.  Plan: Telephone follow up appointment with care management team member scheduled for:  8 weeks  and The patient has been provided with contact information for the care management team and has been advised to call with any health related questions or concerns.   Current Barriers:  Unable to independently monitor therapeutic efficacy Unable to maintain control of blood pressure recently   Pharmacist Clinical Goal(s):  Patient will achieve adherence to monitoring guidelines and medication adherence to achieve therapeutic efficacy achieve control of blood pressure / HR  as evidenced by blood pressure / HR logs  through collaboration with PharmD and provider.   Interventions: 1:1 collaboration with Plotnikov, Evie Lacks, MD regarding  development and update of comprehensive plan of care as evidenced by provider attestation and co-signature Inter-disciplinary care team collaboration (see longitudinal plan of care) Comprehensive medication review performed; medication list updated in electronic medical record  Hypertension (BP goal <140/90) -Unknown level of control at this time  -Current treatment: N/a at this time - recently referred to cardiology 06/24/20 - as of 08/06/2020 - has not scheduled appointment to establish  -Medications previously tried: n/a   - ECHO from 06/19/2019 - normal  -Current home readings: states that he has not been check blood pressures - was in to see Dr. Hartford Poli yesterday and reports that blood pressure in office was 127/78 -Current dietary habits: reports that he consumes sodium reduced diet as is  -Current exercise habits: reports that his activity is limited as he finds himself to be short of breath after minimal exertion - previously would use exercise bike at gym daily (3-4 miles) and golfed regularly  -Reports hypotensive/hypertensive symptoms -Educated on BP goals and benefits of medications for prevention of heart attack, stroke and kidney damage; Daily salt intake goal < 2300 mg; Importance of home blood pressure monitoring; -Counseled to monitor BP at home daily to every other day, document, and provide log at future appointments -Recommended for patient to start checking blood pressure more regularly - agreeable to check every other day - Educated patient on blood pressure goals - patient will reach out before next appointment should BP be elevated >130/80 - Patient reports that he has not been checking blood pressure recently, could not recall last home BP check - is agreeable to start checking BP more regularly - at least every week  Hypothyroidism (Goal: continued Control / Prevention of disease progression ) -Controlled -Current treatment  Levothyroxine 131mg daily  -Medications  previously tried: armour thyroid 1282m -Recommended to continue current medication   BPH (Goal: Continued Control / Prevention of Disease progression) -Controlled -Current treatment  Tamsulosin 0.48m648maily  -Medications previously tried: n/a  -Recommended to continue current medication   History of PE (Goal: Prevention of Thrombus) -Controlled -Current treatment  Warfarin 5mg36mas directed by coumadin clinic  -Medications previously tried: n/a -Recommended to stop use of Advil PM Counseled on Risks of bleeding associated with NSAID (ibuprofen) use Recommended For patient to stop advil PM at this time, patient is taking for sleep, recommended for patient to use unisom / benadryl as this is the active ingredient/related to the active ingredient in the Advil PM that is helping with his sleep - recommended for patient to use APAP or voltaren gel if needed for pain   Health Maintenance -Current therapy:  Nature's Bounty Hair, Nail, and Skin chewable tablets - 2 tablets daily  Mineral oil - prn for constipation  Dry-eye relief drops  -Educated on Herbal supplement research is limited and benefits usually cannot be proven Cost vs benefit of each product must be carefully weighed by individual consumer -Patient is satisfied with current therapy and denies issues -Recommended to continue current medication   Patient Goals/Self-Care Activities Patient will:  - take medications as prescribed check blood pressure once daily to every other day , document, and provide at future appointments engage in dietary modifications by continuing reduced sodium diet   Follow Up Plan: Telephone follow up appointment with care management team member scheduled for: 1 week  The patient has been provided with contact information for the care management team and has been advised to call with any health related questions or concerns.     Medication Assistance: None required.  Patient affirms current  coverage meets needs.  Compliance/Adherence/Medication fill history: Care Gaps: Tetanus and Shingles vaccines   [facility]  [id number] [place] -[id number]: [phone]: [phone]/pharmacy #[id number] [place] [id number]: [phone] pill box? No - takes all of his medications in the morning  Pt endorses 100% compliance  Care Plan and Follow Up Patient Decision:  Patient agrees to Care Plan and Follow-up.  Plan: Telephone follow up appointment with care management team member scheduled for:  8 weeks and The patient has been provided with contact information for the care management team and has been advised to call with any health related questions or concerns.   DanieTomasa BlasermD Clinical Pharmacist, [place] examination/treatment/procedure(s) were performed by non-physician practitioner and as supervising physician I was immediately available for consultation/collaboration.  I agree with above. AleksLew Dawes

## 2020-08-23 ENCOUNTER — Other Ambulatory Visit: Payer: Self-pay

## 2020-08-23 ENCOUNTER — Ambulatory Visit (INDEPENDENT_AMBULATORY_CARE_PROVIDER_SITE_OTHER): Payer: Medicare Other | Admitting: General Practice

## 2020-08-23 DIAGNOSIS — Z86718 Personal history of other venous thrombosis and embolism: Secondary | ICD-10-CM

## 2020-08-23 DIAGNOSIS — Z7901 Long term (current) use of anticoagulants: Secondary | ICD-10-CM

## 2020-08-23 LAB — POCT INR: INR: 1.9 — AB (ref 2.0–3.0)

## 2020-08-23 NOTE — Patient Instructions (Addendum)
Pre visit review using our clinic review tool, if applicable. No additional management support is needed unless otherwise documented below in the visit note.  Please take 1 1/2 tablets today and then continue to take 1 (5 mg) tablet daily.  Re-check in 4 weeks.

## 2020-08-25 DIAGNOSIS — Z85820 Personal history of malignant melanoma of skin: Secondary | ICD-10-CM | POA: Diagnosis not present

## 2020-08-25 DIAGNOSIS — C44719 Basal cell carcinoma of skin of left lower limb, including hip: Secondary | ICD-10-CM | POA: Diagnosis not present

## 2020-08-25 DIAGNOSIS — C44729 Squamous cell carcinoma of skin of left lower limb, including hip: Secondary | ICD-10-CM | POA: Diagnosis not present

## 2020-08-25 DIAGNOSIS — Z85828 Personal history of other malignant neoplasm of skin: Secondary | ICD-10-CM | POA: Diagnosis not present

## 2020-09-02 ENCOUNTER — Other Ambulatory Visit: Payer: Self-pay | Admitting: Internal Medicine

## 2020-09-02 DIAGNOSIS — Z7901 Long term (current) use of anticoagulants: Secondary | ICD-10-CM

## 2020-09-20 ENCOUNTER — Ambulatory Visit (INDEPENDENT_AMBULATORY_CARE_PROVIDER_SITE_OTHER): Payer: Medicare Other | Admitting: General Practice

## 2020-09-20 ENCOUNTER — Other Ambulatory Visit: Payer: Self-pay

## 2020-09-20 DIAGNOSIS — Z7901 Long term (current) use of anticoagulants: Secondary | ICD-10-CM | POA: Diagnosis not present

## 2020-09-20 LAB — POCT INR: INR: 1.8 — AB (ref 2.0–3.0)

## 2020-09-20 NOTE — Patient Instructions (Signed)
Pre visit review using our clinic review tool, if applicable. No additional management support is needed unless otherwise documented below in the visit note.  Please take 1 1/2 tablets today and then change weekly dose to take 5 mg daily except take 7.5 mg on Mondays.  Re-check in 3 weeks.

## 2020-09-23 DIAGNOSIS — C44729 Squamous cell carcinoma of skin of left lower limb, including hip: Secondary | ICD-10-CM | POA: Diagnosis not present

## 2020-09-23 DIAGNOSIS — Z85828 Personal history of other malignant neoplasm of skin: Secondary | ICD-10-CM | POA: Diagnosis not present

## 2020-09-23 DIAGNOSIS — Z85820 Personal history of malignant melanoma of skin: Secondary | ICD-10-CM | POA: Diagnosis not present

## 2020-09-30 ENCOUNTER — Telehealth: Payer: Self-pay

## 2020-09-30 NOTE — Progress Notes (Signed)
    Chronic Care Management Pharmacy Assistant   Name: Clarence Hopkins  MRN: [id number] DOB: 02/29/1930   Reason for Encounter: Disease State General assessment.      Recent office visits:  08/23/20 Anti coag visit - Meriam Sprague RN 09/20/20 Anti coag visit - Meriam Sprague RN  Recent consult visits:  None noted.  Hospital visits:  None in previous 6 months  Medications: Outpatient Encounter Medications as of 09/30/2020  Medication Sig Note   b complex vitamins tablet Take 1 tablet by mouth daily. (Patient not taking: No sig reported)    Biotin w/ Vitamins C & E (HAIR/SKIN/NAILS) 1250-7.5-7.5 MCG-MG-UNT CHEW Chew 2 tablets by mouth daily.    Glycerin-Hypromellose-PEG 400 (DRY EYE RELIEF DROPS) 0.2-0.2-1 % SOLN Place 1 drop into both eyes daily as needed (Dry eye).    Ibuprofen-diphenhydrAMINE Cit (ADVIL PM PO) Take by mouth at bedtime as needed.    imiquimod (ALDARA) 5 % cream SMARTSIG:1 Packet(s) Topical Every Night (Patient not taking: No sig reported)    levothyroxine (SYNTHROID) 175 MCG tablet Take 175 mcg by mouth daily before breakfast.    mineral oil liquid Take 15 mLs by mouth daily as needed for moderate constipation. 06/30/2020: Reports that he is unsure of the dose that he takes     tamsulosin (FLOMAX) 0.4 MG CAPS capsule TAKE 1 CAPSULE BY MOUTH DAILY AFTER SUPPER    VITAMIN D, CHOLECALCIFEROL, PO Take by mouth. (Patient not taking: No sig reported)    warfarin (COUMADIN) 5 MG tablet TAKE 1 TABLET DAILY OR AS DIRECTED BY ANTICOAGULATION CLINIC    No facility-administered encounter medications on file as of 09/30/2020.   Have you had any problems recently with your health? Patient states he does not  Have you had any problems with your pharmacy? Patient states there are no concerns with his pharmacy.   What issues or side effects are you having with your medications? Per patient, he is currently not having any side effects from his medications.   What would you like me to  pass along to [facility] Nasuti Potts,CPP for them to help you with?  N/A  What can we do to take care of you better?  Per patient he is enjoying being in the Ccm program.   Misc comments:  Per patient, he is doing well, States that he currently has no concerns to report.   Star Rating Drugs: Levothyroxine 175 mcg   Andee Poles CMA

## 2020-10-07 ENCOUNTER — Telehealth: Payer: Medicare Other

## 2020-10-11 ENCOUNTER — Other Ambulatory Visit: Payer: Self-pay

## 2020-10-11 ENCOUNTER — Ambulatory Visit (INDEPENDENT_AMBULATORY_CARE_PROVIDER_SITE_OTHER): Payer: Medicare Other

## 2020-10-11 DIAGNOSIS — Z7901 Long term (current) use of anticoagulants: Secondary | ICD-10-CM | POA: Diagnosis not present

## 2020-10-11 LAB — POCT INR: INR: 1.4 — AB (ref 2.0–3.0)

## 2020-10-11 NOTE — Patient Instructions (Addendum)
Pre visit review using our clinic review tool, if applicable. No additional management support is needed unless otherwise documented below in the visit note.  Please take 2 tablets today and 2 tablets tomorrow and then change weekly dose to take 5 mg daily except take 7.5 mg on Mondays, Wednesdays and Fridays.  Re-check in 2 weeks.

## 2020-10-19 DIAGNOSIS — Z85820 Personal history of malignant melanoma of skin: Secondary | ICD-10-CM | POA: Diagnosis not present

## 2020-10-19 DIAGNOSIS — C44729 Squamous cell carcinoma of skin of left lower limb, including hip: Secondary | ICD-10-CM | POA: Diagnosis not present

## 2020-10-19 DIAGNOSIS — Z85828 Personal history of other malignant neoplasm of skin: Secondary | ICD-10-CM | POA: Diagnosis not present

## 2020-10-21 ENCOUNTER — Encounter: Payer: Self-pay | Admitting: Hematology & Oncology

## 2020-10-21 ENCOUNTER — Inpatient Hospital Stay: Payer: Medicare Other | Attending: Hematology & Oncology

## 2020-10-21 ENCOUNTER — Telehealth: Payer: Self-pay

## 2020-10-21 ENCOUNTER — Inpatient Hospital Stay (HOSPITAL_BASED_OUTPATIENT_CLINIC_OR_DEPARTMENT_OTHER): Payer: Medicare Other | Admitting: Hematology & Oncology

## 2020-10-21 ENCOUNTER — Ambulatory Visit (HOSPITAL_BASED_OUTPATIENT_CLINIC_OR_DEPARTMENT_OTHER)
Admission: RE | Admit: 2020-10-21 | Discharge: 2020-10-21 | Disposition: A | Discharge status: Home / Self Care | Payer: Medicare Other | Source: Ambulatory Visit | Attending: Hematology & Oncology | Admitting: Hematology & Oncology

## 2020-10-21 ENCOUNTER — Other Ambulatory Visit: Payer: Self-pay

## 2020-10-21 VITALS — BP 146/107 | HR 58 | Temp 97.4°F | Resp 20 | Wt 211.0 lb

## 2020-10-21 DIAGNOSIS — C73 Malignant neoplasm of thyroid gland: Secondary | ICD-10-CM | POA: Diagnosis not present

## 2020-10-21 DIAGNOSIS — R911 Solitary pulmonary nodule: Secondary | ICD-10-CM | POA: Diagnosis not present

## 2020-10-21 DIAGNOSIS — I700 Atherosclerosis of aorta: Secondary | ICD-10-CM | POA: Diagnosis not present

## 2020-10-21 DIAGNOSIS — R918 Other nonspecific abnormal finding of lung field: Secondary | ICD-10-CM | POA: Diagnosis not present

## 2020-10-21 LAB — CBC WITH DIFFERENTIAL (CANCER CENTER ONLY)
Abs Immature Granulocytes: 0.02 10*3/uL (ref 0.00–0.07)
Basophils Absolute: 0 10*3/uL (ref 0.0–0.1)
Basophils Relative: 1 %
Eosinophils Absolute: 0.2 10*3/uL (ref 0.0–0.5)
Eosinophils Relative: 3 %
HCT: 44 % (ref 39.0–52.0)
Hemoglobin: 13.8 g/dL (ref 13.0–17.0)
Immature Granulocytes: 0 %
Lymphocytes Relative: 23 %
Lymphs Abs: 1.3 10*3/uL (ref 0.7–4.0)
MCH: 28.4 pg (ref 26.0–34.0)
MCHC: 31.4 g/dL (ref 30.0–36.0)
MCV: 90.5 fL (ref 80.0–100.0)
Monocytes Absolute: 0.4 10*3/uL (ref 0.1–1.0)
Monocytes Relative: 8 %
Neutro Abs: 3.7 10*3/uL (ref 1.7–7.7)
Neutrophils Relative %: 65 %
Platelet Count: 182 10*3/uL (ref 150–400)
RBC: 4.86 MIL/uL (ref 4.22–5.81)
RDW: 13.6 % (ref 11.5–15.5)
WBC Count: 5.7 10*3/uL (ref 4.0–10.5)
nRBC: 0 % (ref 0.0–0.2)

## 2020-10-21 LAB — CMP (CANCER CENTER ONLY)
ALT: 16 U/L (ref 0–44)
AST: 25 U/L (ref 15–41)
Albumin: 4.4 g/dL (ref 3.5–5.0)
Alkaline Phosphatase: 71 U/L (ref 38–126)
Anion gap: 5 (ref 5–15)
BUN: 19 mg/dL (ref 8–23)
CO2: 33 mmol/L — ABNORMAL HIGH (ref 22–32)
Calcium: 9.6 mg/dL (ref 8.9–10.3)
Chloride: 98 mmol/L (ref 98–111)
Creatinine: 1.37 mg/dL — ABNORMAL HIGH (ref 0.61–1.24)
GFR, Estimated: 50 mL/min — ABNORMAL LOW (ref >60)
Glucose, Bld: 92 mg/dL (ref 70–99)
Potassium: 4.9 mmol/L (ref 3.5–5.1)
Sodium: 136 mmol/L (ref 135–145)
Total Bilirubin: 0.8 mg/dL (ref 0.3–1.2)
Total Protein: 7.6 g/dL (ref 6.5–8.1)

## 2020-10-21 LAB — LACTATE DEHYDROGENASE: LDH: 189 U/L (ref 98–192)

## 2020-10-21 NOTE — Telephone Encounter (Signed)
Appts made and printed for pt per 10/21/20 los  [employer]

## 2020-10-21 NOTE — Progress Notes (Signed)
Hematology and Oncology Follow Up Visit  Clarence Hopkins [id number] 1930-12-30 87 y.o. 10/21/2020   Principle Diagnosis:  Metastatic papillary carcinoma of the thyroid  Current Therapy:   S/p I-131 therapy on 08/27/2019     Interim History:  Clarence Hopkins is back for follow-up.  Unfortunately, his wife cannot come with him.  It is nice whenever she comes in.  He is feeling okay.  He does have some occasional shortness of breath.  We were able to get his CT scan done today.  There might of been some slight increase in a left lower lobe paraspinal nodule which measured 3.3 cm.  Previously measured 2.9 cm.  Everything else looks relatively stable.  I think he sees his endocrinologist-Dr. Levy-tomorrow.  He has had no problems with nausea or vomiting.  There is been no change in bowel or bladder habits.  He is really not coughing up anything.  There is no hemoptysis.  He has had no issues with leg swelling.  He is on Coumadin.  I think this is for atrial fibrillation.  This is being monitored by cardiology.    Overall, I would have to say that his performance status is by ECOG 1-2.     Medications:  Current Outpatient Medications:    Biotin w/ Vitamins C & E (HAIR/SKIN/NAILS PO), Chew 2 tablets by mouth daily., Disp: , Rfl:    clindamycin (CLEOCIN) 300 MG capsule, Take 300 mg by mouth 3 (three) times daily., Disp: , Rfl:    Glycerin-Hypromellose-PEG 400 (DRY EYE RELIEF DROPS) 0.2-0.2-1 % SOLN, Place 1 drop into both eyes daily as needed (Dry eye)., Disp: , Rfl:    Ibuprofen-diphenhydrAMINE Cit (ADVIL PM PO), Take by mouth at bedtime as needed., Disp: , Rfl:    levothyroxine (SYNTHROID) 175 MCG tablet, Take 175 mcg by mouth daily before breakfast., Disp: , Rfl:    mineral oil liquid, Take 15 mLs by mouth daily as needed for moderate constipation., Disp: , Rfl:    tamsulosin (FLOMAX) 0.4 MG CAPS capsule, TAKE 1 CAPSULE BY MOUTH DAILY AFTER SUPPER, Disp: 90 capsule, Rfl: 3   warfarin (COUMADIN)  5 MG tablet, TAKE 1 TABLET DAILY OR AS DIRECTED BY ANTICOAGULATION CLINIC (Patient taking differently: 10/21/2020 Takes 7.25 mg on M_W_F. and 5 mg all other days.Marland Kitchen), Disp: 90 tablet, Rfl: 1   imiquimod (ALDARA) 5 % cream, SMARTSIG:1 Packet(s) Topical Every Night (Patient not taking: No sig reported), Disp: , Rfl:   Allergies: No Known Allergies  Past Medical History, Surgical history, Social history, and Family History were reviewed and updated.  Review of Systems: Review of Systems  Constitutional: Negative.   HENT:  Negative.    Eyes: Negative.   Respiratory: Negative.    Cardiovascular: Negative.   Gastrointestinal: Negative.   Endocrine: Negative.   Genitourinary: Negative.    Musculoskeletal: Negative.   Skin: Negative.   Neurological: Negative.   Hematological: Negative.   Psychiatric/Behavioral: Negative.     Physical Exam:  weight is 211 lb (95.7 kg). His oral temperature is 97.4 F (36.3 C) (abnormal). His blood pressure is 146/107 (abnormal) and his pulse is 58 (abnormal). His respiration is 20.   Wt Readings from Last 3 Encounters:  10/21/20 211 lb (95.7 kg)  08/04/20 203 lb (92.1 kg)  06/24/20 207 lb (93.9 kg)    Physical Exam Vitals reviewed.  HENT:     Head: Normocephalic and atraumatic.  Eyes:     Pupils: Pupils are equal, round, and reactive to light.  Cardiovascular:     Heart sounds: Normal heart sounds.     Comments: Cardiac exam is slow but regular.  He does have occasional extra beats.  He does have an occasional skipped beat.  He has 1/6 systolic ejection murmur. Pulmonary:     Effort: Pulmonary effort is normal.     Breath sounds: Normal breath sounds.  Abdominal:     General: Bowel sounds are normal.     Palpations: Abdomen is soft.  Musculoskeletal:        General: No tenderness or deformity. Normal range of motion.     Cervical back: Normal range of motion.  Lymphadenopathy:     Cervical: No cervical adenopathy.  Skin:    General: Skin is  warm and dry.     Findings: No erythema or rash.  Neurological:     Mental Status: He is alert and oriented to person, place, and time.  Psychiatric:        Behavior: Behavior normal.        Thought Content: Thought content normal.        Judgment: Judgment normal.     Lab Results  Component Value Date   WBC 5.7 10/21/2020   HGB 13.8 10/21/2020   HCT 44.0 10/21/2020   MCV 90.5 10/21/2020   PLT 182 10/21/2020     Chemistry      Component Value Date/Time   NA 136 10/21/2020 0917   K 4.9 10/21/2020 0917   CL 98 10/21/2020 0917   CO2 33 (H) 10/21/2020 0917   BUN 19 10/21/2020 0917   CREATININE 1.37 (H) 10/21/2020 0917      Component Value Date/Time   CALCIUM 9.6 10/21/2020 0917   ALKPHOS 71 10/21/2020 0917   AST 25 10/21/2020 0917   ALT 16 10/21/2020 0917   BILITOT 0.8 10/21/2020 0917      Impression and Plan: Clarence Hopkins is a very nice 87 year old white male.  He has metastatic thyroid cancer.  This is differentiated thyroid cancer.  Thankfully it is responsive to the radioactive iodine.  He is still responding.    It is hard to say if there is any obvious progression.  Again, on the CT scan, the left lower lobe nodule was a little bit bigger.  I think we have to keep monitoring his scans.  I would like to get another scan on him in about 3 months.  I am not sure if he is able to get any more radioactive iodine.  I think we are finding that there is progressive disease, we will get have to think about some kind of systemic therapy for him.  Again, quality of life is really what we are looking at.  Clarence Hopkins wants to make sure his quality of life is decent.  It is always fun talking to Clarence Hopkins.  He is incredibly eloquent and has so much experiences.   [name], MD 9/8/202210:49 AM

## 2020-10-25 ENCOUNTER — Other Ambulatory Visit: Payer: Self-pay

## 2020-10-25 ENCOUNTER — Ambulatory Visit (INDEPENDENT_AMBULATORY_CARE_PROVIDER_SITE_OTHER): Payer: Medicare Other

## 2020-10-25 ENCOUNTER — Telehealth: Payer: Self-pay

## 2020-10-25 DIAGNOSIS — Z7901 Long term (current) use of anticoagulants: Secondary | ICD-10-CM

## 2020-10-25 LAB — POCT INR: INR: 1.8 — AB (ref 2.0–3.0)

## 2020-10-25 NOTE — Chronic Care Management (AMB) (Signed)
    Chronic Care Management Pharmacy Assistant   Name: Clarence Hopkins  MRN: [id number] DOB: 01/11/1931  Reason for Encounter: Disease State General  Recent office visits:  N/A  Recent consult visits:  10/21/20 [name] MD(Oncology)- Patient was seen for Thyroid cancer. Chest xrays were ordered and patient discontinued Vitamin B and Cholecalciferol. Follow up in 3 months.  Hospital visits:  None in previous 6 months  Medications: Outpatient Encounter Medications as of 10/25/2020  Medication Sig Note   Biotin w/ Vitamins C & E (HAIR/SKIN/NAILS PO) Chew 2 tablets by mouth daily.    clindamycin (CLEOCIN) 300 MG capsule Take 300 mg by mouth 3 (three) times daily.    Glycerin-Hypromellose-PEG 400 (DRY EYE RELIEF DROPS) 0.2-0.2-1 % SOLN Place 1 drop into both eyes daily as needed (Dry eye).    Ibuprofen-diphenhydrAMINE Cit (ADVIL PM PO) Take by mouth at bedtime as needed.    imiquimod (ALDARA) 5 % cream SMARTSIG:1 Packet(s) Topical Every Night (Patient not taking: No sig reported)    levothyroxine (SYNTHROID) 175 MCG tablet Take 175 mcg by mouth daily before breakfast.    mineral oil liquid Take 15 mLs by mouth daily as needed for moderate constipation. 06/30/2020: Reports that he is unsure of the dose that he takes     tamsulosin (FLOMAX) 0.4 MG CAPS capsule TAKE 1 CAPSULE BY MOUTH DAILY AFTER SUPPER    warfarin (COUMADIN) 5 MG tablet TAKE 1 TABLET DAILY OR AS DIRECTED BY ANTICOAGULATION CLINIC (Patient taking differently: 10/21/2020 Takes 7.25 mg on M_W_F. and 5 mg all other days.Marland Kitchen)    No facility-administered encounter medications on file as of 10/25/2020.    Have you had any problems recently with your health? Patient wife states there are no new issues with his health. They are just taking it day by day.  Have you had any problems with your pharmacy? Patients wife says they have no problems receiving his medications.   What issues or side effects are you having with your  medications? Patients wife says she is unaware of any side effects.  What would you like me to pass along to [facility] for them to help you with?  Patients wife says she appreciates [place] calling to check on him and she has no cencerns at the moment.  What can we do to take care of you better? Patients wife says she appreciates [place] calling to check on him and she has no concerns at the moment.  [place] Pharmacist Assistant [phone]

## 2020-10-25 NOTE — Patient Instructions (Addendum)
Pre visit review using our clinic review tool, if applicable. No additional management support is needed unless otherwise documented below in the visit note.  Please increase dose to 2 tablets today and then continue 5 mg daily except take 7.5 mg on Mondays, Wednesdays and Fridays. Re-check in 4 weeks.

## 2020-10-27 LAB — TSH: TSH: 70.4 — AB (ref 0.41–5.90)

## 2020-10-30 ENCOUNTER — Emergency Department (HOSPITAL_BASED_OUTPATIENT_CLINIC_OR_DEPARTMENT_OTHER)
Admission: EM | Admit: 2020-10-30 | Discharge: 2020-10-30 | Disposition: A | Discharge status: Home / Self Care | Payer: Medicare Other | Attending: Emergency Medicine | Admitting: Emergency Medicine

## 2020-10-30 ENCOUNTER — Other Ambulatory Visit: Payer: Self-pay

## 2020-10-30 ENCOUNTER — Encounter (HOSPITAL_BASED_OUTPATIENT_CLINIC_OR_DEPARTMENT_OTHER): Payer: Self-pay

## 2020-10-30 DIAGNOSIS — Z96652 Presence of left artificial knee joint: Secondary | ICD-10-CM | POA: Diagnosis not present

## 2020-10-30 DIAGNOSIS — R339 Retention of urine, unspecified: Secondary | ICD-10-CM

## 2020-10-30 DIAGNOSIS — Z87891 Personal history of nicotine dependence: Secondary | ICD-10-CM | POA: Insufficient documentation

## 2020-10-30 DIAGNOSIS — Z79899 Other long term (current) drug therapy: Secondary | ICD-10-CM | POA: Diagnosis not present

## 2020-10-30 DIAGNOSIS — Z96691 Finger-joint replacement of right hand: Secondary | ICD-10-CM | POA: Insufficient documentation

## 2020-10-30 DIAGNOSIS — Z85828 Personal history of other malignant neoplasm of skin: Secondary | ICD-10-CM | POA: Diagnosis not present

## 2020-10-30 DIAGNOSIS — Z7901 Long term (current) use of anticoagulants: Secondary | ICD-10-CM | POA: Diagnosis not present

## 2020-10-30 DIAGNOSIS — K5900 Constipation, unspecified: Secondary | ICD-10-CM

## 2020-10-30 DIAGNOSIS — R1030 Lower abdominal pain, unspecified: Secondary | ICD-10-CM | POA: Diagnosis not present

## 2020-10-30 DIAGNOSIS — E039 Hypothyroidism, unspecified: Secondary | ICD-10-CM | POA: Diagnosis not present

## 2020-10-30 DIAGNOSIS — Z85850 Personal history of malignant neoplasm of thyroid: Secondary | ICD-10-CM | POA: Diagnosis not present

## 2020-10-30 LAB — CBC WITH DIFFERENTIAL/PLATELET
Abs Immature Granulocytes: 0.02 10*3/uL (ref 0.00–0.07)
Basophils Absolute: 0 10*3/uL (ref 0.0–0.1)
Basophils Relative: 0 %
Eosinophils Absolute: 0.1 10*3/uL (ref 0.0–0.5)
Eosinophils Relative: 1 %
HCT: 44.6 % (ref 39.0–52.0)
Hemoglobin: 14.2 g/dL (ref 13.0–17.0)
Immature Granulocytes: 0 %
Lymphocytes Relative: 10 %
Lymphs Abs: 0.9 10*3/uL (ref 0.7–4.0)
MCH: 28.1 pg (ref 26.0–34.0)
MCHC: 31.8 g/dL (ref 30.0–36.0)
MCV: 88.3 fL (ref 80.0–100.0)
Monocytes Absolute: 0.5 10*3/uL (ref 0.1–1.0)
Monocytes Relative: 6 %
Neutro Abs: 7.4 10*3/uL (ref 1.7–7.7)
Neutrophils Relative %: 83 %
Platelets: 203 10*3/uL (ref 150–400)
RBC: 5.05 MIL/uL (ref 4.22–5.81)
RDW: 14 % (ref 11.5–15.5)
WBC: 8.9 10*3/uL (ref 4.0–10.5)
nRBC: 0 % (ref 0.0–0.2)

## 2020-10-30 LAB — URINALYSIS, ROUTINE W REFLEX MICROSCOPIC
Bilirubin Urine: NEGATIVE
Glucose, UA: NEGATIVE mg/dL
Hgb urine dipstick: NEGATIVE
Ketones, ur: NEGATIVE mg/dL
Leukocytes,Ua: NEGATIVE
Nitrite: NEGATIVE
Protein, ur: NEGATIVE mg/dL
Specific Gravity, Urine: 1.018 (ref 1.005–1.030)
pH: 6.5 (ref 5.0–8.0)

## 2020-10-30 LAB — COMPREHENSIVE METABOLIC PANEL
ALT: 23 U/L (ref 0–44)
AST: 38 U/L (ref 15–41)
Albumin: 4.7 g/dL (ref 3.5–5.0)
Alkaline Phosphatase: 73 U/L (ref 38–126)
Anion gap: 8 (ref 5–15)
BUN: 16 mg/dL (ref 8–23)
CO2: 30 mmol/L (ref 22–32)
Calcium: 10.2 mg/dL (ref 8.9–10.3)
Chloride: 97 mmol/L — ABNORMAL LOW (ref 98–111)
Creatinine, Ser: 1.38 mg/dL — ABNORMAL HIGH (ref 0.61–1.24)
GFR, Estimated: 49 mL/min — ABNORMAL LOW (ref >60)
Glucose, Bld: 104 mg/dL — ABNORMAL HIGH (ref 70–99)
Potassium: 4.5 mmol/L (ref 3.5–5.1)
Sodium: 135 mmol/L (ref 135–145)
Total Bilirubin: 0.9 mg/dL (ref 0.3–1.2)
Total Protein: 8.2 g/dL — ABNORMAL HIGH (ref 6.5–8.1)

## 2020-10-30 LAB — LIPASE, BLOOD: Lipase: 17 U/L (ref 11–51)

## 2020-10-30 MED ORDER — OXYCODONE-ACETAMINOPHEN 5-325 MG PO TABS
1.0000 | ORAL_TABLET | ORAL | Status: DC | PRN
  Administered 2020-10-30: 1 via ORAL
  Filled 2020-10-30: qty 1

## 2020-10-30 MED ORDER — POLYETHYLENE GLYCOL 3350 17 GM/SCOOP PO POWD: 1.0000 | Freq: Once | ORAL | 0 refills | Status: AC

## 2020-10-30 NOTE — ED Notes (Signed)
Foley catheter clamped off  at 14:10 after 1000 ml retrieved to avoid bladder cramping. Will unclamp at 14:30

## 2020-10-30 NOTE — ED Triage Notes (Signed)
Pt is followed by urology.

## 2020-10-30 NOTE — ED Triage Notes (Signed)
Pt c/o lower abd pain and states he needs "urinary relief". Poor historian, states he has had to have a catheter to relive urinary retention before but not sure why. Last BM X3 days and pt reports he has not urinated at all today. Pt reports he has had good PO intake but could not urinate after dinner last night.

## 2020-10-30 NOTE — ED Provider Notes (Addendum)
[facility] EMERGENCY DEPT Provider Note   CSN: [id number] Arrival date & time: 10/30/20  1213     History Chief Complaint  Patient presents with  . Abdominal Pain    Clarence Hopkins is a 87 y.o. male.  HPI  Patient presents with urinary retention.  Last time micturating was at 8 PM last night, has been unable to produce urine since then.  He is also having abdominal pain due to increased pressure.  The pain is constant, worse with walking.  Has not had any fevers at home, no nausea, no vomiting.  History of the same 10 years ago, no history of urinary obstruction or prostate cancer.  Patient does have lung cancer but has not started any chemotherapy yet.  Has not tried any alleviating factors, walking is an aggravating factor.  Denies any blood in the urine preceding this event.  Past Medical History:  Diagnosis Date  . Arthritis   . Cancer ([place]) 2003   melanoma  L ear  . GERD (gastroesophageal reflux disease)   . Hypothyroidism    post op  . Lung mass   . Pulmonary embolism ([place]) 2005 & 2007   protein C deficiency    Patient Active Problem List   Diagnosis Date Noted  . Multifocal pneumonia 08/04/2020  . Right lower lobe lung mass   . DOE (dyspnea on exertion) 06/03/2019  . Near syncope 06/03/2019  . Dysphagia 03/07/2018  . Thyroid cancer ([place]) 02/10/2016  . Medicare annual wellness visit, subsequent 02/02/2015  . Hypertriglyceridemia 12/22/2013  . Encounter for therapeutic drug monitoring 04/09/2013  . History of basal cell cancer 10/30/2012  . Long term (current) use of anticoagulants 05/23/2010  . Protein C deficiency ([place]) 11/05/2009  . Post-surgical hypothyroidism 01/13/2008  . Elevated PSA measurement 01/13/2008  . Melanoma ([place]) 01/13/2008  . DISPLCMT CERV INTERVERT [place] WITHOUT MYELOPATHY 02/15/2007  . PULMONARY EMBOLISM, HX OF 11/09/2006    Past Surgical History:  Procedure Laterality Date  . BRONCHIAL BRUSHINGS  07/15/2019   Procedure:  BRONCHIAL BRUSHINGS;  Surgeon: Rigoberto Noel, MD;  Location: [facility];  Service: Cardiopulmonary;;  . BRONCHIAL WASHINGS  07/15/2019   Procedure: BRONCHIAL WASHINGS;  Surgeon: Rigoberto Noel, MD;  Location[facility] ENDOSCOPY;  Service: Cardiopulmonary;;  . CARDIAC CATHETERIZATION     > 20 years ago " everything was okay"   . CERVICAL FUSION      X 2; Dr Vertell Limber  . COLONOSCOPY    . ENDOBRONCHIAL ULTRASOUND N/A 07/15/2019   Procedure: ENDOBRONCHIAL ULTRASOUND;  Surgeon: Rigoberto Noel, MD;  Location: [facility];  Service: Cardiopulmonary;  Laterality: N/A;  . FINE NEEDLE ASPIRATION  07/15/2019   Procedure: FINE NEEDLE ASPIRATION (FNA) LINEAR;  Surgeon: Rigoberto Noel, MD;  Location: [facility];  Service: Cardiopulmonary;;  . FINGER ARTHROPLASTY Right 04/17/2013   Procedure: IMPLANT ARTHROPLASTY RIGHT INDEX;  Surgeon: Cammie Sickle., MD;  Location: [facility];  Service: Orthopedics;  Laterality: Right;  . LUMBAR LAMINECTOMY  2010   Dr Becky Sax  . MELANOMA EXCISION  2003   L ear  . THYROIDECTOMY  1971   cytology indefinite  . TONSILLECTOMY    . TOTAL KNEE ARTHROPLASTY  2004   left  . VIDEO BRONCHOSCOPY N/A 07/15/2019   Procedure: VIDEO BRONCHOSCOPY WITHOUT FLUORO;  Surgeon: Rigoberto Noel, MD;  Location: [facility];  Service: Cardiopulmonary;  Laterality: N/A;  . WISDOM TOOTH EXTRACTION         Family History  Problem  Relation Age of Onset  . Breast cancer Mother   . Alzheimer's disease Father   . Breast cancer Maternal Grandmother   . Breast cancer Sister   . Cancer Sister   . Diabetes Neg Hx   . Stroke Neg Hx   . Heart disease Neg Hx     Social History   Tobacco Use  . Smoking status: Former    Packs/day: 0.75    Years: 41.00    Pack years: 30.75    Types: Cigarettes    Quit date: 02/13/1993    Years since quitting: 27.7  . Smokeless tobacco: Never  . Tobacco comments:    smoked 1956-1995, up to 1/2 ppd  Vaping Use  . Vaping Use: Never used  Substance  Use Topics  . Alcohol use: Yes    Comment:  Vodka 2 oz/ night  . Drug use: No    Home Medications Prior to Admission medications   Medication Sig Start Date End Date Taking? Authorizing Provider  tamsulosin (FLOMAX) 0.4 MG CAPS capsule TAKE 1 CAPSULE BY MOUTH DAILY AFTER SUPPER 08/05/20  Yes Plotnikov, Evie Lacks, MD  warfarin (COUMADIN) 5 MG tablet TAKE 1 TABLET DAILY OR AS DIRECTED BY ANTICOAGULATION CLINIC Patient taking differently: 10/21/2020 Takes 7.25 mg on M_W_F. and 5 mg all other days.. 09/02/20  Yes Plotnikov, Evie Lacks, MD  Biotin w/ Vitamins C & E (HAIR/SKIN/NAILS PO) Chew 2 tablets by mouth daily.    [provider]  clindamycin (CLEOCIN) 300 MG capsule Take 300 mg by mouth 3 (three) times daily. 10/19/20   [provider]  Glycerin-Hypromellose-PEG 400 (DRY EYE RELIEF DROPS) 0.2-0.2-1 % SOLN Place 1 drop into both eyes daily as needed (Dry eye).    [provider]  Ibuprofen-diphenhydrAMINE Cit (ADVIL PM PO) Take by mouth at bedtime as needed.    [provider]  imiquimod (ALDARA) 5 % cream SMARTSIG:1 Packet(s) Topical Every Night Patient not taking: No sig reported 11/06/19   [provider]  levothyroxine (SYNTHROID) 175 MCG tablet Take 175 mcg by mouth daily before breakfast.    [provider]  mineral oil liquid Take 15 mLs by mouth daily as needed for moderate constipation.    [provider]    Allergies    Patient has no known allergies.  Review of Systems   Review of Systems  Constitutional:  Negative for chills and fever.  HENT:  Negative for ear pain and sore throat.   Eyes:  Negative for pain and visual disturbance.  Respiratory:  Negative for cough and shortness of breath.   Cardiovascular:  Negative for chest pain and palpitations.  Gastrointestinal:  Positive for abdominal pain. Negative for nausea and vomiting.  Genitourinary:  Positive for difficulty urinating. Negative for dysuria, flank pain and  hematuria.  Musculoskeletal:  Negative for arthralgias and back pain.  Skin:  Negative for color change and rash.  Neurological:  Negative for seizures and syncope.  All other systems reviewed and are negative.  Physical Exam Updated Vital Signs BP (!) 159/99 (BP Location: Right Arm)   Pulse 93   Temp 97.7 F (36.5 C) (Oral)   Resp 20   Ht 6\' 3"  (1.905 m)   Wt 96.6 kg   SpO2 99%   BMI 26.62 kg/m   Physical Exam Vitals and nursing note reviewed. Exam conducted with a chaperone present.  Constitutional:      Appearance: Normal appearance.  HENT:     Head: Normocephalic and atraumatic.  Eyes:     General: No scleral icterus.       Right eye: No discharge.        Left eye: No discharge.     Extraocular Movements: Extraocular movements intact.     Pupils: Pupils are equal, round, and reactive to light.  Cardiovascular:     Rate and Rhythm: Normal rate and regular rhythm.     Pulses: Normal pulses.     Heart sounds: Normal heart sounds. No murmur heard.   No friction rub. No gallop.  Pulmonary:     Effort: Pulmonary effort is normal. No respiratory distress.     Breath sounds: Normal breath sounds.  Abdominal:     General: Abdomen is flat. Bowel sounds are normal. There is no distension.     Palpations: Abdomen is soft.     Tenderness: There is abdominal tenderness in the suprapubic area. There is no guarding.  Skin:    General: Skin is warm and dry.     Coloration: Skin is not jaundiced.  Neurological:     Mental Status: He is alert. Mental status is at baseline.     Coordination: Coordination normal.   ED Results / Procedures / Treatments   Labs (all labs ordered are listed, but only abnormal results are displayed) Labs Reviewed  CBC WITH DIFFERENTIAL/PLATELET  COMPREHENSIVE METABOLIC PANEL  LIPASE, BLOOD  URINALYSIS, ROUTINE W REFLEX MICROSCOPIC    EKG None  Radiology No results found.  Procedures Procedures   Medications Ordered in ED Medications   oxyCODONE-acetaminophen (PERCOCET/ROXICET) 5-325 MG per tablet 1 tablet (1 tablet Oral Given 10/30/20 1333)    ED Course  I have reviewed the triage vital signs and the nursing notes.  Pertinent labs & imaging results that were available during my care of the patient were reviewed by me and considered in my medical decision making (see chart for details).    MDM Rules/Calculators/A&P                           Patient abdomen is soft, bowel sounds present.  Patient is not having vomiting and is passing gas, low suspicion for obstruction.  Think more likely he is constipated. Will advise miralax.  Urinary retention resolved with Foley catheter, his abdominal pain resolved with resolution of the urinary retention.  No back trauma, no IV drug use.  Doubt cauda equina or epidural abscess.  Suspect the urinary retention is likely due to his age and BPH.  We will have him follow-up with urology next week to have Foley catheter removed and for additional evaluation as needed.  No signs of UTI on UA, no elevated lipase concerning for pancreatitis.  No leukocytosis or anemia.  Discussed HPI, physical exam and plan of care for this patient with attending R. Dykstra. The attending physician evaluated this patient as part of a shared visit and agrees with plan of care.   Final Clinical Impression(s) / ED Diagnoses Final diagnoses:  None    Rx / DC Orders ED Discharge Orders     None        Sherrill Raring, PA-C 10/30/20 1443    Sherrill Raring, PA-C 10/30/20 1444    Lucrezia Starch, MD 10/31/20 [phone]

## 2020-10-30 NOTE — Discharge Instructions (Addendum)
Schedule follow-up with urology in 1 week to have the Foley removed. Take MiraLAX as needed for constipation.

## 2020-10-30 NOTE — ED Notes (Signed)
Bladder scan results: 66ml

## 2020-10-31 ENCOUNTER — Encounter (HOSPITAL_BASED_OUTPATIENT_CLINIC_OR_DEPARTMENT_OTHER): Payer: Self-pay

## 2020-10-31 ENCOUNTER — Emergency Department (HOSPITAL_BASED_OUTPATIENT_CLINIC_OR_DEPARTMENT_OTHER)
Admission: EM | Admit: 2020-10-31 | Discharge: 2020-10-31 | Disposition: A | Discharge status: Home / Self Care | Payer: Medicare Other | Attending: Emergency Medicine | Admitting: Emergency Medicine

## 2020-10-31 DIAGNOSIS — Y846 Urinary catheterization as the cause of abnormal reaction of the patient, or of later complication, without mention of misadventure at the time of the procedure: Secondary | ICD-10-CM | POA: Insufficient documentation

## 2020-10-31 DIAGNOSIS — Z85828 Personal history of other malignant neoplasm of skin: Secondary | ICD-10-CM | POA: Insufficient documentation

## 2020-10-31 DIAGNOSIS — T83091A Other mechanical complication of indwelling urethral catheter, initial encounter: Secondary | ICD-10-CM | POA: Insufficient documentation

## 2020-10-31 DIAGNOSIS — Z87891 Personal history of nicotine dependence: Secondary | ICD-10-CM | POA: Diagnosis not present

## 2020-10-31 DIAGNOSIS — Z7901 Long term (current) use of anticoagulants: Secondary | ICD-10-CM | POA: Diagnosis not present

## 2020-10-31 DIAGNOSIS — Z85850 Personal history of malignant neoplasm of thyroid: Secondary | ICD-10-CM | POA: Insufficient documentation

## 2020-10-31 DIAGNOSIS — T839XXA Unspecified complication of genitourinary prosthetic device, implant and graft, initial encounter: Secondary | ICD-10-CM

## 2020-10-31 DIAGNOSIS — Z79899 Other long term (current) drug therapy: Secondary | ICD-10-CM | POA: Insufficient documentation

## 2020-10-31 DIAGNOSIS — T83098A Other mechanical complication of other indwelling urethral catheter, initial encounter: Secondary | ICD-10-CM | POA: Diagnosis not present

## 2020-10-31 DIAGNOSIS — Z96652 Presence of left artificial knee joint: Secondary | ICD-10-CM | POA: Insufficient documentation

## 2020-10-31 DIAGNOSIS — E039 Hypothyroidism, unspecified: Secondary | ICD-10-CM | POA: Insufficient documentation

## 2020-10-31 NOTE — Discharge Instructions (Addendum)
You can continue to drain the new Foley catheter bag.  Does not need to be replaced each day.  They will give you replacement information and equipment, if needed, at your urology appointment.  Please follow-up with your urologist as planned.

## 2020-10-31 NOTE — ED Provider Notes (Signed)
[facility] EMERGENCY DEPT Provider Note   CSN: [id number] Arrival date & time: 10/31/20  7939     History No chief complaint on file.   Clarence Hopkins is a 87 y.o. male.  HPI 87 year old male history of GERD, urinary retention, status post Foley placement yesterday, pulmonary embolism, on Coumadin presents today complaining of Foley catheter problems.  He states that he read in the directions that the bag was only to be used once and he was concerned that he was having to reuse it.  He also has noted some bleeding from the tip of the catheter.  He states this is not severe and he is not really concerned about this.  He also thinks that the Foley catheter bag is on upside down.  He states that he has had resolution of his symptoms.  He has no longer feel like he needs to urinate.  He is not really having any pain from the Foley catheter.  He has follow-up in place [facility] who is followed with before.  He had 1 episode of urinary retention in the past.    Past Medical History:  Diagnosis Date   Arthritis    Cancer ([place]) 2003   melanoma  L ear   GERD (gastroesophageal reflux disease)    Hypothyroidism    post op   Lung mass    Pulmonary embolism ([place]) 2005 & 2007   protein C deficiency    Patient Active Problem List   Diagnosis Date Noted   Multifocal pneumonia 08/04/2020   Right lower lobe lung mass    DOE (dyspnea on exertion) 06/03/2019   Near syncope 06/03/2019   Dysphagia 03/07/2018   Thyroid cancer ([place]) 02/10/2016   Medicare annual wellness visit, subsequent 02/02/2015   Hypertriglyceridemia 12/22/2013   Encounter for therapeutic drug monitoring 04/09/2013   History of basal cell cancer 10/30/2012   Long term (current) use of anticoagulants 05/23/2010   Protein C deficiency ([place]) 11/05/2009   Post-surgical hypothyroidism 01/13/2008   Elevated PSA measurement 01/13/2008   Melanoma ([place]) 01/13/2008   DISPLCMT CERV INTERVERT [place] WITHOUT  MYELOPATHY 02/15/2007   PULMONARY EMBOLISM, HX OF 11/09/2006    Past Surgical History:  Procedure Laterality Date   BRONCHIAL BRUSHINGS  07/15/2019   Procedure: BRONCHIAL BRUSHINGS;  Surgeon: Rigoberto Noel, MD;  Location: [facility];  Service: Cardiopulmonary;;   BRONCHIAL WASHINGS  07/15/2019   Procedure: BRONCHIAL WASHINGS;  Surgeon: Rigoberto Noel, MD;  [facility];  Service: Cardiopulmonary;;   CARDIAC CATHETERIZATION     > 20 years ago " everything was okay"    CERVICAL FUSION      X 2; Dr Vertell Limber   COLONOSCOPY     ENDOBRONCHIAL ULTRASOUND N/A 07/15/2019   Procedure: ENDOBRONCHIAL ULTRASOUND;  Surgeon: Rigoberto Noel, MD;  Location: [place];  Service: Cardiopulmonary;  Laterality: N/A;   FINE NEEDLE ASPIRATION  07/15/2019   Procedure: FINE NEEDLE ASPIRATION (FNA) LINEAR;  Surgeon: Rigoberto Noel, MD;  Location: [place];  Service: Cardiopulmonary;;   FINGER ARTHROPLASTY Right 04/17/2013   Procedure: IMPLANT ARTHROPLASTY RIGHT INDEX;  Surgeon: Cammie Sickle., MD;  Location: [facility];  Service: Orthopedics;  Laterality: Right;   LUMBAR LAMINECTOMY  2010   Dr Becky Sax   MELANOMA EXCISION  2003   L ear   THYROIDECTOMY  1971   cytology indefinite   TONSILLECTOMY     TOTAL KNEE ARTHROPLASTY  2004   left   VIDEO BRONCHOSCOPY  N/A 07/15/2019   Procedure: VIDEO BRONCHOSCOPY WITHOUT FLUORO;  Surgeon: Rigoberto Noel, MD;  Location: [place];  Service: Cardiopulmonary;  Laterality: N/A;   WISDOM TOOTH EXTRACTION         Family History  Problem Relation Age of Onset   Breast cancer Mother    Alzheimer's disease Father    Breast cancer Maternal Grandmother    Breast cancer Sister    Cancer Sister    Diabetes Neg Hx    Stroke Neg Hx    Heart disease Neg Hx     Social History   Tobacco Use   Smoking status: Former    Packs/day: 0.75    Years: 41.00    Pack years: 30.75    Types: Cigarettes    Quit date: 02/13/1993    Years since quitting:  27.7   Smokeless tobacco: Never   Tobacco comments:    smoked 1956-1995, up to 1/2 ppd  Vaping Use   Vaping Use: Never used  Substance Use Topics   Alcohol use: Yes    Comment:  Vodka 2 oz/ night   Drug use: No    Home Medications Prior to Admission medications   Medication Sig Start Date End Date Taking? Authorizing Provider  Biotin w/ Vitamins C & E (HAIR/SKIN/NAILS PO) Chew 2 tablets by mouth daily.    [provider]  clindamycin (CLEOCIN) 300 MG capsule Take 300 mg by mouth 3 (three) times daily. 10/19/20   [provider]  Glycerin-Hypromellose-PEG 400 (DRY EYE RELIEF DROPS) 0.2-0.2-1 % SOLN Place 1 drop into both eyes daily as needed (Dry eye).    [provider]  Ibuprofen-diphenhydrAMINE Cit (ADVIL PM PO) Take by mouth at bedtime as needed.    [provider]  imiquimod (ALDARA) 5 % cream SMARTSIG:1 Packet(s) Topical Every Night Patient not taking: No sig reported 11/06/19   [provider]  levothyroxine (SYNTHROID) 175 MCG tablet Take 175 mcg by mouth daily before breakfast.    [provider]  mineral oil liquid Take 15 mLs by mouth daily as needed for moderate constipation.    [provider]  tamsulosin (FLOMAX) 0.4 MG CAPS capsule TAKE 1 CAPSULE BY MOUTH DAILY AFTER SUPPER 08/05/20   Plotnikov, Evie Lacks, MD  warfarin (COUMADIN) 5 MG tablet TAKE 1 TABLET DAILY OR AS DIRECTED BY ANTICOAGULATION CLINIC Patient taking differently: 10/21/2020 Takes 7.25 mg on M_W_F. and 5 mg all other days.. 09/02/20   Plotnikov, Evie Lacks, MD    Allergies    Patient has no known allergies.  Review of Systems   Review of Systems  All other systems reviewed and are negative.  Physical Exam Updated Vital Signs There were no vitals taken for this visit.  Physical Exam Vitals and nursing note reviewed.  Constitutional:      Appearance: He is well-developed.  HENT:     Head: Normocephalic and atraumatic.     Right Ear: External  ear normal.     Left Ear: External ear normal.     Nose: Nose normal.  Eyes:     Extraocular Movements: Extraocular movements intact.  Neck:     Trachea: No tracheal deviation.  Pulmonary:     Effort: Pulmonary effort is normal.  Abdominal:     General: Bowel sounds are normal.     Palpations: Abdomen is soft.     Comments: Ventral hernia noted  Genitourinary:    Penis: Normal.      Comments: Foley catheter in place.  There is some blood noted on the diaper.  There is some irritation at the external meatus.  There is some pulling on the catheter due to the way it is attached to the leg.  The bag does appear to be on upside down.  However, it is draining well and the urine is yellow in the bag. Musculoskeletal:        General: Normal range of motion.  Skin:    General: Skin is warm and dry.  Neurological:     Mental Status: He is alert and oriented to person, place, and time.  Psychiatric:        Mood and Affect: Mood normal.        Behavior: Behavior normal.    ED Results / Procedures / Treatments   Labs (all labs ordered are listed, but only abnormal results are displayed) Labs Reviewed - No data to display  EKG None  Radiology No results found.  Procedures Procedures   Medications Ordered in ED Medications - No data to display  ED Course  I have reviewed the triage vital signs and the nursing notes.  Pertinent labs & imaging results that were available during my care of the patient were reviewed by me and considered in my medical decision making (see chart for details).    MDM Rules/Calculators/A&P                           Patient with Foley catheter placed yesterday.  There is some bleeding at the meatus.  This does not appear to be significant and is likely secondary to some irritation from the Foley catheter and patient is on blood thinners.  Plan to reanchor the Foley catheter and replace the bag.  Patient is reassured regarding being able to continue to  use the bag for a single patient until he is followed up at urology.  Patient appears stable for discharge. Final Clinical Impression(s) / ED Diagnoses Final diagnoses:  Foley catheter problem, initial encounter [facility])    Rx / DC Orders ED Discharge Orders     None        Pattricia Boss, MD 10/31/20 [phone]

## 2020-10-31 NOTE — ED Triage Notes (Signed)
He reports minimal bleeding from meatus, with some minimal urethral discomfort. He also questions if his leg-bag is upside-down (it is). He is ambulatory and in no distress.

## 2020-11-04 ENCOUNTER — Encounter: Payer: Self-pay | Admitting: Internal Medicine

## 2020-11-05 DIAGNOSIS — R972 Elevated prostate specific antigen [PSA]: Secondary | ICD-10-CM | POA: Diagnosis not present

## 2020-11-05 DIAGNOSIS — R338 Other retention of urine: Secondary | ICD-10-CM | POA: Diagnosis not present

## 2020-11-05 DIAGNOSIS — N401 Enlarged prostate with lower urinary tract symptoms: Secondary | ICD-10-CM | POA: Diagnosis not present

## 2020-11-12 DIAGNOSIS — L0889 Other specified local infections of the skin and subcutaneous tissue: Secondary | ICD-10-CM | POA: Diagnosis not present

## 2020-11-12 DIAGNOSIS — R338 Other retention of urine: Secondary | ICD-10-CM | POA: Diagnosis not present

## 2020-11-15 ENCOUNTER — Emergency Department (HOSPITAL_BASED_OUTPATIENT_CLINIC_OR_DEPARTMENT_OTHER): Payer: Medicare Other

## 2020-11-15 ENCOUNTER — Emergency Department (HOSPITAL_BASED_OUTPATIENT_CLINIC_OR_DEPARTMENT_OTHER)
Admission: EM | Admit: 2020-11-15 | Discharge: 2020-11-16 | Disposition: A | Discharge status: Home / Self Care | Payer: Medicare Other | Source: Home / Self Care | Attending: Emergency Medicine | Admitting: Emergency Medicine

## 2020-11-15 ENCOUNTER — Other Ambulatory Visit: Payer: Self-pay

## 2020-11-15 ENCOUNTER — Encounter (HOSPITAL_BASED_OUTPATIENT_CLINIC_OR_DEPARTMENT_OTHER): Payer: Self-pay

## 2020-11-15 DIAGNOSIS — Z85828 Personal history of other malignant neoplasm of skin: Secondary | ICD-10-CM | POA: Insufficient documentation

## 2020-11-15 DIAGNOSIS — Z87891 Personal history of nicotine dependence: Secondary | ICD-10-CM | POA: Insufficient documentation

## 2020-11-15 DIAGNOSIS — K5900 Constipation, unspecified: Secondary | ICD-10-CM | POA: Insufficient documentation

## 2020-11-15 DIAGNOSIS — R600 Localized edema: Secondary | ICD-10-CM | POA: Insufficient documentation

## 2020-11-15 DIAGNOSIS — Z96691 Finger-joint replacement of right hand: Secondary | ICD-10-CM | POA: Insufficient documentation

## 2020-11-15 DIAGNOSIS — Z79899 Other long term (current) drug therapy: Secondary | ICD-10-CM | POA: Insufficient documentation

## 2020-11-15 DIAGNOSIS — R339 Retention of urine, unspecified: Secondary | ICD-10-CM | POA: Diagnosis not present

## 2020-11-15 DIAGNOSIS — Z85850 Personal history of malignant neoplasm of thyroid: Secondary | ICD-10-CM | POA: Insufficient documentation

## 2020-11-15 DIAGNOSIS — E039 Hypothyroidism, unspecified: Secondary | ICD-10-CM | POA: Insufficient documentation

## 2020-11-15 DIAGNOSIS — K5641 Fecal impaction: Secondary | ICD-10-CM

## 2020-11-15 DIAGNOSIS — Z96652 Presence of left artificial knee joint: Secondary | ICD-10-CM | POA: Insufficient documentation

## 2020-11-15 DIAGNOSIS — R140 Abdominal distension (gaseous): Secondary | ICD-10-CM

## 2020-11-15 DIAGNOSIS — N390 Urinary tract infection, site not specified: Secondary | ICD-10-CM | POA: Diagnosis not present

## 2020-11-15 DIAGNOSIS — Z7901 Long term (current) use of anticoagulants: Secondary | ICD-10-CM | POA: Insufficient documentation

## 2020-11-15 DIAGNOSIS — K7689 Other specified diseases of liver: Secondary | ICD-10-CM | POA: Diagnosis not present

## 2020-11-15 DIAGNOSIS — N281 Cyst of kidney, acquired: Secondary | ICD-10-CM | POA: Diagnosis not present

## 2020-11-15 DIAGNOSIS — I700 Atherosclerosis of aorta: Secondary | ICD-10-CM | POA: Diagnosis not present

## 2020-11-15 LAB — CBC WITH DIFFERENTIAL/PLATELET
Abs Immature Granulocytes: 0.03 10*3/uL (ref 0.00–0.07)
Basophils Absolute: 0 10*3/uL (ref 0.0–0.1)
Basophils Relative: 0 %
Eosinophils Absolute: 0.1 10*3/uL (ref 0.0–0.5)
Eosinophils Relative: 1 %
HCT: 39.7 % (ref 39.0–52.0)
Hemoglobin: 12.4 g/dL — ABNORMAL LOW (ref 13.0–17.0)
Immature Granulocytes: 0 %
Lymphocytes Relative: 8 %
Lymphs Abs: 0.7 10*3/uL (ref 0.7–4.0)
MCH: 27.9 pg (ref 26.0–34.0)
MCHC: 31.2 g/dL (ref 30.0–36.0)
MCV: 89.4 fL (ref 80.0–100.0)
Monocytes Absolute: 0.5 10*3/uL (ref 0.1–1.0)
Monocytes Relative: 7 %
Neutro Abs: 6.5 10*3/uL (ref 1.7–7.7)
Neutrophils Relative %: 84 %
Platelets: 199 10*3/uL (ref 150–400)
RBC: 4.44 MIL/uL (ref 4.22–5.81)
RDW: 13.8 % (ref 11.5–15.5)
WBC: 7.8 10*3/uL (ref 4.0–10.5)
nRBC: 0 % (ref 0.0–0.2)

## 2020-11-15 LAB — URINALYSIS, ROUTINE W REFLEX MICROSCOPIC
Bilirubin Urine: NEGATIVE
Glucose, UA: NEGATIVE mg/dL
Hgb urine dipstick: NEGATIVE
Ketones, ur: NEGATIVE mg/dL
Nitrite: NEGATIVE
Protein, ur: NEGATIVE mg/dL
Specific Gravity, Urine: 1.011 (ref 1.005–1.030)
pH: 6.5 (ref 5.0–8.0)

## 2020-11-15 LAB — HEPATIC FUNCTION PANEL
ALT: 16 U/L (ref 0–44)
AST: 22 U/L (ref 15–41)
Albumin: 4.1 g/dL (ref 3.5–5.0)
Alkaline Phosphatase: 97 U/L (ref 38–126)
Bilirubin, Direct: 0.2 mg/dL (ref 0.0–0.2)
Indirect Bilirubin: 0.6 mg/dL (ref 0.3–0.9)
Total Bilirubin: 0.8 mg/dL (ref 0.3–1.2)
Total Protein: 7.4 g/dL (ref 6.5–8.1)

## 2020-11-15 LAB — BASIC METABOLIC PANEL
Anion gap: 9 (ref 5–15)
BUN: 17 mg/dL (ref 8–23)
CO2: 28 mmol/L (ref 22–32)
Calcium: 9.2 mg/dL (ref 8.9–10.3)
Chloride: 100 mmol/L (ref 98–111)
Creatinine, Ser: 1.14 mg/dL (ref 0.61–1.24)
GFR, Estimated: >60 mL/min (ref >60)
Glucose, Bld: 111 mg/dL — ABNORMAL HIGH (ref 70–99)
Potassium: 4.2 mmol/L (ref 3.5–5.1)
Sodium: 137 mmol/L (ref 135–145)

## 2020-11-15 LAB — LIPASE, BLOOD: Lipase: 14 U/L (ref 11–51)

## 2020-11-15 MED ORDER — SODIUM CHLORIDE 0.9 % IV BOLUS
500.0000 mL | Freq: Once | INTRAVENOUS | Status: AC
  Administered 2020-11-15: 500 mL via INTRAVENOUS

## 2020-11-15 MED ORDER — IOHEXOL 350 MG/ML SOLN
100.0000 mL | Freq: Once | INTRAVENOUS | Status: AC | PRN
  Administered 2020-11-15: 60 mL via INTRAVENOUS

## 2020-11-15 MED ORDER — SODIUM CHLORIDE 0.9 % IV SOLN: INTRAVENOUS | Status: DC

## 2020-11-15 NOTE — Discharge Instructions (Addendum)
The Foley catheter in place.  Make an appointment to follow back up with urology.  Probably can keep your appointment that you have for Wednesday.  Recommend frequent use of MiraLAX at home to treat for constipation.  Also recommend using the suppositories prescribed as needed.  Home enemas can also be used.

## 2020-11-15 NOTE — ED Provider Notes (Signed)
[facility] EMERGENCY DEPT Provider Note   CSN: [id number] Arrival date & time: 11/15/20  0973     History Chief Complaint  Patient presents with   Urinary Retention    Clarence Hopkins is a 87 y.o. male.  Patient initially with concerns for urinary retention.  Patient had Foley catheter inserted on September 18.  Had it removed about 5 days ago by urology.  Followed by Dr. Jeffie Pollock.  Catheter was placed for urinary retention.  Patient started to have urinary retention again today.  Unable to void.  Patient also has talks about abdominal distention and constipation and crampy abdominal pain for a while.  Patient has history of pulmonary embolism he is on Coumadin permanently.  Also has hypothyroidism.  Thyroid cancer.  And skin cancer to the lower extremities.  Patient lives at [place].      Past Medical History:  Diagnosis Date   Arthritis    Cancer ([place]) 2003   melanoma  L ear   GERD (gastroesophageal reflux disease)    Hypothyroidism    post op   Lung mass    Pulmonary embolism ([place]) 2005 & 2007   protein C deficiency    Patient Active Problem List   Diagnosis Date Noted   Multifocal pneumonia 08/04/2020   Right lower lobe lung mass    DOE (dyspnea on exertion) 06/03/2019   Near syncope 06/03/2019   Dysphagia 03/07/2018   Thyroid cancer ([place]) 02/10/2016   Medicare annual wellness visit, subsequent 02/02/2015   Hypertriglyceridemia 12/22/2013   Encounter for therapeutic drug monitoring 04/09/2013   History of basal cell cancer 10/30/2012   Long term (current) use of anticoagulants 05/23/2010   Protein C deficiency ([place]) 11/05/2009   Post-surgical hypothyroidism 01/13/2008   Elevated PSA measurement 01/13/2008   Melanoma ([place]) 01/13/2008   DISPLCMT CERV INTERVERT [place] WITHOUT MYELOPATHY 02/15/2007   PULMONARY EMBOLISM, HX OF 11/09/2006    Past Surgical History:  Procedure Laterality Date   BRONCHIAL BRUSHINGS  07/15/2019   Procedure: BRONCHIAL  BRUSHINGS;  Surgeon: Rigoberto Noel, MD;  Location: [facility];  Service: Cardiopulmonary;;   BRONCHIAL WASHINGS  07/15/2019   Procedure: BRONCHIAL WASHINGS;  Surgeon: Rigoberto Noel, MD;  [facility];  Service: Cardiopulmonary;;   CARDIAC CATHETERIZATION     > 20 years ago " everything was okay"    CERVICAL FUSION      X 2; Dr Vertell Limber   COLONOSCOPY     ENDOBRONCHIAL ULTRASOUND N/A 07/15/2019   Procedure: ENDOBRONCHIAL ULTRASOUND;  Surgeon: Rigoberto Noel, MD;  Location: [facility];  Service: Cardiopulmonary;  Laterality: N/A;   FINE NEEDLE ASPIRATION  07/15/2019   Procedure: FINE NEEDLE ASPIRATION (FNA) LINEAR;  Surgeon: Rigoberto Noel, MD;  Location: [facility];  Service: Cardiopulmonary;;   FINGER ARTHROPLASTY Right 04/17/2013   Procedure: IMPLANT ARTHROPLASTY RIGHT INDEX;  Surgeon: Cammie Sickle., MD;  Location: [facility];  Service: Orthopedics;  Laterality: Right;   LUMBAR LAMINECTOMY  2010   Dr Becky Sax   MELANOMA EXCISION  2003   L ear   THYROIDECTOMY  1971   cytology indefinite   TONSILLECTOMY     TOTAL KNEE ARTHROPLASTY  2004   left   VIDEO BRONCHOSCOPY N/A 07/15/2019   Procedure: VIDEO BRONCHOSCOPY WITHOUT FLUORO;  Surgeon: Rigoberto Noel, MD;  Location: [facility];  Service: Cardiopulmonary;  Laterality: N/A;   WISDOM TOOTH EXTRACTION         Family History  Problem Relation Age of  Onset   Breast cancer Mother    Alzheimer's disease Father    Breast cancer Maternal Grandmother    Breast cancer Sister    Cancer Sister    Diabetes Neg Hx    Stroke Neg Hx    Heart disease Neg Hx     Social History   Tobacco Use   Smoking status: Former    Packs/day: 0.75    Years: 41.00    Pack years: 30.75    Types: Cigarettes    Quit date: 02/13/1993    Years since quitting: 27.7   Smokeless tobacco: Never   Tobacco comments:    smoked 1956-1995, up to 1/2 ppd  Vaping Use   Vaping Use: Never used  Substance Use Topics   Alcohol use: Yes     Comment:  Vodka 2 oz/ night   Drug use: No    Home Medications Prior to Admission medications   Medication Sig Start Date End Date Taking? Authorizing Provider  Biotin w/ Vitamins C & E (HAIR/SKIN/NAILS PO) Chew 2 tablets by mouth daily.    [provider]  clindamycin (CLEOCIN) 300 MG capsule Take 300 mg by mouth 3 (three) times daily. 10/19/20   [provider]  Glycerin-Hypromellose-PEG 400 (DRY EYE RELIEF DROPS) 0.2-0.2-1 % SOLN Place 1 drop into both eyes daily as needed (Dry eye).    [provider]  Ibuprofen-diphenhydrAMINE Cit (ADVIL PM PO) Take by mouth at bedtime as needed.    [provider]  imiquimod (ALDARA) 5 % cream SMARTSIG:1 Packet(s) Topical Every Night Patient not taking: No sig reported 11/06/19   [provider]  levothyroxine (SYNTHROID) 175 MCG tablet Take 175 mcg by mouth daily before breakfast.    [provider]  mineral oil liquid Take 15 mLs by mouth daily as needed for moderate constipation.    [provider]  tamsulosin (FLOMAX) 0.4 MG CAPS capsule TAKE 1 CAPSULE BY MOUTH DAILY AFTER SUPPER 08/05/20   Plotnikov, Evie Lacks, MD  warfarin (COUMADIN) 5 MG tablet TAKE 1 TABLET DAILY OR AS DIRECTED BY ANTICOAGULATION CLINIC Patient taking differently: 10/21/2020 Takes 7.25 mg on M_W_F. and 5 mg all other days.. 09/02/20   Plotnikov, Evie Lacks, MD    Allergies    Patient has no known allergies.  Review of Systems   Review of Systems  Constitutional:  Negative for chills and fever.  HENT:  Negative for ear pain and sore throat.   Eyes:  Negative for pain and visual disturbance.  Respiratory:  Negative for cough and shortness of breath.   Cardiovascular:  Negative for chest pain and palpitations.  Gastrointestinal:  Positive for abdominal distention, abdominal pain and constipation. Negative for vomiting.  Genitourinary:  Positive for decreased urine volume and difficulty urinating. Negative for dysuria and  hematuria.  Musculoskeletal:  Negative for arthralgias and back pain.  Skin:  Negative for color change and rash.  Neurological:  Negative for seizures and syncope.  All other systems reviewed and are negative.  Physical Exam Updated Vital Signs BP (!) 140/98   Pulse 66   Temp 98 F (36.7 C) (Oral)   Resp 18   Ht 1.905 m (6\' 3" )   Wt 96.6 kg   SpO2 95%   BMI 26.62 kg/m   Physical Exam Vitals and nursing note reviewed.  Constitutional:      Appearance: He is well-developed.  HENT:     Head: Normocephalic and atraumatic.  Eyes:     Extraocular Movements: Extraocular  movements intact.     Conjunctiva/sclera: Conjunctivae normal.     Pupils: Pupils are equal, round, and reactive to light.  Cardiovascular:     Rate and Rhythm: Normal rate and regular rhythm.     Heart sounds: No murmur heard. Pulmonary:     Effort: Pulmonary effort is normal. No respiratory distress.     Breath sounds: Normal breath sounds.  Abdominal:     General: There is distension.     Palpations: Abdomen is soft.     Tenderness: There is no abdominal tenderness. There is no guarding.     Comments: Abdomen distended.  This is after placement of Foley catheter.  Genitourinary:    Comments: Foley catheter in place.  Urine clear. Musculoskeletal:     Cervical back: Neck supple.     Right lower leg: Edema present.     Left lower leg: Edema present.     Comments: Trace edema.  Several skin scabs.  No evidence of anything with infection.  Skin cancer removal with bandage in place on the left lateral leg area.  Skin:    General: Skin is warm and dry.  Neurological:     General: No focal deficit present.     Mental Status: He is alert and oriented to person, place, and time.    ED Results / Procedures / Treatments   Labs (all labs ordered are listed, but only abnormal results are displayed) Labs Reviewed  URINALYSIS, ROUTINE W REFLEX MICROSCOPIC - Abnormal; Notable for the following components:       Result Value   Leukocytes,Ua TRACE (*)    All other components within normal limits  BASIC METABOLIC PANEL - Abnormal; Notable for the following components:   Glucose, Bld 111 (*)    All other components within normal limits  CBC WITH DIFFERENTIAL/PLATELET - Abnormal; Notable for the following components:   Hemoglobin 12.4 (*)    All other components within normal limits  HEPATIC FUNCTION PANEL  LIPASE, BLOOD    EKG None  Radiology No results found.  Procedures Procedures   Medications Ordered in ED Medications  0.9 %  sodium chloride infusion (has no administration in time range)  sodium chloride 0.9 % bolus 500 mL (has no administration in time range)  iohexol (OMNIPAQUE) 350 MG/ML injection 100 mL (has no administration in time range)    ED Course  I have reviewed the triage vital signs and the nursing notes.  Pertinent labs & imaging results that were available during my care of the patient were reviewed by me and considered in my medical decision making (see chart for details).    MDM Rules/Calculators/A&P                          Bladder scan showed a large amount of urine.  Foley placed.  Large amount of urine out.  Renal function normal.  Electrolytes normal.  No leukocytosis.  Hemoglobin 12.4.  Urinalysis not consistent with urinary tract infection.  Patient with the abdominal distention and crampy abdominal pain preceding this urinary retention problem.  Patient thinks its constipation.  Will get CT scan of the abdomen add on hepatic test as well as lipase.  And add on INR since he is on Coumadin.  CT scan and extra labs have no acute findings.  Patient keep the Foley catheter and follow back up with urology.  If it shows constipation.  Would recommend MiraLAX 4 times a day.  This would be safe.  Final Clinical Impression(s) / ED Diagnoses Final diagnoses:  Urinary retention  Abdominal distention    Rx / DC Orders ED Discharge Orders     None         Fredia Sorrow, MD 11/15/20 2246

## 2020-11-15 NOTE — ED Triage Notes (Signed)
Patient here POV from Home with Urinary Symptoms.  Patient states he had a Foley Catheter inserted 10/31/2020. Patient had Follow-Up with Urologist approximately 5-6 days in which he had Foley Catheter removed.   Since then, the Patient has had Urinary Frequency with Minimal Emptying.   NAD Noted during Triage. BIB Wheelchair. A&Ox4. GCS 15.

## 2020-11-16 MED ORDER — BISACODYL 10 MG RE SUPP: 10.0000 mg | RECTAL | 0 refills | Status: DC | PRN

## 2020-11-16 NOTE — ED Provider Notes (Signed)
  Provider Note MRN:  [id number]  Arrival date & time: 11/16/20    ED Course and Medical Decision Making  Assumed care from Dr. Rogene Houston at shift change.  Urinary retention, continued pain after Foley catheter placement, awaiting CT.  CT revealing metastatic findings that are known to the patient.  Also with some renal cysts.  There is comment of large stool burden.  Upon further questioning, patient does feel like he has a large stool that he cannot pass.  Fecal disimpaction performed, patient feeling a lot better, appropriate for discharge on bowel regimen.  Fecal disimpaction  Date/Time: 11/16/2020 12:55 AM Performed by: Maudie Flakes, MD Authorized by: Maudie Flakes, MD  Consent: Verbal consent obtained. Risks and benefits: risks, benefits and alternatives were discussed Consent given by: patient Patient understanding: patient states understanding of the procedure being performed Imaging studies: imaging studies available Time out: Immediately prior to procedure a "time out" was called to verify the correct patient, procedure, equipment, support staff and site/side marked as required. Preparation: Patient was prepped and draped in the usual sterile fashion. Local anesthesia used: no  Anesthesia: Local anesthesia used: no  Sedation: Patient sedated: no  Patient tolerance: patient tolerated the procedure well with no immediate complications    Final Clinical Impressions(s) / ED Diagnoses     ICD-10-CM   1. Urinary retention  R33.9     2. Abdominal distention  R14.0     3. Fecal impaction ([place])  K56.41       ED Discharge Orders          Ordered    bisacodyl (DULCOLAX) 10 MG suppository  As needed        11/16/20 0054              Discharge Instructions      The Foley catheter in place.  Make an appointment to follow back up with urology.  Probably can keep your appointment that you have for Wednesday.  Recommend frequent use of MiraLAX at home to  treat for constipation.  Also recommend using the suppositories prescribed as needed.  Home enemas can also be used.    Barth Kirks. [place] Small, [place] [email or web address]    Maudie Flakes, MD 11/16/20 [phone]

## 2020-11-17 ENCOUNTER — Telehealth: Payer: Self-pay

## 2020-11-17 ENCOUNTER — Encounter (HOSPITAL_COMMUNITY): Payer: Self-pay

## 2020-11-17 ENCOUNTER — Inpatient Hospital Stay (HOSPITAL_COMMUNITY)
Admission: EM | Admit: 2020-11-17 | Discharge: 2020-11-24 | DRG: 813 | Disposition: A | Discharge status: Home Health | Payer: Medicare Other | Attending: Internal Medicine | Admitting: Internal Medicine

## 2020-11-17 DIAGNOSIS — Z960 Presence of urogenital implants: Secondary | ICD-10-CM | POA: Diagnosis present

## 2020-11-17 DIAGNOSIS — Z86718 Personal history of other venous thrombosis and embolism: Secondary | ICD-10-CM

## 2020-11-17 DIAGNOSIS — K625 Hemorrhage of anus and rectum: Secondary | ICD-10-CM | POA: Diagnosis present

## 2020-11-17 DIAGNOSIS — E781 Pure hyperglyceridemia: Secondary | ICD-10-CM | POA: Diagnosis present

## 2020-11-17 DIAGNOSIS — N401 Enlarged prostate with lower urinary tract symptoms: Secondary | ICD-10-CM | POA: Diagnosis not present

## 2020-11-17 DIAGNOSIS — M1990 Unspecified osteoarthritis, unspecified site: Secondary | ICD-10-CM | POA: Diagnosis present

## 2020-11-17 DIAGNOSIS — I480 Paroxysmal atrial fibrillation: Secondary | ICD-10-CM | POA: Diagnosis present

## 2020-11-17 DIAGNOSIS — Z85828 Personal history of other malignant neoplasm of skin: Secondary | ICD-10-CM

## 2020-11-17 DIAGNOSIS — L8991 Pressure ulcer of unspecified site, stage 1: Secondary | ICD-10-CM | POA: Diagnosis present

## 2020-11-17 DIAGNOSIS — R338 Other retention of urine: Secondary | ICD-10-CM | POA: Diagnosis present

## 2020-11-17 DIAGNOSIS — I10 Essential (primary) hypertension: Secondary | ICD-10-CM | POA: Diagnosis not present

## 2020-11-17 DIAGNOSIS — R791 Abnormal coagulation profile: Secondary | ICD-10-CM

## 2020-11-17 DIAGNOSIS — K921 Melena: Secondary | ICD-10-CM | POA: Diagnosis present

## 2020-11-17 DIAGNOSIS — R3129 Other microscopic hematuria: Secondary | ICD-10-CM | POA: Diagnosis not present

## 2020-11-17 DIAGNOSIS — R1084 Generalized abdominal pain: Secondary | ICD-10-CM | POA: Diagnosis not present

## 2020-11-17 DIAGNOSIS — N320 Bladder-neck obstruction: Secondary | ICD-10-CM | POA: Diagnosis present

## 2020-11-17 DIAGNOSIS — E785 Hyperlipidemia, unspecified: Secondary | ICD-10-CM | POA: Diagnosis present

## 2020-11-17 DIAGNOSIS — Z87440 Personal history of urinary (tract) infections: Secondary | ICD-10-CM

## 2020-11-17 DIAGNOSIS — C7801 Secondary malignant neoplasm of right lung: Secondary | ICD-10-CM | POA: Diagnosis present

## 2020-11-17 DIAGNOSIS — Z86711 Personal history of pulmonary embolism: Secondary | ICD-10-CM

## 2020-11-17 DIAGNOSIS — D6832 Hemorrhagic disorder due to extrinsic circulating anticoagulants: Principal | ICD-10-CM | POA: Diagnosis present

## 2020-11-17 DIAGNOSIS — T8389XA Other specified complication of genitourinary prosthetic devices, implants and grafts, initial encounter: Secondary | ICD-10-CM | POA: Diagnosis present

## 2020-11-17 DIAGNOSIS — R9431 Abnormal electrocardiogram [ECG] [EKG]: Secondary | ICD-10-CM | POA: Diagnosis not present

## 2020-11-17 DIAGNOSIS — D500 Iron deficiency anemia secondary to blood loss (chronic): Secondary | ICD-10-CM | POA: Diagnosis present

## 2020-11-17 DIAGNOSIS — T45515A Adverse effect of anticoagulants, initial encounter: Secondary | ICD-10-CM | POA: Diagnosis present

## 2020-11-17 DIAGNOSIS — R3982 Chronic bladder pain: Secondary | ICD-10-CM | POA: Diagnosis not present

## 2020-11-17 DIAGNOSIS — R5383 Other fatigue: Secondary | ICD-10-CM | POA: Diagnosis present

## 2020-11-17 DIAGNOSIS — K594 Anal spasm: Secondary | ICD-10-CM | POA: Diagnosis present

## 2020-11-17 DIAGNOSIS — Z820 Family history of epilepsy and other diseases of the nervous system: Secondary | ICD-10-CM

## 2020-11-17 DIAGNOSIS — R410 Disorientation, unspecified: Secondary | ICD-10-CM | POA: Diagnosis present

## 2020-11-17 DIAGNOSIS — Z79899 Other long term (current) drug therapy: Secondary | ICD-10-CM

## 2020-11-17 DIAGNOSIS — Z96652 Presence of left artificial knee joint: Secondary | ICD-10-CM | POA: Diagnosis present

## 2020-11-17 DIAGNOSIS — D6859 Other primary thrombophilia: Secondary | ICD-10-CM | POA: Diagnosis present

## 2020-11-17 DIAGNOSIS — Z79890 Hormone replacement therapy (postmenopausal): Secondary | ICD-10-CM

## 2020-11-17 DIAGNOSIS — L8990 Pressure ulcer of unspecified site, unspecified stage: Secondary | ICD-10-CM | POA: Insufficient documentation

## 2020-11-17 DIAGNOSIS — Z87891 Personal history of nicotine dependence: Secondary | ICD-10-CM

## 2020-11-17 DIAGNOSIS — Z7901 Long term (current) use of anticoagulants: Secondary | ICD-10-CM

## 2020-11-17 DIAGNOSIS — K5641 Fecal impaction: Secondary | ICD-10-CM | POA: Diagnosis present

## 2020-11-17 DIAGNOSIS — Z85820 Personal history of malignant melanoma of skin: Secondary | ICD-10-CM

## 2020-11-17 DIAGNOSIS — E890 Postprocedural hypothyroidism: Secondary | ICD-10-CM | POA: Diagnosis present

## 2020-11-17 DIAGNOSIS — Z48817 Encounter for surgical aftercare following surgery on the skin and subcutaneous tissue: Secondary | ICD-10-CM

## 2020-11-17 DIAGNOSIS — K7689 Other specified diseases of liver: Secondary | ICD-10-CM | POA: Diagnosis present

## 2020-11-17 DIAGNOSIS — K219 Gastro-esophageal reflux disease without esophagitis: Secondary | ICD-10-CM | POA: Diagnosis present

## 2020-11-17 DIAGNOSIS — Z85850 Personal history of malignant neoplasm of thyroid: Secondary | ICD-10-CM

## 2020-11-17 DIAGNOSIS — R58 Hemorrhage, not elsewhere classified: Secondary | ICD-10-CM | POA: Diagnosis not present

## 2020-11-17 DIAGNOSIS — R0902 Hypoxemia: Secondary | ICD-10-CM | POA: Diagnosis not present

## 2020-11-17 DIAGNOSIS — Z20822 Contact with and (suspected) exposure to covid-19: Secondary | ICD-10-CM | POA: Diagnosis present

## 2020-11-17 DIAGNOSIS — Y846 Urinary catheterization as the cause of abnormal reaction of the patient, or of later complication, without mention of misadventure at the time of the procedure: Secondary | ICD-10-CM | POA: Diagnosis present

## 2020-11-17 DIAGNOSIS — Z981 Arthrodesis status: Secondary | ICD-10-CM

## 2020-11-17 DIAGNOSIS — Z803 Family history of malignant neoplasm of breast: Secondary | ICD-10-CM

## 2020-11-17 DIAGNOSIS — C7802 Secondary malignant neoplasm of left lung: Secondary | ICD-10-CM | POA: Diagnosis present

## 2020-11-17 LAB — POC OCCULT BLOOD, ED: Fecal Occult Bld: POSITIVE — AB

## 2020-11-17 LAB — CBC WITH DIFFERENTIAL/PLATELET
Abs Immature Granulocytes: 0.03 10*3/uL (ref 0.00–0.07)
Basophils Absolute: 0 10*3/uL (ref 0.0–0.1)
Basophils Relative: 0 %
Eosinophils Absolute: 0.1 10*3/uL (ref 0.0–0.5)
Eosinophils Relative: 1 %
HCT: 40.1 % (ref 39.0–52.0)
Hemoglobin: 12.6 g/dL — ABNORMAL LOW (ref 13.0–17.0)
Immature Granulocytes: 0 %
Lymphocytes Relative: 16 %
Lymphs Abs: 1.3 10*3/uL (ref 0.7–4.0)
MCH: 28.3 pg (ref 26.0–34.0)
MCHC: 31.4 g/dL (ref 30.0–36.0)
MCV: 89.9 fL (ref 80.0–100.0)
Monocytes Absolute: 0.7 10*3/uL (ref 0.1–1.0)
Monocytes Relative: 9 %
Neutro Abs: 5.7 10*3/uL (ref 1.7–7.7)
Neutrophils Relative %: 74 %
Platelets: 198 10*3/uL (ref 150–400)
RBC: 4.46 MIL/uL (ref 4.22–5.81)
RDW: 13.7 % (ref 11.5–15.5)
WBC: 7.8 10*3/uL (ref 4.0–10.5)
nRBC: 0 % (ref 0.0–0.2)

## 2020-11-17 LAB — COMPREHENSIVE METABOLIC PANEL
ALT: 17 U/L (ref 0–44)
AST: 29 U/L (ref 15–41)
Albumin: 3.8 g/dL (ref 3.5–5.0)
Alkaline Phosphatase: 109 U/L (ref 38–126)
Anion gap: 5 (ref 5–15)
BUN: 17 mg/dL (ref 8–23)
CO2: 28 mmol/L (ref 22–32)
Calcium: 9 mg/dL (ref 8.9–10.3)
Chloride: 100 mmol/L (ref 98–111)
Creatinine, Ser: 1.16 mg/dL (ref 0.61–1.24)
GFR, Estimated: >60 mL/min (ref >60)
Glucose, Bld: 96 mg/dL (ref 70–99)
Potassium: 4.1 mmol/L (ref 3.5–5.1)
Sodium: 133 mmol/L — ABNORMAL LOW (ref 135–145)
Total Bilirubin: 1.2 mg/dL (ref 0.3–1.2)
Total Protein: 7.3 g/dL (ref 6.5–8.1)

## 2020-11-17 LAB — TYPE AND SCREEN
ABO/RH(D): A POS
Antibody Screen: NEGATIVE

## 2020-11-17 LAB — PROTIME-INR
INR: 4.8 (ref 0.8–1.2)
Prothrombin Time: 45.2 seconds — ABNORMAL HIGH (ref 11.4–15.2)

## 2020-11-17 LAB — CBG MONITORING, ED: Glucose-Capillary: 83 mg/dL (ref 70–99)

## 2020-11-17 MED ORDER — FENTANYL CITRATE PF 50 MCG/ML IJ SOSY
50.0000 ug | PREFILLED_SYRINGE | Freq: Once | INTRAMUSCULAR | Status: AC
  Administered 2020-11-17: 50 ug via INTRAVENOUS
  Filled 2020-11-17: qty 1

## 2020-11-17 MED ORDER — ONDANSETRON HCL 4 MG/2ML IJ SOLN
4.0000 mg | Freq: Once | INTRAMUSCULAR | Status: AC
  Administered 2020-11-17: 4 mg via INTRAVENOUS
  Filled 2020-11-17: qty 2

## 2020-11-17 MED ORDER — PHYTONADIONE 5 MG PO TABS
2.5000 mg | ORAL_TABLET | Freq: Once | ORAL | Status: AC
  Administered 2020-11-17: 2.5 mg via ORAL
  Filled 2020-11-17 (×2): qty 1

## 2020-11-17 NOTE — Telephone Encounter (Signed)
Collie Siad called to report pt has been put on Levaquin for 7 days and started 2 days ago, has 5 days remaining.   Advised to reduce warfarin dose 10/5: take 1 tablet 10/6: take 1/2 tablet 10/7: take 1 tablet 10/8: take 1/2 tablet 10/9: take 1/2 tablet 10/10 Recheck INR  Advised of dosing above and if any changes to contact this nurse. Collie Siad verbalized understanding.

## 2020-11-17 NOTE — ED Provider Notes (Signed)
Clarence Hopkins Provider Note   CSN: [id number] Arrival date & time: 11/17/20  7322     History Chief Complaint  Patient presents with   Rectal Bleeding    Clarence Hopkins is a 87 y.o. male.  Patient with history of PE on chronic anticoagulation with Coumadin, metastatic thyroid cancer, urinary retention with current indwelling Foley catheter, recently started on Levaquin --presents the emergency department today for fresh blood noted in the stool this afternoon around 4 PM.  Patient was seen in the emergency department 2 days ago for abdominal pain and constipation.  He was found to have recurrent urinary retention.  He had a CT scan without any emergent findings but did have a fecal impaction.  Catheter was replaced and disimpaction was performed.  He reports ongoing intermittent rectal pain, 3-4 times per hour, lasting about 20 seconds.  No history of blood in stool.  The onset of this condition was acute. Aggravating factors: none. Alleviating factors: none.        Past Medical History:  Diagnosis Date   Arthritis    Cancer ([place]) 2003   melanoma  L ear   GERD (gastroesophageal reflux disease)    Hypothyroidism    post op   Lung mass    Pulmonary embolism ([place]) 2005 & 2007   protein C deficiency    Patient Active Problem List   Diagnosis Date Noted   Multifocal pneumonia 08/04/2020   Right lower lobe lung mass    DOE (dyspnea on exertion) 06/03/2019   Near syncope 06/03/2019   Dysphagia 03/07/2018   Thyroid cancer ([place]) 02/10/2016   Medicare annual wellness visit, subsequent 02/02/2015   Hypertriglyceridemia 12/22/2013   Encounter for therapeutic drug monitoring 04/09/2013   History of basal cell cancer 10/30/2012   Long term (current) use of anticoagulants 05/23/2010   Protein C deficiency ([place]) 11/05/2009   Post-surgical hypothyroidism 01/13/2008   Elevated PSA measurement 01/13/2008   Melanoma ([place]) 01/13/2008   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 02/15/2007   PULMONARY EMBOLISM, HX OF 11/09/2006    Past Surgical History:  Procedure Laterality Date   BRONCHIAL BRUSHINGS  07/15/2019   Procedure: BRONCHIAL BRUSHINGS;  Surgeon: Rigoberto Noel, MD;  Location: [facility];  Service: Cardiopulmonary;;   BRONCHIAL WASHINGS  07/15/2019   Procedure: BRONCHIAL WASHINGS;  Surgeon: Rigoberto Noel, MD;  Location: [facility];  Service: Cardiopulmonary;;   CARDIAC CATHETERIZATION     > 20 years ago " everything was okay"    CERVICAL FUSION      X 2; Dr Vertell Limber   COLONOSCOPY     ENDOBRONCHIAL ULTRASOUND N/A 07/15/2019   Procedure: ENDOBRONCHIAL ULTRASOUND;  Surgeon: Rigoberto Noel, MD;  Location: [place];  Service: Cardiopulmonary;  Laterality: N/A;   FINE NEEDLE ASPIRATION  07/15/2019   Procedure: FINE NEEDLE ASPIRATION (FNA) LINEAR;  Surgeon: Rigoberto Noel, MD;  Location: [facility];  Service: Cardiopulmonary;;   FINGER ARTHROPLASTY Right 04/17/2013   Procedure: IMPLANT ARTHROPLASTY RIGHT INDEX;  Surgeon: Cammie Sickle., MD;  Location: [facility];  Service: Orthopedics;  Laterality: Right;   LUMBAR LAMINECTOMY  2010   Dr Becky Sax   MELANOMA EXCISION  2003   L ear   THYROIDECTOMY  1971   cytology indefinite   TONSILLECTOMY     TOTAL KNEE ARTHROPLASTY  2004   left   VIDEO BRONCHOSCOPY N/A 07/15/2019   Procedure: VIDEO BRONCHOSCOPY WITHOUT FLUORO;  Surgeon: Rigoberto Noel, MD;  Location: [facility];  Service: Cardiopulmonary;  Laterality: N/A;   WISDOM TOOTH EXTRACTION         Family History  Problem Relation Age of Onset   Breast cancer Mother    Alzheimer's disease Father    Breast cancer Maternal Grandmother    Breast cancer Sister    Cancer Sister    Diabetes Neg Hx    Stroke Neg Hx    Heart disease Neg Hx     Social History   Tobacco Use   Smoking status: Former    Packs/day: 0.75    Years: 41.00    Pack years: 30.75    Types: Cigarettes    Quit date: 02/13/1993     Years since quitting: 27.7   Smokeless tobacco: Never   Tobacco comments:    smoked 1956-1995, up to 1/2 ppd  Vaping Use   Vaping Use: Never used  Substance Use Topics   Alcohol use: Yes    Comment:  Vodka 2 oz/ night   Drug use: No    Home Medications Prior to Admission medications   Medication Sig Start Date End Date Taking? Authorizing Provider  Biotin w/ Vitamins C & E (HAIR/SKIN/NAILS PO) Chew 2 tablets by mouth daily.    [provider]  bisacodyl (DULCOLAX) 10 MG suppository Place 1 suppository (10 mg total) rectally as needed for moderate constipation. 11/16/20   Maudie Flakes, MD  clindamycin (CLEOCIN) 300 MG capsule Take 300 mg by mouth 3 (three) times daily. 10/19/20   [provider]  Glycerin-Hypromellose-PEG 400 (DRY EYE RELIEF DROPS) 0.2-0.2-1 % SOLN Place 1 drop into both eyes daily as needed (Dry eye).    [provider]  Ibuprofen-diphenhydrAMINE Cit (ADVIL PM PO) Take by mouth at bedtime as needed.    [provider]  imiquimod (ALDARA) 5 % cream SMARTSIG:1 Packet(s) Topical Every Night Patient not taking: No sig reported 11/06/19   [provider]  levothyroxine (SYNTHROID) 175 MCG tablet Take 175 mcg by mouth daily before breakfast.    [provider]  mineral oil liquid Take 15 mLs by mouth daily as needed for moderate constipation.    [provider]  tamsulosin (FLOMAX) 0.4 MG CAPS capsule TAKE 1 CAPSULE BY MOUTH DAILY AFTER SUPPER 08/05/20   Plotnikov, Evie Lacks, MD  warfarin (COUMADIN) 5 MG tablet TAKE 1 TABLET DAILY OR AS DIRECTED BY ANTICOAGULATION CLINIC Patient taking differently: 10/21/2020 Takes 7.25 mg on M_W_F. and 5 mg all other days.. 09/02/20   Plotnikov, Evie Lacks, MD    Allergies    Patient has no known allergies.  Review of Systems   Review of Systems  Constitutional:  Negative for fever.  HENT:  Negative for rhinorrhea and sore throat.   Eyes:  Negative for redness.  Respiratory:   Negative for cough.   Cardiovascular:  Negative for chest pain.  Gastrointestinal:  Positive for blood in stool and rectal pain. Negative for abdominal pain, diarrhea, nausea and vomiting.  Genitourinary:  Negative for dysuria and hematuria.  Musculoskeletal:  Negative for myalgias.  Skin:  Negative for rash.  Neurological:  Negative for headaches.   Physical Exam Updated Vital Signs Pulse 69   Temp (!) 97.5 F (36.4 C) (Oral)   Ht 6' 3.5" (1.918 m)   Wt 93.9 kg   SpO2 96%   BMI 25.53 kg/m   Physical Exam Vitals and nursing note reviewed. Exam conducted with a chaperone present.  Constitutional:      General:  He is not in acute distress.    Appearance: He is well-developed.  HENT:     Head: Normocephalic and atraumatic.  Eyes:     General:        Right eye: No discharge.        Left eye: No discharge.     Conjunctiva/sclera: Conjunctivae normal.  Cardiovascular:     Rate and Rhythm: Normal rate and regular rhythm.     Heart sounds: Normal heart sounds.  Pulmonary:     Effort: Pulmonary effort is normal.     Breath sounds: Normal breath sounds.  Abdominal:     Palpations: Abdomen is soft.     Tenderness: There is no abdominal tenderness. There is no guarding or rebound.  Genitourinary:    Rectum: Tenderness present. No external hemorrhoid.     Comments: There appeared to be some dried [place] and red blood in stool. Stool on DRE was light [place].  Musculoskeletal:     Cervical back: Normal range of motion and neck supple.  Skin:    General: Skin is warm and dry.  Neurological:     Mental Status: He is alert.    ED Results / Procedures / Treatments   Labs (all labs ordered are listed, but only abnormal results are displayed) Labs Reviewed  COMPREHENSIVE METABOLIC PANEL - Abnormal; Notable for the following components:      Result Value   Sodium 133 (*)    All other components within normal limits  CBC WITH DIFFERENTIAL/PLATELET - Abnormal; Notable for the  following components:   Hemoglobin 12.6 (*)    All other components within normal limits  PROTIME-INR - Abnormal; Notable for the following components:   Prothrombin Time 45.2 (*)    INR 4.8 (*)    All other components within normal limits  POC OCCULT BLOOD, ED - Abnormal; Notable for the following components:   Fecal Occult Bld POSITIVE (*)    All other components within normal limits  CBG MONITORING, ED  TYPE AND SCREEN    EKG EKG Interpretation  Date/Time:  Wednesday November 17 2020 20:17:52 EDT Ventricular Rate:  77 PR Interval:  231 QRS Duration: 103 QT Interval:  388 QTC Calculation: 440 R Axis:   81 Text Interpretation: Sinus arrhythmia Prolonged PR interval Borderline right axis deviation Confirmed by Godfrey Pick [phone]) on 11/17/2020 9:04:26 PM  Radiology CT Abdomen Pelvis W Contrast  Result Date: 11/15/2020 CLINICAL DATA:  Urinary retention. EXAM: CT ABDOMEN AND PELVIS WITH CONTRAST TECHNIQUE: Multidetector CT imaging of the abdomen and pelvis was performed using the standard protocol following bolus administration of intravenous contrast. CONTRAST:  48mL OMNIPAQUE IOHEXOL 350 MG/ML SOLN COMPARISON:  None. FINDINGS: Lower chest: Multiple noncalcified lung nodules and lung masses of various sizes are seen within the bilateral lung bases. The largest is seen within the anteromedial aspect of the right lower lobe and measures approximately 3.5 cm x 2.2 cm (incompletely imaged). An 8 mm thick anterior pericardial effusion is noted. Hepatobiliary: Multiple cysts are seen scattered throughout the liver parenchyma. The largest measures approximately 3.6 cm x 2.9 cm and is seen within the inferior aspect of the right lobe. No gallstones, gallbladder wall thickening, or biliary dilatation. Pancreas: Unremarkable. No pancreatic ductal dilatation or surrounding inflammatory changes. Spleen: Normal in size without focal abnormality. Adrenals/Urinary Tract: Adrenal glands are unremarkable.  Kidneys are normal in size, without renal calculi or hydronephrosis. Numerous large bilateral simple renal cysts are seen. The largest is seen within the right  kidney and measures approximately 12.2 cm x 10.0 cm. A Foley catheter is seen within the urinary bladder. Mild inflammatory fat stranding is seen surrounding the urinary bladder. Stomach/Bowel: Stomach is within normal limits. Appendix appears normal. A large amount of stool is seen within the distal sigmoid colon and rectum. No evidence of bowel wall thickening, distention, or inflammatory changes. Vascular/Lymphatic: Aortic atherosclerosis. No enlarged abdominal or pelvic lymph nodes. Reproductive: The prostate gland is moderately enlarged. Other: No abdominal wall hernia or abnormality. No abdominopelvic ascites. Musculoskeletal: Multilevel degenerative changes seen throughout the lumbar spine. IMPRESSION: 1. Multiple noncalcified lung nodules and lung masses consistent with the patient's known pulmonary metastasis 2. Numerous large bilateral simple renal cysts. 3. Multiple hepatic cysts. 4. Enlarged prostate gland. 5. Aortic atherosclerosis. Aortic Atherosclerosis (ICD10-I70.0). Electronically Signed   By: Virgina Norfolk M.D.   On: 11/15/2020 23:23    Procedures Procedures   Medications Ordered in ED Medications - No data to display  ED Course  I have reviewed the triage vital signs and the nursing notes.  Pertinent labs & imaging results that were available during my care of the patient were reviewed by me and considered in my medical decision making (see chart for details).  Patient seen and examined. Work-up initiated. DRE performed with PA student chaperone. Reviewed recent CT imaging. Currently no sign of urinary retention -- leg bag nearly full with amber urine.   Vital signs reviewed and are as follows: Pulse 69   Temp (!) 97.5 F (36.4 C) (Oral)   Ht 6' 3.5" (1.918 m)   Wt 93.9 kg   SpO2 96%   BMI 25.53 kg/m    Hemoglobin is stable but INR is 4.8.  Stools are heme positive.  Recommend admission for monitoring, INR correction.  Patient is agreeable.  He continues to have rectal spasms.  I have ordered 50 mcg of fentanyl to see if this helps.  Reviewed telemetry.  It picks up as A. fib at times, however it appears as though he is having frequent PVCs, no definite A. fib.  Updated family.  They are in agreement.  Discussed with Dr. Doren Custard.  Spoke with Dr. Jonelle Sidle who will see patient.     MDM Rules/Calculators/A&P                           Admit.   Final Clinical Impression(s) / ED Diagnoses Final diagnoses:  Rectal bleeding  Elevated INR    Rx / DC Orders ED Discharge Orders     None        Carlisle Cater, Hershal Coria 11/17/20 2229    Godfrey Pick, MD 11/19/20 1157

## 2020-11-17 NOTE — ED Triage Notes (Signed)
Pt presents to the ED via EMS from home. Per EMS, pt is c/o rectal bleeding and rectal pain which began two weeks ago with accompanying abd pain. Pt reports one episode of bright red stool this afternoon around 1600 today. Pt has had loose stool x5-7days. Pt had a past surgical procedure where "fluid and bowel was removed" done at [facility]. Pt takes Coumadin for previous PE and per EMS this medication has not been stopped. Pt was seen by Urology today and had an indwelling foley placed with 6L urine retained.

## 2020-11-17 NOTE — H&P (Signed)
History and Physical   CLEOPHAS YOAK BSJ:[id number] DOB: [birth year] DOA: 11/17/2020  Referring MD/NP/PA: Dr. Almyra Free  PCP: Alain Marion, Evie Lacks, MD   Outpatient Specialists: Dr. [name], oncology  Patient coming from: Home  Chief Complaint: Rectal bleed  HPI: Clarence Hopkins is a 87 y.o. male with medical history significant of pulmonary embolism secondary to protein C deficiency on chronic warfarin therapy, hyperlipidemia, multifocal pneumonia history of melanoma, malignant thyroid cancer with previous lung mass who presented to the ER with rectal bleed.  Patient has recurrent UTI with retention and indwelling Foley catheter.  He was on Levaquin recently.  He is also in his warfarin came to the ER with supratherapeutic INR of 4.8 also complaining of rectal bleed.  He noticed it around 4:00 this afternoon and has persistently stayed since afternoon.  Patient was in the ER about 2 days ago for abdominal pain and constipation.  At that point he was treated symptomatically got better but then had a urinary retention.  He did have fecal infection.  He was disimpacted but now came back with rectal bleed.  He did have rectal pain since then.  At this point patient presumably has rectal bleed probably from trauma recent bleeding as well as his rectal bleed.  Patient be admitted for observation..  ED Course: Temperature 97.5 blood pressure 136/90, pulse 77, respirate of 18 oxygen sat 96% on room air.  White count 7.8 hemoglobin 12.6 and platelets 198.  Sodium 133 potassium four-point chloride 100 CO2 28 BUN 17 creatinine 1.17 calcium 9.0.  Fecal occult blood testing is positive x2.  CT abdomen pelvis from 2 days ago only showed fecal impaction.  He is being admitted for rectal bleed.  Review of Systems: As per HPI otherwise 10 point review of systems negative.    Past Medical History:  Diagnosis Date   Arthritis    Cancer ([place]) 2003   melanoma  L ear   GERD (gastroesophageal reflux disease)     Hypothyroidism    post op   Lung mass    Pulmonary embolism ([place]) 2005 & 2007   protein C deficiency    Past Surgical History:  Procedure Laterality Date   BRONCHIAL BRUSHINGS  07/15/2019   Procedure: BRONCHIAL BRUSHINGS;  Surgeon: Rigoberto Noel, MD;  Location: [place];  Service: Cardiopulmonary;;   BRONCHIAL WASHINGS  07/15/2019   Procedure: BRONCHIAL WASHINGS;  Surgeon: Rigoberto Noel, MD;  Location: [facility];  Service: Cardiopulmonary;;   CARDIAC CATHETERIZATION     > 20 years ago " everything was okay"    CERVICAL FUSION      X 2; Dr Vertell Limber   COLONOSCOPY     ENDOBRONCHIAL ULTRASOUND N/A 07/15/2019   Procedure: ENDOBRONCHIAL ULTRASOUND;  Surgeon: Rigoberto Noel, MD;  Location: [place];  Service: Cardiopulmonary;  Laterality: N/A;   FINE NEEDLE ASPIRATION  07/15/2019   Procedure: FINE NEEDLE ASPIRATION (FNA) LINEAR;  Surgeon: Rigoberto Noel, MD;  Location: [place];  Service: Cardiopulmonary;;   FINGER ARTHROPLASTY Right 04/17/2013   Procedure: IMPLANT ARTHROPLASTY RIGHT INDEX;  Surgeon: Cammie Sickle., MD;  Location: [place];  Service: Orthopedics;  Laterality: Right;   LUMBAR LAMINECTOMY  2010   Dr Becky Sax   MELANOMA EXCISION  2003   L ear   THYROIDECTOMY  1971   cytology indefinite   TONSILLECTOMY     TOTAL KNEE ARTHROPLASTY  2004   left   VIDEO BRONCHOSCOPY N/A 07/15/2019   Procedure:  VIDEO BRONCHOSCOPY WITHOUT FLUORO;  Surgeon: Rigoberto Noel, MD;  Location: [facility] [place];  Service: Cardiopulmonary;  Laterality: N/A;   WISDOM TOOTH EXTRACTION       reports that he quit smoking about 27 years ago. His smoking use included cigarettes. He has a 30.75 pack-year smoking history. He has never used smokeless tobacco. He reports current alcohol use. He reports that he does not use drugs.  No Known Allergies  Family History  Problem Relation Age of Onset   Breast cancer Mother    Alzheimer's disease Father    Breast cancer Maternal Grandmother     Breast cancer Sister    Cancer Sister    Diabetes Neg Hx    Stroke Neg Hx    Heart disease Neg Hx      Prior to Admission medications   Medication Sig Start Date End Date Taking? Authorizing Provider  Biotin w/ Vitamins C & E (HAIR/SKIN/NAILS PO) Chew 2 tablets by mouth daily.    [provider]  bisacodyl (DULCOLAX) 10 MG suppository Place 1 suppository (10 mg total) rectally as needed for moderate constipation. 11/16/20   Maudie Flakes, MD  clindamycin (CLEOCIN) 300 MG capsule Take 300 mg by mouth 3 (three) times daily. 10/19/20   [provider]  Glycerin-Hypromellose-PEG 400 (DRY EYE RELIEF DROPS) 0.2-0.2-1 % SOLN Place 1 drop into both eyes daily as needed (Dry eye).    [provider]  Ibuprofen-diphenhydrAMINE Cit (ADVIL PM PO) Take by mouth at bedtime as needed.    [provider]  imiquimod (ALDARA) 5 % cream SMARTSIG:1 Packet(s) Topical Every Night Patient not taking: No sig reported 11/06/19   [provider]  levothyroxine (SYNTHROID) 175 MCG tablet Take 175 mcg by mouth daily before breakfast.    [provider]  mineral oil liquid Take 15 mLs by mouth daily as needed for moderate constipation.    [provider]  tamsulosin (FLOMAX) 0.4 MG CAPS capsule TAKE 1 CAPSULE BY MOUTH DAILY AFTER SUPPER 08/05/20   Plotnikov, Evie Lacks, MD  warfarin (COUMADIN) 5 MG tablet TAKE 1 TABLET DAILY OR AS DIRECTED BY ANTICOAGULATION CLINIC Patient taking differently: 10/21/2020 Takes 7.25 mg on M_W_F. and 5 mg all other days.. 09/02/20   Plotnikov, Evie Lacks, MD    Physical Exam: Vitals:   11/17/20 1909 11/17/20 1910 11/17/20 2022 11/17/20 2200  BP:   (!) 153/80 136/90  Pulse: 69  77 60  Resp:   18 16  Temp: (!) 97.5 F (36.4 C)     TempSrc: Oral     SpO2: 96%  97% 97%  Weight:  93.9 kg    Height:  6' 3.5" (1.918 m)        Constitutional: NAD, calm, comfortable Vitals:   11/17/20 1909 11/17/20 1910 11/17/20 2022  11/17/20 2200  BP:   (!) 153/80 136/90  Pulse: 69  77 60  Resp:   18 16  Temp: (!) 97.5 F (36.4 C)     TempSrc: Oral     SpO2: 96%  97% 97%  Weight:  93.9 kg    Height:  6' 3.5" (1.918 m)     Eyes: PERRL, lids and conjunctivae normal ENMT: Mucous membranes are moist. Posterior pharynx clear of any exudate or lesions.Normal dentition.  Neck: normal, supple, no masses, no thyromegaly Respiratory: clear to auscultation bilaterally, no wheezing, no crackles. Normal respiratory effort. No accessory muscle use.  Cardiovascular: Regular rate and rhythm, no murmurs / rubs / gallops. No  extremity edema. 2+ pedal pulses. No carotid bruits.  Abdomen: no tenderness, no masses palpated. No hepatosplenomegaly. Bowel sounds positive.  Musculoskeletal: no clubbing / cyanosis. No joint deformity upper and lower extremities. Good ROM, no contractures. Normal muscle tone.  Skin: no rashes, lesions, ulcers. No induration Neurologic: CN 2-12 grossly intact. Sensation intact, DTR normal. Strength 5/5 in all 4.  Psychiatric: Normal judgment and insight. Alert and oriented x 3. Normal mood.     Labs on Admission: I have personally reviewed following labs and imaging studies  CBC: Recent Labs  Lab 11/15/20 2050 11/17/20 2115  WBC 7.8 7.8  NEUTROABS 6.5 5.7  HGB 12.4* 12.6*  HCT 39.7 40.1  MCV 89.4 89.9  PLT 199 539   Basic Metabolic Panel: Recent Labs  Lab 11/15/20 2050 11/17/20 2115  NA 137 133*  K 4.2 4.1  CL 100 100  CO2 28 28  GLUCOSE 111* 96  BUN 17 17  CREATININE 1.14 1.16  CALCIUM 9.2 9.0   GFR: Estimated Creatinine Clearance: 53.4 mL/min (by C-G formula based on SCr of 1.16 mg/dL). Liver Function Tests: Recent Labs  Lab 11/15/20 2050 11/17/20 2115  AST 22 29  ALT 16 17  ALKPHOS 97 109  BILITOT 0.8 1.2  PROT 7.4 7.3  ALBUMIN 4.1 3.8   Recent Labs  Lab 11/15/20 2050  LIPASE 14   No results for input(s): AMMONIA in the last 168 hours. Coagulation Profile: Recent  Labs  Lab 11/17/20 2115  INR 4.8*   Cardiac Enzymes: No results for input(s): CKTOTAL, CKMB, CKMBINDEX, TROPONINI in the last 168 hours. BNP (last 3 results) No results for input(s): PROBNP in the last 8760 hours. HbA1C: No results for input(s): HGBA1C in the last 72 hours. CBG: Recent Labs  Lab 11/17/20 1909  GLUCAP 83   Lipid Profile: No results for input(s): CHOL, HDL, LDLCALC, TRIG, CHOLHDL, LDLDIRECT in the last 72 hours. Thyroid Function Tests: No results for input(s): TSH, T4TOTAL, FREET4, T3FREE, THYROIDAB in the last 72 hours. Anemia Panel: No results for input(s): VITAMINB12, FOLATE, FERRITIN, TIBC, IRON, RETICCTPCT in the last 72 hours. Urine analysis:    Component Value Date/Time   COLORURINE YELLOW 11/15/2020 2035   APPEARANCEUR CLEAR 11/15/2020 2035   LABSPEC 1.011 11/15/2020 2035   PHURINE 6.5 11/15/2020 2035   GLUCOSEU NEGATIVE 11/15/2020 2035   GLUCOSEU NEGATIVE 03/14/2019 0753   HGBUR NEGATIVE 11/15/2020 2035   BILIRUBINUR NEGATIVE 11/15/2020 2035   BILIRUBINUR n 05/24/2015 1620   KETONESUR NEGATIVE 11/15/2020 2035   PROTEINUR NEGATIVE 11/15/2020 2035   UROBILINOGEN 0.2 03/14/2019 0753   NITRITE NEGATIVE 11/15/2020 2035   LEUKOCYTESUR TRACE (A) 11/15/2020 2035   Sepsis Labs: @LABRCNTIP (procalcitonin:4,lacticidven:4) )No results found for this or any previous visit (from the past 240 hour(s)).   Radiological Exams on Admission: CT Abdomen Pelvis W Contrast  Result Date: 11/15/2020 CLINICAL DATA:  Urinary retention. EXAM: CT ABDOMEN AND PELVIS WITH CONTRAST TECHNIQUE: Multidetector CT imaging of the abdomen and pelvis was performed using the standard protocol following bolus administration of intravenous contrast. CONTRAST:  49mL OMNIPAQUE IOHEXOL 350 MG/ML SOLN COMPARISON:  None. FINDINGS: Lower chest: Multiple noncalcified lung nodules and lung masses of various sizes are seen within the bilateral lung bases. The largest is seen within the  anteromedial aspect of the right lower lobe and measures approximately 3.5 cm x 2.2 cm (incompletely imaged). An 8 mm thick anterior pericardial effusion is noted. Hepatobiliary: Multiple cysts are seen scattered throughout the liver parenchyma. The largest measures approximately  3.6 cm x 2.9 cm and is seen within the inferior aspect of the right lobe. No gallstones, gallbladder wall thickening, or biliary dilatation. Pancreas: Unremarkable. No pancreatic ductal dilatation or surrounding inflammatory changes. Spleen: Normal in size without focal abnormality. Adrenals/Urinary Tract: Adrenal glands are unremarkable. Kidneys are normal in size, without renal calculi or hydronephrosis. Numerous large bilateral simple renal cysts are seen. The largest is seen within the right kidney and measures approximately 12.2 cm x 10.0 cm. A Foley catheter is seen within the urinary bladder. Mild inflammatory fat stranding is seen surrounding the urinary bladder. Stomach/Bowel: Stomach is within normal limits. Appendix appears normal. A large amount of stool is seen within the distal sigmoid colon and rectum. No evidence of bowel wall thickening, distention, or inflammatory changes. Vascular/Lymphatic: Aortic atherosclerosis. No enlarged abdominal or pelvic lymph nodes. Reproductive: The prostate gland is moderately enlarged. Other: No abdominal wall hernia or abnormality. No abdominopelvic ascites. Musculoskeletal: Multilevel degenerative changes seen throughout the lumbar spine. IMPRESSION: 1. Multiple noncalcified lung nodules and lung masses consistent with the patient's known pulmonary metastasis 2. Numerous large bilateral simple renal cysts. 3. Multiple hepatic cysts. 4. Enlarged prostate gland. 5. Aortic atherosclerosis. Aortic Atherosclerosis (ICD10-I70.0). Electronically Signed   By: Virgina Norfolk M.D.   On: 11/15/2020 23:23    EKG: Independently reviewed.  Sinus rhythm no other significant  findings  Assessment/Plan Principal Problem:   Rectal bleed Active Problems:   Protein C deficiency (HCC)   PULMONARY EMBOLISM, HX OF   Long term (current) use of anticoagulants   Hypertriglyceridemia     #1 rectal bleed: Secondary to Coumadin coagulopathy.  Patient received vitamin K in the ER.  Goal is to reverse his warfarin.  He however needs to be fully anticoagulated with history of protein C deficiency.  We will continue to monitor H&H.  IV Protonix as well as GI consult if needed.  H&H appears to be stable at his baseline.  #2 history of pulmonary embolism: Continue with anticoagulation once patient is stable.  #3 hypertriglyceridemia: Continue monitoring.  Not on any treatment at home.  #4 hypothyroidism: Resume levothyroxine once confirmed.  #5 BPH: Continue with tamsulosin.   DVT prophylaxis: SCD hold warfarin Code Status: Full code Family Communication: No family at bedside Disposition Plan: Home Consults called: None Admission status: Observation  Severity of Illness: The appropriate patient status for this patient is OBSERVATION. Observation status is judged to be reasonable and necessary in order to provide the required intensity of service to ensure the patient's safety. The patient's presenting symptoms, physical exam findings, and initial radiographic and laboratory data in the context of their medical condition is felt to place them at decreased risk for further clinical deterioration. Furthermore, it is anticipated that the patient will be medically stable for discharge from the hospital within 2 midnights of admission. The following factors support the patient status of observation.   " The patient's presenting symptoms include rectal bleed. " The physical exam findings include no significant findings on exam. " The initial radiographic and laboratory data are H&H stable.   Barbette Merino MD [facility] Pager [phone]  If 7PM-7AM, please  contact night-coverage [email or web address] Password [facility]  11/17/2020, 10:36 PM

## 2020-11-18 ENCOUNTER — Telehealth: Payer: Self-pay | Admitting: Internal Medicine

## 2020-11-18 ENCOUNTER — Other Ambulatory Visit: Payer: Self-pay

## 2020-11-18 DIAGNOSIS — L8991 Pressure ulcer of unspecified site, stage 1: Secondary | ICD-10-CM | POA: Diagnosis present

## 2020-11-18 DIAGNOSIS — N320 Bladder-neck obstruction: Secondary | ICD-10-CM | POA: Diagnosis present

## 2020-11-18 DIAGNOSIS — K625 Hemorrhage of anus and rectum: Secondary | ICD-10-CM | POA: Diagnosis not present

## 2020-11-18 DIAGNOSIS — K6289 Other specified diseases of anus and rectum: Secondary | ICD-10-CM | POA: Diagnosis not present

## 2020-11-18 DIAGNOSIS — N281 Cyst of kidney, acquired: Secondary | ICD-10-CM | POA: Diagnosis not present

## 2020-11-18 DIAGNOSIS — Z20822 Contact with and (suspected) exposure to covid-19: Secondary | ICD-10-CM | POA: Diagnosis present

## 2020-11-18 DIAGNOSIS — D6832 Hemorrhagic disorder due to extrinsic circulating anticoagulants: Secondary | ICD-10-CM | POA: Diagnosis not present

## 2020-11-18 DIAGNOSIS — K5900 Constipation, unspecified: Secondary | ICD-10-CM | POA: Diagnosis not present

## 2020-11-18 DIAGNOSIS — Y846 Urinary catheterization as the cause of abnormal reaction of the patient, or of later complication, without mention of misadventure at the time of the procedure: Secondary | ICD-10-CM | POA: Diagnosis present

## 2020-11-18 DIAGNOSIS — R935 Abnormal findings on diagnostic imaging of other abdominal regions, including retroperitoneum: Secondary | ICD-10-CM | POA: Diagnosis not present

## 2020-11-18 DIAGNOSIS — Z86711 Personal history of pulmonary embolism: Secondary | ICD-10-CM | POA: Diagnosis not present

## 2020-11-18 DIAGNOSIS — E785 Hyperlipidemia, unspecified: Secondary | ICD-10-CM | POA: Diagnosis present

## 2020-11-18 DIAGNOSIS — R338 Other retention of urine: Secondary | ICD-10-CM | POA: Diagnosis present

## 2020-11-18 DIAGNOSIS — D500 Iron deficiency anemia secondary to blood loss (chronic): Secondary | ICD-10-CM | POA: Diagnosis present

## 2020-11-18 DIAGNOSIS — E781 Pure hyperglyceridemia: Secondary | ICD-10-CM | POA: Diagnosis present

## 2020-11-18 DIAGNOSIS — Z85820 Personal history of malignant melanoma of skin: Secondary | ICD-10-CM | POA: Diagnosis not present

## 2020-11-18 DIAGNOSIS — K594 Anal spasm: Secondary | ICD-10-CM | POA: Diagnosis present

## 2020-11-18 DIAGNOSIS — N401 Enlarged prostate with lower urinary tract symptoms: Secondary | ICD-10-CM | POA: Diagnosis present

## 2020-11-18 DIAGNOSIS — Z7901 Long term (current) use of anticoagulants: Secondary | ICD-10-CM | POA: Diagnosis not present

## 2020-11-18 DIAGNOSIS — R791 Abnormal coagulation profile: Secondary | ICD-10-CM | POA: Diagnosis not present

## 2020-11-18 DIAGNOSIS — K7689 Other specified diseases of liver: Secondary | ICD-10-CM | POA: Diagnosis present

## 2020-11-18 DIAGNOSIS — I480 Paroxysmal atrial fibrillation: Secondary | ICD-10-CM | POA: Diagnosis present

## 2020-11-18 DIAGNOSIS — C7801 Secondary malignant neoplasm of right lung: Secondary | ICD-10-CM | POA: Diagnosis present

## 2020-11-18 DIAGNOSIS — K6389 Other specified diseases of intestine: Secondary | ICD-10-CM | POA: Diagnosis not present

## 2020-11-18 DIAGNOSIS — Z87440 Personal history of urinary (tract) infections: Secondary | ICD-10-CM | POA: Diagnosis not present

## 2020-11-18 DIAGNOSIS — E890 Postprocedural hypothyroidism: Secondary | ICD-10-CM | POA: Diagnosis present

## 2020-11-18 DIAGNOSIS — T8389XA Other specified complication of genitourinary prosthetic devices, implants and grafts, initial encounter: Secondary | ICD-10-CM | POA: Diagnosis present

## 2020-11-18 DIAGNOSIS — Z960 Presence of urogenital implants: Secondary | ICD-10-CM | POA: Diagnosis present

## 2020-11-18 DIAGNOSIS — K921 Melena: Secondary | ICD-10-CM | POA: Diagnosis present

## 2020-11-18 DIAGNOSIS — K5641 Fecal impaction: Secondary | ICD-10-CM | POA: Diagnosis not present

## 2020-11-18 DIAGNOSIS — D6859 Other primary thrombophilia: Secondary | ICD-10-CM | POA: Diagnosis present

## 2020-11-18 DIAGNOSIS — T45515A Adverse effect of anticoagulants, initial encounter: Secondary | ICD-10-CM | POA: Diagnosis present

## 2020-11-18 DIAGNOSIS — C7802 Secondary malignant neoplasm of left lung: Secondary | ICD-10-CM | POA: Diagnosis present

## 2020-11-18 DIAGNOSIS — R195 Other fecal abnormalities: Secondary | ICD-10-CM | POA: Diagnosis not present

## 2020-11-18 DIAGNOSIS — L8990 Pressure ulcer of unspecified site, unspecified stage: Secondary | ICD-10-CM | POA: Insufficient documentation

## 2020-11-18 DIAGNOSIS — N400 Enlarged prostate without lower urinary tract symptoms: Secondary | ICD-10-CM | POA: Diagnosis not present

## 2020-11-18 LAB — RESP PANEL BY RT-PCR (FLU A&B, COVID) ARPGX2
Influenza A by PCR: NEGATIVE
Influenza B by PCR: NEGATIVE
SARS Coronavirus 2 by RT PCR: NEGATIVE

## 2020-11-18 LAB — COMPREHENSIVE METABOLIC PANEL
ALT: 15 U/L (ref 0–44)
AST: 23 U/L (ref 15–41)
Albumin: 3.1 g/dL — ABNORMAL LOW (ref 3.5–5.0)
Alkaline Phosphatase: 90 U/L (ref 38–126)
Anion gap: 7 (ref 5–15)
BUN: 13 mg/dL (ref 8–23)
CO2: 25 mmol/L (ref 22–32)
Calcium: 8.4 mg/dL — ABNORMAL LOW (ref 8.9–10.3)
Chloride: 107 mmol/L (ref 98–111)
Creatinine, Ser: 1.05 mg/dL (ref 0.61–1.24)
GFR, Estimated: >60 mL/min (ref >60)
Glucose, Bld: 85 mg/dL (ref 70–99)
Potassium: 3.8 mmol/L (ref 3.5–5.1)
Sodium: 139 mmol/L (ref 135–145)
Total Bilirubin: 0.9 mg/dL (ref 0.3–1.2)
Total Protein: 6.2 g/dL — ABNORMAL LOW (ref 6.5–8.1)

## 2020-11-18 LAB — CBC
HCT: 35 % — ABNORMAL LOW (ref 39.0–52.0)
HCT: 37.4 % — ABNORMAL LOW (ref 39.0–52.0)
HCT: 37.5 % — ABNORMAL LOW (ref 39.0–52.0)
HCT: 39.1 % (ref 39.0–52.0)
Hemoglobin: 11.2 g/dL — ABNORMAL LOW (ref 13.0–17.0)
Hemoglobin: 11.6 g/dL — ABNORMAL LOW (ref 13.0–17.0)
Hemoglobin: 11.7 g/dL — ABNORMAL LOW (ref 13.0–17.0)
Hemoglobin: 12.1 g/dL — ABNORMAL LOW (ref 13.0–17.0)
MCH: 28.8 pg (ref 26.0–34.0)
MCH: 28.6 pg (ref 26.0–34.0)
MCH: 28.1 pg (ref 26.0–34.0)
MCH: 28.1 pg (ref 26.0–34.0)
MCHC: 32 g/dL (ref 30.0–36.0)
MCHC: 31 g/dL (ref 30.0–36.0)
MCHC: 31.2 g/dL (ref 30.0–36.0)
MCHC: 30.9 g/dL (ref 30.0–36.0)
MCV: 90 fL (ref 80.0–100.0)
MCV: 92.1 fL (ref 80.0–100.0)
MCV: 90.1 fL (ref 80.0–100.0)
MCV: 90.9 fL (ref 80.0–100.0)
Platelets: 181 10*3/uL (ref 150–400)
Platelets: 174 10*3/uL (ref 150–400)
Platelets: 196 10*3/uL (ref 150–400)
Platelets: 193 10*3/uL (ref 150–400)
RBC: 3.89 MIL/uL — ABNORMAL LOW (ref 4.22–5.81)
RBC: 4.06 MIL/uL — ABNORMAL LOW (ref 4.22–5.81)
RBC: 4.16 MIL/uL — ABNORMAL LOW (ref 4.22–5.81)
RBC: 4.3 MIL/uL (ref 4.22–5.81)
RDW: 13.7 % (ref 11.5–15.5)
RDW: 13.8 % (ref 11.5–15.5)
RDW: 13.8 % (ref 11.5–15.5)
RDW: 13.9 % (ref 11.5–15.5)
WBC: 6.4 10*3/uL (ref 4.0–10.5)
WBC: 6 10*3/uL (ref 4.0–10.5)
WBC: 5.6 10*3/uL (ref 4.0–10.5)
WBC: 7.2 10*3/uL (ref 4.0–10.5)
nRBC: 0 % (ref 0.0–0.2)
nRBC: 0 % (ref 0.0–0.2)
nRBC: 0 % (ref 0.0–0.2)
nRBC: 0 % (ref 0.0–0.2)

## 2020-11-18 LAB — ABO/RH: ABO/RH(D): A POS

## 2020-11-18 LAB — PROTIME-INR
INR: 4.7 (ref 0.8–1.2)
Prothrombin Time: 44.1 seconds — ABNORMAL HIGH (ref 11.4–15.2)

## 2020-11-18 MED ORDER — LACTATED RINGERS IV SOLN: INTRAVENOUS | Status: DC

## 2020-11-18 MED ORDER — PANTOPRAZOLE SODIUM 40 MG IV SOLR
40.0000 mg | Freq: Two times a day (BID) | INTRAVENOUS | Status: DC
  Administered 2020-11-18: 40 mg via INTRAVENOUS
  Filled 2020-11-18: qty 40

## 2020-11-18 MED ORDER — ONDANSETRON HCL 4 MG/2ML IJ SOLN: 4.0000 mg | Freq: Four times a day (QID) | INTRAMUSCULAR | Status: DC | PRN

## 2020-11-18 MED ORDER — OXYCODONE HCL 5 MG PO TABS
5.0000 mg | ORAL_TABLET | Freq: Four times a day (QID) | ORAL | Status: DC | PRN
  Administered 2020-11-18 – 2020-11-20 (×6): 5 mg via ORAL
  Filled 2020-11-18 (×6): qty 1

## 2020-11-18 MED ORDER — MORPHINE SULFATE (PF) 2 MG/ML IV SOLN
2.0000 mg | INTRAVENOUS | Status: DC | PRN
Start: 2020-11-18 — End: 2020-11-20
  Administered 2020-11-18 – 2020-11-20 (×17): 2 mg via INTRAVENOUS
  Filled 2020-11-18 (×17): qty 1

## 2020-11-18 MED ORDER — ACETAMINOPHEN 325 MG PO TABS
650.0000 mg | ORAL_TABLET | Freq: Four times a day (QID) | ORAL | Status: DC | PRN
  Administered 2020-11-18 – 2020-11-21 (×4): 650 mg via ORAL
  Filled 2020-11-18 (×4): qty 2

## 2020-11-18 MED ORDER — POLYVINYL ALCOHOL 1.4 % OP SOLN: 1.0000 [drp] | OPHTHALMIC | Status: DC | PRN

## 2020-11-18 MED ORDER — PHENAZOPYRIDINE HCL 200 MG PO TABS
200.0000 mg | ORAL_TABLET | Freq: Three times a day (TID) | ORAL | Status: AC
  Administered 2020-11-18 – 2020-11-20 (×6): 200 mg via ORAL
  Filled 2020-11-18 (×7): qty 1

## 2020-11-18 MED ORDER — ONDANSETRON HCL 4 MG PO TABS: 4.0000 mg | ORAL_TABLET | Freq: Four times a day (QID) | ORAL | Status: DC | PRN

## 2020-11-18 MED ORDER — CHLORHEXIDINE GLUCONATE CLOTH 2 % EX PADS
6.0000 | MEDICATED_PAD | Freq: Every day | CUTANEOUS | Status: DC
  Administered 2020-11-18 – 2020-11-23 (×4): 6 via TOPICAL

## 2020-11-18 MED ORDER — FINASTERIDE 5 MG PO TABS
5.0000 mg | ORAL_TABLET | Freq: Every day | ORAL | Status: DC
  Administered 2020-11-18 – 2020-11-23 (×6): 5 mg via ORAL
  Filled 2020-11-18 (×7): qty 1

## 2020-11-18 MED ORDER — LEVOTHYROXINE SODIUM 50 MCG PO TABS
175.0000 ug | ORAL_TABLET | Freq: Every day | ORAL | Status: DC
  Administered 2020-11-19 – 2020-11-24 (×6): 175 ug via ORAL
  Filled 2020-11-18 (×6): qty 1

## 2020-11-18 MED ORDER — TAMSULOSIN HCL 0.4 MG PO CAPS
0.4000 mg | ORAL_CAPSULE | Freq: Every day | ORAL | Status: DC
  Administered 2020-11-18 – 2020-11-23 (×6): 0.4 mg via ORAL
  Filled 2020-11-18 (×7): qty 1

## 2020-11-18 MED ORDER — VIBEGRON 75 MG PO TABS
1.0000 | ORAL_TABLET | Freq: Every day | ORAL | Status: DC
  Administered 2020-11-18 – 2020-11-19 (×2): 1 via ORAL

## 2020-11-18 MED ORDER — METOPROLOL TARTRATE 5 MG/5ML IV SOLN: 5.0000 mg | Freq: Four times a day (QID) | INTRAVENOUS | Status: DC | PRN

## 2020-11-18 NOTE — Telephone Encounter (Signed)
[facility] FYI... 11/16/2020  Caller states pt has a catheter for several weeks. Trying to get their friend admitted to the hospital and is needing help. He has been sent home from hospital. Sx: severe bladder sx, dehydration, celutlitis and severe abd pain/spasms. He has an ulcer on his leg. Thinks he needs to be in hospital and on IV fluids and antibx. no fever.  Advised to go to ED now. Patient declined at first but decided to go to [facility] ED after talking to on-call provider.

## 2020-11-18 NOTE — Progress Notes (Signed)
PROGRESS NOTE    Clarence Hopkins   [facility]:[id number]  DOB: [birth year]  PCP: Cassandria Anger, MD    DOA: 11/17/2020 LOS: 0    Brief Narrative / Hospital Course to Date:   No notes on file   Assessment & Plan   Principal Problem:   Rectal bleed Active Problems:   Protein C deficiency (HCC)   PULMONARY EMBOLISM, HX OF   Long term (current) use of anticoagulants   Hypertriglyceridemia   Rectal bleeding   Pressure injury of skin   Rectal bleeding -with supratherapeutic INR and recent fecal disimpaction, suspect due to minor trauma. Given oral vitamin K in the ED on admission. 10/6: Patient reports no bleeding since admission.  Hemoglobin stable. --Hold warfarin --Monitor hemoglobin and transfuse if less than 7  Supratherapeutic INR -presented with INR 4.8 in setting of rectal bleeding.   --Hold warfarin --Daily INR --Resume once INR therapeutic per pharmacy dosing --Monitor closely for bleeding  Urinary retention and bladder spasms Indwelling Foley -patient follows with urologist, Dr. Sandrea Matte. --Continue pain control per orders --Resume home Gemtesa (patient's own med) --Trial of Pyridium --Consider urology consult if bladder spasm pain not improving --Continue indwelling Foley --Outpatient urology follow-up  History of PE History of protein C deficiency Chronically anticoagulated with warfarin. -- Warfarin on hold due to bleeding and supratherapeutic INR  Hypertriglyceridemia -not on home medication  Hypothyroidism -continue levothyroxine  BPH -continue tamsulosin and finasteride  Patient BMI: Body mass index is 25.53 kg/m.   DVT prophylaxis: SCDs Start: 11/18/20 0113   Diet:  Diet Orders (From admission, onward)     Start     Ordered   11/18/20 0113  Diet clear liquid Room service appropriate? Yes; Fluid consistency: Thin  Diet effective now       Question Answer Comment  Room service appropriate? Yes   Fluid consistency: Thin      11/18/20 0112               Code Status: Full Code   Subjective 11/18/20    Patient seen in the ED holding for a bed today, wife at bedside.  He reports ongoing significant pain from bladder spasms.  Bedside RN today reports needing to give IV morphine every 2 hours.  He had just been started on the medication recently by urology [facility] Bores.  This is nonformulary medication but they have the bottle follow-up from home and would like it to be started here.  He denies any further bleeding since admission late last night.   Disposition Plan & Communication   Status is: Inpatient  Remains inpatient appropriate because: INR remains supratherapeutic, patient requires further monitoring due to rectal bleeding  Dispo: The patient is from: Home              Anticipated d/c is to: Home              Patient currently is not medically stable to d/c.   Difficult to place patient No    Family Communication: Wife at bedside on rounds today 10/6.   Consults, Procedures, Significant Events   Consultants:  None  Procedures:  None  Antimicrobials:  Anti-infectives (From admission, onward)    None         Micro    Objective   Vitals:   11/18/20 1100 11/18/20 1200 11/18/20 1430 11/18/20 1645  BP: 129/86 116/74 (!) 115/97 119/77  Pulse: 60 (!) 56 70 (!) 55  Resp: 16 (!) 25 12 20  Temp:    97.6 F (36.4 C)  TempSrc:    Oral  SpO2: 92% 95% 95% 96%  Weight:      Height:        Intake/Output Summary (Last 24 hours) at 11/18/2020 1937 Last data filed at 11/18/2020 1600 Gross per 24 hour  Intake 1123.44 ml  Output 900 ml  Net 223.44 ml   Filed Weights   11/17/20 1910  Weight: 93.9 kg    Physical Exam:  General exam: awake, alert, no acute distress HEENT: atraumatic, clear conjunctiva, anicteric sclera, moist mucus membranes, hearing grossly normal  Respiratory system: CTAB diminished bases, no wheezes, rales or rhonchi, normal respiratory effort. Cardiovascular system: normal S1/S2,  RRR Gastrointestinal system: soft, NT, ND, no HSM felt, +bowel sounds. Central nervous system: A&O x3. no gross focal neurologic deficits, normal speech Extremities: moves all , no edema, normal tone Genitourinary: Indwelling Foley in place Psychiatry: normal mood, congruent affect, judgement and insight appear normal  Labs   Data Reviewed: I have personally reviewed following labs and imaging studies  CBC: Recent Labs  Lab 11/15/20 2050 11/17/20 2115 11/18/20 0145 11/18/20 0808 11/18/20 1653 11/18/20 1832  WBC 7.8 7.8 6.4 6.0 5.6 7.2  NEUTROABS 6.5 5.7  --   --   --   --   HGB 12.4* 12.6* 11.2* 11.6* 11.7* 12.1*  HCT 39.7 40.1 35.0* 37.4* 37.5* 39.1  MCV 89.4 89.9 90.0 92.1 90.1 90.9  PLT 199 198 181 174 196 818   Basic Metabolic Panel: Recent Labs  Lab 11/15/20 2050 11/17/20 2115 11/18/20 0149  NA 137 133* 139  K 4.2 4.1 3.8  CL 100 100 107  CO2 28 28 25   GLUCOSE 111* 96 85  BUN 17 17 13   CREATININE 1.14 1.16 1.05  CALCIUM 9.2 9.0 8.4*   GFR: Estimated Creatinine Clearance: 58.9 mL/min (by C-G formula based on SCr of 1.05 mg/dL). Liver Function Tests: Recent Labs  Lab 11/15/20 2050 11/17/20 2115 11/18/20 0149  AST 22 29 23   ALT 16 17 15   ALKPHOS 97 109 90  BILITOT 0.8 1.2 0.9  PROT 7.4 7.3 6.2*  ALBUMIN 4.1 3.8 3.1*   Recent Labs  Lab 11/15/20 2050  LIPASE 14   No results for input(s): AMMONIA in the last 168 hours. Coagulation Profile: Recent Labs  Lab 11/17/20 2115 11/18/20 0900  INR 4.8* 4.7*   Cardiac Enzymes: No results for input(s): CKTOTAL, CKMB, CKMBINDEX, TROPONINI in the last 168 hours. BNP (last 3 results) No results for input(s): PROBNP in the last 8760 hours. HbA1C: No results for input(s): HGBA1C in the last 72 hours. CBG: Recent Labs  Lab 11/17/20 1909  GLUCAP 83   Lipid Profile: No results for input(s): CHOL, HDL, LDLCALC, TRIG, CHOLHDL, LDLDIRECT in the last 72 hours. Thyroid Function Tests: No results for  input(s): TSH, T4TOTAL, FREET4, T3FREE, THYROIDAB in the last 72 hours. Anemia Panel: No results for input(s): VITAMINB12, FOLATE, FERRITIN, TIBC, IRON, RETICCTPCT in the last 72 hours. Sepsis Labs: No results for input(s): PROCALCITON, LATICACIDVEN in the last 168 hours.  Recent Results (from the past 240 hour(s))  Resp Panel by RT-PCR (Flu A&B, Covid) Nasopharyngeal Swab     Status: None   Collection Time: 11/18/20 12:02 AM   Specimen: Nasopharyngeal Swab; Nasopharyngeal(NP) swabs in vial transport medium  Result Value Ref Range Status   SARS Coronavirus 2 by RT PCR NEGATIVE NEGATIVE Final    Comment: (NOTE) SARS-CoV-2 target nucleic acids are NOT DETECTED.  The  SARS-CoV-2 RNA is generally detectable in upper respiratory specimens during the acute phase of infection. The lowest concentration of SARS-CoV-2 viral copies this assay can detect is 138 copies/mL. A negative result does not preclude SARS-Cov-2 infection and should not be used as the sole basis for treatment or other patient management decisions. A negative result may occur with  improper specimen collection/handling, submission of specimen other than nasopharyngeal swab, presence of viral mutation(s) within the areas targeted by this assay, and inadequate number of viral copies(<138 copies/mL). A negative result must be combined with clinical observations, patient history, and epidemiological information. The expected result is Negative.  Fact Sheet for Patients:  EntrepreneurPulse.com.au  Fact Sheet for Healthcare Providers:  IncredibleEmployment.be  This test is no t yet approved or cleared by the [place] FDA and  has been authorized for detection and/or diagnosis of SARS-CoV-2 by FDA under an Emergency Use Authorization (EUA). This EUA will remain  in effect (meaning this test can be used) for the duration of the COVID-19 declaration under Section 564(b)(1) of the Act,  21 U.S.C.section 360bbb-3(b)(1), unless the authorization is terminated  or revoked sooner.       Influenza A by PCR NEGATIVE NEGATIVE Final   Influenza B by PCR NEGATIVE NEGATIVE Final    Comment: (NOTE) The Xpert Xpress SARS-CoV-2/FLU/RSV plus assay is intended as an aid in the diagnosis of influenza from Nasopharyngeal swab specimens and should not be used as a sole basis for treatment. Nasal washings and aspirates are unacceptable for Xpert Xpress SARS-CoV-2/FLU/RSV testing.  Fact Sheet for Patients: EntrepreneurPulse.com.au  Fact Sheet for Healthcare Providers: IncredibleEmployment.be  This test is not yet approved or cleared by the [place] FDA and has been authorized for detection and/or diagnosis of SARS-CoV-2 by FDA under an Emergency Use Authorization (EUA). This EUA will remain in effect (meaning this test can be used) for the duration of the COVID-19 declaration under Section 564(b)(1) of the Act, 21 U.S.C. section 360bbb-3(b)(1), unless the authorization is terminated or revoked.  Performed at [facility], [place] [address], [place], [place] [address]       Imaging Studies   No results found.   Medications   Scheduled Meds:  Chlorhexidine Gluconate Cloth  6 each Topical Daily   phenazopyridine  200 mg Oral TID WC   Vibegron  1 each Oral Daily   Continuous Infusions:     LOS: 0 days    Time spent: 30 minutes    Ezekiel Slocumb, DO [facility]  11/18/2020, 7:37 PM      If 7PM-7AM, please contact night-coverage. How to contact the [facility] Attending or Consulting provider [place] or covering provider during after hours [place], for this patient?    Check the care team in [facility] and look for a) attending/consulting [facility] provider listed and b) the [facility] team listed Log into [email or web address] and use [place] universal password to access. If you do not have the password, please contact  the hospital operator. Locate the [facility] provider you are looking for under [facility] and page to a number that you can be directly reached. If you still have difficulty reaching the provider, please page the [facility] (Director on Call) for the Hospitalists listed on amion for assistance.

## 2020-11-19 ENCOUNTER — Inpatient Hospital Stay (HOSPITAL_COMMUNITY): Payer: Medicare Other

## 2020-11-19 DIAGNOSIS — K5900 Constipation, unspecified: Secondary | ICD-10-CM | POA: Diagnosis not present

## 2020-11-19 DIAGNOSIS — Z7901 Long term (current) use of anticoagulants: Secondary | ICD-10-CM

## 2020-11-19 DIAGNOSIS — K625 Hemorrhage of anus and rectum: Secondary | ICD-10-CM | POA: Diagnosis not present

## 2020-11-19 LAB — BASIC METABOLIC PANEL
Anion gap: 5 (ref 5–15)
BUN: 17 mg/dL (ref 8–23)
CO2: 28 mmol/L (ref 22–32)
Calcium: 8.8 mg/dL — ABNORMAL LOW (ref 8.9–10.3)
Chloride: 103 mmol/L (ref 98–111)
Creatinine, Ser: 1.11 mg/dL (ref 0.61–1.24)
GFR, Estimated: >60 mL/min (ref >60)
Glucose, Bld: 101 mg/dL — ABNORMAL HIGH (ref 70–99)
Potassium: 4.5 mmol/L (ref 3.5–5.1)
Sodium: 136 mmol/L (ref 135–145)

## 2020-11-19 LAB — PROTIME-INR
INR: 4.2 (ref 0.8–1.2)
Prothrombin Time: 40.5 seconds — ABNORMAL HIGH (ref 11.4–15.2)

## 2020-11-19 MED ORDER — LIDOCAINE 5 % EX OINT
TOPICAL_OINTMENT | Freq: Four times a day (QID) | CUTANEOUS | Status: DC
  Administered 2020-11-22 (×2): 1 via TOPICAL
  Filled 2020-11-19: qty 35.44

## 2020-11-19 MED ORDER — HYDROCORT-PRAMOXINE (PERIANAL) 2.5-1 % EX CREA
TOPICAL_CREAM | Freq: Four times a day (QID) | CUTANEOUS | Status: DC
  Administered 2020-11-22: 1 via RECTAL
  Filled 2020-11-19: qty 30

## 2020-11-19 MED ORDER — BELLADONNA ALKALOIDS-OPIUM 16.2-60 MG RE SUPP
1.0000 | Freq: Once | RECTAL | Status: AC
  Administered 2020-11-20: 1 via RECTAL
  Filled 2020-11-19: qty 1

## 2020-11-19 MED ORDER — POLYETHYLENE GLYCOL 3350 17 G PO PACK
17.0000 g | PACK | Freq: Two times a day (BID) | ORAL | Status: AC
  Administered 2020-11-19 – 2020-11-22 (×8): 17 g via ORAL
  Filled 2020-11-19 (×8): qty 1

## 2020-11-19 MED ORDER — DICYCLOMINE HCL 10 MG PO CAPS
10.0000 mg | ORAL_CAPSULE | Freq: Three times a day (TID) | ORAL | Status: DC
  Administered 2020-11-19: 10 mg via ORAL
  Filled 2020-11-19: qty 1

## 2020-11-19 NOTE — Progress Notes (Signed)
Urologist (Dr. Jeffie Pollock) at bedside  advised writer to remover foley catheter and do intermittent cath's four times per day if patient unable to void.

## 2020-11-19 NOTE — Progress Notes (Addendum)
PROGRESS NOTE    Clarence Hopkins   HQP:[id number]  DOB: April 22, 1930  PCP: Cassandria Anger, MD    DOA: 11/17/2020 LOS: 1    Brief Narrative / Hospital Course to Date:   No notes on file   Assessment & Plan   Principal Problem:   Rectal bleed Active Problems:   Protein C deficiency (HCC)   PULMONARY EMBOLISM, HX OF   Long term (current) use of anticoagulants   Hypertriglyceridemia   Rectal bleeding   Pressure injury of skin   Rectal bleeding /hematochezia-with supratherapeutic INR and recent fecal disimpaction, suspect due to minor trauma. Given oral vitamin K in the ED on admission. 10/6-7: Patient reports no bleeding since admission.   Hemoglobin stable.  --Hold warfarin --Monitor hemoglobin and transfuse if less than 7  Supratherapeutic INR -presented with INR 4.8 in setting of rectal bleeding.  INR still 4.2 today. --Hold warfarin --Daily INR --Resume once INR therapeutic per pharmacy dosing --Monitor closely for bleeding  Bladder versus rectal spasms -B&O suppository started by urology.  Trial of Bentyl ordered this AM, should help if this is rectal spasm. --Urology and GI consulted, see their recommendations  Urinary retention  -patient follows with urologist, Dr. Sandrea Matte. --Continue pain control per orders --Resume home Gemtesa (patient's own med) --Trial of Pyridium --Consider urology consult if bladder spasm pain not improving --Continue indwelling Foley --Outpatient urology follow-up  Left lower extremity wound s/p recent excision of skin cancer - WOC consulted.  See their wound care instructions.  History of PE History of protein C deficiency Chronically anticoagulated with warfarin. -- Warfarin on hold due to bleeding and supratherapeutic INR  Hypertriglyceridemia -not on home medication  Hypothyroidism -continue levothyroxine  BPH -continue tamsulosin and finasteride  Hx of metastatic thyroid cancer, skin cancer - follows with oncology,  dermatology  Patient BMI: Body mass index is 25.53 kg/m.   DVT prophylaxis: SCDs Start: 11/18/20 0113   Diet:  Diet Orders (From admission, onward)     Start     Ordered   11/19/20 1313  Diet full liquid Room service appropriate? Yes; Fluid consistency: Thin  Diet effective now       Question Answer Comment  Room service appropriate? Yes   Fluid consistency: Thin      11/19/20 1312              Code Status: Full Code   Subjective 11/19/20    Patient seen at bedside after Foley removed.  Leg dressing had just been changed and redressed.  Patient is having frequent severe pelvic spasms, unclear if bladder or rectal spasm.  No further bleeding has occurred.  No BM since admission.  Denies other acute complaints at this time.   Disposition Plan & Communication   Status is: Inpatient  Remains inpatient appropriate because: INR remains supratherapeutic, patient requires further monitoring due to rectal bleeding  Dispo: The patient is from: Home              Anticipated d/c is to: Home              Patient currently is not medically stable to d/c.   Difficult to place patient No    Family Communication: Wife and 2 daughters at bedside on rounds today 10/7.   Consults, Procedures, Significant Events   Consultants:  None  Procedures:  None  Antimicrobials:  Anti-infectives (From admission, onward)    None         Micro  Objective   Vitals:   11/18/20 2130 11/19/20 0105 11/19/20 0459 11/19/20 1057  BP: 120/68 (!) 111/59 128/83 124/78  Pulse: 67 (!) 47 66 66  Resp: 14 (!) 21 17 18   Temp: 98.3 F (36.8 C) 97.8 F (36.6 C) 98.4 F (36.9 C) 98.3 F (36.8 C)  TempSrc: Oral Oral Oral Axillary  SpO2: 95% 92% 95% 92%  Weight:      Height:        Intake/Output Summary (Last 24 hours) at 11/19/2020 1528 Last data filed at 11/19/2020 1254 Gross per 24 hour  Intake 960 ml  Output 400 ml  Net 560 ml   Filed Weights   11/17/20 1910  Weight: 93.9  kg    Physical Exam:  General exam: awake, alert, no acute distress Respiratory system: Lungs clear bilaterally, normal respiratory effort, on room air Cardiovascular system: normal S1/S2, RRR Gastrointestinal system: Abdomen soft and nontender. Central nervous system: A&O x3.  Normal speech, grossly nonfocal exam Extremities: Distal left lower extremity with fresh clean dry intact gauze dressing Genitourinary: Indwelling Foley has been removed Psychiatry: normal mood, congruent affect, judgement and insight appear normal  Labs   Data Reviewed: I have personally reviewed following labs and imaging studies  CBC: Recent Labs  Lab 11/15/20 2050 11/17/20 2115 11/18/20 0145 11/18/20 0808 11/18/20 1653 11/18/20 1832  WBC 7.8 7.8 6.4 6.0 5.6 7.2  NEUTROABS 6.5 5.7  --   --   --   --   HGB 12.4* 12.6* 11.2* 11.6* 11.7* 12.1*  HCT 39.7 40.1 35.0* 37.4* 37.5* 39.1  MCV 89.4 89.9 90.0 92.1 90.1 90.9  PLT 199 198 181 174 196 284   Basic Metabolic Panel: Recent Labs  Lab 11/15/20 2050 11/17/20 2115 11/18/20 0149 11/19/20 0554  NA 137 133* 139 136  K 4.2 4.1 3.8 4.5  CL 100 100 107 103  CO2 28 28 25 28   GLUCOSE 111* 96 85 101*  BUN 17 17 13 17   CREATININE 1.14 1.16 1.05 1.11  CALCIUM 9.2 9.0 8.4* 8.8*   GFR: Estimated Creatinine Clearance: 55.8 mL/min (by C-G formula based on SCr of 1.11 mg/dL). Liver Function Tests: Recent Labs  Lab 11/15/20 2050 11/17/20 2115 11/18/20 0149  AST 22 29 23   ALT 16 17 15   ALKPHOS 97 109 90  BILITOT 0.8 1.2 0.9  PROT 7.4 7.3 6.2*  ALBUMIN 4.1 3.8 3.1*   Recent Labs  Lab 11/15/20 2050  LIPASE 14   No results for input(s): AMMONIA in the last 168 hours. Coagulation Profile: Recent Labs  Lab 11/17/20 2115 11/18/20 0900 11/19/20 0554  INR 4.8* 4.7* 4.2*   Cardiac Enzymes: No results for input(s): CKTOTAL, CKMB, CKMBINDEX, TROPONINI in the last 168 hours. BNP (last 3 results) No results for input(s): PROBNP in the last 8760  hours. HbA1C: No results for input(s): HGBA1C in the last 72 hours. CBG: Recent Labs  Lab 11/17/20 1909  GLUCAP 83   Lipid Profile: No results for input(s): CHOL, HDL, LDLCALC, TRIG, CHOLHDL, LDLDIRECT in the last 72 hours. Thyroid Function Tests: No results for input(s): TSH, T4TOTAL, FREET4, T3FREE, THYROIDAB in the last 72 hours. Anemia Panel: No results for input(s): VITAMINB12, FOLATE, FERRITIN, TIBC, IRON, RETICCTPCT in the last 72 hours. Sepsis Labs: No results for input(s): PROCALCITON, LATICACIDVEN in the last 168 hours.  Recent Results (from the past 240 hour(s))  Resp Panel by RT-PCR (Flu A&B, Covid) Nasopharyngeal Swab     Status: None   Collection Time: 11/18/20 12:02  AM   Specimen: Nasopharyngeal Swab; Nasopharyngeal(NP) swabs in vial transport medium  Result Value Ref Range Status   SARS Coronavirus 2 by RT PCR NEGATIVE NEGATIVE Final    Comment: (NOTE) SARS-CoV-2 target nucleic acids are NOT DETECTED.  The SARS-CoV-2 RNA is generally detectable in upper respiratory specimens during the acute phase of infection. The lowest concentration of SARS-CoV-2 viral copies this assay can detect is 138 copies/mL. A negative result does not preclude SARS-Cov-2 infection and should not be used as the sole basis for treatment or other patient management decisions. A negative result may occur with  improper specimen collection/handling, submission of specimen other than nasopharyngeal swab, presence of viral mutation(s) within the areas targeted by this assay, and inadequate number of viral copies(<138 copies/mL). A negative result must be combined with clinical observations, patient history, and epidemiological information. The expected result is Negative.  Fact Sheet for Patients:  EntrepreneurPulse.com.au  Fact Sheet for Healthcare Providers:  IncredibleEmployment.be  This test is no t yet approved or cleared by the [place] FDA  and  has been authorized for detection and/or diagnosis of SARS-CoV-2 by FDA under an Emergency Use Authorization (EUA). This EUA will remain  in effect (meaning this test can be used) for the duration of the COVID-19 declaration under Section 564(b)(1) of the Act, 21 U.S.C.section 360bbb-3(b)(1), unless the authorization is terminated  or revoked sooner.       Influenza A by PCR NEGATIVE NEGATIVE Final   Influenza B by PCR NEGATIVE NEGATIVE Final    Comment: (NOTE) The Xpert Xpress SARS-CoV-2/FLU/RSV plus assay is intended as an aid in the diagnosis of influenza from Nasopharyngeal swab specimens and should not be used as a sole basis for treatment. Nasal washings and aspirates are unacceptable for Xpert Xpress SARS-CoV-2/FLU/RSV testing.  Fact Sheet for Patients: EntrepreneurPulse.com.au  Fact Sheet for Healthcare Providers: IncredibleEmployment.be  This test is not yet approved or cleared by the [place] FDA and has been authorized for detection and/or diagnosis of SARS-CoV-2 by FDA under an Emergency Use Authorization (EUA). This EUA will remain in effect (meaning this test can be used) for the duration of the COVID-19 declaration under Section 564(b)(1) of the Act, 21 U.S.C. section 360bbb-3(b)(1), unless the authorization is terminated or revoked.  Performed at [facility], [place] [address] [place], [place] [address]       Imaging Studies   No results found.   Medications   Scheduled Meds:  Chlorhexidine Gluconate Cloth  6 each Topical Daily   finasteride  5 mg Oral Daily   hydrocortisone-pramoxine   Rectal QID   levothyroxine  175 mcg Oral QAC breakfast   lidocaine   Topical Q6H   phenazopyridine  200 mg Oral TID WC   polyethylene glycol  17 g Oral BID   tamsulosin  0.4 mg Oral QPC supper   Vibegron  1 each Oral Daily   Continuous Infusions:     LOS: 1 day    Time spent: 30 minutes      Ezekiel Slocumb, DO [facility]  11/19/2020, 3:28 PM      If 7PM-7AM, please contact night-coverage. How to contact the [facility] Attending or Consulting provider [place] or covering provider during after hours [place], for this patient?    Check the care team in [facility] and look for a) attending/consulting [facility] provider listed and b) the [facility] team listed Log into [email or web address] and use [place]'s universal password to access. If you do  not have the password, please contact the hospital operator. Locate the [facility] provider you are looking for under [facility] and page to a number that you can be directly reached. If you still have difficulty reaching the provider, please page the [facility] (Director on Call) for the Hospitalists listed on amion for assistance.

## 2020-11-19 NOTE — Consult Note (Signed)
I have placed a request via Secure Chat to [facility] requesting photos of the wound areas of concern to be placed in the EMR.   [place], [place], [place]

## 2020-11-19 NOTE — Consult Note (Signed)
WOC Nurse Consult Note: Patient receiving care in [place]. Reason for Consult: LLE dermatology surgical skin cancer removal site, just above the achilles tendon. Wound type: surgical site Pressure Injury POA: Yes/No/NA Measurement: To be provided by the bedside RN in the flowsheet section  Wound bed: 75% yellow, 25% pink Drainage (amount, consistency, odor) heavy amount of drainage on the existing dressing that was changed 2 days ago. Periwound: intact Dressing procedure/placement/frequency: According to the patient, they had been instructed to wash the wound with half strength vinegar and water, then place a non-adherent dressing on it, daily. Via the primary RN, Joelene Millin, whom I was speaking with on the phone as she assessed the wound, I explained we don't have the half strength vinegar solution, and will need to cleanse with saline. She then explained this to the patient. Wash wound on posterior left lower leg (dermatology surgical site) with saline. Pat dry. Place a non-adherent telfa pad over the wound, secure with a few turns of kerlix.  Thank you for the consult.  Discussed plan of care with the patient and bedside nurse.  [place] nurse will not follow at this time.  Please re-consult the [place] team if needed.  Val Riles, RN, MSN, CWOCN, CNS-BC, pager [phone]

## 2020-11-19 NOTE — Progress Notes (Signed)
Patient complaining of intermittent bladder pain/spasm  8/10.  It has been going on for several days according to daughter. Writer discussed with patient and daughter Arby Barrette) the current pain regimen and interventions that are being implemented for his pain. No further medication could be administered at that time. Daughter asked to speak with attending  physician because she would like a Urology consult. Writer communicated family/patient needs to Dr. Arbutus Ped whom consulted urology.

## 2020-11-19 NOTE — Consult Note (Addendum)
Consultation  Referring Provider:  TRH/ Grifftih DO Primary Care Physician:  Cassandria Anger, MD Primary Gastroenterologist:  none  Reason for Consultation:  Rectal bleeding  HPI: Clarence Hopkins is a 87 y.o. male who was admitted on 11/17/2020 with complaints of rectal bleeding.  He had been in the emergency room on 11/15/2020 with urinary retention and constipation.  CT of the abdomen pelvis was done which showed multiple noncalcified lung nodules and lung masses of various sizes, the largest was 3.5 cm consistent with his known metastatic disease, multiple hepatic cysts, there is mild inflammatory stranding surround the urinary bladder and large amount of stool in the distal sigmoid and rectum, no associated inflammatory changes or wall thickening. He underwent manual disimpaction by the ER physician and was discharged home on 11/15/2020, then returned with rectal bleeding on 11/17/2020. Hemoglobin was 12.6 on 11/15/2020, stable at 12.6 on 11/17/2020.  INR 4.8. Yesterday INR 4.7 hemoglobin 11.7 Today INR 4.2, no hemoglobin done.  Patient has history of known metastatic papillary thyroid cancer, is followed by Dr. Marin Olp, prior history of melanoma, history of multifocal pneumonia, chronic anticoagulation with Coumadin with prior history of PE and history of protein C deficiency, and is being treated for a pressure wound on his right lower extremity.  He has been continuing to complain of pain and discomfort which she has a hard time describing.  It is felt this may be secondary to bladder spasms.  He now has indwelling Foley and is on Pyridium as well as Bentyl.  He tells me that he is also having significant rectal pain.  He is unaware whether he has had any bleeding today.  He is a very poor historian currently, though very pleasant he is confused about most of the events over the past few days.  It sounds as if he has had chronic problems with constipation and he knows he was very constipated  when he came into the hospital.  He says he had been using mineral oil as needed at home. He does not think that he has had prior colonoscopy.  He has been able to eat, no complaints of abdominal pain currently    Past Medical History:  Diagnosis Date   Arthritis    Cancer ([place]) 2003   melanoma  L ear   GERD (gastroesophageal reflux disease)    Hypothyroidism    post op   Lung mass    Pulmonary embolism ([place]) 2005 & 2007   protein C deficiency    Past Surgical History:  Procedure Laterality Date   BRONCHIAL BRUSHINGS  07/15/2019   Procedure: BRONCHIAL BRUSHINGS;  Surgeon: Rigoberto Noel, MD;  Location: [place];  Service: Cardiopulmonary;;   BRONCHIAL WASHINGS  07/15/2019   Procedure: BRONCHIAL WASHINGS;  Surgeon: Rigoberto Noel, MD;  Location: [facility];  Service: Cardiopulmonary;;   CARDIAC CATHETERIZATION     > 20 years ago " everything was okay"    CERVICAL FUSION      X 2; Dr Vertell Limber   COLONOSCOPY     ENDOBRONCHIAL ULTRASOUND N/A 07/15/2019   Procedure: ENDOBRONCHIAL ULTRASOUND;  Surgeon: Rigoberto Noel, MD;  Location: [place];  Service: Cardiopulmonary;  Laterality: N/A;   FINE NEEDLE ASPIRATION  07/15/2019   Procedure: FINE NEEDLE ASPIRATION (FNA) LINEAR;  Surgeon: Rigoberto Noel, MD;  Location: [place];  Service: Cardiopulmonary;;   FINGER ARTHROPLASTY Right 04/17/2013   Procedure: IMPLANT ARTHROPLASTY RIGHT INDEX;  Surgeon: Cammie Sickle., MD;  Location:  [place];  Service: Orthopedics;  Laterality: Right;   LUMBAR LAMINECTOMY  2010   Dr Becky Sax   MELANOMA EXCISION  2003   L ear   THYROIDECTOMY  1971   cytology indefinite   TONSILLECTOMY     TOTAL KNEE ARTHROPLASTY  2004   left   VIDEO BRONCHOSCOPY N/A 07/15/2019   Procedure: VIDEO BRONCHOSCOPY WITHOUT FLUORO;  Surgeon: Rigoberto Noel, MD;  Location: [place];  Service: Cardiopulmonary;  Laterality: N/A;   WISDOM TOOTH EXTRACTION      Prior to Admission medications   Medication  Sig Start Date End Date Taking? Authorizing Provider  finasteride (PROSCAR) 5 MG tablet Take 5 mg by mouth daily. 11/05/20  Yes [provider]  levothyroxine (SYNTHROID) 175 MCG tablet Take 175 mcg by mouth daily before breakfast.   Yes [provider]  polyvinyl alcohol (LIQUIFILM TEARS) 1.4 % ophthalmic solution Place 1 drop into both eyes as needed for dry eyes.   Yes [provider]  tamsulosin (FLOMAX) 0.4 MG CAPS capsule TAKE 1 CAPSULE BY MOUTH DAILY AFTER SUPPER Patient taking differently: Take 0.4 mg by mouth daily after supper. 08/05/20  Yes Plotnikov, Evie Lacks, MD  Vibegron (GEMTESA) 75 MG TABS Take 75 mg by mouth daily.   Yes [provider]  warfarin (COUMADIN) 5 MG tablet TAKE 1 TABLET DAILY OR AS DIRECTED BY ANTICOAGULATION CLINIC Patient taking differently: Take 2.5-5 mg by mouth See admin instructions. 2.5mg  on Thursday, Saturday, Sunday 5mg  on Friday 09/02/20  Yes Plotnikov, Evie Lacks, MD  bisacodyl (DULCOLAX) 10 MG suppository Place 1 suppository (10 mg total) rectally as needed for moderate constipation. Patient not taking: Reported on 11/18/2020 11/16/20   Maudie Flakes, MD    Current Facility-Administered Medications  Medication Dose Route Frequency Provider Last Rate Last Admin   acetaminophen (TYLENOL) tablet 650 mg  650 mg Oral Q6H PRN Ezekiel Slocumb, DO   650 mg at 11/18/20 1038   Chlorhexidine Gluconate Cloth 2 % PADS 6 each  6 each Topical Daily Nicole Kindred A, DO   6 each at 11/19/20 1004   dicyclomine (BENTYL) capsule 10 mg  10 mg Oral TID AC & HS Nicole Kindred A, DO   10 mg at 11/19/20 1321   finasteride (PROSCAR) tablet 5 mg  5 mg Oral Daily Nicole Kindred A, DO   5 mg at 11/19/20 0957   hydrocortisone-pramoxine (ANALPRAM-HC) 2.5-1 % rectal cream   Rectal QID Esterwood, Amy S, PA-C       levothyroxine (SYNTHROID) tablet 175 mcg  175 mcg Oral QAC breakfast Nicole Kindred A, DO   175 mcg at 11/19/20 0500   lidocaine  (XYLOCAINE) 5 % ointment   Topical Q6H Esterwood, Amy S, PA-C       metoprolol tartrate (LOPRESSOR) injection 5 mg  5 mg Intravenous Q6H PRN Gala Romney L, MD       morphine 2 MG/ML injection 2 mg  2 mg Intravenous Q2H PRN Elwyn Reach, MD   2 mg at 11/19/20 1211   ondansetron (ZOFRAN) tablet 4 mg  4 mg Oral Q6H PRN Elwyn Reach, MD       Or   ondansetron (ZOFRAN) injection 4 mg  4 mg Intravenous Q6H PRN Elwyn Reach, MD       oxyCODONE (Oxy IR/ROXICODONE) immediate release tablet 5 mg  5 mg Oral Q6H PRN Nicole Kindred A, DO   5 mg at 11/19/20 0730   phenazopyridine (PYRIDIUM) tablet  200 mg  200 mg Oral TID WC Nicole Kindred A, DO   200 mg at 11/19/20 1231   polyethylene glycol (MIRALAX / GLYCOLAX) packet 17 g  17 g Oral BID Esterwood, Amy S, PA-C       polyvinyl alcohol (LIQUIFILM TEARS) 1.4 % ophthalmic solution 1 drop  1 drop Both Eyes PRN Nicole Kindred A, DO       tamsulosin (FLOMAX) capsule 0.4 mg  0.4 mg Oral QPC supper Nicole Kindred A, DO   0.4 mg at 11/18/20 2129   Vibegron TABS 1 each  1 each Oral Daily Nicole Kindred A, DO   1 each at 11/19/20 1036    Allergies as of 11/17/2020   (No Known Allergies)    Family History  Problem Relation Age of Onset   Breast cancer Mother    Alzheimer's disease Father    Breast cancer Maternal Grandmother    Breast cancer Sister    Cancer Sister    Diabetes Neg Hx    Stroke Neg Hx    Heart disease Neg Hx     Social History   Socioeconomic History   Marital status: Married    Spouse name: Not on file   Number of children: 4   Years of education: 15   Highest education level: Not on file  Occupational History   Not on file  Tobacco Use   Smoking status: Former    Packs/day: 0.75    Years: 41.00    Pack years: 30.75    Types: Cigarettes    Quit date: 02/13/1993    Years since quitting: 27.7   Smokeless tobacco: Never   Tobacco comments:    smoked 1956-1995, up to 1/2 ppd  Vaping Use   Vaping Use: Never  used  Substance and Sexual Activity   Alcohol use: Yes    Comment:  Vodka 2 oz/ night   Drug use: No   Sexual activity: Not on file  Other Topics Concern   Not on file  Social History Narrative   Fun: Plays golf   Denies abuse and feels safe at home   Social Determinants of Health   Financial Resource Strain: Not on file  Food Insecurity: Not on file  Transportation Needs: Not on file  Physical Activity: Not on file  Stress: Not on file  Social Connections: Not on file  Intimate Partner Violence: Not on file    Review of Systems: Pertinent positive and negative review of systems were noted in the above HPI section.  All other review of systems was otherwise negative.  Endo:  Denies any problems with DM, thyroid, adrenal function.  Physical Exam: Vital signs in last 24 hours: Temp:  [97.6 F (36.4 C)-98.4 F (36.9 C)] 98.3 F (36.8 C) (10/07 1057) Pulse Rate:  [47-67] 66 (10/07 1057) Resp:  [14-21] 18 (10/07 1057) BP: (111-128)/(59-83) 124/78 (10/07 1057) SpO2:  [92 %-96 %] 92 % (10/07 1057) Last BM Date: 11/18/20 General:   Alert,  Well-developed, elderly white male pleasant and cooperative in NAD Head:  Normocephalic and atraumatic. Eyes:  Sclera clear, no icterus.   Conjunctiva pink. Ears:  Normal auditory acuity. Nose:  No deformity, discharge,  or lesions. Mouth:  No deformity or lesions.   Neck:  Supple; no masses or thyromegaly. Lungs:  Clear throughout to auscultation.   No wheezes, crackles, or rhonchi.  Heart:  Regular rate and rhythm; no murmurs, clicks, rubs,  or gallops. Abdomen:  Soft, nontender, bowel sounds are  present, no palpable mass or hepatosplenomegaly Rectal: Small amount of liquid stool on the bed sheets, on digital exam he is exquisitely tender, no definite impaction palpable, mucoid stool with scant heme on glove Msk:  Symmetrical without gross deformities. . Pulses:  Normal pulses noted. Extremities:  Without clubbing or edema. Neurologic:   Alert and  oriented X2  .  Pleasant but somewhat confused Skin:  Intact without significant lesions or rashes.. Psych:  Alert and cooperative.   Intake/Output from previous day: 10/06 0701 - 10/07 0700 In: 1123.4 [I.V.:1123.4] Out: 100 [Urine:100] Intake/Output this shift: Total I/O In: 960 [P.O.:960] Out: 300 [Urine:300]  Lab Results: Recent Labs    11/18/20 0808 11/18/20 1653 11/18/20 1832  WBC 6.0 5.6 7.2  HGB 11.6* 11.7* 12.1*  HCT 37.4* 37.5* 39.1  PLT 174 196 193   BMET Recent Labs    11/17/20 2115 11/18/20 0149 11/19/20 0554  NA 133* 139 136  K 4.1 3.8 4.5  CL 100 107 103  CO2 28 25 28   GLUCOSE 96 85 101*  BUN 17 13 17   CREATININE 1.16 1.05 1.11  CALCIUM 9.0 8.4* 8.8*   LFT Recent Labs    11/18/20 0149  PROT 6.2*  ALBUMIN 3.1*  AST 23  ALT 15  ALKPHOS 90  BILITOT 0.9   PT/INR Recent Labs    11/18/20 0900 11/19/20 0554  LABPROT 44.1* 40.5*  INR 4.7* 4.2*     IMPRESSION:   #55 87 year old white male with metastatic papillary thyroid cancer to the lungs #2 urinary retention #3 very recent fecal impaction, manual disimpaction in the ER 11/15/2020 This is in the setting of what sounds like chronic constipation, and was likely contributing to the urinary retention No definite palpable impaction on exam today  #4 hematochezia-low-grade in the setting of supratherapeutic INR, and fecal impaction/disimpaction-patient is also having persistent rectal pain Bleeding is very likely secondary to anal rectal inflammation and he may have a stercoral ulceration from the fecal impaction No mass or wall thickening noted on CT scan  #5 mild anemia secondary to above #6 chronic anticoagulation-on Coumadin secondary to protein C deficiency, prior pulmonary embolism  #7 BPH #8 Confusion-probable toxic metabolic encephalopathy secondary to illness, and hospitalization in this very elderly gentleman   PLAN: Plain abdominal film today, assess for any  persistent impaction or obstipation-if he has any significant retained stool will need a bowel purge with movie prep or GoLytely Start MiraLAX 17 g in 8 ounces of water twice daily Have started Analpram cream 4 times daily and add lidocaine 5% ointment to be placed at the same time 4 times daily Continue to hold Coumadin and allow INR to drift At this time he is not having any high-grade rectal bleeding, he is a poor candidate for endoscopic evaluation and would hope to avoid Serial hemoglobins have ordered daily starting tomorrow morning  Bentyl/dicyclomine contraindicated with BPH, could be contributing to confusion, will DC  GI will follow, Drs. Mann & [place] covering this weekend   Nicoletta Ba PA-C 11/19/2020, 3:01 PM     Attending Physician Note   I have taken a history, reviewed the chart and examined the patient. I personally saw the patient and performed a substantive portion of this encounter, including a complete performance of at least one of the key components, in conjunction with the APP. I agree with the APP's note, impression and recommendations.   *Low volume hematochezia, recent fecal impaction, supratherapeutic INR. Suspect anal/rectal irritation, stercoral ulcer or hemorrhoidal  bleeding. Trend CBC, PT/INR. He is a poor candidate for flex sigmoidoscopy or colonoscopy. Begin Analpram cream qid and lidocaine ointment qid.  *Chronic anticoagulation on Coumadin for Protein C deficiency, history of a PE with supratherapeutic INR.  *Chronic constipation, recent impaction. Check KUB today. Begin Miralax bid *Anemia. Trend CBC. *Urinary retention *Metastatic papillary thyroid cancer to the lungs  Drs. Mann & [place] covering for the weekend.   Lucio Edward, MD [facility] See [facility], [facility], for our on call provider

## 2020-11-19 NOTE — Consult Note (Signed)
Subjective:   Consult requested by Dr. Nicole Kindred.  Clarence Hopkins was admitted to the hospital on Weds with persistent episodic pelvic pain that has been felt to be secondary to bladder spasms related to the foley placed on  10/3 in the ER for recurrent retention from BPH with BOO.   He was also found to have a possible fecal impaction and some rectal bleeding and was disimpacted on 10/3.   I saw him in the office earlier this week and gave him Gemtesa for the bladder spasms in an attempt to avoid an anticholinergic with the bowel issues.    He didn't get relief and has spasmotic pain about every 8 minutes.   He has a history of a rising PSA that was up to 9.86 in 1/21 but that hasn't been checked since.   ROS:  Review of Systems  Gastrointestinal:  Positive for blood in stool and constipation.  Genitourinary:  Positive for urgency.       Pelvic pain that is suggestive of bladder spasm.    No Known Allergies  Past Medical History:  Diagnosis Date   Arthritis    Cancer ([place]) 2003   melanoma  L ear   GERD (gastroesophageal reflux disease)    Hypothyroidism    post op   Lung mass    Pulmonary embolism ([place]) 2005 & 2007   protein C deficiency    Past Surgical History:  Procedure Laterality Date   BRONCHIAL BRUSHINGS  07/15/2019   Procedure: BRONCHIAL BRUSHINGS;  Surgeon: Rigoberto Noel, MD;  Location: [place];  Service: Cardiopulmonary;;   BRONCHIAL WASHINGS  07/15/2019   Procedure: BRONCHIAL WASHINGS;  Surgeon: Rigoberto Noel, MD;  Location: [place];  Service: Cardiopulmonary;;   CARDIAC CATHETERIZATION     > 20 years ago " everything was okay"    CERVICAL FUSION      X 2; Dr Vertell Limber   COLONOSCOPY     ENDOBRONCHIAL ULTRASOUND N/A 07/15/2019   Procedure: ENDOBRONCHIAL ULTRASOUND;  Surgeon: Rigoberto Noel, MD;  Location: [place];  Service: Cardiopulmonary;  Laterality: N/A;   FINE NEEDLE ASPIRATION  07/15/2019   Procedure: FINE NEEDLE ASPIRATION (FNA) LINEAR;  Surgeon: Rigoberto Noel, MD;  Location: [place];  Service: Cardiopulmonary;;   FINGER ARTHROPLASTY Right 04/17/2013   Procedure: IMPLANT ARTHROPLASTY RIGHT INDEX;  Surgeon: Cammie Sickle., MD;  Location: [place];  Service: Orthopedics;  Laterality: Right;   LUMBAR LAMINECTOMY  2010   Dr Becky Sax   MELANOMA EXCISION  2003   L ear   THYROIDECTOMY  1971   cytology indefinite   TONSILLECTOMY     TOTAL KNEE ARTHROPLASTY  2004   left   VIDEO BRONCHOSCOPY N/A 07/15/2019   Procedure: VIDEO BRONCHOSCOPY WITHOUT FLUORO;  Surgeon: Rigoberto Noel, MD;  Location: [place];  Service: Cardiopulmonary;  Laterality: N/A;   WISDOM TOOTH EXTRACTION      Social History   Socioeconomic History   Marital status: Married    Spouse name: Not on file   Number of children: 4   Years of education: 15   Highest education level: Not on file  Occupational History   Not on file  Tobacco Use   Smoking status: Former    Packs/day: 0.75    Years: 41.00    Pack years: 30.75    Types: Cigarettes    Quit date: 02/13/1993    Years since quitting: 27.7   Smokeless tobacco: Never   Tobacco comments:  smoked 1956-1995, up to 1/2 ppd  Vaping Use   Vaping Use: Never used  Substance and Sexual Activity   Alcohol use: Yes    Comment:  Vodka 2 oz/ night   Drug use: No   Sexual activity: Not on file  Other Topics Concern   Not on file  Social History Narrative   Fun: Plays golf   Denies abuse and feels safe at home   Social Determinants of Health   Financial Resource Strain: Not on file  Food Insecurity: Not on file  Transportation Needs: Not on file  Physical Activity: Not on file  Stress: Not on file  Social Connections: Not on file  Intimate Partner Violence: Not on file    Family History  Problem Relation Age of Onset   Breast cancer Mother    Alzheimer's disease Father    Breast cancer Maternal Grandmother    Breast cancer Sister    Cancer Sister    Diabetes Neg Hx    Stroke  Neg Hx    Heart disease Neg Hx     Anti-infectives: Anti-infectives (From admission, onward)    None       Current Facility-Administered Medications  Medication Dose Route Frequency Provider Last Rate Last Admin   acetaminophen (TYLENOL) tablet 650 mg  650 mg Oral Q6H PRN Nicole Kindred A, DO   650 mg at 11/18/20 1038   Chlorhexidine Gluconate Cloth 2 % PADS 6 each  6 each Topical Daily Nicole Kindred A, DO   6 each at 11/19/20 1004   finasteride (PROSCAR) tablet 5 mg  5 mg Oral Daily Nicole Kindred A, DO   5 mg at 11/19/20 0957   hydrocortisone-pramoxine (ANALPRAM-HC) 2.5-1 % rectal cream   Rectal QID Esterwood, Amy S, PA-C       levothyroxine (SYNTHROID) tablet 175 mcg  175 mcg Oral QAC breakfast Nicole Kindred A, DO   175 mcg at 11/19/20 0500   lidocaine (XYLOCAINE) 5 % ointment   Topical Q6H Esterwood, Amy S, PA-C       metoprolol tartrate (LOPRESSOR) injection 5 mg  5 mg Intravenous Q6H PRN Elwyn Reach, MD       morphine 2 MG/ML injection 2 mg  2 mg Intravenous Q2H PRN Elwyn Reach, MD   2 mg at 11/19/20 1531   ondansetron (ZOFRAN) tablet 4 mg  4 mg Oral Q6H PRN Elwyn Reach, MD       Or   ondansetron (ZOFRAN) injection 4 mg  4 mg Intravenous Q6H PRN Elwyn Reach, MD       oxyCODONE (Oxy IR/ROXICODONE) immediate release tablet 5 mg  5 mg Oral Q6H PRN Nicole Kindred A, DO   5 mg at 11/19/20 0730   phenazopyridine (PYRIDIUM) tablet 200 mg  200 mg Oral TID WC Nicole Kindred A, DO   200 mg at 11/19/20 1231   polyethylene glycol (MIRALAX / GLYCOLAX) packet 17 g  17 g Oral BID Esterwood, Amy S, PA-C   17 g at 11/19/20 1547   polyvinyl alcohol (LIQUIFILM TEARS) 1.4 % ophthalmic solution 1 drop  1 drop Both Eyes PRN Nicole Kindred A, DO       tamsulosin (FLOMAX) capsule 0.4 mg  0.4 mg Oral QPC supper Nicole Kindred A, DO   0.4 mg at 11/18/20 2129   Vibegron TABS 1 each  1 each Oral Daily Nicole Kindred A, DO   1 each at 11/19/20 1036     Objective: Vital  signs  in last 24 hours: BP 124/78 (BP Location: Left Arm)   Pulse 66   Temp 98.3 F (36.8 C) (Axillary)   Resp 18   Ht 6' 3.5" (1.918 m)   Wt 93.9 kg   SpO2 92%   BMI 25.53 kg/m   Intake/Output from previous day: 10/06 0701 - 10/07 0700 In: 1123.4 [I.V.:1123.4] Out: 100 [Urine:100] Intake/Output this shift: Total I/O In: 960 [P.O.:960] Out: 300 [Urine:300]   Physical Exam Vitals reviewed.  Constitutional:      Appearance: Normal appearance.  Abdominal:     General: Abdomen is flat.     Tenderness: There is abdominal tenderness (suprapubic tenderness.).  Genitourinary:    Comments: He has a foley indwelling draining pyridium stained urine.  The penis is normal. AP.  There is stool leakage from the anus with circumferential tenderness that severe.  There is soft stool in the vault.  The sphincter tone is reduced.   Prostate large, smooth and firm. SV non-palpable.  Neurological:     Mental Status: He is alert.    Lab Results:  Results for orders placed or performed during the hospital encounter of 11/17/20 (from the past 24 hour(s))  CBC     Status: Abnormal   Collection Time: 11/18/20  6:32 PM  Result Value Ref Range   WBC 7.2 4.0 - 10.5 K/uL   RBC 4.30 4.22 - 5.81 MIL/uL   Hemoglobin 12.1 (L) 13.0 - 17.0 g/dL   HCT 39.1 39.0 - 52.0 %   MCV 90.9 80.0 - 100.0 fL   MCH 28.1 26.0 - 34.0 pg   MCHC 30.9 30.0 - 36.0 g/dL   RDW 13.9 11.5 - 15.5 %   Platelets 193 150 - 400 K/uL   nRBC 0.0 0.0 - 0.2 %  Protime-INR     Status: Abnormal   Collection Time: 11/19/20  5:54 AM  Result Value Ref Range   Prothrombin Time 40.5 (H) 11.4 - 15.2 seconds   INR 4.2 (HH) 0.8 - 1.2  Basic metabolic panel     Status: Abnormal   Collection Time: 11/19/20  5:54 AM  Result Value Ref Range   Sodium 136 135 - 145 mmol/L   Potassium 4.5 3.5 - 5.1 mmol/L   Chloride 103 98 - 111 mmol/L   CO2 28 22 - 32 mmol/L   Glucose, Bld 101 (H) 70 - 99 mg/dL   BUN 17 8 - 23 mg/dL   Creatinine, Ser  1.11 0.61 - 1.24 mg/dL   Calcium 8.8 (L) 8.9 - 10.3 mg/dL   GFR, Estimated >60 >60 mL/min   Anion gap 5 5 - 15    BMET Recent Labs    11/18/20 0149 11/19/20 0554  NA 139 136  K 3.8 4.5  CL 107 103  CO2 25 28  GLUCOSE 85 101*  BUN 13 17  CREATININE 1.05 1.11  CALCIUM 8.4* 8.8*   PT/INR Recent Labs    11/18/20 0900 11/19/20 0554  LABPROT 44.1* 40.5*  INR 4.7* 4.2*   ABG No results for input(s): PHART, HCO3 in the last 72 hours.  Invalid input(s): PCO2, PO2  Studies/Results: No results found.   Assessment/Plan: Pelvic pain that is spasmotic and could be bladder spasm or possible anal spasm based on the rectal exam.   I have ordered B&O suppositories and will have the foley removed to eliminate a focus of irritation, but he will need CIC q6hrs prn as he is unlikely to be able to void.  He could  continue the [place].    Elevated PSA with BPH and BOO.  There is a chance he could have prostate cancer contributing to his obstruction but with his elevated INR and acute symptoms and prostate biopsy wouldn't be prudent.   I would continue the finasteride and tamsulosin for now.   We could consider a repeat PSA but it is likely to be falsely elevated from the recent retention and catheterization.          No follow-ups on file.    CC: Dr. Nicole Kindred.       Irine Seal 11/19/2020 [id number]

## 2020-11-19 NOTE — Progress Notes (Signed)
Patient unable to void after foley removal about 10hours, bladder scan 219ml, in and out cath 462ml out. Will continue to assess patient.

## 2020-11-20 DIAGNOSIS — K625 Hemorrhage of anus and rectum: Secondary | ICD-10-CM | POA: Diagnosis not present

## 2020-11-20 LAB — PROTIME-INR
INR: 4.6 (ref 0.8–1.2)
Prothrombin Time: 43.2 seconds — ABNORMAL HIGH (ref 11.4–15.2)

## 2020-11-20 LAB — HEMOGLOBIN AND HEMATOCRIT, BLOOD
HCT: 36.9 % — ABNORMAL LOW (ref 39.0–52.0)
Hemoglobin: 11.8 g/dL — ABNORMAL LOW (ref 13.0–17.0)

## 2020-11-20 MED ORDER — BELLADONNA ALKALOIDS-OPIUM 16.2-30 MG RE SUPP: 1.0000 | Freq: Four times a day (QID) | RECTAL | Status: DC | PRN

## 2020-11-20 MED ORDER — BISACODYL 5 MG PO TBEC
5.0000 mg | DELAYED_RELEASE_TABLET | Freq: Every day | ORAL | Status: DC | PRN
  Administered 2020-11-20: 5 mg via ORAL
  Filled 2020-11-20: qty 1

## 2020-11-20 MED ORDER — SORBITOL 70 % SOLN
300.0000 mL | TOPICAL_OIL | Freq: Two times a day (BID) | ORAL | Status: DC
  Administered 2020-11-20 – 2020-11-23 (×5): 300 mL via RECTAL
  Filled 2020-11-20 (×8): qty 90

## 2020-11-20 MED ORDER — DICYCLOMINE HCL 20 MG PO TABS
20.0000 mg | ORAL_TABLET | Freq: Three times a day (TID) | ORAL | Status: DC
  Administered 2020-11-20 – 2020-11-23 (×10): 20 mg via ORAL
  Filled 2020-11-20 (×13): qty 1

## 2020-11-20 MED ORDER — SENNOSIDES-DOCUSATE SODIUM 8.6-50 MG PO TABS
1.0000 | ORAL_TABLET | Freq: Two times a day (BID) | ORAL | Status: DC
  Administered 2020-11-20 – 2020-11-23 (×8): 1 via ORAL
  Filled 2020-11-20 (×9): qty 1

## 2020-11-20 MED ORDER — DICYCLOMINE HCL 10 MG PO CAPS
10.0000 mg | ORAL_CAPSULE | Freq: Three times a day (TID) | ORAL | Status: DC
  Administered 2020-11-20 (×2): 10 mg via ORAL
  Filled 2020-11-20 (×2): qty 1

## 2020-11-20 MED ORDER — PHYTONADIONE 5 MG PO TABS
5.0000 mg | ORAL_TABLET | Freq: Once | ORAL | Status: AC
  Administered 2020-11-20: 5 mg via ORAL
  Filled 2020-11-20: qty 1

## 2020-11-20 NOTE — Progress Notes (Addendum)
PROGRESS NOTE    Clarence Hopkins   UEA:[id number]  DOB: Mar 09, 1930  PCP: Cassandria Anger, MD    DOA: 11/17/2020 LOS: 2    Brief Narrative / Hospital Course to Date:   Pt admitted with rectal bleeding in the setting of supratherapeutic INR and recent fecal manual disimpaction.  Also with severely painful rectal vs bladder spasms that have been difficult to get controlled.  GI and Urology are consulted.  Assessment & Plan   Principal Problem:   Rectal bleed Active Problems:   Protein C deficiency (HCC)   PULMONARY EMBOLISM, HX OF   Long term (current) use of anticoagulants   Hypertriglyceridemia   Rectal bleeding   Pressure injury of skin   Rectal bleeding /hematochezia-with supratherapeutic INR and recent fecal disimpaction, suspect due to minor trauma. Given oral vitamin K in the ED on admission. 10/6-8: Patient reports no bleeding since admission.   Hemoglobin stable.  --Hold warfarin --Monitor hemoglobin and transfuse if less than 7  Supratherapeutic INR -presented with INR 4.8 in setting of rectal bleeding.   10/8: INR up to 4.6 --5 mg PO vitamin K today --Hold warfarin --Daily INR --Resume once INR therapeutic per pharmacy dosing --Monitor closely for bleeding  Bladder versus rectal spasms -B&O suppository started by urology.  Trial of Bentyl ordered this AM, should help if this is rectal spasm. --Urology and GI consulted, see their recommendations  Urinary retention  -patient follows with urologist, Dr. Sandrea Matte. --Continue pain control per orders --Resume home Gemtesa (patient's own med) --Trial of Pyridium --Consider urology consult if bladder spasm pain not improving --Continue indwelling Foley --Outpatient urology follow-up  Left lower extremity wound s/p recent excision of skin cancer - WOC consulted.  See their wound care instructions.  History of PE History of protein C deficiency Chronically anticoagulated with warfarin. -- Warfarin on hold due  to bleeding and supratherapeutic INR  Hypertriglyceridemia -not on home medication  Hypothyroidism -continue levothyroxine  BPH -continue tamsulosin and finasteride  Hx of metastatic thyroid cancer, skin cancer - follows with oncology, dermatology  Patient BMI: Body mass index is 25.53 kg/m.   DVT prophylaxis: SCDs Start: 11/18/20 0113   Diet:  Diet Orders (From admission, onward)     Start     Ordered   11/19/20 1313  Diet full liquid Room service appropriate? Yes; Fluid consistency: Thin  Diet effective now       Question Answer Comment  Room service appropriate? Yes   Fluid consistency: Thin      11/19/20 1312              Code Status: Full Code   Subjective 11/20/20    Patient seen with wife at bedside today.  RN performing LLE dressing change.  Pt continues to have painful rectal spasms, possibly less frequent this afternoon since adding Bentyl this AM.  Has not been able to void spontaneously since Foley removed, requiring intermittent in/out catheterizations.   Disposition Plan & Communication   Status is: Inpatient  Remains inpatient appropriate because: INR remains supratherapeutic, patient requires further monitoring due to rectal bleeding  Dispo: The patient is from: Home              Anticipated d/c is to: Home              Patient currently is not medically stable to d/c.   Difficult to place patient No    Family Communication: Wife at bedside on rounds today 10/8.   Consults,  Procedures, Significant Events   Consultants:  None  Procedures:  None  Antimicrobials:  Anti-infectives (From admission, onward)    None         Micro    Objective   Vitals:   11/19/20 1057 11/19/20 2057 11/20/20 0514 11/20/20 1200  BP: 124/78 120/78 124/88 122/88  Pulse: 66 68 65 62  Resp: 18 18 20 16   Temp: 98.3 F (36.8 C) 98.7 F (37.1 C) 98.2 F (36.8 C) 98.1 F (36.7 C)  TempSrc: Axillary Oral Oral Oral  SpO2: 92% 92% 97% 94%  Weight:       Height:        Intake/Output Summary (Last 24 hours) at 11/20/2020 1525 Last data filed at 11/20/2020 1504 Gross per 24 hour  Intake 240 ml  Output 1775 ml  Net -1535 ml   Filed Weights   11/17/20 1910  Weight: 93.9 kg    Physical Exam:  General exam: awake, alert, no acute distress Respiratory system: normal respiratory effort, on room air Cardiovascular system: normal S1/S2, RRR Gastrointestinal system: Abdomen soft and nontender, +bowel sounds. Central nervous system: A&O x3.  Normal speech, grossly nonfocal exam Extremities: Distal left lower extremity wound image below       Labs   Data Reviewed: I have personally reviewed following labs and imaging studies  CBC: Recent Labs  Lab 11/15/20 2050 11/17/20 2115 11/18/20 0145 11/18/20 0808 11/18/20 1653 11/18/20 1832 11/20/20 0356  WBC 7.8 7.8 6.4 6.0 5.6 7.2  --   NEUTROABS 6.5 5.7  --   --   --   --   --   HGB 12.4* 12.6* 11.2* 11.6* 11.7* 12.1* 11.8*  HCT 39.7 40.1 35.0* 37.4* 37.5* 39.1 36.9*  MCV 89.4 89.9 90.0 92.1 90.1 90.9  --   PLT 199 198 181 174 196 193  --    Basic Metabolic Panel: Recent Labs  Lab 11/15/20 2050 11/17/20 2115 11/18/20 0149 11/19/20 0554  NA 137 133* 139 136  K 4.2 4.1 3.8 4.5  CL 100 100 107 103  CO2 28 28 25 28   GLUCOSE 111* 96 85 101*  BUN 17 17 13 17   CREATININE 1.14 1.16 1.05 1.11  CALCIUM 9.2 9.0 8.4* 8.8*   GFR: Estimated Creatinine Clearance: 55.8 mL/min (by C-G formula based on SCr of 1.11 mg/dL). Liver Function Tests: Recent Labs  Lab 11/15/20 2050 11/17/20 2115 11/18/20 0149  AST 22 29 23   ALT 16 17 15   ALKPHOS 97 109 90  BILITOT 0.8 1.2 0.9  PROT 7.4 7.3 6.2*  ALBUMIN 4.1 3.8 3.1*   Recent Labs  Lab 11/15/20 2050  LIPASE 14   No results for input(s): AMMONIA in the last 168 hours. Coagulation Profile: Recent Labs  Lab 11/17/20 2115 11/18/20 0900 11/19/20 0554 11/20/20 0356  INR 4.8* 4.7* 4.2* 4.6*   Cardiac Enzymes: No results for  input(s): CKTOTAL, CKMB, CKMBINDEX, TROPONINI in the last 168 hours. BNP (last 3 results) No results for input(s): PROBNP in the last 8760 hours. HbA1C: No results for input(s): HGBA1C in the last 72 hours. CBG: Recent Labs  Lab 11/17/20 1909  GLUCAP 83   Lipid Profile: No results for input(s): CHOL, HDL, LDLCALC, TRIG, CHOLHDL, LDLDIRECT in the last 72 hours. Thyroid Function Tests: No results for input(s): TSH, T4TOTAL, FREET4, T3FREE, THYROIDAB in the last 72 hours. Anemia Panel: No results for input(s): VITAMINB12, FOLATE, FERRITIN, TIBC, IRON, RETICCTPCT in the last 72 hours. Sepsis Labs: No results for input(s): PROCALCITON, LATICACIDVEN  in the last 168 hours.  Recent Results (from the past 240 hour(s))  Resp Panel by RT-PCR (Flu A&B, Covid) Nasopharyngeal Swab     Status: None   Collection Time: 11/18/20 12:02 AM   Specimen: Nasopharyngeal Swab; Nasopharyngeal(NP) swabs in vial transport medium  Result Value Ref Range Status   SARS Coronavirus 2 by RT PCR NEGATIVE NEGATIVE Final    Comment: (NOTE) SARS-CoV-2 target nucleic acids are NOT DETECTED.  The SARS-CoV-2 RNA is generally detectable in upper respiratory specimens during the acute phase of infection. The lowest concentration of SARS-CoV-2 viral copies this assay can detect is 138 copies/mL. A negative result does not preclude SARS-Cov-2 infection and should not be used as the sole basis for treatment or other patient management decisions. A negative result may occur with  improper specimen collection/handling, submission of specimen other than nasopharyngeal swab, presence of viral mutation(s) within the areas targeted by this assay, and inadequate number of viral copies(<138 copies/mL). A negative result must be combined with clinical observations, patient history, and epidemiological information. The expected result is Negative.  Fact Sheet for Patients:  EntrepreneurPulse.com.au  Fact  Sheet for Healthcare Providers:  IncredibleEmployment.be  This test is no t yet approved or cleared by the [place] FDA and  has been authorized for detection and/or diagnosis of SARS-CoV-2 by FDA under an Emergency Use Authorization (EUA). This EUA will remain  in effect (meaning this test can be used) for the duration of the COVID-19 declaration under Section 564(b)(1) of the Act, 21 U.S.C.section 360bbb-3(b)(1), unless the authorization is terminated  or revoked sooner.       Influenza A by PCR NEGATIVE NEGATIVE Final   Influenza B by PCR NEGATIVE NEGATIVE Final    Comment: (NOTE) The Xpert Xpress SARS-CoV-2/FLU/RSV plus assay is intended as an aid in the diagnosis of influenza from Nasopharyngeal swab specimens and should not be used as a sole basis for treatment. Nasal washings and aspirates are unacceptable for Xpert Xpress SARS-CoV-2/FLU/RSV testing.  Fact Sheet for Patients: EntrepreneurPulse.com.au  Fact Sheet for Healthcare Providers: IncredibleEmployment.be  This test is not yet approved or cleared by the [place] FDA and has been authorized for detection and/or diagnosis of SARS-CoV-2 by FDA under an Emergency Use Authorization (EUA). This EUA will remain in effect (meaning this test can be used) for the duration of the COVID-19 declaration under Section 564(b)(1) of the Act, 21 U.S.C. section 360bbb-3(b)(1), unless the authorization is terminated or revoked.  Performed at [facility], [address] [place], [place] [address]       Imaging Studies   DG Abd 1 View  Result Date: 11/19/2020 CLINICAL DATA:  Fecal impaction EXAM: ABDOMEN - 1 VIEW COMPARISON:  CT 11/15/2020 FINDINGS: Mild diffuse small and large bowel gas without convincing obstructive pattern. Moderate stool in the colon and small stool in the rectum. No radiopaque calculi IMPRESSION: Overall nonobstructed gas  pattern with moderate stool in the colon Electronically Signed   By: Donavan Foil M.D.   On: 11/19/2020 17:24     Medications   Scheduled Meds:  Chlorhexidine Gluconate Cloth  6 each Topical Daily   dicyclomine  20 mg Oral TID AC & HS   finasteride  5 mg Oral Daily   hydrocortisone-pramoxine   Rectal QID   levothyroxine  175 mcg Oral QAC breakfast   lidocaine   Topical Q6H   polyethylene glycol  17 g Oral BID   senna-docusate  1 tablet Oral BID  tamsulosin  0.4 mg Oral QPC supper   Continuous Infusions:     LOS: 2 days    Time spent: 30 minutes with > 50% spent at bedside and in coordination of care     Ezekiel Slocumb, DO [facility]  11/20/2020, 3:25 PM      If 7PM-7AM, please contact night-coverage. How to contact the [facility] Attending or Consulting provider [place] or covering provider during after hours [place], for this patient?    Check the care team in [facility] and look for a) attending/consulting [facility] provider listed and b) the [facility] team listed Log into [email or web address] and use [place]'s universal password to access. If you do not have the password, please contact the hospital operator. Locate the [facility] provider you are looking for under [facility] and page to a number that you can be directly reached. If you still have difficulty reaching the provider, please page the [facility] (Director on Call) for the Hospitalists listed on amion for assistance.

## 2020-11-20 NOTE — Progress Notes (Signed)
PROGRESS NOTE FOR [facility]  Subjective: Intermittent rectal pain.  Objective: Vital signs in last 24 hours: Temp:  [98.1 F (36.7 C)-98.7 F (37.1 C)] 98.1 F (36.7 C) (10/08 1200) Pulse Rate:  [62-68] 62 (10/08 1200) Resp:  [16-20] 16 (10/08 1200) BP: (120-124)/(78-88) 122/88 (10/08 1200) SpO2:  [92 %-97 %] 94 % (10/08 1200) Last BM Date: 11/19/20  Intake/Output from previous day: 10/07 0701 - 10/08 0700 In: 1200 [P.O.:1200] Out: 1525 [Urine:1525] Intake/Output this shift: Total I/O In: -  Out: 550 [Urine:550]  General appearance: alert and no distress GI: soft, non-tender; bowel sounds normal; no masses,  no organomegaly  Lab Results: Recent Labs    11/18/20 0808 11/18/20 1653 11/18/20 1832 11/20/20 0356  WBC 6.0 5.6 7.2  --   HGB 11.6* 11.7* 12.1* 11.8*  HCT 37.4* 37.5* 39.1 36.9*  PLT 174 196 193  --    BMET Recent Labs    11/17/20 2115 11/18/20 0149 11/19/20 0554  NA 133* 139 136  K 4.1 3.8 4.5  CL 100 107 103  CO2 28 25 28   GLUCOSE 96 85 101*  BUN 17 13 17   CREATININE 1.16 1.05 1.11  CALCIUM 9.0 8.4* 8.8*   LFT Recent Labs    11/18/20 0149  PROT 6.2*  ALBUMIN 3.1*  AST 23  ALT 15  ALKPHOS 90  BILITOT 0.9   PT/INR Recent Labs    11/19/20 0554 11/20/20 0356  LABPROT 40.5* 43.2*  INR 4.2* 4.6*   Hepatitis Panel No results for input(s): HEPBSAG, HCVAB, HEPAIGM, HEPBIGM in the last 72 hours. C-Diff No results for input(s): CDIFFTOX in the last 72 hours. Fecal Lactopherrin No results for input(s): FECLLACTOFRN in the last 72 hours.  Studies/Results: DG Abd 1 View  Result Date: 11/19/2020 CLINICAL DATA:  Fecal impaction EXAM: ABDOMEN - 1 VIEW COMPARISON:  CT 11/15/2020 FINDINGS: Mild diffuse small and large bowel gas without convincing obstructive pattern. Moderate stool in the colon and small stool in the rectum. No radiopaque calculi IMPRESSION: Overall nonobstructed gas pattern with moderate stool in the colon Electronically  Signed   By: Donavan Foil M.D.   On: 11/19/2020 17:24    Medications: Scheduled:  Chlorhexidine Gluconate Cloth  6 each Topical Daily   dicyclomine  20 mg Oral TID AC & HS   finasteride  5 mg Oral Daily   hydrocortisone-pramoxine   Rectal QID   levothyroxine  175 mcg Oral QAC breakfast   lidocaine   Topical Q6H   polyethylene glycol  17 g Oral BID   senna-docusate  1 tablet Oral BID   sorbitol, milk of mag, mineral oil, glycerin (SMOG) enema  300 mL Rectal BID   tamsulosin  0.4 mg Oral QPC supper   Continuous:  Assessment/Plan: 1) Fecal impaction. 2) Rectal pain. 3) BPH.   The rectal examination is still positive for a significant amount of retained soft stool.  It is felt that this is the source of his rectal pain complaints.  The impaction is compounded by the fact that he is on narcotics.  He had tenderness at his anus and there was no clear evidence of an anal fissure.  His family is concerned about the pain that he experiences.  A lengthy discussion was performed with the family members that the narcotics need to be discontinued and that he will experience more pain.  However, it is anticipated that his pain will resolve once he is able to pass the stool.  He will worsen before  improving.  A repeat disimpaction can be performed, but it is unclear if he will be able to tolerate the maneuver at this time.  Plan: 1) SMOG enema BID. 2) Continue with BID Miralax.  LOS: 2 days   Jessice Madill D 11/20/2020, 5:25 PM

## 2020-11-20 NOTE — Progress Notes (Signed)
Subjective: Clarence Hopkins continues to have episodic anal pain that wasn't impacted by foley removal.  He has been unable to void which is not unexpected but is tolerating the CIC.     ROS:  Review of Systems  Constitutional:  Negative for chills and fever.   Anti-infectives: Anti-infectives (From admission, onward)    None       Current Facility-Administered Medications  Medication Dose Route Frequency Provider Last Rate Last Admin   acetaminophen (TYLENOL) tablet 650 mg  650 mg Oral Q6H PRN Nicole Kindred A, DO   650 mg at 11/18/20 1038   bisacodyl (DULCOLAX) EC tablet 5 mg  5 mg Oral Daily PRN Nicole Kindred A, DO       Chlorhexidine Gluconate Cloth 2 % PADS 6 each  6 each Topical Daily Nicole Kindred A, DO   6 each at 11/20/20 3419   finasteride (PROSCAR) tablet 5 mg  5 mg Oral Daily Nicole Kindred A, DO   5 mg at 11/20/20 6222   hydrocortisone-pramoxine (ANALPRAM-HC) 2.5-1 % rectal cream   Rectal QID Alfredia Ferguson, PA-C   Given at 11/20/20 9798   levothyroxine (SYNTHROID) tablet 175 mcg  175 mcg Oral QAC breakfast Nicole Kindred A, DO   175 mcg at 11/20/20 0533   lidocaine (XYLOCAINE) 5 % ointment   Topical Q6H Esterwood, Amy S, PA-C   Given at 11/20/20 0555   metoprolol tartrate (LOPRESSOR) injection 5 mg  5 mg Intravenous Q6H PRN Elwyn Reach, MD       morphine 2 MG/ML injection 2 mg  2 mg Intravenous Q2H PRN Elwyn Reach, MD   2 mg at 11/20/20 0533   ondansetron (ZOFRAN) tablet 4 mg  4 mg Oral Q6H PRN Elwyn Reach, MD       Or   ondansetron (ZOFRAN) injection 4 mg  4 mg Intravenous Q6H PRN Elwyn Reach, MD       oxyCODONE (Oxy IR/ROXICODONE) immediate release tablet 5 mg  5 mg Oral Q6H PRN Nicole Kindred A, DO   5 mg at 11/20/20 9211   phenazopyridine (PYRIDIUM) tablet 200 mg  200 mg Oral TID WC Nicole Kindred A, DO   200 mg at 11/19/20 1800   phytonadione (VITAMIN K) tablet 5 mg  5 mg Oral Once Nicole Kindred A, DO       polyethylene glycol  (MIRALAX / GLYCOLAX) packet 17 g  17 g Oral BID Esterwood, Amy S, PA-C   17 g at 11/20/20 9417   polyvinyl alcohol (LIQUIFILM TEARS) 1.4 % ophthalmic solution 1 drop  1 drop Both Eyes PRN Nicole Kindred A, DO       senna-docusate (Senokot-S) tablet 1 tablet  1 tablet Oral BID Nicole Kindred A, DO       tamsulosin (FLOMAX) capsule 0.4 mg  0.4 mg Oral QPC supper Nicole Kindred A, DO   0.4 mg at 11/19/20 1803   Vibegron TABS 1 each  1 each Oral Daily Nicole Kindred A, DO   1 each at 11/19/20 1036     Objective: Vital signs in last 24 hours: Temp:  [98.2 F (36.8 C)-98.7 F (37.1 C)] 98.2 F (36.8 C) (10/08 0514) Pulse Rate:  [65-68] 65 (10/08 0514) Resp:  [18-20] 20 (10/08 0514) BP: (120-124)/(78-88) 124/88 (10/08 0514) SpO2:  [92 %-97 %] 97 % (10/08 0514)  Intake/Output from previous day: 10/07 0701 - 10/08 0700 In: 1200 [P.O.:1200] Out: 1525 [Urine:1525] Intake/Output this shift: No intake/output data recorded.  Physical Exam  Lab Results:  Recent Labs    11/18/20 1653 11/18/20 1832 11/20/20 0356  WBC 5.6 7.2  --   HGB 11.7* 12.1* 11.8*  HCT 37.5* 39.1 36.9*  PLT 196 193  --    BMET Recent Labs    11/18/20 0149 11/19/20 0554  NA 139 136  K 3.8 4.5  CL 107 103  CO2 25 28  GLUCOSE 85 101*  BUN 13 17  CREATININE 1.05 1.11  CALCIUM 8.4* 8.8*   PT/INR Recent Labs    11/19/20 0554 11/20/20 0356  LABPROT 40.5* 43.2*  INR 4.2* 4.6*   ABG No results for input(s): PHART, HCO3 in the last 72 hours.  Invalid input(s): PCO2, PO2  Studies/Results: DG Abd 1 View  Result Date: 11/19/2020 CLINICAL DATA:  Fecal impaction EXAM: ABDOMEN - 1 VIEW COMPARISON:  CT 11/15/2020 FINDINGS: Mild diffuse small and large bowel gas without convincing obstructive pattern. Moderate stool in the colon and small stool in the rectum. No radiopaque calculi IMPRESSION: Overall nonobstructed gas pattern with moderate stool in the colon Electronically Signed   By: Donavan Foil M.D.    On: 11/19/2020 17:24     Assessment and Plan: BPH with urinary retention.   He has tolerated CIC and removal of the foley has not impacted his pain complaints which are primarily anal at this time.   I will stop the Gemtesa but continue the finasteride and tamsulosin at this time.  Pelvic pain.  This is now more isolated in the anal area and continues to be episodic.        LOS: 2 days    Irine Seal 11/20/2020 [phone] Patient ID: Clarence Hopkins, male   DOB: 31-Jan-1931, 87 y.o.   MRN: [id number]

## 2020-11-20 NOTE — Evaluation (Signed)
Physical Therapy Evaluation Patient Details Name: Clarence Hopkins MRN: [id number] DOB: 1930/09/02 Today's Date: 11/20/2020  History of Present Illness  Clarence Hopkins is a 87 y.o. male who presented to the ER with rectal bleed. Ot note, pt recently in ED for abdominal pain and constipation, was disimpacted. 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 Pt and spouse recently moved into [facility], pt normally holding arm in arm with spouse to steady self with gait, does have SPC at home, independent with self care tasks. Pt currently requiring min A to power to stand from low seated toilet and min A to steady with limited ambulation to/from restroom using RW. Encouraged pt to get OOB with all meals despite hunger, spending time OOB and ambulating with nursing to restroom and beyond as able. Pt currently with functional limitations due to the deficits listed below (see PT Problem List). Pt will benefit from skilled PT to increase their independence and safety with mobility to allow discharge to the venue listed below.          Recommendations for follow up therapy are one component of a multi-disciplinary discharge planning process, led by the attending physician.  Recommendations may be updated based on patient status, additional functional criteria and insurance authorization.  Follow Up Recommendations Home health PT;Supervision for mobility/OOB    Equipment Recommendations  Rolling walker with 5" wheels    Recommendations for Other Services       Precautions / Restrictions Precautions Precautions: Fall Restrictions Weight Bearing Restrictions: No      Mobility  Bed Mobility Overal bed mobility: Needs Assistance Bed Mobility: Supine to Sit;Sit to Supine  Supine to sit: Supervision Sit to supine:  Supervision   General bed mobility comments: pt using bedrail to upright trunk into sitting from flat bed, supv for safety    Transfers Overall transfer level: Needs assistance Equipment used: Rolling walker (2 wheeled) Transfers: Sit to/from Stand Sit to Stand: Min guard;Min assist  General transfer comment: min guard from elevated bed with RW and min A from low seated toilet with handrail to assist, VCs for glute/quad activation  Ambulation/Gait Ambulation/Gait assistance: Min assist Gait Distance (Feet): 12 Feet (x2) Assistive device: Rolling walker (2 wheeled) Gait Pattern/deviations: Step-through pattern;Decreased stride length Gait velocity: decreased   General Gait Details: slow, step through pattern, generally unsteady without LOB, VCs for positioning within RW frame  Stairs            Wheelchair Mobility    Modified Rankin (Stroke Patients Only)       Balance Overall balance assessment: Needs assistance Sitting-balance support: Feet supported Sitting balance-Leahy Scale: Good  Standing balance support: During functional activity;Bilateral upper extremity supported Standing balance-Leahy Scale: Poor Standing balance comment: reliant on UE support       Pertinent Vitals/Pain Pain Assessment: Faces Faces Pain Scale: Hurts whole lot Pain Location: with anal spasms Pain Descriptors / Indicators: Spasm Pain Intervention(s): Limited activity within patient's tolerance;Monitored during session;Premedicated before session;Repositioned    Home Living Family/patient expects to be discharged to:: Assisted living ([facility]) Living Arrangements: Spouse/significant other Available Help at Discharge: Family;Available PRN/intermittently Type of Home: House ([facility]arden home) Home Access: Level entry     Home Layout: One level Home Equipment: Cane - single point      Prior Function Level of Independence: Independent  Comments: Pt  and spouse report they  recently moved to [place], pt ambualtes arm in arm with spouse, independent with bathing, toileting and dressing. Facility provides meals and household cleaning.     Hand Dominance        Extremity/Trunk Assessment   Upper Extremity Assessment Upper Extremity Assessment: Overall WFL for tasks assessed    Lower Extremity Assessment Lower Extremity Assessment: Overall WFL for tasks assessed (AROM WNL, strength 4/5, denies numbness/tingling, symmetrical)    Cervical / Trunk Assessment Cervical / Trunk Assessment: Normal  Communication   Communication: No difficulties  Cognition Arousal/Alertness: Awake/alert Behavior During Therapy: WFL for tasks assessed/performed Overall Cognitive Status: Within Functional Limits for tasks assessed               General Comments      Exercises     Assessment/Plan    PT Assessment Patient needs continued PT services  PT Problem List Decreased strength;Decreased activity tolerance;Decreased balance;Decreased knowledge of use of DME;Decreased safety awareness;Pain       PT Treatment Interventions DME instruction;Gait training;Functional mobility training;Therapeutic activities;Therapeutic exercise;Balance training;Patient/family education    PT Goals (Current goals can be found in the Care Plan section)  Acute Rehab PT Goals Patient Stated Goal: less pain PT Goal Formulation: With patient/family Time For Goal Achievement: 12/04/20 Potential to Achieve Goals: Good    Frequency Min 3X/week   Barriers to discharge        Co-evaluation               AM-PAC PT "6 Clicks" Mobility  Outcome Measure Help needed turning from your back to your side while in a flat bed without using bedrails?: None Help needed moving from lying on your back to sitting on the side of a flat bed without using bedrails?: None Help needed moving to and from a bed to a chair (including a wheelchair)?: A Little Help needed standing up from a  chair using your arms (e.g., wheelchair or bedside chair)?: A Little Help needed to walk in hospital room?: A Little Help needed climbing 3-5 steps with a railing? : A Little 6 Click Score: 20    End of Session Equipment Utilized During Treatment: Gait belt Activity Tolerance: Patient tolerated treatment well Patient left: in bed;with call bell/phone within reach;with bed alarm set;with nursing/sitter in room Nurse Communication: Mobility status PT Visit Diagnosis: Unsteadiness on feet (R26.81);Other abnormalities of gait and mobility (R26.89)    Time: 6415-8309 PT Time Calculation (min) (ACUTE ONLY): 36 min   Charges:   PT Evaluation $PT Eval Low Complexity: 1 Low PT Treatments $Therapeutic Activity: 8-22 mins         Tori Nahshon Reich PT, DPT 11/20/20, 12:36 PM

## 2020-11-21 ENCOUNTER — Encounter (HOSPITAL_COMMUNITY): Payer: Self-pay | Admitting: Internal Medicine

## 2020-11-21 ENCOUNTER — Inpatient Hospital Stay (HOSPITAL_COMMUNITY): Payer: Medicare Other

## 2020-11-21 DIAGNOSIS — K625 Hemorrhage of anus and rectum: Secondary | ICD-10-CM | POA: Diagnosis not present

## 2020-11-21 LAB — PROTIME-INR
INR: 2.1 — ABNORMAL HIGH (ref 0.8–1.2)
Prothrombin Time: 23.4 seconds — ABNORMAL HIGH (ref 11.4–15.2)

## 2020-11-21 LAB — HEMOGLOBIN AND HEMATOCRIT, BLOOD
HCT: 36 % — ABNORMAL LOW (ref 39.0–52.0)
Hemoglobin: 11.3 g/dL — ABNORMAL LOW (ref 13.0–17.0)

## 2020-11-21 MED ORDER — DILTIAZEM GEL 2 %
Freq: Two times a day (BID) | CUTANEOUS | Status: DC
  Filled 2020-11-21: qty 30

## 2020-11-21 MED ORDER — IOHEXOL 350 MG/ML SOLN
80.0000 mL | Freq: Once | INTRAVENOUS | Status: AC | PRN
  Administered 2020-11-21: 80 mL via INTRAVENOUS

## 2020-11-21 MED ORDER — WARFARIN SODIUM 5 MG PO TABS
7.5000 mg | ORAL_TABLET | Freq: Once | ORAL | Status: AC
  Administered 2020-11-21: 7.5 mg via ORAL
  Filled 2020-11-21: qty 1

## 2020-11-21 MED ORDER — IOHEXOL 9 MG/ML PO SOLN
500.0000 mL | ORAL | Status: AC
  Administered 2020-11-21 (×2): 500 mL via ORAL

## 2020-11-21 MED ORDER — BISACODYL 5 MG PO TBEC
5.0000 mg | DELAYED_RELEASE_TABLET | Freq: Every day | ORAL | Status: DC
  Administered 2020-11-21 – 2020-11-23 (×3): 5 mg via ORAL
  Filled 2020-11-21 (×4): qty 1

## 2020-11-21 MED ORDER — IOHEXOL 9 MG/ML PO SOLN
ORAL | Status: AC
  Administered 2020-11-21: 500 mL
  Filled 2020-11-21: qty 1000

## 2020-11-21 MED ORDER — WARFARIN - PHARMACIST DOSING INPATIENT: Freq: Every day | Status: DC

## 2020-11-21 MED ORDER — ENSURE ENLIVE PO LIQD
237.0000 mL | Freq: Three times a day (TID) | ORAL | Status: DC
  Administered 2020-11-21 – 2020-11-23 (×6): 237 mL via ORAL

## 2020-11-21 NOTE — Progress Notes (Signed)
Patient wants to walk and use the bathroom frequently. The patient   had a SMOG enema this evening with 2 x BM s post enema.  The patient was bladder scanned and initially, around 10:45 pm,   the urine yield was 272 ml. Nearly 3 hours later the patient stated   that he felt "full". The bladder scan yielded 432ml.   An In and Out urethral catheterization was performed and yielded   474 ml of orange urine.

## 2020-11-21 NOTE — Progress Notes (Signed)
ANTICOAGULATION CONSULT NOTE - Initial Consult  Pharmacy Consult for Warfarin Indication: h/o PE and protein C deficiency  No Known Allergies  Patient Measurements: Height: 6' 3.5" (191.8 cm) Weight: 93.9 kg (207 lb) IBW/kg (Calculated) : 85.65  Vital Signs: Temp: 98 F (36.7 C) (10/09 0556) Temp Source: Oral (10/09 0556) BP: 152/78 (10/09 0556) Pulse Rate: 65 (10/09 0556)  Labs: Recent Labs    11/18/20 1653 11/18/20 1832 11/19/20 0554 11/20/20 0356 11/21/20 0352  HGB 11.7* 12.1*  --  11.8* 11.3*  HCT 37.5* 39.1  --  36.9* 36.0*  PLT 196 193  --   --   --   LABPROT  --   --  40.5* 43.2* 23.4*  INR  --   --  4.2* 4.6* 2.1*  CREATININE  --   --  1.11  --   --     Estimated Creatinine Clearance: 55.8 mL/min (by C-G formula based on SCr of 1.11 mg/dL).   Medical History: Past Medical History:  Diagnosis Date   Arthritis    Cancer ([place]) 2003   melanoma  L ear   GERD (gastroesophageal reflux disease)    Hypothyroidism    post op   Lung mass    Pulmonary embolism ([place]) 2005 & 2007   protein C deficiency    Medications:  Warfarin 5 mg daily except 7.5 mg MWF PTA last dose- Med Mgmt note from 10/5 instructed pt/caregiver to reduce dose of warfarin due to recent Rx for Levaquin- 10/5 was last dose should have been 5 mg per office instructions but unclear if took 5 mg or 7.5 mg   Assessment: 87 y/o M on chronic warfarin for h/o PE and protein C deficiency admitted with rectal bleeding and supra-therapeutic INR. Given 2.5 mg vitamin K po 10/5 and repeated 5 mg po 10/8 with INR now finally in therapeutic range. No significant DDI with current regimen.   Goal of Therapy:  INR 2-3   Plan:  Warfarin 7.5 mg once today PT/INR daily  Ulice Dash D 11/21/2020,9:46 AM

## 2020-11-21 NOTE — Progress Notes (Signed)
Subjective: Clarence Hopkins continues to have episodic anal pain that wasn't impacted by foley removal.  He has been unable to void which is not unexpected but is tolerating the CIC.     ROS:  Review of Systems  Constitutional:  Negative for chills and fever.   Anti-infectives: Anti-infectives (From admission, onward)    None       Current Facility-Administered Medications  Medication Dose Route Frequency Provider Last Rate Last Admin   acetaminophen (TYLENOL) tablet 650 mg  650 mg Oral Q6H PRN Nicole Kindred A, DO   650 mg at 11/21/20 2706   belladonna-opium (B&O) suppository 16.2-30 mg  1 suppository Rectal Q6H PRN Irine Seal, MD       bisacodyl (DULCOLAX) EC tablet 5 mg  5 mg Oral Daily Nicole Kindred A, DO       Chlorhexidine Gluconate Cloth 2 % PADS 6 each  6 each Topical Daily Nicole Kindred A, DO   6 each at 11/20/20 2376   dicyclomine (BENTYL) tablet 20 mg  20 mg Oral TID AC & HS Nicole Kindred A, DO   20 mg at 11/21/20 0808   [START ON 11/22/2020] diltiazem 2 % gel   Topical BID Nicole Kindred A, DO       feeding supplement (ENSURE ENLIVE / ENSURE PLUS) liquid 237 mL  237 mL Oral TID BM Nicole Kindred A, DO       finasteride (PROSCAR) tablet 5 mg  5 mg Oral Daily Nicole Kindred A, DO   5 mg at 11/21/20 0809   hydrocortisone-pramoxine (ANALPRAM-HC) 2.5-1 % rectal cream   Rectal QID Esterwood, Amy S, PA-C   Given at 11/21/20 2831   iohexol (OMNIPAQUE) 9 MG/ML oral solution 500 mL  500 mL Oral Q1H Nicole Kindred A, DO       levothyroxine (SYNTHROID) tablet 175 mcg  175 mcg Oral QAC breakfast Nicole Kindred A, DO   175 mcg at 11/21/20 0613   lidocaine (XYLOCAINE) 5 % ointment   Topical Q6H Esterwood, Amy S, PA-C   Given at 11/21/20 5176   metoprolol tartrate (LOPRESSOR) injection 5 mg  5 mg Intravenous Q6H PRN Elwyn Reach, MD       ondansetron (ZOFRAN) tablet 4 mg  4 mg Oral Q6H PRN Elwyn Reach, MD       Or   ondansetron (ZOFRAN) injection 4 mg  4 mg  Intravenous Q6H PRN Elwyn Reach, MD       polyethylene glycol (MIRALAX / GLYCOLAX) packet 17 g  17 g Oral BID Esterwood, Amy S, PA-C   17 g at 11/21/20 0809   polyvinyl alcohol (LIQUIFILM TEARS) 1.4 % ophthalmic solution 1 drop  1 drop Both Eyes PRN Nicole Kindred A, DO       senna-docusate (Senokot-S) tablet 1 tablet  1 tablet Oral BID Nicole Kindred A, DO   1 tablet at 11/21/20 0809   sorbitol, milk of mag, mineral oil, glycerin (SMOG) enema  300 mL Rectal BID Carol Ada, MD   300 mL at 11/21/20 0809   tamsulosin (FLOMAX) capsule 0.4 mg  0.4 mg Oral QPC supper Nicole Kindred A, DO   0.4 mg at 11/20/20 1804   warfarin (COUMADIN) tablet 7.5 mg  7.5 mg Oral ONCE-1600 Ezekiel Slocumb, DO       Warfarin - Pharmacist Dosing Inpatient   Does not apply q1600 Nicole Kindred A, DO         Objective: Vital signs in last 24 hours:  Temp:  [98 F (36.7 C)-98.2 F (36.8 C)] 98 F (36.7 C) (10/09 0556) Pulse Rate:  [62-68] 65 (10/09 0556) Resp:  [16-20] 18 (10/09 0556) BP: (99-152)/(62-88) 152/78 (10/09 0556) SpO2:  [93 %-94 %] 94 % (10/09 0556)  Intake/Output from previous day: 10/08 0701 - 10/09 0700 In: 330 [P.O.:330] Out: 1025 [Urine:1025] Intake/Output this shift: No intake/output data recorded.   Physical Exam  Lab Results:  Recent Labs    11/18/20 1653 11/18/20 1832 11/20/20 0356 11/21/20 0352  WBC 5.6 7.2  --   --   HGB 11.7* 12.1* 11.8* 11.3*  HCT 37.5* 39.1 36.9* 36.0*  PLT 196 193  --   --     BMET Recent Labs    11/19/20 0554  NA 136  K 4.5  CL 103  CO2 28  GLUCOSE 101*  BUN 17  CREATININE 1.11  CALCIUM 8.8*    PT/INR Recent Labs    11/20/20 0356 11/21/20 0352  LABPROT 43.2* 23.4*  INR 4.6* 2.1*    ABG No results for input(s): PHART, HCO3 in the last 72 hours.  Invalid input(s): PCO2, PO2  Studies/Results: DG Abd 1 View  Result Date: 11/19/2020 CLINICAL DATA:  Fecal impaction EXAM: ABDOMEN - 1 VIEW COMPARISON:  CT 11/15/2020  FINDINGS: Mild diffuse small and large bowel gas without convincing obstructive pattern. Moderate stool in the colon and small stool in the rectum. No radiopaque calculi IMPRESSION: Overall nonobstructed gas pattern with moderate stool in the colon Electronically Signed   By: Donavan Foil M.D.   On: 11/19/2020 17:24     Assessment and Plan: BPH with urinary retention.   He has tolerated CIC and removal of the foley has not impacted his pain complaints which are primarily anal at this time.   I will stop the Gemtesa but continue the finasteride and tamsulosin at this time.  Pelvic pain.  This is now more isolated in the anal area and continues to be episodic.        LOS: 3 days    Irine Seal 11/21/2020 [phone] Patient ID: Clarence Hopkins, male   DOB: [birth year], 87 y.o.   MRN: [id number] Patient ID: Clarence Hopkins, male   DOB: [birth year], 87 y.o.   MRN: [id number]

## 2020-11-21 NOTE — Progress Notes (Signed)
Subjective: He is not certain that he feels better.  Objective: Vital signs in last 24 hours: Temp:  [98 F (36.7 C)-98.2 F (36.8 C)] 98 F (36.7 C) (10/09 0556) Pulse Rate:  [62-68] 65 (10/09 0556) Resp:  [16-20] 18 (10/09 0556) BP: (99-152)/(62-88) 152/78 (10/09 0556) SpO2:  [93 %-94 %] 94 % (10/09 0556) Last BM Date: 11/19/20  Intake/Output from previous day: 10/08 0701 - 10/09 0700 In: 330 [P.O.:330] Out: 1025 [Urine:1025] Intake/Output this shift: Total I/O In: 330 [P.O.:330] Out: 475 [Urine:475]  General appearance: alert and no distress GI: soft, non-tender; bowel sounds normal; no masses,  no organomegaly  Lab Results: Recent Labs    11/18/20 0808 11/18/20 1653 11/18/20 1832 11/20/20 0356 11/21/20 0352  WBC 6.0 5.6 7.2  --   --   HGB 11.6* 11.7* 12.1* 11.8* 11.3*  HCT 37.4* 37.5* 39.1 36.9* 36.0*  PLT 174 196 193  --   --    BMET Recent Labs    11/19/20 0554  NA 136  K 4.5  CL 103  CO2 28  GLUCOSE 101*  BUN 17  CREATININE 1.11  CALCIUM 8.8*   LFT No results for input(s): PROT, ALBUMIN, AST, ALT, ALKPHOS, BILITOT, BILIDIR, IBILI in the last 72 hours. PT/INR Recent Labs    11/20/20 0356 11/21/20 0352  LABPROT 43.2* 23.4*  INR 4.6* 2.1*   Hepatitis Panel No results for input(s): HEPBSAG, HCVAB, HEPAIGM, HEPBIGM in the last 72 hours. C-Diff No results for input(s): CDIFFTOX in the last 72 hours. Fecal Lactopherrin No results for input(s): FECLLACTOFRN in the last 72 hours.  Studies/Results: DG Abd 1 View  Result Date: 11/19/2020 CLINICAL DATA:  Fecal impaction EXAM: ABDOMEN - 1 VIEW COMPARISON:  CT 11/15/2020 FINDINGS: Mild diffuse small and large bowel gas without convincing obstructive pattern. Moderate stool in the colon and small stool in the rectum. No radiopaque calculi IMPRESSION: Overall nonobstructed gas pattern with moderate stool in the colon Electronically Signed   By: Donavan Foil M.D.   On: 11/19/2020 17:24    Medications:  Scheduled:  Chlorhexidine Gluconate Cloth  6 each Topical Daily   dicyclomine  20 mg Oral TID AC & HS   finasteride  5 mg Oral Daily   hydrocortisone-pramoxine   Rectal QID   levothyroxine  175 mcg Oral QAC breakfast   lidocaine   Topical Q6H   polyethylene glycol  17 g Oral BID   senna-docusate  1 tablet Oral BID   sorbitol, milk of mag, mineral oil, glycerin (SMOG) enema  300 mL Rectal BID   tamsulosin  0.4 mg Oral QPC supper   Continuous:  Assessment/Plan: 1) Fecal impaction. 2) Proctalgia.   Nursing documents that he had two bowel movements after the SMOG enema.  He is equivocal about any improvement with his symptoms.  No new recommendations.  If he does not progress further in the next couple of days he will need to be reassessed for another disimpaction.  Plan: 1) Continue with SMOG enemas. 2) Continue with BID Miralax. 3) Avoid narcotic medications.  LOS: 3 days   Macarena Langseth D 11/21/2020, 6:54 AM

## 2020-11-21 NOTE — Progress Notes (Signed)
PROGRESS NOTE    Clarence Hopkins   NFA:[id number]  DOB: 12/01/1930  PCP: Cassandria Anger, MD    DOA: 11/17/2020 LOS: 3    Brief Narrative / Hospital Course to Date:   Pt admitted with rectal bleeding in the setting of supratherapeutic INR and recent fecal manual disimpaction.  Also with severely painful rectal vs bladder spasms that have been difficult to get controlled.  GI and Urology are consulted.  Assessment & Plan   Principal Problem:   Rectal bleed Active Problems:   Protein C deficiency (HCC)   PULMONARY EMBOLISM, HX OF   Long term (current) use of anticoagulants   Hypertriglyceridemia   Rectal bleeding   Pressure injury of skin   Rectal bleeding /hematochezia-with supratherapeutic INR and recent fecal disimpaction, suspect due to minor trauma. Given oral vitamin K in the ED on admission. No bleeding reported since admission.   Hemoglobin stable.  --Hold warfarin --Monitor hemoglobin and transfuse if less than 7  Supratherapeutic INR -presented with INR 4.8 in setting of rectal bleeding.   10/8: INR up to 4.6, given 5 mg PO vitamin K 10/9: INR down to 2.1 --Daily INR --Resume warfarin per pharmacy dosing --Monitor closely for bleeding  Bladder versus rectal spasms -B&O suppository started by urology, Bentyl trial, PR .  Persistent despite  --Urology and GI consulted, see their recommendations --Surgery consulted and recommend topical diltiazem, topical lidocaine.   Suspect due to fecal impaction vs fissure. --Avoid narcotics  --Repeat CT pelvis with contrast today to reassess  Urinary retention  -patient follows with urologist, Dr. Sandrea Matte. --Continue pain control per orders --Resume home Gemtesa (patient's own med) --Consider urology consult if bladder spasm pain not improving --Continue in/out cath's as needed --Outpatient urology follow-up  Left lower extremity wound s/p recent excision of skin cancer - WOC consulted.  See their wound care  instructions.  History of PE History of protein C deficiency Chronically anticoagulated with warfarin. -- Warfarin on hold due to bleeding and supratherapeutic INR  Hypertriglyceridemia -not on home medication  Hypothyroidism -continue levothyroxine  BPH -continue tamsulosin and finasteride  Hx of metastatic thyroid cancer, skin cancer - follows with oncology, dermatology  Patient BMI: Body mass index is 25.53 kg/m.   DVT prophylaxis: SCDs Start: 11/18/20 0113 warfarin (COUMADIN) tablet 7.5 mg   Diet:  Diet Orders (From admission, onward)     Start     Ordered   11/21/20 0842  DIET SOFT Room service appropriate? Yes; Fluid consistency: Thin  Diet effective now       Question Answer Comment  Room service appropriate? Yes   Fluid consistency: Thin      11/21/20 0841              Code Status: Full Code   Subjective 11/21/20    Patient still having frequent bouts of rectal / anal pain or spasms every few minutes that are debilitating.  So far no improvement with measures taken thus far.  Patient and family understandably frustrated that he continues to suffer.  Per bedside RN, patient did not tolerate enema so it was not fully administered this AM.   Disposition Plan & Communication   Status is: Inpatient  Remains inpatient appropriate because: INR remains supratherapeutic, patient requires further monitoring due to rectal bleeding  Dispo: The patient is from: Home              Anticipated d/c is to: Home  Patient currently is not medically stable to d/c.   Difficult to place patient No    Family Communication: daughter at bedside on rounds today 10/9.   Consults, Procedures, Significant Events   Consultants:  None  Procedures:  None  Antimicrobials:  Anti-infectives (From admission, onward)    None         Micro    Objective   Vitals:   11/20/20 0514 11/20/20 1200 11/20/20 2000 11/21/20 0556  BP: 124/88 122/88 99/62 (!)  152/78  Pulse: 65 62 68 65  Resp: 20 16 20 18   Temp: 98.2 F (36.8 C) 98.1 F (36.7 C) 98.2 F (36.8 C) 98 F (36.7 C)  TempSrc: Oral Oral Oral Oral  SpO2: 97% 94% 93% 94%  Weight:      Height:        Intake/Output Summary (Last 24 hours) at 11/21/2020 1511 Last data filed at 11/21/2020 9323 Gross per 24 hour  Intake 330 ml  Output 475 ml  Net -145 ml   Filed Weights   11/17/20 1910  Weight: 93.9 kg    Physical Exam:  General exam: awake, alert, intermittent distress due to pain every few minutes Respiratory system: lungs b/l, normal respiratory effort, on room air Cardiovascular system: normal S1/S2, RRR Gastrointestinal system: Abdomen soft and nontender, +bowel sounds. Central nervous system: A&O x3.  Normal speech, grossly nonfocal exam Extremities: Distal left lower extremity wound image from 10/8 below       Labs   Data Reviewed: I have personally reviewed following labs and imaging studies  CBC: Recent Labs  Lab 11/15/20 2050 11/17/20 2115 11/18/20 0145 11/18/20 0808 11/18/20 1653 11/18/20 1832 11/20/20 0356 11/21/20 0352  WBC 7.8 7.8 6.4 6.0 5.6 7.2  --   --   NEUTROABS 6.5 5.7  --   --   --   --   --   --   HGB 12.4* 12.6* 11.2* 11.6* 11.7* 12.1* 11.8* 11.3*  HCT 39.7 40.1 35.0* 37.4* 37.5* 39.1 36.9* 36.0*  MCV 89.4 89.9 90.0 92.1 90.1 90.9  --   --   PLT 199 198 181 174 196 193  --   --    Basic Metabolic Panel: Recent Labs  Lab 11/15/20 2050 11/17/20 2115 11/18/20 0149 11/19/20 0554  NA 137 133* 139 136  K 4.2 4.1 3.8 4.5  CL 100 100 107 103  CO2 28 28 25 28   GLUCOSE 111* 96 85 101*  BUN 17 17 13 17   CREATININE 1.14 1.16 1.05 1.11  CALCIUM 9.2 9.0 8.4* 8.8*   GFR: Estimated Creatinine Clearance: 55.8 mL/min (by C-G formula based on SCr of 1.11 mg/dL). Liver Function Tests: Recent Labs  Lab 11/15/20 2050 11/17/20 2115 11/18/20 0149  AST 22 29 23   ALT 16 17 15   ALKPHOS 97 109 90  BILITOT 0.8 1.2 0.9  PROT 7.4 7.3 6.2*   ALBUMIN 4.1 3.8 3.1*   Recent Labs  Lab 11/15/20 2050  LIPASE 14   No results for input(s): AMMONIA in the last 168 hours. Coagulation Profile: Recent Labs  Lab 11/17/20 2115 11/18/20 0900 11/19/20 0554 11/20/20 0356 11/21/20 0352  INR 4.8* 4.7* 4.2* 4.6* 2.1*   Cardiac Enzymes: No results for input(s): CKTOTAL, CKMB, CKMBINDEX, TROPONINI in the last 168 hours. BNP (last 3 results) No results for input(s): PROBNP in the last 8760 hours. HbA1C: No results for input(s): HGBA1C in the last 72 hours. CBG: Recent Labs  Lab 11/17/20 1909  GLUCAP 83  Lipid Profile: No results for input(s): CHOL, HDL, LDLCALC, TRIG, CHOLHDL, LDLDIRECT in the last 72 hours. Thyroid Function Tests: No results for input(s): TSH, T4TOTAL, FREET4, T3FREE, THYROIDAB in the last 72 hours. Anemia Panel: No results for input(s): VITAMINB12, FOLATE, FERRITIN, TIBC, IRON, RETICCTPCT in the last 72 hours. Sepsis Labs: No results for input(s): PROCALCITON, LATICACIDVEN in the last 168 hours.  Recent Results (from the past 240 hour(s))  Resp Panel by RT-PCR (Flu A&B, Covid) Nasopharyngeal Swab     Status: None   Collection Time: 11/18/20 12:02 AM   Specimen: Nasopharyngeal Swab; Nasopharyngeal(NP) swabs in vial transport medium  Result Value Ref Range Status   SARS Coronavirus 2 by RT PCR NEGATIVE NEGATIVE Final    Comment: (NOTE) SARS-CoV-2 target nucleic acids are NOT DETECTED.  The SARS-CoV-2 RNA is generally detectable in upper respiratory specimens during the acute phase of infection. The lowest concentration of SARS-CoV-2 viral copies this assay can detect is 138 copies/mL. A negative result does not preclude SARS-Cov-2 infection and should not be used as the sole basis for treatment or other patient management decisions. A negative result may occur with  improper specimen collection/handling, submission of specimen other than nasopharyngeal swab, presence of viral mutation(s) within  the areas targeted by this assay, and inadequate number of viral copies(<138 copies/mL). A negative result must be combined with clinical observations, patient history, and epidemiological information. The expected result is Negative.  Fact Sheet for Patients:  EntrepreneurPulse.com.au  Fact Sheet for Healthcare Providers:  IncredibleEmployment.be  This test is no t yet approved or cleared by the [place] FDA and  has been authorized for detection and/or diagnosis of SARS-CoV-2 by FDA under an Emergency Use Authorization (EUA). This EUA will remain  in effect (meaning this test can be used) for the duration of the COVID-19 declaration under Section 564(b)(1) of the Act, 21 U.S.C.section 360bbb-3(b)(1), unless the authorization is terminated  or revoked sooner.       Influenza A by PCR NEGATIVE NEGATIVE Final   Influenza B by PCR NEGATIVE NEGATIVE Final    Comment: (NOTE) The Xpert Xpress SARS-CoV-2/FLU/RSV plus assay is intended as an aid in the diagnosis of influenza from Nasopharyngeal swab specimens and should not be used as a sole basis for treatment. Nasal washings and aspirates are unacceptable for Xpert Xpress SARS-CoV-2/FLU/RSV testing.  Fact Sheet for Patients: EntrepreneurPulse.com.au  Fact Sheet for Healthcare Providers: IncredibleEmployment.be  This test is not yet approved or cleared by the [place] FDA and has been authorized for detection and/or diagnosis of SARS-CoV-2 by FDA under an Emergency Use Authorization (EUA). This EUA will remain in effect (meaning this test can be used) for the duration of the COVID-19 declaration under Section 564(b)(1) of the Act, 21 U.S.C. section 360bbb-3(b)(1), unless the authorization is terminated or revoked.  Performed at [facility], [place] [address], [place], [place] [address]       Imaging Studies   DG Abd 1  View  Result Date: 11/19/2020 CLINICAL DATA:  Fecal impaction EXAM: ABDOMEN - 1 VIEW COMPARISON:  CT 11/15/2020 FINDINGS: Mild diffuse small and large bowel gas without convincing obstructive pattern. Moderate stool in the colon and small stool in the rectum. No radiopaque calculi IMPRESSION: Overall nonobstructed gas pattern with moderate stool in the colon Electronically Signed   By: Donavan Foil M.D.   On: 11/19/2020 17:24   CT PELVIS W CONTRAST  Result Date: 11/21/2020 CLINICAL DATA:  Severe persistent rectal pain. EXAM: CT PELVIS  WITH CONTRAST TECHNIQUE: Multidetector CT imaging of the pelvis was performed using the standard protocol following the bolus administration of intravenous contrast. CONTRAST:  60mL OMNIPAQUE IOHEXOL 350 MG/ML SOLN COMPARISON:  November 15, 2020 FINDINGS: Urinary Tract: Distended otherwise normal bladder. Multiple renal cysts. Bowel: Mild circumferential mucosal thickening of the distal rectum. Formed stool within the proximal rectum, which measures 7 cm in cross-section. Vascular/Lymphatic: No pathologically enlarged lymph nodes. Tortuosity and calcific atherosclerotic disease of the aorta. Reproductive: Enlargement of the prostate gland, which measures 5.9 cm transversely. Other:  None. Musculoskeletal: Spondylosis of the lumbosacral spine. IMPRESSION: 1. Mild circumferential mucosal thickening of the distal rectum, which may represent proctitis or internal hemorrhoids. Formed stool within the proximal rectum, which measures 7 cm in cross-section. 2. Enlargement of the prostate gland. Please correlate to serum PSA values. 3. Distended otherwise normal urinary bladder. 4. Multiple renal cysts. Aortic Atherosclerosis (ICD10-I70.0). Electronically Signed   By: Fidela Salisbury M.D.   On: 11/21/2020 14:20     Medications   Scheduled Meds:  bisacodyl  5 mg Oral Daily   Chlorhexidine Gluconate Cloth  6 each Topical Daily   dicyclomine  20 mg Oral TID AC & HS   [START ON  11/22/2020] diltiazem   Topical BID   feeding supplement  237 mL Oral TID BM   finasteride  5 mg Oral Daily   hydrocortisone-pramoxine   Rectal QID   levothyroxine  175 mcg Oral QAC breakfast   lidocaine   Topical Q6H   polyethylene glycol  17 g Oral BID   senna-docusate  1 tablet Oral BID   sorbitol, milk of mag, mineral oil, glycerin (SMOG) enema  300 mL Rectal BID   tamsulosin  0.4 mg Oral QPC supper   warfarin  7.5 mg Oral ONCE-1600   Warfarin - Pharmacist Dosing Inpatient   Does not apply q1600   Continuous Infusions:     LOS: 3 days    Time spent: 30 minutes with > 50% spent at bedside and in coordination of care     Ezekiel Slocumb, DO [facility]  11/21/2020, 3:11 PM      If 7PM-7AM, please contact night-coverage. How to contact the [facility] Attending or Consulting provider [place] or covering provider during after hours [place], for this patient?    Check the care team in [facility] and look for a) attending/consulting [facility] provider listed and b) the [facility] team listed Log into [email or web address] and use [place] universal password to access. If you do not have the password, please contact the hospital operator. Locate the [facility] provider you are looking for under [facility] and page to a number that you can be directly reached. If you still have difficulty reaching the provider, please page the [facility] (Director on Call) for the Hospitalists listed on amion for assistance.

## 2020-11-21 NOTE — Progress Notes (Signed)
PROGRESS NOTE    Clarence Hopkins   DJM:[id number]  DOB: 03/24/1930  PCP: Cassandria Anger, MD    DOA: 11/17/2020 LOS: 3    Brief Narrative / Hospital Course to Date:   Pt admitted with rectal bleeding in the setting of supratherapeutic INR and recent fecal manual disimpaction.  Also with severely painful rectal vs bladder spasms that have been difficult to get controlled.  GI and Urology are consulted.  Assessment & Plan   Principal Problem:   Rectal bleed Active Problems:   Protein C deficiency (HCC)   PULMONARY EMBOLISM, HX OF   Long term (current) use of anticoagulants   Hypertriglyceridemia   Rectal bleeding   Pressure injury of skin   Rectal bleeding /hematochezia-with supratherapeutic INR and recent fecal disimpaction, suspect due to minor trauma. Given oral vitamin K in the ED on admission. No bleeding reported since admission.   Hemoglobin stable.  --Hold warfarin --Monitor hemoglobin and transfuse if less than 7  Supratherapeutic INR -presented with INR 4.8 in setting of rectal bleeding.   10/8: INR up to 4.6, given 5 mg PO vitamin K 10/9: INR down to 2.1 --Daily INR --Resume warfarin per pharmacy dosing --Monitor closely for bleeding  Bladder versus rectal spasms -B&O suppository started by urology, Bentyl trial, PR .  Persistent despite  --Urology and GI consulted, see their recommendations --Surgery consulted and recommend topical diltiazem, topical lidocaine.   Suspect due to fecal impaction vs fissure. --Avoid narcotics  --Repeat CT pelvis with contrast today to reassess  Urinary retention  -patient follows with urologist, Dr. Sandrea Matte. --Continue pain control per orders --Resume home Gemtesa (patient's own med) --Consider urology consult if bladder spasm pain not improving --Continue in/out cath's as needed --Outpatient urology follow-up  Left lower extremity wound s/p recent excision of skin cancer - WOC consulted.  See their wound care  instructions.  History of PE History of protein C deficiency Chronically anticoagulated with warfarin. -- Warfarin held - resume per pharmacy  Hypertriglyceridemia -not on home medication  Hypothyroidism -continue levothyroxine  BPH -continue tamsulosin and finasteride  Hx of metastatic thyroid cancer, skin cancer - follows with oncology, dermatology  Patient BMI: Body mass index is 25.53 kg/m.   DVT prophylaxis: SCDs Start: 11/18/20 0113 warfarin (COUMADIN) tablet 7.5 mg   Diet:  Diet Orders (From admission, onward)     Start     Ordered   11/21/20 0842  DIET SOFT Room service appropriate? Yes; Fluid consistency: Thin  Diet effective now       Question Answer Comment  Room service appropriate? Yes   Fluid consistency: Thin      11/21/20 0841              Code Status: Full Code   Subjective 11/21/20    Patient still having frequent bouts of rectal / anal pain or spasms every few minutes that are debilitating.  So far no improvement with measures taken thus far.  Patient and family understandably frustrated that he continues to suffer.  Per bedside RN, patient did not tolerate enema so it was not fully administered this AM.   Disposition Plan & Communication   Status is: Inpatient  Remains inpatient appropriate because: INR remains supratherapeutic, patient requires further monitoring due to rectal bleeding  Dispo: The patient is from: Home              Anticipated d/c is to: Home  Patient currently is not medically stable to d/c.   Difficult to place patient No    Family Communication: daughter at bedside on rounds today 10/9.   Consults, Procedures, Significant Events   Consultants:  None  Procedures:  None  Antimicrobials:  Anti-infectives (From admission, onward)    None         Micro    Objective   Vitals:   11/20/20 0514 11/20/20 1200 11/20/20 2000 11/21/20 0556  BP: 124/88 122/88 99/62 (!) 152/78  Pulse: 65 62 68  65  Resp: 20 16 20 18   Temp: 98.2 F (36.8 C) 98.1 F (36.7 C) 98.2 F (36.8 C) 98 F (36.7 C)  TempSrc: Oral Oral Oral Oral  SpO2: 97% 94% 93% 94%  Weight:      Height:        Intake/Output Summary (Last 24 hours) at 11/21/2020 1520 Last data filed at 11/21/2020 1513 Gross per 24 hour  Intake 330 ml  Output 1300 ml  Net -970 ml    Filed Weights   11/17/20 1910  Weight: 93.9 kg    Physical Exam:  General exam: awake, alert, intermittent distress due to pain every few minutes Respiratory system: lungs b/l, normal respiratory effort, on room air Cardiovascular system: normal S1/S2, RRR Gastrointestinal system: Abdomen soft and nontender, +bowel sounds. Central nervous system: A&O x3.  Normal speech, grossly nonfocal exam Extremities: Distal left lower extremity wound image from 10/8 below       Labs   Data Reviewed: I have personally reviewed following labs and imaging studies  CBC: Recent Labs  Lab 11/15/20 2050 11/17/20 2115 11/18/20 0145 11/18/20 0808 11/18/20 1653 11/18/20 1832 11/20/20 0356 11/21/20 0352  WBC 7.8 7.8 6.4 6.0 5.6 7.2  --   --   NEUTROABS 6.5 5.7  --   --   --   --   --   --   HGB 12.4* 12.6* 11.2* 11.6* 11.7* 12.1* 11.8* 11.3*  HCT 39.7 40.1 35.0* 37.4* 37.5* 39.1 36.9* 36.0*  MCV 89.4 89.9 90.0 92.1 90.1 90.9  --   --   PLT 199 198 181 174 196 193  --   --     Basic Metabolic Panel: Recent Labs  Lab 11/15/20 2050 11/17/20 2115 11/18/20 0149 11/19/20 0554  NA 137 133* 139 136  K 4.2 4.1 3.8 4.5  CL 100 100 107 103  CO2 28 28 25 28   GLUCOSE 111* 96 85 101*  BUN 17 17 13 17   CREATININE 1.14 1.16 1.05 1.11  CALCIUM 9.2 9.0 8.4* 8.8*    GFR: Estimated Creatinine Clearance: 55.8 mL/min (by C-G formula based on SCr of 1.11 mg/dL). Liver Function Tests: Recent Labs  Lab 11/15/20 2050 11/17/20 2115 11/18/20 0149  AST 22 29 23   ALT 16 17 15   ALKPHOS 97 109 90  BILITOT 0.8 1.2 0.9  PROT 7.4 7.3 6.2*  ALBUMIN 4.1 3.8  3.1*    Recent Labs  Lab 11/15/20 2050  LIPASE 14    No results for input(s): AMMONIA in the last 168 hours. Coagulation Profile: Recent Labs  Lab 11/17/20 2115 11/18/20 0900 11/19/20 0554 11/20/20 0356 11/21/20 0352  INR 4.8* 4.7* 4.2* 4.6* 2.1*    Cardiac Enzymes: No results for input(s): CKTOTAL, CKMB, CKMBINDEX, TROPONINI in the last 168 hours. BNP (last 3 results) No results for input(s): PROBNP in the last 8760 hours. HbA1C: No results for input(s): HGBA1C in the last 72 hours. CBG: Recent Labs  Lab 11/17/20  1909  GLUCAP 83    Lipid Profile: No results for input(s): CHOL, HDL, LDLCALC, TRIG, CHOLHDL, LDLDIRECT in the last 72 hours. Thyroid Function Tests: No results for input(s): TSH, T4TOTAL, FREET4, T3FREE, THYROIDAB in the last 72 hours. Anemia Panel: No results for input(s): VITAMINB12, FOLATE, FERRITIN, TIBC, IRON, RETICCTPCT in the last 72 hours. Sepsis Labs: No results for input(s): PROCALCITON, LATICACIDVEN in the last 168 hours.  Recent Results (from the past 240 hour(s))  Resp Panel by RT-PCR (Flu A&B, Covid) Nasopharyngeal Swab     Status: None   Collection Time: 11/18/20 12:02 AM   Specimen: Nasopharyngeal Swab; Nasopharyngeal(NP) swabs in vial transport medium  Result Value Ref Range Status   SARS Coronavirus 2 by RT PCR NEGATIVE NEGATIVE Final    Comment: (NOTE) SARS-CoV-2 target nucleic acids are NOT DETECTED.  The SARS-CoV-2 RNA is generally detectable in upper respiratory specimens during the acute phase of infection. The lowest concentration of SARS-CoV-2 viral copies this assay can detect is 138 copies/mL. A negative result does not preclude SARS-Cov-2 infection and should not be used as the sole basis for treatment or other patient management decisions. A negative result may occur with  improper specimen collection/handling, submission of specimen other than nasopharyngeal swab, presence of viral mutation(s) within the areas  targeted by this assay, and inadequate number of viral copies(<138 copies/mL). A negative result must be combined with clinical observations, patient history, and epidemiological information. The expected result is Negative.  Fact Sheet for Patients:  EntrepreneurPulse.com.au  Fact Sheet for Healthcare Providers:  IncredibleEmployment.be  This test is no t yet approved or cleared by the [place] FDA and  has been authorized for detection and/or diagnosis of SARS-CoV-2 by FDA under an Emergency Use Authorization (EUA). This EUA will remain  in effect (meaning this test can be used) for the duration of the COVID-19 declaration under Section 564(b)(1) of the Act, 21 U.S.C.section 360bbb-3(b)(1), unless the authorization is terminated  or revoked sooner.       Influenza A by PCR NEGATIVE NEGATIVE Final   Influenza B by PCR NEGATIVE NEGATIVE Final    Comment: (NOTE) The Xpert Xpress SARS-CoV-2/FLU/RSV plus assay is intended as an aid in the diagnosis of influenza from Nasopharyngeal swab specimens and should not be used as a sole basis for treatment. Nasal washings and aspirates are unacceptable for Xpert Xpress SARS-CoV-2/FLU/RSV testing.  Fact Sheet for Patients: EntrepreneurPulse.com.au  Fact Sheet for Healthcare Providers: IncredibleEmployment.be  This test is not yet approved or cleared by the [place] FDA and has been authorized for detection and/or diagnosis of SARS-CoV-2 by FDA under an Emergency Use Authorization (EUA). This EUA will remain in effect (meaning this test can be used) for the duration of the COVID-19 declaration under Section 564(b)(1) of the Act, 21 U.S.C. section 360bbb-3(b)(1), unless the authorization is terminated or revoked.  Performed at [facility], [place] [address], [place], [place] [address]        Imaging Studies   DG Abd 1 View  Result  Date: 11/19/2020 CLINICAL DATA:  Fecal impaction EXAM: ABDOMEN - 1 VIEW COMPARISON:  CT 11/15/2020 FINDINGS: Mild diffuse small and large bowel gas without convincing obstructive pattern. Moderate stool in the colon and small stool in the rectum. No radiopaque calculi IMPRESSION: Overall nonobstructed gas pattern with moderate stool in the colon Electronically Signed   By: Donavan Foil M.D.   On: 11/19/2020 17:24   CT PELVIS W CONTRAST  Result Date: 11/21/2020 CLINICAL DATA:  Severe persistent rectal pain. EXAM: CT PELVIS WITH CONTRAST TECHNIQUE: Multidetector CT imaging of the pelvis was performed using the standard protocol following the bolus administration of intravenous contrast. CONTRAST:  36mL OMNIPAQUE IOHEXOL 350 MG/ML SOLN COMPARISON:  November 15, 2020 FINDINGS: Urinary Tract: Distended otherwise normal bladder. Multiple renal cysts. Bowel: Mild circumferential mucosal thickening of the distal rectum. Formed stool within the proximal rectum, which measures 7 cm in cross-section. Vascular/Lymphatic: No pathologically enlarged lymph nodes. Tortuosity and calcific atherosclerotic disease of the aorta. Reproductive: Enlargement of the prostate gland, which measures 5.9 cm transversely. Other:  None. Musculoskeletal: Spondylosis of the lumbosacral spine. IMPRESSION: 1. Mild circumferential mucosal thickening of the distal rectum, which may represent proctitis or internal hemorrhoids. Formed stool within the proximal rectum, which measures 7 cm in cross-section. 2. Enlargement of the prostate gland. Please correlate to serum PSA values. 3. Distended otherwise normal urinary bladder. 4. Multiple renal cysts. Aortic Atherosclerosis (ICD10-I70.0). Electronically Signed   By: Fidela Salisbury M.D.   On: 11/21/2020 14:20     Medications   Scheduled Meds:  bisacodyl  5 mg Oral Daily   Chlorhexidine Gluconate Cloth  6 each Topical Daily   dicyclomine  20 mg Oral TID AC & HS   [START ON 11/22/2020]  diltiazem   Topical BID   feeding supplement  237 mL Oral TID BM   finasteride  5 mg Oral Daily   hydrocortisone-pramoxine   Rectal QID   levothyroxine  175 mcg Oral QAC breakfast   lidocaine   Topical Q6H   polyethylene glycol  17 g Oral BID   senna-docusate  1 tablet Oral BID   sorbitol, milk of mag, mineral oil, glycerin (SMOG) enema  300 mL Rectal BID   tamsulosin  0.4 mg Oral QPC supper   warfarin  7.5 mg Oral ONCE-1600   Warfarin - Pharmacist Dosing Inpatient   Does not apply q1600   Continuous Infusions:     LOS: 3 days    Time spent: 30 minutes with > 50% spent at bedside and in coordination of care     Ezekiel Slocumb, DO [facility]  11/21/2020, 3:20 PM      If 7PM-7AM, please contact night-coverage. How to contact the [facility] Attending or Consulting provider [place] or covering provider during after hours [place], for this patient?    Check the care team in [facility] and look for a) attending/consulting [facility] provider listed and b) the [facility] team listed Log into [email or web address] and use [place] universal password to access. If you do not have the password, please contact the hospital operator. Locate the [facility] provider you are looking for under [facility] and page to a number that you can be directly reached. If you still have difficulty reaching the provider, please page the [facility] (Director on Call) for the Hospitalists listed on amion for assistance.

## 2020-11-21 NOTE — Consult Note (Signed)
Surgical Evaluation Requesting provider: Dr. Arbutus Ped  Chief Complaint: Proctalgia  HPI: 87 year old gentleman with history as listed below who has been admitted for 4 days with initially rectal bleeding in the setting of supratherapeutic INR and recent manual fecal disimpaction.  He has been experiencing difficult to control spasm type pain in the pelvis which was initially thought to be bladder in etiology but did not resolve with catheterization or removal of Foley and is now thought to be more secondary to mucosal irritation from recent and ongoing fecal impaction.  He is treated for ongoing fecal impaction with enema/suppositories.  General surgery consulted for further recommendations regarding the rectal pain. I saw him today around 12:30pm.  At this time he was quite comfortable and noted that he had been feeling good for several hours.  His family at the bedside noted that this is the best that he has looked/felt.  It does not appear that he had received any significant pain medications other than Tylenol this morning.  No Known Allergies  Past Medical History:  Diagnosis Date   Arthritis    Cancer ([place]) 2003   melanoma  L ear   GERD (gastroesophageal reflux disease)    Hypothyroidism    post op   Lung mass    Pulmonary embolism ([place]) 2005 & 2007   protein C deficiency    Past Surgical History:  Procedure Laterality Date   BRONCHIAL BRUSHINGS  07/15/2019   Procedure: BRONCHIAL BRUSHINGS;  Surgeon: Rigoberto Noel, MD;  Location: [place];  Service: Cardiopulmonary;;   BRONCHIAL WASHINGS  07/15/2019   Procedure: BRONCHIAL WASHINGS;  Surgeon: Rigoberto Noel, MD;  Location: [place];  Service: Cardiopulmonary;;   CARDIAC CATHETERIZATION     > 20 years ago " everything was okay"    CERVICAL FUSION      X 2; Dr Vertell Limber   COLONOSCOPY     ENDOBRONCHIAL ULTRASOUND N/A 07/15/2019   Procedure: ENDOBRONCHIAL ULTRASOUND;  Surgeon: Rigoberto Noel, MD;  Location: [place];  Service:  Cardiopulmonary;  Laterality: N/A;   FINE NEEDLE ASPIRATION  07/15/2019   Procedure: FINE NEEDLE ASPIRATION (FNA) LINEAR;  Surgeon: Rigoberto Noel, MD;  Location: [place];  Service: Cardiopulmonary;;   FINGER ARTHROPLASTY Right 04/17/2013   Procedure: IMPLANT ARTHROPLASTY RIGHT INDEX;  Surgeon: Cammie Sickle., MD;  Location: [place];  Service: Orthopedics;  Laterality: Right;   LUMBAR LAMINECTOMY  2010   Dr Becky Sax   MELANOMA EXCISION  2003   L ear   THYROIDECTOMY  1971   cytology indefinite   TONSILLECTOMY     TOTAL KNEE ARTHROPLASTY  2004   left   VIDEO BRONCHOSCOPY N/A 07/15/2019   Procedure: VIDEO BRONCHOSCOPY WITHOUT FLUORO;  Surgeon: Rigoberto Noel, MD;  Location: [place];  Service: Cardiopulmonary;  Laterality: N/A;   WISDOM TOOTH EXTRACTION      Family History  Problem Relation Age of Onset   Breast cancer Mother    Alzheimer's disease Father    Breast cancer Maternal Grandmother    Breast cancer Sister    Cancer Sister    Diabetes Neg Hx    Stroke Neg Hx    Heart disease Neg Hx     Social History   Socioeconomic History   Marital status: Married    Spouse name: Not on file   Number of children: 4   Years of education: 15   Highest education level: Not on file  Occupational History   Not on  file  Tobacco Use   Smoking status: Former    Packs/day: 0.75    Years: 41.00    Pack years: 30.75    Types: Cigarettes    Quit date: 02/13/1993    Years since quitting: 27.7   Smokeless tobacco: Never   Tobacco comments:    smoked 1956-1995, up to 1/2 ppd  Vaping Use   Vaping Use: Never used  Substance and Sexual Activity   Alcohol use: Yes    Comment:  Vodka 2 oz/ night   Drug use: No   Sexual activity: Not on file  Other Topics Concern   Not on file  Social History Narrative   Fun: Plays golf   Denies abuse and feels safe at home   Social Determinants of Health   Financial Resource Strain: Not on file  Food Insecurity: Not on  file  Transportation Needs: Not on file  Physical Activity: Not on file  Stress: Not on file  Social Connections: Not on file    No current facility-administered medications on file prior to encounter.   Current Outpatient Medications on File Prior to Encounter  Medication Sig Dispense Refill   finasteride (PROSCAR) 5 MG tablet Take 5 mg by mouth daily.     levothyroxine (SYNTHROID) 175 MCG tablet Take 175 mcg by mouth daily before breakfast.     polyvinyl alcohol (LIQUIFILM TEARS) 1.4 % ophthalmic solution Place 1 drop into both eyes as needed for dry eyes.     tamsulosin (FLOMAX) 0.4 MG CAPS capsule TAKE 1 CAPSULE BY MOUTH DAILY AFTER SUPPER (Patient taking differently: Take 0.4 mg by mouth daily after supper.) 90 capsule 3   Vibegron (GEMTESA) 75 MG TABS Take 75 mg by mouth daily.     warfarin (COUMADIN) 5 MG tablet TAKE 1 TABLET DAILY OR AS DIRECTED BY ANTICOAGULATION CLINIC (Patient taking differently: Take 2.5-5 mg by mouth See admin instructions. 2.5mg  on Thursday, Saturday, Sunday 5mg  on Friday) 90 tablet 1   bisacodyl (DULCOLAX) 10 MG suppository Place 1 suppository (10 mg total) rectally as needed for moderate constipation. (Patient not taking: Reported on 11/18/2020) 12 suppository 0    Review of Systems: a complete, 10pt review of systems was completed with pertinent positives and negatives as documented in the HPI  Physical Exam: Vitals:   11/20/20 2000 11/21/20 0556  BP: 99/62 (!) 152/78  Pulse: 68 65  Resp: 20 18  Temp: 98.2 F (36.8 C) 98 F (36.7 C)  SpO2: 93% 94%   Gen: A&Ox3, no distress  Eyes: lids and conjunctivae normal, no icterus. Pupils equally round and reactive to light.  Neck: supple without mass or thyromegaly Chest: respiratory effort is normal. No crepitus or tenderness on palpation of the chest. Breath sounds equal.  Cardiovascular: RRR with palpable distal pulses, no pedal edema Gastrointestinal: soft, nondistended, nontender. No mass,  hepatomegaly or splenomegaly.  Neuro: cranial nerves grossly intact.  Sensation intact to light touch diffusely. Psych: appropriate mood and affect, normal insight/judgment intact  Skin: warm and dry Rectal exam deferred as patient has several family members visiting and is feeling significantly better at this time.   CBC Latest Ref Rng & Units 11/21/2020 11/20/2020 11/18/2020  WBC 4.0 - 10.5 K/uL - - 7.2  Hemoglobin 13.0 - 17.0 g/dL 11.3(L) 11.8(L) 12.1(L)  Hematocrit 39.0 - 52.0 % 36.0(L) 36.9(L) 39.1  Platelets 150 - 400 K/uL - - 193    CMP Latest Ref Rng & Units 11/19/2020 11/18/2020 11/17/2020  Glucose 70 - 99  mg/dL 101(H) 85 96  BUN 8 - 23 mg/dL 17 13 17   Creatinine 0.61 - 1.24 mg/dL 1.11 1.05 1.16  Sodium 135 - 145 mmol/L 136 139 133(L)  Potassium 3.5 - 5.1 mmol/L 4.5 3.8 4.1  Chloride 98 - 111 mmol/L 103 107 100  CO2 22 - 32 mmol/L 28 25 28   Calcium 8.9 - 10.3 mg/dL 8.8(L) 8.4(L) 9.0  Total Protein 6.5 - 8.1 g/dL - 6.2(L) 7.3  Total Bilirubin 0.3 - 1.2 mg/dL - 0.9 1.2  Alkaline Phos 38 - 126 U/L - 90 109  AST 15 - 41 U/L - 23 29  ALT 0 - 44 U/L - 15 17    Lab Results  Component Value Date   INR 2.1 (H) 11/21/2020   INR 4.6 (HH) 11/20/2020   INR 4.2 (HH) 11/19/2020   PROTIME 19.2 07/14/2008    Imaging: CT PELVIS W CONTRAST  Result Date: 11/21/2020 CLINICAL DATA:  Severe persistent rectal pain. EXAM: CT PELVIS WITH CONTRAST TECHNIQUE: Multidetector CT imaging of the pelvis was performed using the standard protocol following the bolus administration of intravenous contrast. CONTRAST:  62mL OMNIPAQUE IOHEXOL 350 MG/ML SOLN COMPARISON:  November 15, 2020 FINDINGS: Urinary Tract: Distended otherwise normal bladder. Multiple renal cysts. Bowel: Mild circumferential mucosal thickening of the distal rectum. Formed stool within the proximal rectum, which measures 7 cm in cross-section. Vascular/Lymphatic: No pathologically enlarged lymph nodes. Tortuosity and calcific atherosclerotic  disease of the aorta. Reproductive: Enlargement of the prostate gland, which measures 5.9 cm transversely. Other:  None. Musculoskeletal: Spondylosis of the lumbosacral spine. IMPRESSION: 1. Mild circumferential mucosal thickening of the distal rectum, which may represent proctitis or internal hemorrhoids. Formed stool within the proximal rectum, which measures 7 cm in cross-section. 2. Enlargement of the prostate gland. Please correlate to serum PSA values. 3. Distended otherwise normal urinary bladder. 4. Multiple renal cysts. Aortic Atherosclerosis (ICD10-I70.0). Electronically Signed   By: Fidela Salisbury M.D.   On: 11/21/2020 14:20     A/P: Proctalgia with ongoing fecal impaction, currently being treated with smog enemas and MiraLAX.  Given bleeding and pain another consideration would be anal fissure but this has not been seen on any exams and reportedly his pain seems "higher "than the anus.  Could consider topical diltiazem/lidocaine with bowel movements, but would continue treatment as recommended by Dr. Benson Norway.  Fortunately this afternoon he seems to be feeling much better.  I reviewed his repeat CT scan from today and there is edema of the rectal wall distally but no evidence of perforation or abscess.  I went over this with his family.  If symptoms return/persist agree w Dr. Benson Norway a repeat manual disimpaction may be indicated, perhaps exam under anesthesia along with this would be helpful.  Hopefully though he will not have to undergo this as his symptoms are improving.  Will continue to follow    Patient Active Problem List   Diagnosis Date Noted   Rectal bleeding 11/18/2020   Pressure injury of skin 11/18/2020   Rectal bleed 11/17/2020   Multifocal pneumonia 08/04/2020   Right lower lobe lung mass    DOE (dyspnea on exertion) 06/03/2019   Near syncope 06/03/2019   Dysphagia 03/07/2018   Thyroid cancer ([place]) 02/10/2016   Medicare annual wellness visit, subsequent 02/02/2015    Hypertriglyceridemia 12/22/2013   Encounter for therapeutic drug monitoring 04/09/2013   History of basal cell cancer 10/30/2012   Long term (current) use of anticoagulants 05/23/2010   Protein C deficiency ([place]) 11/05/2009  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

## 2020-11-21 NOTE — Plan of Care (Signed)
  Problem: Activity: Goal: Risk for activity intolerance will decrease Outcome: Completed/Met

## 2020-11-22 ENCOUNTER — Ambulatory Visit: Payer: Medicare Other

## 2020-11-22 DIAGNOSIS — K5641 Fecal impaction: Secondary | ICD-10-CM

## 2020-11-22 DIAGNOSIS — K6289 Other specified diseases of anus and rectum: Secondary | ICD-10-CM

## 2020-11-22 DIAGNOSIS — K625 Hemorrhage of anus and rectum: Secondary | ICD-10-CM | POA: Diagnosis not present

## 2020-11-22 LAB — HEMOGLOBIN AND HEMATOCRIT, BLOOD
HCT: 36.6 % — ABNORMAL LOW (ref 39.0–52.0)
Hemoglobin: 11.9 g/dL — ABNORMAL LOW (ref 13.0–17.0)

## 2020-11-22 LAB — PROTIME-INR
INR: 1.6 — ABNORMAL HIGH (ref 0.8–1.2)
Prothrombin Time: 19.2 seconds — ABNORMAL HIGH (ref 11.4–15.2)

## 2020-11-22 MED ORDER — DILTIAZEM GEL 2 %
Freq: Three times a day (TID) | CUTANEOUS | Status: DC
  Filled 2020-11-22 (×2): qty 30

## 2020-11-22 MED ORDER — WARFARIN SODIUM 5 MG PO TABS
7.5000 mg | ORAL_TABLET | Freq: Once | ORAL | Status: AC
  Administered 2020-11-22: 7.5 mg via ORAL
  Filled 2020-11-22: qty 1

## 2020-11-22 NOTE — Progress Notes (Signed)
   Subjective/Chief Complaint: Pt's had mult BMs overnight with min pain today    Objective: Vital signs in last 24 hours: Temp:  [97.7 F (36.5 C)-98 F (36.7 C)] 97.7 F (36.5 C) (10/10 0441) Pulse Rate:  [66-87] 66 (10/10 0441) Resp:  [18-20] 20 (10/10 0441) BP: (133-145)/(63-89) 145/89 (10/10 0441) SpO2:  [95 %-97 %] 95 % (10/10 0441) Last BM Date: 11/21/20  Intake/Output from previous day: 10/09 0701 - 10/10 0700 In: -  Out: 1875 [Urine:1875] Intake/Output this shift: No intake/output data recorded.  PE:  Constitutional: No acute distress, conversant, appears states age. Eyes: Anicteric sclerae, moist conjunctiva, no lid lag Lungs: Clear to auscultation bilaterally, normal respiratory effort CV: regular rate and rhythm, no murmurs, no peripheral edema, pedal pulses 2+ GI: Soft, no masses or hepatosplenomegaly, non-tender to palpation Skin: No rashes, palpation reveals normal turgor Psychiatric: appropriate judgment and insight, oriented to person, place, and time   Lab Results:  Recent Labs    11/21/20 0352 11/22/20 0403  HGB 11.3* 11.9*  HCT 36.0* 36.6*   BMET No results for input(s): NA, K, CL, CO2, GLUCOSE, BUN, CREATININE, CALCIUM in the last 72 hours. PT/INR Recent Labs    11/21/20 0352 11/22/20 0403  LABPROT 23.4* 19.2*  INR 2.1* 1.6*   ABG No results for input(s): PHART, HCO3 in the last 72 hours.  Invalid input(s): PCO2, PO2  Studies/Results: CT PELVIS W CONTRAST  Result Date: 11/21/2020 CLINICAL DATA:  Severe persistent rectal pain. EXAM: CT PELVIS WITH CONTRAST TECHNIQUE: Multidetector CT imaging of the pelvis was performed using the standard protocol following the bolus administration of intravenous contrast. CONTRAST:  66mL OMNIPAQUE IOHEXOL 350 MG/ML SOLN COMPARISON:  November 15, 2020 FINDINGS: Urinary Tract: Distended otherwise normal bladder. Multiple renal cysts. Bowel: Mild circumferential mucosal thickening of the distal rectum.  Formed stool within the proximal rectum, which measures 7 cm in cross-section. Vascular/Lymphatic: No pathologically enlarged lymph nodes. Tortuosity and calcific atherosclerotic disease of the aorta. Reproductive: Enlargement of the prostate gland, which measures 5.9 cm transversely. Other:  None. Musculoskeletal: Spondylosis of the lumbosacral spine. IMPRESSION: 1. Mild circumferential mucosal thickening of the distal rectum, which may represent proctitis or internal hemorrhoids. Formed stool within the proximal rectum, which measures 7 cm in cross-section. 2. Enlargement of the prostate gland. Please correlate to serum PSA values. 3. Distended otherwise normal urinary bladder. 4. Multiple renal cysts. Aortic Atherosclerosis (ICD10-I70.0). Electronically Signed   By: Fidela Salisbury M.D.   On: 11/21/2020 14:20       Assessment/Plan: 43M with constipation Principal Problem:   Rectal bleed Active Problems:   Protein C deficiency (HCC)   PULMONARY EMBOLISM, HX OF   Long term (current) use of anticoagulants   Hypertriglyceridemia   Rectal bleeding   Pressure injury of skin  Pt appears to be having mult BMs. No need for surgery Please call if needed.     LOS: 4 days    Ralene Ok 11/22/2020

## 2020-11-22 NOTE — Progress Notes (Signed)
ANTICOAGULATION CONSULT NOTE -   Consult  Pharmacy Consult for Warfarin Indication: h/o PE and protein C deficiency  No Known Allergies  Patient Measurements: Height: 6' 3.5" (191.8 cm) Weight: 93.9 kg (207 lb) IBW/kg (Calculated) : 85.65  Vital Signs: Temp: 97.7 F (36.5 C) (10/10 0441) Temp Source: Oral (10/10 0441) BP: 145/89 (10/10 0441) Pulse Rate: 66 (10/10 0441)  Labs: Recent Labs    11/20/20 0356 11/21/20 0352 11/22/20 0403  HGB 11.8* 11.3* 11.9*  HCT 36.9* 36.0* 36.6*  LABPROT 43.2* 23.4* 19.2*  INR 4.6* 2.1* 1.6*     Estimated Creatinine Clearance: 55.8 mL/min (by C-G formula based on SCr of 1.11 mg/dL).   Medical History: Past Medical History:  Diagnosis Date   Arthritis    Cancer ([place]) 2003   melanoma  L ear   GERD (gastroesophageal reflux disease)    Hypothyroidism    post op   Lung mass    Pulmonary embolism ([place]) 2005 & 2007   protein C deficiency    Medications:  Warfarin 5 mg daily except 7.5 mg MWF PTA last dose- Med Mgmt note from 10/5 instructed pt/caregiver to reduce dose of warfarin due to recent Rx for Levaquin- 10/5 was last dose should have been 5 mg per office instructions but unclear if took 5 mg or 7.5 mg   Assessment: 87 y/o M on chronic warfarin for h/o PE and protein C deficiency admitted with rectal bleeding and supra-therapeutic INR. Given 2.5 mg vitamin K po 10/5 and repeated 5 mg po 10/8 with INR now finally in therapeutic range. No significant DDI with current regimen.   Today, 11/22/20 INR 1.6, subtherapeutic  Hgb 11.9 stable  Meal intake not charted    Goal of Therapy:  INR 2-3   Plan:  Warfarin 7.5 mg once today PT/INR daily Monitor for signs and symptoms of bleeding     Royetta Asal, PharmD, BCPS 11/22/2020 8:34 AM

## 2020-11-22 NOTE — Progress Notes (Signed)
PT Cancellation Note  Patient Details Name: Clarence Hopkins MRN: [id number] DOB: 08/13/1930   Cancelled Treatment:    Reason Eval/Treat Not Completed: Other (comment) Visitor in room requested to allow pt to rest this morning.  Upon checking back this afternoon, pt sleeping.  Pt briefly awakened however returned to sleep.  Will check back as schedule permits.   [name] 11/22/2020, 3:03 PM Arlyce Dice, DPT Acute Rehabilitation Services Pager: [phone] Office: [phone]

## 2020-11-22 NOTE — Progress Notes (Signed)
Subjective: Clarence Hopkins continues to have episodic anal pain that wasn't impacted by foley removal.  He had a bad night but has had a couple of good BM's and is feeling better this morning. He  is tolerating the CIC.     ROS:  Review of Systems  Constitutional:  Negative for chills and fever.   Anti-infectives: Anti-infectives (From admission, onward)    None       Current Facility-Administered Medications  Medication Dose Route Frequency Provider Last Rate Last Admin   acetaminophen (TYLENOL) tablet 650 mg  650 mg Oral Q6H PRN Nicole Kindred A, DO   650 mg at 11/21/20 2201   belladonna-opium (B&O) suppository 16.2-30 mg  1 suppository Rectal Q6H PRN Irine Seal, MD       bisacodyl (DULCOLAX) EC tablet 5 mg  5 mg Oral Daily Nicole Kindred A, DO   5 mg at 11/21/20 1122   Chlorhexidine Gluconate Cloth 2 % PADS 6 each  6 each Topical Daily Nicole Kindred A, DO   6 each at 11/20/20 5035   dicyclomine (BENTYL) tablet 20 mg  20 mg Oral TID AC & HS Nicole Kindred A, DO   20 mg at 11/21/20 2300   diltiazem 2 % gel   Topical BID Nicole Kindred A, DO       feeding supplement (ENSURE ENLIVE / ENSURE PLUS) liquid 237 mL  237 mL Oral TID BM Nicole Kindred A, DO   237 mL at 11/21/20 2203   finasteride (PROSCAR) tablet 5 mg  5 mg Oral Daily Nicole Kindred A, DO   5 mg at 11/21/20 0809   hydrocortisone-pramoxine (ANALPRAM-HC) 2.5-1 % rectal cream   Rectal QID Esterwood, Amy S, PA-C   1 application at 46/56/81 0515   levothyroxine (SYNTHROID) tablet 175 mcg  175 mcg Oral QAC breakfast Nicole Kindred A, DO   175 mcg at 11/22/20 0519   lidocaine (XYLOCAINE) 5 % ointment   Topical Q6H Esterwood, Amy S, PA-C   1 application at 27/51/70 0515   metoprolol tartrate (LOPRESSOR) injection 5 mg  5 mg Intravenous Q6H PRN Elwyn Reach, MD       ondansetron (ZOFRAN) tablet 4 mg  4 mg Oral Q6H PRN Elwyn Reach, MD       Or   ondansetron (ZOFRAN) injection 4 mg  4 mg Intravenous Q6H PRN Elwyn Reach, MD       polyethylene glycol (MIRALAX / GLYCOLAX) packet 17 g  17 g Oral BID Esterwood, Amy S, PA-C   17 g at 11/21/20 2203   polyvinyl alcohol (LIQUIFILM TEARS) 1.4 % ophthalmic solution 1 drop  1 drop Both Eyes PRN Nicole Kindred A, DO       senna-docusate (Senokot-S) tablet 1 tablet  1 tablet Oral BID Nicole Kindred A, DO   1 tablet at 11/21/20 2201   sorbitol, milk of mag, mineral oil, glycerin (SMOG) enema  300 mL Rectal BID Carol Ada, MD   300 mL at 11/22/20 0500   tamsulosin (FLOMAX) capsule 0.4 mg  0.4 mg Oral QPC supper Nicole Kindred A, DO   0.4 mg at 11/21/20 1853   Warfarin - Pharmacist Dosing Inpatient   Does not apply q1600 Nicole Kindred A, DO         Objective: Vital signs in last 24 hours: Temp:  [97.7 F (36.5 C)-98 F (36.7 C)] 97.7 F (36.5 C) (10/10 0441) Pulse Rate:  [66-87] 66 (10/10 0441) Resp:  [18-20] 20 (10/10 0441)  BP: (133-145)/(63-89) 145/89 (10/10 0441) SpO2:  [95 %-97 %] 95 % (10/10 0441)  Intake/Output from previous day: 10/09 0701 - 10/10 0700 In: -  Out: 1875 [Urine:1875] Intake/Output this shift: No intake/output data recorded.   Physical Exam  Lab Results:  Recent Labs    11/21/20 0352 11/22/20 0403  HGB 11.3* 11.9*  HCT 36.0* 36.6*    BMET No results for input(s): NA, K, CL, CO2, GLUCOSE, BUN, CREATININE, CALCIUM in the last 72 hours. PT/INR Recent Labs    11/21/20 0352 11/22/20 0403  LABPROT 23.4* 19.2*  INR 2.1* 1.6*    ABG No results for input(s): PHART, HCO3 in the last 72 hours.  Invalid input(s): PCO2, PO2  Studies/Results: CT PELVIS W CONTRAST  Result Date: 11/21/2020 CLINICAL DATA:  Severe persistent rectal pain. EXAM: CT PELVIS WITH CONTRAST TECHNIQUE: Multidetector CT imaging of the pelvis was performed using the standard protocol following the bolus administration of intravenous contrast. CONTRAST:  51mL OMNIPAQUE IOHEXOL 350 MG/ML SOLN COMPARISON:  November 15, 2020 FINDINGS: Urinary Tract:  Distended otherwise normal bladder. Multiple renal cysts. Bowel: Mild circumferential mucosal thickening of the distal rectum. Formed stool within the proximal rectum, which measures 7 cm in cross-section. Vascular/Lymphatic: No pathologically enlarged lymph nodes. Tortuosity and calcific atherosclerotic disease of the aorta. Reproductive: Enlargement of the prostate gland, which measures 5.9 cm transversely. Other:  None. Musculoskeletal: Spondylosis of the lumbosacral spine. IMPRESSION: 1. Mild circumferential mucosal thickening of the distal rectum, which may represent proctitis or internal hemorrhoids. Formed stool within the proximal rectum, which measures 7 cm in cross-section. 2. Enlargement of the prostate gland. Please correlate to serum PSA values. 3. Distended otherwise normal urinary bladder. 4. Multiple renal cysts. Aortic Atherosclerosis (ICD10-I70.0). Electronically Signed   By: Fidela Salisbury M.D.   On: 11/21/2020 14:20     Assessment and Plan: BPH with urinary retention.   He is tolerating CIC. Continue the finasteride and tamsulosin at this time.  We can consider replacing the foley once the impaction issue has resolved.   Pelvic pain.  This is now more isolated in the anal area and continues to be episodic but has improved this morning after a couple of BM's.          LOS: 4 days    Irine Seal 11/22/2020 [phone] Patient ID: Clarence Hopkins, male   DOB: Jun 28, 1930, 87 y.o.   MRN: [id number] Patient ID: Clarence Hopkins, male   DOB: 1930-05-25, 87 y.o.   MRN: [id number] Patient ID: Clarence Hopkins, male   DOB: Aug 10, 1930, 87 y.o.   MRN: [id number]

## 2020-11-22 NOTE — Care Management Important Message (Signed)
Important Message  Patient Details IM Letter given to the Patient. Name: Clarence Hopkins MRN: [id number] Date of Birth: [birth year]   Medicare Important Message Given:  Yes     Kerin Salen 11/22/2020, 12:35 PM

## 2020-11-22 NOTE — Progress Notes (Signed)
0710- Pt oob without assistance ambulating to bathroom with unsteady gait. Pt adamant about ambulating to toilet in bathroom. Loose Kotch stool noted on pt's bed, on floor, and on this nurse's shoes. Pt assisted to toilet and call bell placed in pt's hand for him to call when done. This nurse and pm nurse standing at pt's door for report.   0730- Partial bath given, bed linens changed, and pt assisted back to bed from bathroom. Family present. Breath sounds clear bilaterally. Abdomen soft, tender, bowel sounds active x4 quadrants. Kerlix dsg to LLE clean, dry, and intact. Large mepilex to sacrum clean, dry, and, intact. Call bell within reach.  1100- Pt sleeping at this time. Pt's wife, Mcneil Sober and daughter, Page present. Will monitor.  1600- 11fr foley catheter placed without difficulty after bladder scan results of 65ml urine present in bladder. Secure chat between Dr. Arbutus Ped and Dr. Jeffie Pollock for order for foley catheter. 848ml clear yellow urine noted immediately. NAD noted.  SMOG enema administered without difficulty. Pt able to retain for several minutes and then assisted to bsc. Complete bed bath given and bed linens changed.   1800- Pt resting quietly with eyes closed. Respirations even and unlabored. Will continue to monitor. Call bell within reach.

## 2020-11-22 NOTE — Progress Notes (Addendum)
[facility] Progress Note  CC:  Rectal bleeding, proctalgia, fecal impaction   Subjective: He reported passing 3 soft nonbloody bowel movements this morning.  The first bowel movement was fairly painful but he is feeling much better at this time.  He feels better today than he has in the past several days.  He is eating a regular diet.  No nausea or vomiting. His son is at the bedside.    Objective:  Vital signs in last 24 hours: Temp:  [97.7 F (36.5 C)-98 F (36.7 C)] 97.7 F (36.5 C) (10/10 0441) Pulse Rate:  [66-87] 66 (10/10 0441) Resp:  [18-20] 20 (10/10 0441) BP: (133-145)/(63-89) 145/89 (10/10 0441) SpO2:  [95 %-97 %] 95 % (10/10 0441) Last BM Date: 11/21/20 General:   Alert 87 year old male fatigued appearing in no acute distress Heart: Regular rate and rhythm, no murmur. Pulm: Breath sounds clear throughout Abdomen: Soft, nondistended.  Positive bowel sounds all 4 quadrants.  No HSM.  Positive bowel sounds all 4 quadrants. Rectal: No external hemorrhoids.  Moderate posterior anal tenderness when anoderm stretch during exam suspect likely posterior fissure, internal exam not done due to increased anal pain. Extremities:  Without edema. Neurologic:  Alert and  oriented x4;  grossly normal neurologically. Psych:  Alert and cooperative. Normal mood and affect.  Intake/Output from previous day: 10/09 0701 - 10/10 0700 In: -  Out: 1875 [Urine:1875] Intake/Output this shift: No intake/output data recorded.  Lab Results: Recent Labs    11/20/20 0356 11/21/20 0352 11/22/20 0403  HGB 11.8* 11.3* 11.9*  HCT 36.9* 36.0* 36.6*   BMET No results for input(s): NA, K, CL, CO2, GLUCOSE, BUN, CREATININE, CALCIUM in the last 72 hours. LFT No results for input(s): PROT, ALBUMIN, AST, ALT, ALKPHOS, BILITOT, BILIDIR, IBILI in the last 72 hours. PT/INR Recent Labs    11/21/20 0352 11/22/20 0403  LABPROT 23.4* 19.2*  INR 2.1* 1.6*   Hepatitis Panel No  results for input(s): HEPBSAG, HCVAB, HEPAIGM, HEPBIGM in the last 72 hours.  CT PELVIS W CONTRAST  Result Date: 11/21/2020 CLINICAL DATA:  Severe persistent rectal pain. EXAM: CT PELVIS WITH CONTRAST TECHNIQUE: Multidetector CT imaging of the pelvis was performed using the standard protocol following the bolus administration of intravenous contrast. CONTRAST:  62mL OMNIPAQUE IOHEXOL 350 MG/ML SOLN COMPARISON:  November 15, 2020 FINDINGS: Urinary Tract: Distended otherwise normal bladder. Multiple renal cysts. Bowel: Mild circumferential mucosal thickening of the distal rectum. Formed stool within the proximal rectum, which measures 7 cm in cross-section. Vascular/Lymphatic: No pathologically enlarged lymph nodes. Tortuosity and calcific atherosclerotic disease of the aorta. Reproductive: Enlargement of the prostate gland, which measures 5.9 cm transversely. Other:  None. Musculoskeletal: Spondylosis of the lumbosacral spine. IMPRESSION: 1. Mild circumferential mucosal thickening of the distal rectum, which may represent proctitis or internal hemorrhoids. Formed stool within the proximal rectum, which measures 7 cm in cross-section. 2. Enlargement of the prostate gland. Please correlate to serum PSA values. 3. Distended otherwise normal urinary bladder. 4. Multiple renal cysts. Aortic Atherosclerosis (ICD10-I70.0). Electronically Signed   By: Fidela Salisbury M.D.   On: 11/21/2020 14:20    Assessment / Plan:  83) 87 year old male with a PMH of metastatic papillary thyroid cancer, melanoma, multifocal pneumonia and PE on Coumadin who was evaluated in the ED on 11/15/2020 due to urinary retention and constipation. CTAP identified  multiple noncalcified lung nodules and lung masses of various sizes, the largest was 3.5 cm consistent with his known metastatic  disease, multiple hepatic cysts with mild inflammatory stranding surround the urinary bladder and large amount of stool in the distal sigmoid and rectum, no  associated inflammatory changes or wall thickening. S/P fecal disimpaction by the ED physician then discharged home. Readmitted to the hospital 10/5 with rectal bleeding and proctalgia.  Stable Hg 12.6. Supratherapeutic INR 4.8. Pelvic CT with contrast 10/9 mild circumferential mucosal thickening of the distal rectum, possibly representing proctitis with formed stool in the proximal rectum. He received several SMOG enemas and Miralax bid and subsequently passed numerous BMs therefore repeat disimpaction/endoscopic evaluation/surgical intervention deferred. External rectal exam today with posterior anal pain, suspect posterior fissure and possible stercoral rectal ulcer. -Limit narcotic use -Continue Miralax bid, Dulcolax 5mg  po QD and Senokot one tab bid for now -Continue Smog enema bid today, reassess continued use tomorrow  -Increase Diltiazem 2% fissure gel to tid  -Continue Analpram HC 2.5% rectal cream quid for the next few days  -CBC, BMP and TSH in am  -No plans for flex sigmoidoscopy at this time  -Await further recommendations per Dr. Henrene Pastor -Seen by general surgery today, no plans for surgical intervention   2) Anemia secondary to  # 1. Hg 12.1 -> 11.9.  -Continue to monitor patient for rectal bleeding  3) History of PE on Coumadin      Principal Problem:   Rectal bleed Active Problems:   Protein C deficiency (HCC)   PULMONARY EMBOLISM, HX OF   Long term (current) use of anticoagulants   Hypertriglyceridemia   Rectal bleeding   Pressure injury of skin     LOS: 4 days   Patrecia Pour Kennedy-Smith  11/22/2020, 10:22AM  GI ATTENDING  Interval history and data reviewed.  Agree with excellent interval progress note as outlined above.  Continue with multiple supportive measures as outlined.  No plans for endoscopic intervention at present.  We will follow.  Docia Chuck. Geri Seminole., M.D. [facility] of Gastroenterology

## 2020-11-22 NOTE — Progress Notes (Signed)
PROGRESS NOTE    Clarence Hopkins   IRJ:[id number]  DOB: 1931-01-20  PCP: Cassandria Anger, MD    DOA: 11/17/2020 LOS: 4    Brief Narrative / Hospital Course to Date:   Pt admitted with rectal bleeding in the setting of supratherapeutic INR and recent fecal manual disimpaction.  Patient was also having severely painful rectal vs bladder spasms that have been difficult to get controlled.  This did not improve with Foley removal and medications for bladder spasms.  On imaging patient still moderate amount of stool impaction in the colon.   With aggressive bowel regimen and relief of impaction, the spasms resolved.   Assessment & Plan   Principal Problem:   Rectal bleed Active Problems:   Protein C deficiency (HCC)   PULMONARY EMBOLISM, HX OF   Long term (current) use of anticoagulants   Hypertriglyceridemia   Rectal bleeding   Pressure injury of skin   Rectal bleeding /hematochezia-resolved Secondary to supratherapeutic INR and recent fecal disimpaction, suspect due to minor trauma.   No bleeding reported since admission.   Hemoglobin stable.  --Monitor hemoglobin and transfuse if less than 7  Supratherapeutic INR -presented with INR 4.8 in setting of rectal bleeding.  Given oral vitamin K in the ED on admission and again on 10/8. 10/10: INR down to 1.6, subtherapeutic --Daily INR --Continue warfarin per pharmacy dosing  Rectal spasms -initially felt these were bladder spasms.  Foley was removed and B&O suppository started by urology, Bentyl trial.   Eventually resolved after aggressive bowel regimen and relief of constipation. --Urology and GI consulted, see their recommendations --Surgery consulted and recommended topical diltiazem, topical lidocaine, considering fissure as the source however pain was up higher than the anal sphincter.   --Repeated CT pelvis on 10/9 which just showed circumferential mucosal thickening of the distal rectum with formed stool in the proximal  rectum measuring up to 7 cm cross-section. --Avoid narcotics  --Bowel regimen to prevent constipation/impaction --Patient advised to stay well-hydrated  Urinary retention  -patient follows with Dr. Jeffie Pollock. --Continue pain control per orders --Resumed home Gemtesa (patient's own med) --Continue in/out cath's as needed, may replace Foley --Outpatient urology follow-up  Left lower extremity wound s/p recent excision of skin cancer - WOC consulted.  See their wound care instructions.  Image in chart taken 11/20/2020.  History of PE History of protein C deficiency Chronically anticoagulated with warfarin. -- Warfarin resumed, dosing per pharmacy  Hypertriglyceridemia -not on home medication  Hypothyroidism -continue levothyroxine  BPH -continue tamsulosin and finasteride  Hx of metastatic thyroid cancer, skin cancer - follows with oncology, dermatology  Patient BMI: Body mass index is 25.53 kg/m.   DVT prophylaxis: SCDs Start: 11/18/20 0113   Diet:  Diet Orders (From admission, onward)     Start     Ordered   11/21/20 0842  DIET SOFT Room service appropriate? Yes; Fluid consistency: Thin  Diet effective now       Question Answer Comment  Room service appropriate? Yes   Fluid consistency: Thin      11/21/20 0841              Code Status: Full Code   Subjective 11/22/20    Patient had multiple bowel movements after aggressive bowel regimen including enemas.  His spasms have subsided, he is understandably relieved.  The wound on his left leg is a little sore.  He denies any bleeding or other acute complaints today.   Disposition Plan & Communication  Status is: Inpatient  Remains inpatient appropriate because: INR remains supratherapeutic, patient requires further monitoring due to rectal bleeding  Dispo: The patient is from: Home              Anticipated d/c is to: Home              Patient currently is not medically stable to d/c.   Difficult to place patient  No    Family Communication: daughters at bedside on rounds today 10/10.   Consults, Procedures, Significant Events   Consultants:  Urology Gastroenterology  General surgery   Procedures:  None  Antimicrobials:  Anti-infectives (From admission, onward)    None         Micro    Objective   Vitals:   11/21/20 0556 11/21/20 1807 11/21/20 2033 11/22/20 0441  BP: (!) 152/78 (!) 142/63 133/73 (!) 145/89  Pulse: 65 71 87 66  Resp: 18 18 20 20   Temp: 98 F (36.7 C) 97.9 F (36.6 C) 98 F (36.7 C) 97.7 F (36.5 C)  TempSrc: Oral Oral Oral Oral  SpO2: 94% 97% 96% 95%  Weight:      Height:        Intake/Output Summary (Last 24 hours) at 11/22/2020 2016 Last data filed at 11/22/2020 0500 Gross per 24 hour  Intake --  Output 1050 ml  Net -1050 ml   Filed Weights   11/17/20 1910  Weight: 93.9 kg    Physical Exam:  General exam: awake, alert, no acute distress  Respiratory system: normal respiratory effort, on room air Cardiovascular system: normal S1/S2, RRR Gastrointestinal system: Abdomen soft and nontender, +bowel sounds. Central nervous system: A&O x3.  Normal speech, grossly nonfocal exam Extremities: Distal left lower extremity wound image from 10/8 below       Labs   Data Reviewed: I have personally reviewed following labs and imaging studies  CBC: Recent Labs  Lab 11/15/20 2050 11/17/20 2115 11/18/20 0145 11/18/20 0808 11/18/20 1653 11/18/20 1832 11/20/20 0356 11/21/20 0352 11/22/20 0403  WBC 7.8 7.8 6.4 6.0 5.6 7.2  --   --   --   NEUTROABS 6.5 5.7  --   --   --   --   --   --   --   HGB 12.4* 12.6* 11.2* 11.6* 11.7* 12.1* 11.8* 11.3* 11.9*  HCT 39.7 40.1 35.0* 37.4* 37.5* 39.1 36.9* 36.0* 36.6*  MCV 89.4 89.9 90.0 92.1 90.1 90.9  --   --   --   PLT 199 198 181 174 196 193  --   --   --    Basic Metabolic Panel: Recent Labs  Lab 11/15/20 2050 11/17/20 2115 11/18/20 0149 11/19/20 0554  NA 137 133* 139 136  K 4.2 4.1 3.8  4.5  CL 100 100 107 103  CO2 28 28 25 28   GLUCOSE 111* 96 85 101*  BUN 17 17 13 17   CREATININE 1.14 1.16 1.05 1.11  CALCIUM 9.2 9.0 8.4* 8.8*   GFR: Estimated Creatinine Clearance: 55.8 mL/min (by C-G formula based on SCr of 1.11 mg/dL). Liver Function Tests: Recent Labs  Lab 11/15/20 2050 11/17/20 2115 11/18/20 0149  AST 22 29 23   ALT 16 17 15   ALKPHOS 97 109 90  BILITOT 0.8 1.2 0.9  PROT 7.4 7.3 6.2*  ALBUMIN 4.1 3.8 3.1*   Recent Labs  Lab 11/15/20 2050  LIPASE 14   No results for input(s): AMMONIA in the last 168 hours. Coagulation Profile: Recent Labs  Lab 11/18/20 0900 11/19/20 0554 11/20/20 0356 11/21/20 0352 11/22/20 0403  INR 4.7* 4.2* 4.6* 2.1* 1.6*   Cardiac Enzymes: No results for input(s): CKTOTAL, CKMB, CKMBINDEX, TROPONINI in the last 168 hours. BNP (last 3 results) No results for input(s): PROBNP in the last 8760 hours. HbA1C: No results for input(s): HGBA1C in the last 72 hours. CBG: Recent Labs  Lab 11/17/20 1909  GLUCAP 83   Lipid Profile: No results for input(s): CHOL, HDL, LDLCALC, TRIG, CHOLHDL, LDLDIRECT in the last 72 hours. Thyroid Function Tests: No results for input(s): TSH, T4TOTAL, FREET4, T3FREE, THYROIDAB in the last 72 hours. Anemia Panel: No results for input(s): VITAMINB12, FOLATE, FERRITIN, TIBC, IRON, RETICCTPCT in the last 72 hours. Sepsis Labs: No results for input(s): PROCALCITON, LATICACIDVEN in the last 168 hours.  Recent Results (from the past 240 hour(s))  Resp Panel by RT-PCR (Flu A&B, Covid) Nasopharyngeal Swab     Status: None   Collection Time: 11/18/20 12:02 AM   Specimen: Nasopharyngeal Swab; Nasopharyngeal(NP) swabs in vial transport medium  Result Value Ref Range Status   SARS Coronavirus 2 by RT PCR NEGATIVE NEGATIVE Final    Comment: (NOTE) SARS-CoV-2 target nucleic acids are NOT DETECTED.  The SARS-CoV-2 RNA is generally detectable in upper respiratory specimens during the acute phase of  infection. The lowest concentration of SARS-CoV-2 viral copies this assay can detect is 138 copies/mL. A negative result does not preclude SARS-Cov-2 infection and should not be used as the sole basis for treatment or other patient management decisions. A negative result may occur with  improper specimen collection/handling, submission of specimen other than nasopharyngeal swab, presence of viral mutation(s) within the areas targeted by this assay, and inadequate number of viral copies(<138 copies/mL). A negative result must be combined with clinical observations, patient history, and epidemiological information. The expected result is Negative.  Fact Sheet for Patients:  EntrepreneurPulse.com.au  Fact Sheet for Healthcare Providers:  IncredibleEmployment.be  This test is no t yet approved or cleared by the [place] FDA and  has been authorized for detection and/or diagnosis of SARS-CoV-2 by FDA under an Emergency Use Authorization (EUA). This EUA will remain  in effect (meaning this test can be used) for the duration of the COVID-19 declaration under Section 564(b)(1) of the Act, 21 U.S.C.section 360bbb-3(b)(1), unless the authorization is terminated  or revoked sooner.       Influenza A by PCR NEGATIVE NEGATIVE Final   Influenza B by PCR NEGATIVE NEGATIVE Final    Comment: (NOTE) The Xpert Xpress SARS-CoV-2/FLU/RSV plus assay is intended as an aid in the diagnosis of influenza from Nasopharyngeal swab specimens and should not be used as a sole basis for treatment. Nasal washings and aspirates are unacceptable for Xpert Xpress SARS-CoV-2/FLU/RSV testing.  Fact Sheet for Patients: EntrepreneurPulse.com.au  Fact Sheet for Healthcare Providers: IncredibleEmployment.be  This test is not yet approved or cleared by the [place] FDA and has been authorized for detection and/or diagnosis of SARS-CoV-2  by FDA under an Emergency Use Authorization (EUA). This EUA will remain in effect (meaning this test can be used) for the duration of the COVID-19 declaration under Section 564(b)(1) of the Act, 21 U.S.C. section 360bbb-3(b)(1), unless the authorization is terminated or revoked.  Performed at [facility], [address] [place], [place] [address]       Imaging Studies   CT PELVIS W CONTRAST  Result Date: 11/21/2020 CLINICAL DATA:  Severe persistent rectal pain. EXAM: CT PELVIS WITH CONTRAST TECHNIQUE:  Multidetector CT imaging of the pelvis was performed using the standard protocol following the bolus administration of intravenous contrast. CONTRAST:  51mL OMNIPAQUE IOHEXOL 350 MG/ML SOLN COMPARISON:  November 15, 2020 FINDINGS: Urinary Tract: Distended otherwise normal bladder. Multiple renal cysts. Bowel: Mild circumferential mucosal thickening of the distal rectum. Formed stool within the proximal rectum, which measures 7 cm in cross-section. Vascular/Lymphatic: No pathologically enlarged lymph nodes. Tortuosity and calcific atherosclerotic disease of the aorta. Reproductive: Enlargement of the prostate gland, which measures 5.9 cm transversely. Other:  None. Musculoskeletal: Spondylosis of the lumbosacral spine. IMPRESSION: 1. Mild circumferential mucosal thickening of the distal rectum, which may represent proctitis or internal hemorrhoids. Formed stool within the proximal rectum, which measures 7 cm in cross-section. 2. Enlargement of the prostate gland. Please correlate to serum PSA values. 3. Distended otherwise normal urinary bladder. 4. Multiple renal cysts. Aortic Atherosclerosis (ICD10-I70.0). Electronically Signed   By: Fidela Salisbury M.D.   On: 11/21/2020 14:20     Medications   Scheduled Meds:  bisacodyl  5 mg Oral Daily   Chlorhexidine Gluconate Cloth  6 each Topical Daily   dicyclomine  20 mg Oral TID AC & HS   [START ON 11/23/2020] diltiazem    Topical TID   feeding supplement  237 mL Oral TID BM   finasteride  5 mg Oral Daily   hydrocortisone-pramoxine   Rectal QID   levothyroxine  175 mcg Oral QAC breakfast   lidocaine   Topical Q6H   polyethylene glycol  17 g Oral BID   senna-docusate  1 tablet Oral BID   sorbitol, milk of mag, mineral oil, glycerin (SMOG) enema  300 mL Rectal BID   tamsulosin  0.4 mg Oral QPC supper   Warfarin - Pharmacist Dosing Inpatient   Does not apply q1600   Continuous Infusions:     LOS: 4 days    Time spent: 30 minutes with > 50% spent at bedside and in coordination of care     Ezekiel Slocumb, DO [facility]  11/22/2020, 8:16 PM      If 7PM-7AM, please contact night-coverage. How to contact the [facility] Attending or Consulting provider [place] or covering provider during after hours [place], for this patient?    Check the care team in [facility] and look for a) attending/consulting [facility] provider listed and b) the [facility] team listed Log into [email or web address] and use [place]'s universal password to access. If you do not have the password, please contact the hospital operator. Locate the [facility] provider you are looking for under [facility] and page to a number that you can be directly reached. If you still have difficulty reaching the provider, please page the [facility] (Director on Call) for the Hospitalists listed on amion for assistance.

## 2020-11-22 NOTE — Progress Notes (Signed)
Initial Nutrition Assessment  DOCUMENTATION CODES:  Not applicable  INTERVENTION:  Continue current diet as ordered, encourage PO intake Magic cup TID with meals, each supplement provides 290 kcal and 9 grams of protein Provided education on the importance of increasing daily fluid intake.  Request new measured weight  NUTRITION DIAGNOSIS:  Inadequate oral intake related to decreased appetite as evidenced by per patient/family report.  GOAL:  Patient will meet greater than or equal to 90% of their needs  MONITOR:  PO intake, Supplement acceptance  REASON FOR ASSESSMENT:  Consult Assessment of nutrition requirement/status  ASSESSMENT:  87 y.o. male with hx of GERD, HLD, melanoma, recurrent UTIs with foley catheter in place, and metastatic thyroid cancer presented to ED from home with fresh blood noted in his stools.  Noted: pt receiving radioactive iodine for thyroid cancer. Has known mets to the lungs.  Pt on chronic anticoagulation due to protein C deficiency and INR on found to be supratherapeutic when in ED 10/3. Pt had significant stool burden during ED visit and required disimpaction. Rectal bleed seen this presentation attributed to minor trauma paired with abnormal INR.  Called patient's room to discuss recent nutrition status and appetite during admission. Pt resting today, but able to talk with pt's daughter and obtain a nutrition hx. Reports that pt does not eat very much at his baseline, but appetite has been very poor since admission. Also reports low intake of fluids now and at home. Discussed using a nutrition supplement like ensure during admission to aid in meeting nutrition and fluid goals, pt had one yesterday and declines receiving anymore. Is open to [employer] however. Pt has sad recurrent constipation recently and discussed how dehydration can contribute/worsen this problem. Daughter reports that pt does not like water, encouraged any fluids that pt would be  agreeable to (I.e juice, milk, water, sports drinks, etc) as all would contribute to meeting daily fluid needs.   Insignificant weight loss of 1.9% seen over the last month (9/8-10/5)  Nutritionally Relevant Medications: Scheduled Meds:  bisacodyl  5 mg Oral Daily   dicyclomine  20 mg Oral TID AC & HS   feeding supplement  237 mL Oral TID BM   levothyroxine  175 mcg Oral QAC breakfast   polyethylene glycol  17 g Oral BID   senna-docusate  1 tablet Oral BID   sorbitol, milk of mag, mineral oil, glycerin (SMOG) enema  300 mL Rectal BID   warfarin  7.5 mg Oral ONCE-1600   PRN Meds: ondansetron  Labs Reviewed  NUTRITION - FOCUSED PHYSICAL EXAM: Defer to in-person follow-up  Diet Order:   Diet Order             DIET SOFT Room service appropriate? Yes; Fluid consistency: Thin  Diet effective now                   EDUCATION NEEDS:  Education needs have been addressed  Skin:  Skin Assessment: Skin Integrity Issues: Skin Integrity Issues:: Stage I, Incisions Stage I: sacrum Incisions: left ankle  Last BM:  10/10 - type 2  Height:  Ht Readings from Last 1 Encounters:  11/17/20 6' 3.5" (1.918 m)   Weight:  Wt Readings from Last 1 Encounters:  11/17/20 93.9 kg    Ideal Body Weight:  89.1 kg  BMI:  Body mass index is 25.53 kg/m.  Estimated Nutritional Needs:  Kcal:  2000-2200 kcal/d Protein:  100-110 g/d Fluid:  >2.2L/d   Ranell Patrick, RD, LDN  Clinical Dietitian RD pager # available in [place]  After hours/weekend pager # available in [facility]

## 2020-11-23 ENCOUNTER — Inpatient Hospital Stay (HOSPITAL_COMMUNITY): Payer: Medicare Other

## 2020-11-23 DIAGNOSIS — R935 Abnormal findings on diagnostic imaging of other abdominal regions, including retroperitoneum: Secondary | ICD-10-CM

## 2020-11-23 DIAGNOSIS — K625 Hemorrhage of anus and rectum: Secondary | ICD-10-CM | POA: Diagnosis not present

## 2020-11-23 LAB — BASIC METABOLIC PANEL
Anion gap: 6 (ref 5–15)
BUN: 15 mg/dL (ref 8–23)
CO2: 31 mmol/L (ref 22–32)
Calcium: 8.4 mg/dL — ABNORMAL LOW (ref 8.9–10.3)
Chloride: 100 mmol/L (ref 98–111)
Creatinine, Ser: 1.17 mg/dL (ref 0.61–1.24)
GFR, Estimated: 60 mL/min — ABNORMAL LOW (ref >60)
Glucose, Bld: 99 mg/dL (ref 70–99)
Potassium: 4.1 mmol/L (ref 3.5–5.1)
Sodium: 137 mmol/L (ref 135–145)

## 2020-11-23 LAB — TSH: TSH: 15.168 u[IU]/mL — ABNORMAL HIGH (ref 0.350–4.500)

## 2020-11-23 LAB — CBC
HCT: 37 % — ABNORMAL LOW (ref 39.0–52.0)
Hemoglobin: 11.8 g/dL — ABNORMAL LOW (ref 13.0–17.0)
MCH: 28.6 pg (ref 26.0–34.0)
MCHC: 31.9 g/dL (ref 30.0–36.0)
MCV: 89.8 fL (ref 80.0–100.0)
Platelets: 208 10*3/uL (ref 150–400)
RBC: 4.12 MIL/uL — ABNORMAL LOW (ref 4.22–5.81)
RDW: 13.4 % (ref 11.5–15.5)
WBC: 4.9 10*3/uL (ref 4.0–10.5)
nRBC: 0 % (ref 0.0–0.2)

## 2020-11-23 LAB — T4, FREE: Free T4: 1.25 ng/dL — ABNORMAL HIGH (ref 0.61–1.12)

## 2020-11-23 LAB — PROTIME-INR
INR: 1.8 — ABNORMAL HIGH (ref 0.8–1.2)
Prothrombin Time: 20.8 seconds — ABNORMAL HIGH (ref 11.4–15.2)

## 2020-11-23 MED ORDER — WARFARIN SODIUM 5 MG PO TABS
7.5000 mg | ORAL_TABLET | Freq: Once | ORAL | Status: AC
  Administered 2020-11-23: 7.5 mg via ORAL
  Filled 2020-11-23: qty 1

## 2020-11-23 MED ORDER — POLYETHYLENE GLYCOL 3350 17 G PO PACK
17.0000 g | PACK | Freq: Two times a day (BID) | ORAL | Status: DC
  Administered 2020-11-23 (×2): 17 g via ORAL
  Filled 2020-11-23 (×3): qty 1

## 2020-11-23 NOTE — Progress Notes (Signed)
ANTICOAGULATION CONSULT NOTE -   Consult  Pharmacy Consult for Warfarin Indication: h/o PE and protein C deficiency  No Known Allergies  Patient Measurements: Height: 6' 3.5" (191.8 cm) Weight: 93.9 kg (207 lb) IBW/kg (Calculated) : 85.65  Vital Signs: Temp: 98.2 F (36.8 C) (10/11 0530) Temp Source: Oral (10/11 0530) BP: 130/62 (10/11 0530) Pulse Rate: 68 (10/11 0530)  Labs: Recent Labs    11/21/20 0352 11/22/20 0403 11/23/20 0334  HGB 11.3* 11.9* 11.8*  HCT 36.0* 36.6* 37.0*  PLT  --   --  208  LABPROT 23.4* 19.2* 20.8*  INR 2.1* 1.6* 1.8*  CREATININE  --   --  1.17     Estimated Creatinine Clearance: 52.9 mL/min (by C-G formula based on SCr of 1.17 mg/dL).   Medical History: Past Medical History:  Diagnosis Date   Arthritis    Cancer ([place]) 2003   melanoma  L ear   GERD (gastroesophageal reflux disease)    Hypothyroidism    post op   Lung mass    Pulmonary embolism ([place]) 2005 & 2007   protein C deficiency    Medications:  Warfarin 5 mg daily except 7.5 mg MWF PTA last dose- Med Mgmt note from 10/5 instructed pt/caregiver to reduce dose of warfarin due to recent Rx for Levaquin- 10/5 was last dose should have been 5 mg per office instructions but unclear if took 5 mg or 7.5 mg   Assessment: 87 y/o M on chronic warfarin for h/o PE and protein C deficiency admitted with rectal bleeding and supra-therapeutic INR. Given 2.5 mg vitamin K po 10/5 and repeated 5 mg po 10/8 with INR now finally in therapeutic range. No significant DDI with current regimen.   Today, 11/23/20 INR 1.8, subtherapeutic but trending up  Hgb 11.8 stable  Meal intake not charted    Goal of Therapy:  INR 2-3   Plan:  Warfarin 7.5 mg once today PT/INR daily Monitor for signs and symptoms of bleeding     Royetta Asal, PharmD, BCPS 11/23/2020 8:11 AM

## 2020-11-23 NOTE — Progress Notes (Signed)
PROGRESS NOTE    Clarence Hopkins   GHW:[id number]  DOB: 03-17-1930  PCP: Cassandria Anger, MD    DOA: 11/17/2020 LOS: 5    Brief Narrative / Hospital Course to Date:   Pt admitted with rectal bleeding in the setting of supratherapeutic INR and recent fecal manual disimpaction.  Patient was also having severely painful rectal vs bladder spasms that have been difficult to get controlled.  This did not improve with Foley removal and medications for bladder spasms.  On imaging patient still moderate amount of stool impaction in the colon.   With aggressive bowel regimen and relief of impaction, the spasms resolved.   Assessment & Plan   Principal Problem:   Rectal bleed Active Problems:   Protein C deficiency (HCC)   PULMONARY EMBOLISM, HX OF   Long term (current) use of anticoagulants   Hypertriglyceridemia   Rectal bleeding   Pressure injury of skin   Rectal bleeding /hematochezia-resolved Secondary to supratherapeutic INR and recent fecal disimpaction, suspect due to minor trauma.   No bleeding reported since admission.   Hemoglobin stable.  --Monitor hemoglobin and transfuse if less than 7  Supratherapeutic INR -presented with INR 4.8 in setting of rectal bleeding.  Given oral vitamin K in the ED on admission and again on 10/8. 10/10: INR down to 1.6, subtherapeutic --Daily INR --Continue warfarin per pharmacy dosing  Rectal spasms -initially felt these were bladder spasms.  Foley was removed and B&O suppository started by urology, Bentyl trial.   Eventually resolved after aggressive bowel regimen and relief of constipation. --Urology and GI consulted, see their recommendations --Surgery consulted and recommended topical diltiazem, topical lidocaine, considering fissure as the source however pain was up higher than the anal sphincter.   --Repeated CT pelvis on 10/9 which just showed circumferential mucosal thickening of the distal rectum with formed stool in the proximal  rectum measuring up to 7 cm cross-section. --Avoid narcotics  --Bowel regimen to prevent constipation/impaction --Patient advised to stay well-hydrated  Urinary retention  -patient follows with Dr. Jeffie Pollock. --Continue pain control per orders --Resumed home Gemtesa (patient's own med) --Continue in/out cath's as needed, may replace Foley --Outpatient urology follow-up  Left lower extremity wound s/p recent excision of skin cancer - WOC consulted.  See their wound care instructions.  Image in chart taken 11/20/2020.  History of PE History of protein C deficiency Chronically anticoagulated with warfarin. -- Warfarin resumed, dosing per pharmacy  Hypertriglyceridemia -not on home medication  Hypothyroidism -continue levothyroxine  BPH -continue tamsulosin and finasteride  Hx of metastatic thyroid cancer, skin cancer - follows with oncology, dermatology  Patient BMI: Body mass index is 25.53 kg/m.   DVT prophylaxis: SCDs Start: 11/18/20 0113   Diet:  Diet Orders (From admission, onward)     Start     Ordered   11/21/20 0842  DIET SOFT Room service appropriate? Yes; Fluid consistency: Thin  Diet effective now       Question Answer Comment  Room service appropriate? Yes   Fluid consistency: Thin      11/21/20 0841              Code Status: Full Code   Subjective 11/23/20    Patient had multiple bowel movements after aggressive bowel regimen including enemas.  His spasms have subsided, he is understandably relieved.  The wound on his left leg is a little sore.  He denies any bleeding or other acute complaints today.   Disposition Plan & Communication  Status is: Inpatient  Remains inpatient appropriate because: INR now subtherapeutic.  Anticipate DC home once INR therapeutic, likely 24 to 48 hours   Dispo: The patient is from: Home              Anticipated d/c is to: Home              Patient currently is not medically stable to d/c.   Difficult to place patient  No    Family Communication: daughters at bedside on rounds today 10/10.   Consults, Procedures, Significant Events   Consultants:  Urology Gastroenterology  General surgery   Procedures:  None  Antimicrobials:  Anti-infectives (From admission, onward)    None         Micro    Objective   Vitals:   11/21/20 2033 11/22/20 0441 11/22/20 2059 11/23/20 0530  BP: 133/73 (!) 145/89 114/72 130/62  Pulse: 87 66 60 68  Resp: 20 20 20 18   Temp: 98 F (36.7 C) 97.7 F (36.5 C) 98.3 F (36.8 C) 98.2 F (36.8 C)  TempSrc: Oral Oral Oral Oral  SpO2: 96% 95% 93% 95%  Weight:      Height:        Intake/Output Summary (Last 24 hours) at 11/23/2020 0734 Last data filed at 11/23/2020 0134 Gross per 24 hour  Intake 180 ml  Output 200 ml  Net -20 ml    Filed Weights   11/17/20 1910  Weight: 93.9 kg    Physical Exam:  General exam: awake, alert, no acute distress  Respiratory system: normal respiratory effort, on room air Cardiovascular system: normal S1/S2, RRR Gastrointestinal system: Abdomen soft and nontender, +bowel sounds. Central nervous system: A&O x3.  Normal speech, grossly nonfocal exam Extremities: Distal left lower extremity wound image from 10/8 below       Labs   Data Reviewed: I have personally reviewed following labs and imaging studies  CBC: Recent Labs  Lab 11/17/20 2115 11/18/20 0145 11/18/20 0808 11/18/20 1653 11/18/20 1832 11/20/20 0356 11/21/20 0352 11/22/20 0403 11/23/20 0334  WBC 7.8 6.4 6.0 5.6 7.2  --   --   --  4.9  NEUTROABS 5.7  --   --   --   --   --   --   --   --   HGB 12.6* 11.2* 11.6* 11.7* 12.1* 11.8* 11.3* 11.9* 11.8*  HCT 40.1 35.0* 37.4* 37.5* 39.1 36.9* 36.0* 36.6* 37.0*  MCV 89.9 90.0 92.1 90.1 90.9  --   --   --  89.8  PLT 198 181 174 196 193  --   --   --  030    Basic Metabolic Panel: Recent Labs  Lab 11/17/20 2115 11/18/20 0149 11/19/20 0554 11/23/20 0334  NA 133* 139 136 137  K 4.1 3.8 4.5  4.1  CL 100 107 103 100  CO2 28 25 28 31   GLUCOSE 96 85 101* 99  BUN 17 13 17 15   CREATININE 1.16 1.05 1.11 1.17  CALCIUM 9.0 8.4* 8.8* 8.4*    GFR: Estimated Creatinine Clearance: 52.9 mL/min (by C-G formula based on SCr of 1.17 mg/dL). Liver Function Tests: Recent Labs  Lab 11/17/20 2115 11/18/20 0149  AST 29 23  ALT 17 15  ALKPHOS 109 90  BILITOT 1.2 0.9  PROT 7.3 6.2*  ALBUMIN 3.8 3.1*    No results for input(s): LIPASE, AMYLASE in the last 168 hours.  No results for input(s): AMMONIA in the last 168 hours. Coagulation  Profile: Recent Labs  Lab 11/19/20 0554 11/20/20 0356 11/21/20 0352 11/22/20 0403 11/23/20 0334  INR 4.2* 4.6* 2.1* 1.6* 1.8*    Cardiac Enzymes: No results for input(s): CKTOTAL, CKMB, CKMBINDEX, TROPONINI in the last 168 hours. BNP (last 3 results) No results for input(s): PROBNP in the last 8760 hours. HbA1C: No results for input(s): HGBA1C in the last 72 hours. CBG: Recent Labs  Lab 11/17/20 1909  GLUCAP 83    Lipid Profile: No results for input(s): CHOL, HDL, LDLCALC, TRIG, CHOLHDL, LDLDIRECT in the last 72 hours. Thyroid Function Tests: Recent Labs    11/23/20 0334  TSH 15.168*   Anemia Panel: No results for input(s): VITAMINB12, FOLATE, FERRITIN, TIBC, IRON, RETICCTPCT in the last 72 hours. Sepsis Labs: No results for input(s): PROCALCITON, LATICACIDVEN in the last 168 hours.  Recent Results (from the past 240 hour(s))  Resp Panel by RT-PCR (Flu A&B, Covid) Nasopharyngeal Swab     Status: None   Collection Time: 11/18/20 12:02 AM   Specimen: Nasopharyngeal Swab; Nasopharyngeal(NP) swabs in vial transport medium  Result Value Ref Range Status   SARS Coronavirus 2 by RT PCR NEGATIVE NEGATIVE Final    Comment: (NOTE) SARS-CoV-2 target nucleic acids are NOT DETECTED.  The SARS-CoV-2 RNA is generally detectable in upper respiratory specimens during the acute phase of infection. The lowest concentration of SARS-CoV-2 viral  copies this assay can detect is 138 copies/mL. A negative result does not preclude SARS-Cov-2 infection and should not be used as the sole basis for treatment or other patient management decisions. A negative result may occur with  improper specimen collection/handling, submission of specimen other than nasopharyngeal swab, presence of viral mutation(s) within the areas targeted by this assay, and inadequate number of viral copies(<138 copies/mL). A negative result must be combined with clinical observations, patient history, and epidemiological information. The expected result is Negative.  Fact Sheet for Patients:  EntrepreneurPulse.com.au  Fact Sheet for Healthcare Providers:  IncredibleEmployment.be  This test is no t yet approved or cleared by the [place] FDA and  has been authorized for detection and/or diagnosis of SARS-CoV-2 by FDA under an Emergency Use Authorization (EUA). This EUA will remain  in effect (meaning this test can be used) for the duration of the COVID-19 declaration under Section 564(b)(1) of the Act, 21 U.S.C.section 360bbb-3(b)(1), unless the authorization is terminated  or revoked sooner.       Influenza A by PCR NEGATIVE NEGATIVE Final   Influenza B by PCR NEGATIVE NEGATIVE Final    Comment: (NOTE) The Xpert Xpress SARS-CoV-2/FLU/RSV plus assay is intended as an aid in the diagnosis of influenza from Nasopharyngeal swab specimens and should not be used as a sole basis for treatment. Nasal washings and aspirates are unacceptable for Xpert Xpress SARS-CoV-2/FLU/RSV testing.  Fact Sheet for Patients: EntrepreneurPulse.com.au  Fact Sheet for Healthcare Providers: IncredibleEmployment.be  This test is not yet approved or cleared by the [place] FDA and has been authorized for detection and/or diagnosis of SARS-CoV-2 by FDA under an Emergency Use Authorization (EUA). This  EUA will remain in effect (meaning this test can be used) for the duration of the COVID-19 declaration under Section 564(b)(1) of the Act, 21 U.S.C. section 360bbb-3(b)(1), unless the authorization is terminated or revoked.  Performed at [facility], [place] [address] [place], [place] [address]        Imaging Studies   CT PELVIS W CONTRAST  Result Date: 11/21/2020 CLINICAL DATA:  Severe persistent rectal pain. EXAM:  CT PELVIS WITH CONTRAST TECHNIQUE: Multidetector CT imaging of the pelvis was performed using the standard protocol following the bolus administration of intravenous contrast. CONTRAST:  8mL OMNIPAQUE IOHEXOL 350 MG/ML SOLN COMPARISON:  November 15, 2020 FINDINGS: Urinary Tract: Distended otherwise normal bladder. Multiple renal cysts. Bowel: Mild circumferential mucosal thickening of the distal rectum. Formed stool within the proximal rectum, which measures 7 cm in cross-section. Vascular/Lymphatic: No pathologically enlarged lymph nodes. Tortuosity and calcific atherosclerotic disease of the aorta. Reproductive: Enlargement of the prostate gland, which measures 5.9 cm transversely. Other:  None. Musculoskeletal: Spondylosis of the lumbosacral spine. IMPRESSION: 1. Mild circumferential mucosal thickening of the distal rectum, which may represent proctitis or internal hemorrhoids. Formed stool within the proximal rectum, which measures 7 cm in cross-section. 2. Enlargement of the prostate gland. Please correlate to serum PSA values. 3. Distended otherwise normal urinary bladder. 4. Multiple renal cysts. Aortic Atherosclerosis (ICD10-I70.0). Electronically Signed   By: Fidela Salisbury M.D.   On: 11/21/2020 14:20     Medications   Scheduled Meds:  bisacodyl  5 mg Oral Daily   Chlorhexidine Gluconate Cloth  6 each Topical Daily   dicyclomine  20 mg Oral TID AC & HS   diltiazem   Topical TID   feeding supplement  237 mL Oral TID BM   finasteride  5 mg Oral  Daily   hydrocortisone-pramoxine   Rectal QID   levothyroxine  175 mcg Oral QAC breakfast   lidocaine   Topical Q6H   senna-docusate  1 tablet Oral BID   sorbitol, milk of mag, mineral oil, glycerin (SMOG) enema  300 mL Rectal BID   tamsulosin  0.4 mg Oral QPC supper   Warfarin - Pharmacist Dosing Inpatient   Does not apply q1600   Continuous Infusions:     LOS: 5 days    Time spent: 30 minutes with > 50% spent at bedside and in coordination of care     Ezekiel Slocumb, DO [facility]  11/23/2020, 7:34 AM      If 7PM-7AM, please contact night-coverage. How to contact the [facility] Attending or Consulting provider [place] or covering provider during after hours [place], for this patient?    Check the care team in [facility] and look for a) attending/consulting [facility] provider listed and b) the [facility] team listed Log into [email or web address] and use [facility]'s universal password to access. If you do not have the password, please contact the hospital operator. Locate the [facility] provider you are looking for under [facility] and page to a number that you can be directly reached. If you still have difficulty reaching the provider, please page the [facility] (Director on Call) for the Hospitalists listed on amion for assistance.

## 2020-11-23 NOTE — Progress Notes (Addendum)
PROGRESS NOTE    Clarence Hopkins   ZOX:[id number]  DOB: Jul 08, 1930  PCP: Cassandria Anger, MD    DOA: 11/17/2020 LOS: 5    Brief Narrative / Hospital Course to Date:   Pt admitted with rectal bleeding in the setting of supratherapeutic INR and recent fecal manual disimpaction.  Patient was also having severely painful rectal vs bladder spasms that have been difficult to get controlled.  This did not improve with Foley removal and medications for bladder spasms.  On imaging patient still moderate amount of stool impaction in the colon.   With aggressive bowel regimen and relief of impaction, the spasms resolved.   Assessment & Plan   Principal Problem:   Rectal bleed Active Problems:   Protein C deficiency (HCC)   PULMONARY EMBOLISM, HX OF   Long term (current) use of anticoagulants   Hypertriglyceridemia   Rectal bleeding   Pressure injury of skin   Rectal bleeding /hematochezia-resolved Secondary to supratherapeutic INR and recent fecal disimpaction, suspect due to minor trauma.   No bleeding reported since admission.   Hemoglobin stable.  --Monitor hemoglobin and transfuse if less than 7  Supratherapeutic INR -presented with INR 4.8 in setting of rectal bleeding.  Given oral vitamin K in the ED on admission and again on 10/8. 10/10: INR down to 1.6, subtherapeutic --Daily INR --Continue warfarin per pharmacy dosing  Rectal spasms -initially felt these were bladder spasms.  Foley was removed and B&O suppository started by urology, Bentyl trial.   Eventually resolved after aggressive bowel regimen and relief of constipation. --Urology and GI consulted, see their recommendations --Surgery consulted and recommended topical diltiazem, topical lidocaine, considering fissure as the source however pain was up higher than the anal sphincter.   --Repeated CT pelvis on 10/9 which just showed circumferential mucosal thickening of the distal rectum with formed stool in the proximal  rectum measuring up to 7 cm cross-section. --Avoid narcotics  --Bowel regimen to prevent constipation/impaction --Patient advised to stay well-hydrated  Urinary retention  -patient follows with Dr. Jeffie Pollock. --Continue pain control per orders --Resumed home Gemtesa (patient's own med) --Continue in/out cath's as needed, may replace Foley --Outpatient urology follow-up  Paroxysmal A-fib - new onset, noted on monitor on admission in setting of his acute problems.  Resolved. Already on warfarin.  Heart rates have been controlled.  Hold off beta blocker for now.  Outpatient PCP and/or cardiolgy follow up.  Left lower extremity wound s/p recent excision of skin cancer - WOC consulted.  See their wound care instructions.  Image in chart taken 11/20/2020.  History of PE History of protein C deficiency Chronically anticoagulated with warfarin. -- Warfarin resumed, dosing per pharmacy 10/11: INR 1.8, subtherapeutic  Hypertriglyceridemia -not on home medication  Hypothyroidism -continue levothyroxine. TSH elevated.  Free T4 pending. Per family, pt had not been on levothyroxine for some time, just resumed two weeks ago. --PCP follow up to recheck TSH/T4 in about 4-6 weeks --No change to dose at this time.  BPH -continue tamsulosin and finasteride  Hx of metastatic thyroid cancer, skin cancer - follows with oncology, dermatology  Patient BMI: Body mass index is 25.53 kg/m.   DVT prophylaxis: SCDs Start: 11/18/20 0113   Diet:  Diet Orders (From admission, onward)     Start     Ordered   11/21/20 0842  DIET SOFT Room service appropriate? Yes; Fluid consistency: Thin  Diet effective now       Question Answer Comment  Room service appropriate?  Yes   Fluid consistency: Thin      11/21/20 0841              Code Status: Full Code   Subjective 11/23/20    Patient denies rectal spasms since having multiple BM's.  Feeling well overall and denies acute complaints.    Daughter  reports he'd been off his thyroid medication for awhile and just resumed it about 2 weeks ago.     Disposition Plan & Communication   Status is: Inpatient  Remains inpatient appropriate because: INR now subtherapeutic.  Anticipate DC home once INR therapeutic, likely tomorrow.   Dispo: The patient is from: Home              Anticipated d/c is to: Home              Patient currently is not medically stable to d/c.   Difficult to place patient No    Family Communication: 3 daughters at bedside on rounds today 10/11.   Consults, Procedures, Significant Events   Consultants:  Urology Gastroenterology  General surgery   Procedures:  None  Antimicrobials:  Anti-infectives (From admission, onward)    None         Micro    Objective   Vitals:   11/22/20 2059 11/23/20 0530 11/23/20 1438 11/23/20 1439  BP: 114/72 130/62 (!) 156/88 (!) 156/88  Pulse: 60 68 72 72  Resp: 20 18 18    Temp: 98.3 F (36.8 C) 98.2 F (36.8 C) 98.4 F (36.9 C) (!) 97.4 F (36.3 C)  TempSrc: Oral Oral Oral Oral  SpO2: 93% 95% 97% 97%  Weight:      Height:        Intake/Output Summary (Last 24 hours) at 11/23/2020 1734 Last data filed at 11/23/2020 1321 Gross per 24 hour  Intake 540 ml  Output 200 ml  Net 340 ml   Filed Weights   11/17/20 1910  Weight: 93.9 kg    Physical Exam:  General exam: awake, alert, no acute distress  Respiratory system: normal respiratory effort, on room air, CTAB Cardiovascular system: RRR, normal S1/S2, RRR Gastrointestinal system: Abdomen soft and nontender, +bowel sounds. Central nervous system: A&O x3.  Normal speech, grossly nonfocal exam Extremities: Distal left lower extremity wound with clean, dry intact gauze dressing   Labs   Data Reviewed: I have personally reviewed following labs and imaging studies  CBC: Recent Labs  Lab 11/17/20 2115 11/18/20 0145 11/18/20 0808 11/18/20 1653 11/18/20 1832 11/20/20 0356 11/21/20 0352  11/22/20 0403 11/23/20 0334  WBC 7.8 6.4 6.0 5.6 7.2  --   --   --  4.9  NEUTROABS 5.7  --   --   --   --   --   --   --   --   HGB 12.6* 11.2* 11.6* 11.7* 12.1* 11.8* 11.3* 11.9* 11.8*  HCT 40.1 35.0* 37.4* 37.5* 39.1 36.9* 36.0* 36.6* 37.0*  MCV 89.9 90.0 92.1 90.1 90.9  --   --   --  89.8  PLT 198 181 174 196 193  --   --   --  629   Basic Metabolic Panel: Recent Labs  Lab 11/17/20 2115 11/18/20 0149 11/19/20 0554 11/23/20 0334  NA 133* 139 136 137  K 4.1 3.8 4.5 4.1  CL 100 107 103 100  CO2 28 25 28 31   GLUCOSE 96 85 101* 99  BUN 17 13 17 15   CREATININE 1.16 1.05 1.11 1.17  CALCIUM 9.0  8.4* 8.8* 8.4*   GFR: Estimated Creatinine Clearance: 52.9 mL/min (by C-G formula based on SCr of 1.17 mg/dL). Liver Function Tests: Recent Labs  Lab 11/17/20 2115 11/18/20 0149  AST 29 23  ALT 17 15  ALKPHOS 109 90  BILITOT 1.2 0.9  PROT 7.3 6.2*  ALBUMIN 3.8 3.1*   No results for input(s): LIPASE, AMYLASE in the last 168 hours.  No results for input(s): AMMONIA in the last 168 hours. Coagulation Profile: Recent Labs  Lab 11/19/20 0554 11/20/20 0356 11/21/20 0352 11/22/20 0403 11/23/20 0334  INR 4.2* 4.6* 2.1* 1.6* 1.8*   Cardiac Enzymes: No results for input(s): CKTOTAL, CKMB, CKMBINDEX, TROPONINI in the last 168 hours. BNP (last 3 results) No results for input(s): PROBNP in the last 8760 hours. HbA1C: No results for input(s): HGBA1C in the last 72 hours. CBG: Recent Labs  Lab 11/17/20 1909  GLUCAP 83   Lipid Profile: No results for input(s): CHOL, HDL, LDLCALC, TRIG, CHOLHDL, LDLDIRECT in the last 72 hours. Thyroid Function Tests: Recent Labs    11/23/20 0334  TSH 15.168*  FREET4 1.25*   Anemia Panel: No results for input(s): VITAMINB12, FOLATE, FERRITIN, TIBC, IRON, RETICCTPCT in the last 72 hours. Sepsis Labs: No results for input(s): PROCALCITON, LATICACIDVEN in the last 168 hours.  Recent Results (from the past 240 hour(s))  Resp Panel by RT-PCR  (Flu A&B, Covid) Nasopharyngeal Swab     Status: None   Collection Time: 11/18/20 12:02 AM   Specimen: Nasopharyngeal Swab; Nasopharyngeal(NP) swabs in vial transport medium  Result Value Ref Range Status   SARS Coronavirus 2 by RT PCR NEGATIVE NEGATIVE Final    Comment: (NOTE) SARS-CoV-2 target nucleic acids are NOT DETECTED.  The SARS-CoV-2 RNA is generally detectable in upper respiratory specimens during the acute phase of infection. The lowest concentration of SARS-CoV-2 viral copies this assay can detect is 138 copies/mL. A negative result does not preclude SARS-Cov-2 infection and should not be used as the sole basis for treatment or other patient management decisions. A negative result may occur with  improper specimen collection/handling, submission of specimen other than nasopharyngeal swab, presence of viral mutation(s) within the areas targeted by this assay, and inadequate number of viral copies(<138 copies/mL). A negative result must be combined with clinical observations, patient history, and epidemiological information. The expected result is Negative.  Fact Sheet for Patients:  EntrepreneurPulse.com.au  Fact Sheet for Healthcare Providers:  IncredibleEmployment.be  This test is no t yet approved or cleared by the [place] FDA and  has been authorized for detection and/or diagnosis of SARS-CoV-2 by FDA under an Emergency Use Authorization (EUA). This EUA will remain  in effect (meaning this test can be used) for the duration of the COVID-19 declaration under Section 564(b)(1) of the Act, 21 U.S.C.section 360bbb-3(b)(1), unless the authorization is terminated  or revoked sooner.       Influenza A by PCR NEGATIVE NEGATIVE Final   Influenza B by PCR NEGATIVE NEGATIVE Final    Comment: (NOTE) The Xpert Xpress SARS-CoV-2/FLU/RSV plus assay is intended as an aid in the diagnosis of influenza from Nasopharyngeal swab specimens  and should not be used as a sole basis for treatment. Nasal washings and aspirates are unacceptable for Xpert Xpress SARS-CoV-2/FLU/RSV testing.  Fact Sheet for Patients: EntrepreneurPulse.com.au  Fact Sheet for Healthcare Providers: IncredibleEmployment.be  This test is not yet approved or cleared by the [place] FDA and has been authorized for detection and/or diagnosis of SARS-CoV-2 by FDA under an  Emergency Use Authorization (EUA). This EUA will remain in effect (meaning this test can be used) for the duration of the COVID-19 declaration under Section 564(b)(1) of the Act, 21 U.S.C. section 360bbb-3(b)(1), unless the authorization is terminated or revoked.  Performed at [facility], [place] [address] [place], [place] [address]       Imaging Studies   DG Abd 1 View  Result Date: 11/23/2020 CLINICAL DATA:  Assess stool burden. EXAM: ABDOMEN - 1 VIEW COMPARISON:  11/19/2020 FINDINGS: Mild residual stool identified within the proximal right colon. Compared with the previous exam there is been interval resolution of large impacted stool ball within the rectum. IMPRESSION: Interval resolution of large impacted stool ball within the rectum. Mild stool burden noted within the proximal right colon. Electronically Signed   By: Kerby Moors M.D.   On: 11/23/2020 14:47     Medications   Scheduled Meds:  bisacodyl  5 mg Oral Daily   Chlorhexidine Gluconate Cloth  6 each Topical Daily   diltiazem   Topical TID   feeding supplement  237 mL Oral TID BM   finasteride  5 mg Oral Daily   hydrocortisone-pramoxine   Rectal QID   levothyroxine  175 mcg Oral QAC breakfast   lidocaine   Topical Q6H   polyethylene glycol  17 g Oral BID   senna-docusate  1 tablet Oral BID   sorbitol, milk of mag, mineral oil, glycerin (SMOG) enema  300 mL Rectal BID   tamsulosin  0.4 mg Oral QPC supper   Warfarin - Pharmacist Dosing Inpatient    Does not apply q1600   Continuous Infusions:     LOS: 5 days    Time spent: 25 minutes with > 50% spent at bedside and in coordination of care     Ezekiel Slocumb, DO [facility]  11/23/2020, 5:34 PM      If 7PM-7AM, please contact night-coverage. How to contact the [facility] Attending or Consulting provider [place] or covering provider during after hours [place], for this patient?    Check the care team in [facility] and look for a) attending/consulting TRH provider listed and b) the [facility] team listed Log into [email or web address] and use [place]'s universal password to access. If you do not have the password, please contact the hospital operator. Locate the [facility] provider you are looking for under [facility] and page to a number that you can be directly reached. If you still have difficulty reaching the provider, please page the [facility] (Director on Call) for the Hospitalists listed on amion for assistance.

## 2020-11-23 NOTE — Evaluation (Signed)
Occupational Therapy Evaluation Patient Details Name: Clarence Hopkins MRN: [id number] DOB: January 18, 1931 Today's Date: 11/23/2020   History of Present Illness Clarence Hopkins is a 87 y.o. male who presented to the ER with rectal bleed. Ot note, pt recently in ED for abdominal pain and constipation, was disimpacted. PMH: PE secondary to protein C deficiency on chronic warfarin, hyperlipidemia, multifocal pneumonia, melanoma, malignant thyroid cancer with previous lung mass, cervical fusion   Clinical Impression   Patient was living at [place] with wife prior level with independence in ADLs. Patient currently needs min guard with consistent safety cues with RW to participate in all ADL tasks. Patient needed min A for LB dressing tasks with increased time. Patients daughter was in room and educated on cues to provide for safety. Patient would continue to benefit from skilled OT services at this time while admitted and after d/c to address noted deficits in order to improve overall safety and independence in ADLs.        Recommendations for follow up therapy are one component of a multi-disciplinary discharge planning process, led by the attending physician.  Recommendations may be updated based on patient status, additional functional criteria and insurance authorization.   Follow Up Recommendations  Supervision/Assistance - 24 hour;Home health OT    Equipment Recommendations  None recommended by OT    Recommendations for Other Services       Precautions / Restrictions Precautions Precautions: Fall Restrictions Weight Bearing Restrictions: No      Mobility Bed Mobility               General bed mobility comments: patient was in recliner at start of session    Transfers Overall transfer level: Needs assistance Equipment used: Rolling walker (2 wheeled) Transfers: Sit to/from Stand Sit to Stand: Min guard         General transfer comment: with increased time and education on proper  hand placement    Balance Overall balance assessment: Needs assistance Sitting-balance support: Feet supported Sitting balance-Leahy Scale: Good     Standing balance support: During functional activity;Single extremity supported Standing balance-Leahy Scale: Fair                             ADL either performed or assessed with clinical judgement   ADL Overall ADL's : Needs assistance/impaired Eating/Feeding: Modified independent;Sitting   Grooming: Wash/dry face;Oral care;Standing;Min guard;Cueing for safety Grooming Details (indicate cue type and reason): at sink Upper Body Bathing: Min guard;Sitting;Cueing for sequencing   Lower Body Bathing: Min guard;Sitting/lateral leans   Upper Body Dressing : Min guard;Sitting   Lower Body Dressing: Minimal assistance;Sitting/lateral leans Lower Body Dressing Details (indicate cue type and reason): patient required min A to get sock unstuck from toe with increased time. Toilet Transfer: Nature conservation officer;Ambulation;RW Toilet Transfer Details (indicate cue type and reason): patient needed continued education for proper hand anf foot placement for safety with transfers. patients daughter was present and educated on cues to provide. patients daughter verbalized understanding. Toileting- Water quality scientist and Hygiene: Min guard;Sit to/from stand;Cueing for safety       Functional mobility during ADLs: Min guard;Cueing for safety;Rolling walker General ADL Comments: patient was able to participate in functional mobiltiy in hallway with increased safety cues to keep RW close to patietn and on ground for turning. patient was able to complete functional mobility for over 50 feet with patient remaining standing to complete shaving, oral care and combing hair  at sink afterwards.     Vision Patient Visual Report: No change from baseline       Perception     Praxis      Pertinent Vitals/Pain Pain Assessment: No/denies  pain Faces Pain Scale: No hurt     Hand Dominance Right   Extremity/Trunk Assessment Upper Extremity Assessment Upper Extremity Assessment: Overall WFL for tasks assessed   Lower Extremity Assessment Lower Extremity Assessment: Defer to PT evaluation   Cervical / Trunk Assessment Cervical / Trunk Assessment: Normal   Communication Communication Communication: No difficulties   Cognition Arousal/Alertness: Awake/alert Behavior During Therapy: WFL for tasks assessed/performed Overall Cognitive Status: Within Functional Limits for tasks assessed                                 General Comments: patient noted to have some mild confusion. daughter in room reported it was better than previous few days. patient continues to have poor safety awareness with transitions.   General Comments       Exercises     Shoulder Instructions      Home Living Family/patient expects to be discharged to:: Assisted living Living Arrangements: Spouse/significant other Available Help at Discharge: Family;Available PRN/intermittently Type of Home: House Home Access: Level entry     Home Layout: One level     Bathroom Shower/Tub: Walk-in shower         Home Equipment: Cane - single point;Walker - 2 wheels          Prior Functioning/Environment Level of Independence: Independent        Comments: Pt and spouse report they recently moved to [facility], pt ambualtes arm in arm with spouse, independent with bathing, toileting and dressing. Facility provides meals and household cleaning.        OT Problem List: Decreased strength;Impaired balance (sitting and/or standing);Decreased activity tolerance;Decreased safety awareness;Decreased knowledge of use of DME or AE      OT Treatment/Interventions: Self-care/ADL training;DME and/or AE instruction;Therapeutic activities;Balance training;Patient/family education    OT Goals(Current goals can be found in the care plan  section) Acute Rehab OT Goals Patient Stated Goal: to go home with wife OT Goal Formulation: With patient Time For Goal Achievement: 12/07/20 Potential to Achieve Goals: Good  OT Frequency: Min 2X/week   Barriers to D/C:    family is unable to offer physical assist at home       Co-evaluation              AM-PAC OT "6 Clicks" Daily Activity     Outcome Measure Help from another person eating meals?: None Help from another person taking care of personal grooming?: A Little Help from another person toileting, which includes using toliet, bedpan, or urinal?: A Little Help from another person bathing (including washing, rinsing, drying)?: A Little Help from another person to put on and taking off regular upper body clothing?: None Help from another person to put on and taking off regular lower body clothing?: A Little 6 Click Score: 20   End of Session Equipment Utilized During Treatment: Gait belt;Rolling walker Nurse Communication: Mobility status  Activity Tolerance: Patient tolerated treatment well Patient left: in chair;with call bell/phone within reach;with family/visitor present  OT Visit Diagnosis: Unsteadiness on feet (R26.81);Muscle weakness (generalized) (M62.81)                Time: 8756-4332 OT Time Calculation (min): 49 min Charges:  OT General  Charges $OT Visit: 1 Visit OT Evaluation $OT Eval Low Complexity: 1 Low OT Treatments $Self Care/Home Management : 23-37 mins  Jackelyn Poling OTR/L, MS Acute Rehabilitation Department Office# [phone] Pager# [phone]   [place] 11/23/2020, 10:58 AM

## 2020-11-23 NOTE — Progress Notes (Addendum)
Patient ID: Clarence Hopkins, male   DOB: February 13, 1931, 87 y.o.   MRN: [id number]    Progress Note   Subjective   Day # 6  CC; rectal bleeding / fecal impaction/ rectal pain  Pelvic CT/11/21/2020-mild circumferential mucosal thickening of the distal rectum possibly representing proctitis, formed stool in the proximal rectum  INR 1.8 WBC 4.9/hemoglobin 11.8/hematocrit 37 stable TSH 10/27/2020 70.4-down to 15.1 today  Patient had just been up walking with therapy, says he is feeling a lot better, he denies any abdominal or rectal pain at present.  He says may be some mild rectal soreness still but nothing significant like he had been having previously, no active bleeding.  He did have bowel movements yesterday with the smog enema, no bowel movements overnight or today so far  Daughter present in the room asking questions about discharge.  Patient resides at [place] in independent living.    Objective   Vital signs in last 24 hours: Temp:  [98.2 F (36.8 C)-98.3 F (36.8 C)] 98.2 F (36.8 C) (10/11 0530) Pulse Rate:  [60-68] 68 (10/11 0530) Resp:  [18-20] 18 (10/11 0530) BP: (114-130)/(62-72) 130/62 (10/11 0530) SpO2:  [93 %-95 %] 95 % (10/11 0530) Last BM Date: 11/22/20 General:    Elderly white male in NAD Heart:  Regular rate and rhythm; no murmurs Lungs: Respirations even and unlabored, lungs CTA bilaterally Abdomen:  Soft, nontender and nondistended. Normal bowel sounds. Extremities:  Without edema. Neurologic:  Alert and oriented,  grossly normal neurologically. Psych:  Cooperative. Normal mood and affect.  Intake/Output from previous day: 10/10 0701 - 10/11 0700 In: 180 [P.O.:180] Out: 200 [Urine:200] Intake/Output this shift: No intake/output data recorded.  Lab Results: Recent Labs    11/21/20 0352 11/22/20 0403 11/23/20 0334  WBC  --   --  4.9  HGB 11.3* 11.9* 11.8*  HCT 36.0* 36.6* 37.0*  PLT  --   --  208   BMET Recent Labs    11/23/20 0334  NA 137  K  4.1  CL 100  CO2 31  GLUCOSE 99  BUN 15  CREATININE 1.17  CALCIUM 8.4*   LFT No results for input(s): PROT, ALBUMIN, AST, ALT, ALKPHOS, BILITOT, BILIDIR, IBILI in the last 72 hours. PT/INR Recent Labs    11/22/20 0403 11/23/20 0334  LABPROT 19.2* 20.8*  INR 1.6* 1.8*    Studies/Results: CT PELVIS W CONTRAST  Result Date: 11/21/2020 CLINICAL DATA:  Severe persistent rectal pain. EXAM: CT PELVIS WITH CONTRAST TECHNIQUE: Multidetector CT imaging of the pelvis was performed using the standard protocol following the bolus administration of intravenous contrast. CONTRAST:  35mL OMNIPAQUE IOHEXOL 350 MG/ML SOLN COMPARISON:  November 15, 2020 FINDINGS: Urinary Tract: Distended otherwise normal bladder. Multiple renal cysts. Bowel: Mild circumferential mucosal thickening of the distal rectum. Formed stool within the proximal rectum, which measures 7 cm in cross-section. Vascular/Lymphatic: No pathologically enlarged lymph nodes. Tortuosity and calcific atherosclerotic disease of the aorta. Reproductive: Enlargement of the prostate gland, which measures 5.9 cm transversely. Other:  None. Musculoskeletal: Spondylosis of the lumbosacral spine. IMPRESSION: 1. Mild circumferential mucosal thickening of the distal rectum, which may represent proctitis or internal hemorrhoids. Formed stool within the proximal rectum, which measures 7 cm in cross-section. 2. Enlargement of the prostate gland. Please correlate to serum PSA values. 3. Distended otherwise normal urinary bladder. 4. Multiple renal cysts. Aortic Atherosclerosis (ICD10-I70.0). Electronically Signed   By: Fidela Salisbury M.D.   On: 11/21/2020 14:20  Assessment / Plan:    #20 87 year old white male with urinary retention likely secondary to large fecal impaction, continues with Foley in place #2 severe obstipation and fecal impaction, rectal bleeding likely secondary to stercoral ulceration and/or local anal rectal inflammatory process due  to impaction  Patient is had significant improvement after several smog enemas and disimpaction and is no longer having anal rectal or bladder spasms and no significant pain. He still had significant stool in the proximal rectum on CT as of 11/21/2020, and has had further enemas since as well as further stools.  Will check KUB today to assess for any residual impaction/significant retained stool Smog enema this a.m. Restart MiraLAX 17 g in 8 ounces of water twice daily and plan to continue twice daily MiraLAX on discharge Patient also currently still has Dulcolax ordered 1-2 at bedtime, which can be continued at discharge though we will have to assess how he does with MiraLAX. 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  Agree with interval progress note as outlined above.  KUB from today shows clearance of impaction.  Agree with plans for chronic bowel regimen.  No further work-up from GI perspective.  Okay for discharge from GI perspective.  Please call for questions or problems.  Thanks.  Docia Chuck. Geri Seminole., M.D. [facility] Division of Gastroenterology

## 2020-11-23 NOTE — TOC Initial Note (Signed)
Transition of Care [facility]) - Initial/Assessment Note    Patient Details  Name: NORTH ESTERLINE MRN: [id number] Date of Birth: [birth year]  Transition of Care [facility]) CM/SW Contact:    Ross Ludwig, LCSW Phone Number: 11/23/2020, 6:20 PM  Clinical Narrative:                  Patient is from [facility].  Plan is for patient to return back home with his wife.  PT is recommending home health, [facility] has their own home health agency.  Per Butch Penny at [facility] fax [place] orders.  Call Butch Penny at [phone] once patient is medically ready for discharge.  CSW to continue to follow patient's progress throughout discharge planning.  Expected Discharge Plan: [place] Barriers to Discharge: Continued Medical Work up   Patient Goals and CMS Choice Patient states their goals for this hospitalization and ongoing recovery are:: To return back home with his wife with home health services. CMS Medicare.gov Compare Post Acute Care list provided to:: Other (Comment Required) Butch Penny at [facility])    Expected Discharge Plan and Services Expected Discharge Plan: [place]       Living arrangements for the past 2 months: [place] ([facility])                                      Prior Living Arrangements/Services Living arrangements for the past 2 months: [place] ([facility]) Lives with:: Spouse Patient language and need for interpreter reviewed:: Yes Do you feel safe going back to the place where you live?: Yes      Need for Family Participation in Patient Care: Yes (Comment) Care giver support system in place?: No (comment)   Criminal Activity/Legal Involvement Pertinent to Current Situation/Hospitalization: No - Comment as needed  Activities of Daily Living Home Assistive Devices/Equipment: Eyeglasses ADL Screening (condition at time of admission) Patient's cognitive ability adequate to safely  complete daily activities?: Yes Is the patient deaf or have difficulty hearing?: No Does the patient have difficulty seeing, even when wearing glasses/contacts?: No Does the patient have difficulty concentrating, remembering, or making decisions?: No Patient able to express need for assistance with ADLs?: Yes Does the patient have difficulty dressing or bathing?: No Independently performs ADLs?: Yes (appropriate for developmental age) Does the patient have difficulty walking or climbing stairs?: Yes (secondary to weakness) Weakness of Legs: Both Weakness of Arms/Hands: None  Permission Sought/Granted Permission sought to share information with : Family Supports, Customer service manager Permission granted to share information with : Yes, Release of Information Signed  Share Information with NAMELorraine, Terriquez Spouse [phone]  Permission granted to share info w AGENCY: [facility] and SNF        Emotional Assessment Appearance:: Appears stated age   Affect (typically observed): Calm, Appropriate, Pleasant Orientation: : Oriented to Self, Oriented to Place, Oriented to  Time Alcohol / Substance Use: Not Applicable Psych Involvement: No (comment)  Admission diagnosis:  Rectal bleeding [K62.5] Elevated INR [R79.1] Rectal bleed [K62.5] Patient Active Problem List   Diagnosis Date Noted   Rectal bleeding 11/18/2020   Pressure injury of skin 11/18/2020   Rectal bleed 11/17/2020   Multifocal pneumonia 08/04/2020   Right lower lobe lung mass    DOE (dyspnea on exertion) 06/03/2019   Near syncope 06/03/2019  Dysphagia 03/07/2018   Thyroid cancer ([place]) 02/10/2016   Medicare annual wellness visit, subsequent 02/02/2015   Hypertriglyceridemia 12/22/2013   Encounter for therapeutic drug monitoring 04/09/2013   History of basal cell cancer 10/30/2012   Long term (current) use of anticoagulants 05/23/2010   Protein C deficiency ([place])  11/05/2009   Post-surgical hypothyroidism 01/13/2008   Elevated PSA measurement 01/13/2008   Melanoma ([place]) 01/13/2008   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

## 2020-11-23 NOTE — Progress Notes (Signed)
Subjective: Clarence Hopkins has had relief of the pain with further bowel movements and is quite comfortable this morning.  He is tolerating the foley well which was replaced yesterday.  ROS:  Review of Systems  All other systems reviewed and are negative.  Anti-infectives: Anti-infectives (From admission, onward)    None       Current Facility-Administered Medications  Medication Dose Route Frequency Provider Last Rate Last Admin   acetaminophen (TYLENOL) tablet 650 mg  650 mg Oral Q6H PRN Nicole Kindred A, DO   650 mg at 11/21/20 2201   belladonna-opium (B&O) suppository 16.2-30 mg  1 suppository Rectal Q6H PRN Irine Seal, MD       bisacodyl (DULCOLAX) EC tablet 5 mg  5 mg Oral Daily Nicole Kindred A, DO   5 mg at 11/22/20 1000   Chlorhexidine Gluconate Cloth 2 % PADS 6 each  6 each Topical Daily Nicole Kindred A, DO   6 each at 11/20/20 9563   dicyclomine (BENTYL) tablet 20 mg  20 mg Oral TID AC & HS Nicole Kindred A, DO   20 mg at 11/22/20 1920   diltiazem 2 % gel   Topical TID Nicole Kindred A, DO       feeding supplement (ENSURE ENLIVE / ENSURE PLUS) liquid 237 mL  237 mL Oral TID BM Nicole Kindred A, DO   237 mL at 11/22/20 1400   finasteride (PROSCAR) tablet 5 mg  5 mg Oral Daily Nicole Kindred A, DO   5 mg at 11/22/20 1000   hydrocortisone-pramoxine (ANALPRAM-HC) 2.5-1 % rectal cream   Rectal QID Esterwood, Amy S, PA-C   Given at 11/23/20 0530   levothyroxine (SYNTHROID) tablet 175 mcg  175 mcg Oral QAC breakfast Nicole Kindred A, DO   175 mcg at 11/23/20 0530   lidocaine (XYLOCAINE) 5 % ointment   Topical Q6H Esterwood, Amy S, PA-C   Given at 11/23/20 0530   metoprolol tartrate (LOPRESSOR) injection 5 mg  5 mg Intravenous Q6H PRN Elwyn Reach, MD       ondansetron (ZOFRAN) tablet 4 mg  4 mg Oral Q6H PRN Elwyn Reach, MD       Or   ondansetron (ZOFRAN) injection 4 mg  4 mg Intravenous Q6H PRN Elwyn Reach, MD       polyvinyl alcohol (LIQUIFILM TEARS) 1.4  % ophthalmic solution 1 drop  1 drop Both Eyes PRN Nicole Kindred A, DO       senna-docusate (Senokot-S) tablet 1 tablet  1 tablet Oral BID Nicole Kindred A, DO   1 tablet at 11/22/20 2101   sorbitol, milk of mag, mineral oil, glycerin (SMOG) enema  300 mL Rectal BID Carol Ada, MD   300 mL at 11/22/20 1600   tamsulosin (FLOMAX) capsule 0.4 mg  0.4 mg Oral QPC supper Nicole Kindred A, DO   0.4 mg at 11/22/20 1929   Warfarin - Pharmacist Dosing Inpatient   Does not apply q1600 Ezekiel Slocumb, DO   Given at 11/22/20 2059     Objective: Vital signs in last 24 hours: Temp:  [98.2 F (36.8 C)-98.3 F (36.8 C)] 98.2 F (36.8 C) (10/11 0530) Pulse Rate:  [60-68] 68 (10/11 0530) Resp:  [18-20] 18 (10/11 0530) BP: (114-130)/(62-72) 130/62 (10/11 0530) SpO2:  [93 %-95 %] 95 % (10/11 0530)  Intake/Output from previous day: 10/10 0701 - 10/11 0700 In: 180 [P.O.:180] Out: 200 [Urine:200] Intake/Output this shift: No intake/output data recorded.   Physical Exam  Lab Results:  Recent Labs    11/22/20 0403 11/23/20 0334  WBC  --  4.9  HGB 11.9* 11.8*  HCT 36.6* 37.0*  PLT  --  208   BMET Recent Labs    11/23/20 0334  NA 137  K 4.1  CL 100  CO2 31  GLUCOSE 99  BUN 15  CREATININE 1.17  CALCIUM 8.4*   PT/INR Recent Labs    11/22/20 0403 11/23/20 0334  LABPROT 19.2* 20.8*  INR 1.6* 1.8*   ABG No results for input(s): PHART, HCO3 in the last 72 hours.  Invalid input(s): PCO2, PO2  Studies/Results: CT PELVIS W CONTRAST  Result Date: 11/21/2020 CLINICAL DATA:  Severe persistent rectal pain. EXAM: CT PELVIS WITH CONTRAST TECHNIQUE: Multidetector CT imaging of the pelvis was performed using the standard protocol following the bolus administration of intravenous contrast. CONTRAST:  52mL OMNIPAQUE IOHEXOL 350 MG/ML SOLN COMPARISON:  November 15, 2020 FINDINGS: Urinary Tract: Distended otherwise normal bladder. Multiple renal cysts. Bowel: Mild circumferential mucosal  thickening of the distal rectum. Formed stool within the proximal rectum, which measures 7 cm in cross-section. Vascular/Lymphatic: No pathologically enlarged lymph nodes. Tortuosity and calcific atherosclerotic disease of the aorta. Reproductive: Enlargement of the prostate gland, which measures 5.9 cm transversely. Other:  None. Musculoskeletal: Spondylosis of the lumbosacral spine. IMPRESSION: 1. Mild circumferential mucosal thickening of the distal rectum, which may represent proctitis or internal hemorrhoids. Formed stool within the proximal rectum, which measures 7 cm in cross-section. 2. Enlargement of the prostate gland. Please correlate to serum PSA values. 3. Distended otherwise normal urinary bladder. 4. Multiple renal cysts. Aortic Atherosclerosis (ICD10-I70.0). Electronically Signed   By: Fidela Salisbury M.D.   On: 11/21/2020 14:20     Assessment and Plan: Fecal impaction with anorectal pain.  The pain has resolved with resolution of the impaction.  BPH with retention.  He is tolerating the foley.  I will arrange office follow up for further evaluation.       LOS: 5 days    Irine Seal 11/23/2020 [phone] Patient ID: Clarence Hopkins, male   DOB: 11-18-1930, 87 y.o.   MRN: [id number]

## 2020-11-24 LAB — PROTIME-INR
INR: 2.3 — ABNORMAL HIGH (ref 0.8–1.2)
Prothrombin Time: 25.5 seconds — ABNORMAL HIGH (ref 11.4–15.2)

## 2020-11-24 MED ORDER — POLYETHYLENE GLYCOL 3350 17 G PO PACK: 17.0000 g | PACK | Freq: Two times a day (BID) | ORAL | 1 refills | Status: AC

## 2020-11-24 MED ORDER — BISACODYL 5 MG PO TBEC: 5.0000 mg | DELAYED_RELEASE_TABLET | Freq: Every day | ORAL | 0 refills | Status: DC

## 2020-11-24 MED ORDER — WARFARIN SODIUM 5 MG PO TABS: 5.0000 mg | ORAL_TABLET | Freq: Once | ORAL | Status: DC

## 2020-11-24 MED ORDER — HYDROCORT-PRAMOXINE (PERIANAL) 2.5-1 % EX CREA: TOPICAL_CREAM | Freq: Four times a day (QID) | CUTANEOUS | 0 refills | Status: DC

## 2020-11-24 MED ORDER — DILTIAZEM GEL 2 %: 1.0000 "application " | Freq: Three times a day (TID) | CUTANEOUS | 0 refills | Status: DC

## 2020-11-24 MED ORDER — SENNOSIDES-DOCUSATE SODIUM 8.6-50 MG PO TABS: 1.0000 | ORAL_TABLET | Freq: Two times a day (BID) | ORAL | 1 refills | Status: AC

## 2020-11-24 NOTE — Discharge Summary (Signed)
[facility]  Physician Discharge Summary   Patient ID: Clarence Hopkins MRN: [id number] DOB/AGE: [birth year] 87 y.o.  Admit date: 11/17/2020 Discharge date: 11/24/2020    PCP: Cassandria Anger, MD  DISCHARGE DIAGNOSES:  Hematochezia, resolved Supratherapeutic INR, resolved Urinary retention, to be discharged with Foley catheter Paroxysmal atrial fibrillation, new onset History of PE and protein C deficiency Hypothyroidism History of BPH  RECOMMENDATIONS FOR OUTPATIENT FOLLOW UP: Patient to follow-up with urology for his Foley catheter and voiding trial Gastroenterology to arrange outpatient follow-up if needed Check thyroid function tests in 4 to 6 weeks.   Home Health: PT/OT Equipment/Devices: None  CODE STATUS: Full code  DISCHARGE CONDITION: fair  Diet recommendation: As before  INITIAL HISTORY: Pt admitted with rectal bleeding in the setting of supratherapeutic INR and recent fecal manual disimpaction.  Patient was also having severely painful rectal vs bladder spasms that have been difficult to get controlled.  This did not improve with Foley removal and medications for bladder spasms.  On imaging patient still moderate amount of stool impaction in the colon.   With aggressive bowel regimen and relief of impaction, the spasms resolved.  Consultations: Gastroenterology Urology  Procedures: Foley placement  HOSPITAL COURSE:   Rectal bleeding /hematochezia/fecal impaction Secondary to supratherapeutic INR and recent fecal disimpaction, suspect due to minor trauma.   No bleeding reported since admission.  Hemoglobin stable.    Supratherapeutic INR -presented with INR 4.8 in setting of rectal bleeding.  Given oral vitamin K in the ED on admission and again on 10/8. INR down to 1.6.    Rectal spasms  Eventually resolved after aggressive bowel regimen and relief of constipation. --Surgery consulted and recommended topical diltiazem, topical lidocaine,  considering fissure as the source however pain was up higher than the anal sphincter.   --Repeated CT pelvis on 10/9 which just showed circumferential mucosal thickening of the distal rectum with formed stool in the proximal rectum measuring up to 7 cm cross-section. --Bowel regimen to prevent constipation/impaction   Urinary retention   Patient follows with Dr. Jeffie Pollock.  Will be discharged with Foley catheter.  Follow-up with urology for voiding trial --Resumed home Gemtesa (patient's own med)   Paroxysmal A-fib  new onset, noted on monitor on admission in setting of his acute problems.  Resolved. Already on warfarin.  Heart rates have been controlled.  Outpatient PCP and/or cardiolgy follow up.   Left lower extremity wound s/p recent excision of skin cancer - WOC consulted.  See their wound care instructions.     History of PE/History of protein C deficiency Chronically anticoagulated with warfarin. -- Warfarin resumed.  INR noted to be therapeutic today.   Hypertriglyceridemia -not on home medication.  Defer to PCP.  Hypothyroidism  Continue levothyroxine. TSH 15.  Free T4 125.  Will recommend continuing current dose for now with follow-up levels to be checked in 4 to 6 weeks.  BPH -continue tamsulosin and finasteride   Hx of metastatic thyroid cancer, skin cancer - follows with oncology, dermatology      Pressure Injury 11/18/20 Sacrum Mid Stage 1 -  Intact skin with non-blanchable redness of a localized area usually over a bony prominence. (Active)  11/18/20 1734  Location: Sacrum  Location Orientation: Mid  Staging: Stage 1 -  Intact skin with non-blanchable redness of a localized area usually over a bony prominence.  Wound Description (Comments):   Present on Admission: Yes     Patient is stable overall.  Okay for discharge home  today with family.  PERTINENT LABS:  The results of significant diagnostics from this hospitalization (including imaging, microbiology,  ancillary and laboratory) are listed below for reference.    Microbiology: Recent Results (from the past 240 hour(s))  Resp Panel by RT-PCR (Flu A&B, Covid) Nasopharyngeal Swab     Status: None   Collection Time: 11/18/20 12:02 AM   Specimen: Nasopharyngeal Swab; Nasopharyngeal(NP) swabs in vial transport medium  Result Value Ref Range Status   SARS Coronavirus 2 by RT PCR NEGATIVE NEGATIVE Final    Comment: (NOTE) SARS-CoV-2 target nucleic acids are NOT DETECTED.  The SARS-CoV-2 RNA is generally detectable in upper respiratory specimens during the acute phase of infection. The lowest concentration of SARS-CoV-2 viral copies this assay can detect is 138 copies/mL. A negative result does not preclude SARS-Cov-2 infection and should not be used as the sole basis for treatment or other patient management decisions. A negative result may occur with  improper specimen collection/handling, submission of specimen other than nasopharyngeal swab, presence of viral mutation(s) within the areas targeted by this assay, and inadequate number of viral copies(<138 copies/mL). A negative result must be combined with clinical observations, patient history, and epidemiological information. The expected result is Negative.  Fact Sheet for Patients:  EntrepreneurPulse.com.au  Fact Sheet for Healthcare Providers:  IncredibleEmployment.be  This test is no t yet approved or cleared by the [place] FDA and  has been authorized for detection and/or diagnosis of SARS-CoV-2 by FDA under an Emergency Use Authorization (EUA). This EUA will remain  in effect (meaning this test can be used) for the duration of the COVID-19 declaration under Section 564(b)(1) of the Act, 21 U.S.C.section 360bbb-3(b)(1), unless the authorization is terminated  or revoked sooner.       Influenza A by PCR NEGATIVE NEGATIVE Final   Influenza B by PCR NEGATIVE NEGATIVE Final    Comment:  (NOTE) The Xpert Xpress SARS-CoV-2/FLU/RSV plus assay is intended as an aid in the diagnosis of influenza from Nasopharyngeal swab specimens and should not be used as a sole basis for treatment. Nasal washings and aspirates are unacceptable for Xpert Xpress SARS-CoV-2/FLU/RSV testing.  Fact Sheet for Patients: EntrepreneurPulse.com.au  Fact Sheet for Healthcare Providers: IncredibleEmployment.be  This test is not yet approved or cleared by the [place] FDA and has been authorized for detection and/or diagnosis of SARS-CoV-2 by FDA under an Emergency Use Authorization (EUA). This EUA will remain in effect (meaning this test can be used) for the duration of the COVID-19 declaration under Section 564(b)(1) of the Act, 21 U.S.C. section 360bbb-3(b)(1), unless the authorization is terminated or revoked.  Performed at [facility], [place] [address], [place], [place] [address]      Labs:  COVID-19 Labs   Lab Results  Component Value Date   [place] 11/18/2020   [place] NEGATIVE 07/12/2019      Basic Metabolic Panel: Recent Labs  Lab 11/19/20 0554 11/23/20 0334  NA 136 137  K 4.5 4.1  CL 103 100  CO2 28 31  GLUCOSE 101* 99  BUN 17 15  CREATININE 1.11 1.17  CALCIUM 8.8* 8.4*    CBC: Recent Labs  Lab 11/18/20 1653 11/18/20 1832 11/20/20 0356 11/21/20 0352 11/22/20 0403 11/23/20 0334  WBC 5.6 7.2  --   --   --  4.9  HGB 11.7* 12.1* 11.8* 11.3* 11.9* 11.8*  HCT 37.5* 39.1 36.9* 36.0* 36.6* 37.0*  MCV 90.1 90.9  --   --   --  89.8  PLT 196 193  --   --   --  208     IMAGING STUDIES DG Abd 1 View  Result Date: 11/23/2020 CLINICAL DATA:  Assess stool burden. EXAM: ABDOMEN - 1 VIEW COMPARISON:  11/19/2020 FINDINGS: Mild residual stool identified within the proximal right colon. Compared with the previous exam there is been interval resolution of large impacted stool ball within the  rectum. IMPRESSION: Interval resolution of large impacted stool ball within the rectum. Mild stool burden noted within the proximal right colon. Electronically Signed   By: Kerby Moors M.D.   On: 11/23/2020 14:47   DG Abd 1 View  Result Date: 11/19/2020 CLINICAL DATA:  Fecal impaction EXAM: ABDOMEN - 1 VIEW COMPARISON:  CT 11/15/2020 FINDINGS: Mild diffuse small and large bowel gas without convincing obstructive pattern. Moderate stool in the colon and small stool in the rectum. No radiopaque calculi IMPRESSION: Overall nonobstructed gas pattern with moderate stool in the colon Electronically Signed   By: Donavan Foil M.D.   On: 11/19/2020 17:24   CT PELVIS W CONTRAST  Result Date: 11/21/2020 CLINICAL DATA:  Severe persistent rectal pain. EXAM: CT PELVIS WITH CONTRAST TECHNIQUE: Multidetector CT imaging of the pelvis was performed using the standard protocol following the bolus administration of intravenous contrast. CONTRAST:  27mL OMNIPAQUE IOHEXOL 350 MG/ML SOLN COMPARISON:  November 15, 2020 FINDINGS: Urinary Tract: Distended otherwise normal bladder. Multiple renal cysts. Bowel: Mild circumferential mucosal thickening of the distal rectum. Formed stool within the proximal rectum, which measures 7 cm in cross-section. Vascular/Lymphatic: No pathologically enlarged lymph nodes. Tortuosity and calcific atherosclerotic disease of the aorta. Reproductive: Enlargement of the prostate gland, which measures 5.9 cm transversely. Other:  None. Musculoskeletal: Spondylosis of the lumbosacral spine. IMPRESSION: 1. Mild circumferential mucosal thickening of the distal rectum, which may represent proctitis or internal hemorrhoids. Formed stool within the proximal rectum, which measures 7 cm in cross-section. 2. Enlargement of the prostate gland. Please correlate to serum PSA values. 3. Distended otherwise normal urinary bladder. 4. Multiple renal cysts. Aortic Atherosclerosis (ICD10-I70.0). Electronically Signed    By: Fidela Salisbury M.D.   On: 11/21/2020 14:20   CT Abdomen Pelvis W Contrast  Result Date: 11/15/2020 CLINICAL DATA:  Urinary retention. EXAM: CT ABDOMEN AND PELVIS WITH CONTRAST TECHNIQUE: Multidetector CT imaging of the abdomen and pelvis was performed using the standard protocol following bolus administration of intravenous contrast. CONTRAST:  16mL OMNIPAQUE IOHEXOL 350 MG/ML SOLN COMPARISON:  None. FINDINGS: Lower chest: Multiple noncalcified lung nodules and lung masses of various sizes are seen within the bilateral lung bases. The largest is seen within the anteromedial aspect of the right lower lobe and measures approximately 3.5 cm x 2.2 cm (incompletely imaged). An 8 mm thick anterior pericardial effusion is noted. Hepatobiliary: Multiple cysts are seen scattered throughout the liver parenchyma. The largest measures approximately 3.6 cm x 2.9 cm and is seen within the inferior aspect of the right lobe. No gallstones, gallbladder wall thickening, or biliary dilatation. Pancreas: Unremarkable. No pancreatic ductal dilatation or surrounding inflammatory changes. Spleen: Normal in size without focal abnormality. Adrenals/Urinary Tract: Adrenal glands are unremarkable. Kidneys are normal in size, without renal calculi or hydronephrosis. Numerous large bilateral simple renal cysts are seen. The largest is seen within the right kidney and measures approximately 12.2 cm x 10.0 cm. A Foley catheter is seen within the urinary bladder. Mild inflammatory fat stranding is seen surrounding the urinary bladder. Stomach/Bowel: Stomach is within normal limits. Appendix appears normal.  A large amount of stool is seen within the distal sigmoid colon and rectum. No evidence of bowel wall thickening, distention, or inflammatory changes. Vascular/Lymphatic: Aortic atherosclerosis. No enlarged abdominal or pelvic lymph nodes. Reproductive: The prostate gland is moderately enlarged. Other: No abdominal wall hernia or  abnormality. No abdominopelvic ascites. Musculoskeletal: Multilevel degenerative changes seen throughout the lumbar spine. IMPRESSION: 1. Multiple noncalcified lung nodules and lung masses consistent with the patient's known pulmonary metastasis 2. Numerous large bilateral simple renal cysts. 3. Multiple hepatic cysts. 4. Enlarged prostate gland. 5. Aortic atherosclerosis. Aortic Atherosclerosis (ICD10-I70.0). Electronically Signed   By: Virgina Norfolk M.D.   On: 11/15/2020 23:23    DISCHARGE EXAMINATION: Vitals:   11/23/20 1438 11/23/20 1439 11/23/20 2222 11/24/20 0511  BP: (!) 156/88 (!) 156/88 135/77 137/77  Pulse: 72 72 71 63  Resp: 18  18 18   Temp: 98.4 F (36.9 C) (!) 97.4 F (36.3 C) 98.2 F (36.8 C) 97.9 F (36.6 C)  TempSrc: Oral Oral Oral Oral  SpO2: 97% 97% 93% 94%  Weight:      Height:       General appearance: Awake alert.  In no distress Resp: Clear to auscultation bilaterally.  Normal effort Cardio: S1-S2 is normal regular.  No S3-S4.  No rubs murmurs or bruit GI: Abdomen is soft.  Nontender nondistended.  Bowel sounds are present normal.  No masses organomegaly    DISPOSITION: Home  Discharge Instructions     Call MD for:  difficulty breathing, headache or visual disturbances   Complete by: As directed    Call MD for:  extreme fatigue   Complete by: As directed    Call MD for:  persistant dizziness or light-headedness   Complete by: As directed    Call MD for:  persistant nausea and vomiting   Complete by: As directed    Call MD for:  severe uncontrolled pain   Complete by: As directed    Call MD for:  temperature >100.4   Complete by: As directed    Diet - low sodium heart healthy   Complete by: As directed    Discharge instructions   Complete by: As directed    Please take your medications as prescribed.  Avoid constipation.  Seek attention if you notice any bleeding from the rectal area or develop worsening abdominal pain.  You were cared for by a  hospitalist during your hospital stay. If you have any questions about your discharge medications or the care you received while you were in the hospital after you are discharged, you can call the unit and asked to speak with the hospitalist on call if the hospitalist that took care of you is not available. Once you are discharged, your primary care physician will handle any further medical issues. Please note that NO REFILLS for any discharge medications will be authorized once you are discharged, as it is imperative that you return to your primary care physician (or establish a relationship with a primary care physician if you do not have one) for your aftercare needs so that they can reassess your need for medications and monitor your lab values. If you do not have a primary care physician, you can call [phone] for a physician referral.   Discharge wound care:   Complete by: As directed    Wound care  Daily      Comments: Wash wound on posterior left lower leg (dermatology surgical site) with saline. Pat dry. Place a non-adherent telfa pad  over the wound, secure with a few turns of kerlix.   Increase activity slowly   Complete by: As directed          Allergies as of 11/24/2020   No Known Allergies      Medication List     TAKE these medications    bisacodyl 10 MG suppository Commonly known as: Dulcolax Place 1 suppository (10 mg total) rectally as needed for moderate constipation. What changed: Another medication with the same name was added. Make sure you understand how and when to take each.   bisacodyl 5 MG EC tablet Commonly known as: DULCOLAX Take 1 tablet (5 mg total) by mouth daily. What changed: You were already taking a medication with the same name, and this prescription was added. Make sure you understand how and when to take each.   diltiazem 2 % Gel Apply 1 application topically 3 (three) times daily. apply a small amount inside the anal opening and to the external  anal area tid for 2 weeks   finasteride 5 MG tablet Commonly known as: PROSCAR Take 5 mg by mouth daily.   Gemtesa 75 MG Tabs Generic drug: Vibegron Take 75 mg by mouth daily.   hydrocortisone-pramoxine 2.5-1 % rectal cream Commonly known as: ANALPRAM-HC Place rectally 4 (four) times daily. Apply to anus and 1 inch into anus   levothyroxine 175 MCG tablet Commonly known as: SYNTHROID Take 175 mcg by mouth daily before breakfast.   polyethylene glycol 17 g packet Commonly known as: MIRALAX / GLYCOLAX Take 17 g by mouth 2 (two) times daily.   polyvinyl alcohol 1.4 % ophthalmic solution Commonly known as: LIQUIFILM TEARS Place 1 drop into both eyes as needed for dry eyes.   senna-docusate 8.6-50 MG tablet Commonly known as: Senokot-S Take 1 tablet by mouth 2 (two) times daily.   tamsulosin 0.4 MG Caps capsule Commonly known as: FLOMAX TAKE 1 CAPSULE BY MOUTH DAILY AFTER SUPPER What changed: See the new instructions.   warfarin 5 MG tablet Commonly known as: COUMADIN Take as directed. If you are unsure how to take this medication, talk to your nurse or doctor. Original instructions: TAKE 1 TABLET DAILY OR AS DIRECTED BY ANTICOAGULATION CLINIC What changed: See the new instructions.               Discharge Care Instructions  (From admission, onward)           Start     Ordered   11/24/20 0000  Discharge wound care:       Comments: Wound care  Daily      Comments: Wash wound on posterior left lower leg (dermatology surgical site) with saline. Pat dry. Place a non-adherent telfa pad over the wound, secure with a few turns of kerlix.   11/24/20 1021              Follow-up Information     [facility] Follow up.   Why: If you have not been scheduled yet, the office will call to arrange follow up. Contact information: [place] [phone]        Plotnikov, Evie Lacks, MD Follow up in 1  week(s).   Specialty: Internal Medicine Contact information: [place] [address] [phone]                 TOTAL DISCHARGE TIME: 74 minutes  [place]  [facility] Pager on [email or web address]  11/25/2020,  11:28 AM

## 2020-11-24 NOTE — Progress Notes (Signed)
ANTICOAGULATION CONSULT NOTE -   Consult  Pharmacy Consult for Warfarin Indication: h/o PE and protein C deficiency  No Known Allergies  Patient Measurements: Height: 6' 3.5" (191.8 cm) Weight: 93.9 kg (207 lb) IBW/kg (Calculated) : 85.65  Vital Signs: Temp: 97.9 F (36.6 C) (10/12 0511) Temp Source: Oral (10/12 0511) BP: 137/77 (10/12 0511) Pulse Rate: 63 (10/12 0511)  Labs: Recent Labs    11/22/20 0403 11/23/20 0334 11/24/20 0341  HGB 11.9* 11.8*  --   HCT 36.6* 37.0*  --   PLT  --  208  --   LABPROT 19.2* 20.8* 25.5*  INR 1.6* 1.8* 2.3*  CREATININE  --  1.17  --      Estimated Creatinine Clearance: 52.9 mL/min (by C-G formula based on SCr of 1.17 mg/dL).   Medical History: Past Medical History:  Diagnosis Date   Arthritis    Cancer ([place]) 2003   melanoma  L ear   GERD (gastroesophageal reflux disease)    Hypothyroidism    post op   Lung mass    Pulmonary embolism ([place]) 2005 & 2007   protein C deficiency    Medications:  Warfarin 5 mg daily except 7.5 mg MWF PTA.  Med Mgmt note from 10/5 instructed pt/caregiver to reduce dose of warfarin due to recent Rx for Levaquin- 10/5 was last dose should have been 5 mg per office instructions but unclear if took 5 mg or 7.5 mg   Assessment: 87 y/o M on chronic warfarin for h/o PE and protein C deficiency admitted with rectal bleeding and supra-therapeutic INR. Given 2.5 mg vitamin K po 10/5 and repeated 5 mg po 10/8 with INR now finally in therapeutic range. No significant DDI with current regimen.   Today, 11/24/20 INR therapeutic at 2.3, anticipate further increase Hgb 11.8- stable  Charted that patient is eating 50% of meals  Goal of Therapy:  INR 2-3   Plan:  Warfarin 5 mg once today PT/INR daily Monitor for signs and symptoms of bleeding  Dimple Nanas, PharmD 11/24/2020 7:16 AM

## 2020-11-25 ENCOUNTER — Telehealth: Payer: Self-pay

## 2020-11-25 NOTE — Telephone Encounter (Signed)
Transition Care Management Unsuccessful Follow-up Telephone Call  Date of discharge and from where:  [facility] 11/24/2020  Attempts:  1st Attempt  Reason for unsuccessful TCM follow-up call:  Unable to reach patient   Needs follow up appointment 1-2 weeks with PCP   L.Andrej Spagnoli,LPN

## 2020-11-26 ENCOUNTER — Encounter: Payer: Self-pay | Admitting: Internal Medicine

## 2020-11-26 ENCOUNTER — Telehealth: Payer: Self-pay

## 2020-11-26 ENCOUNTER — Non-Acute Institutional Stay (SKILLED_NURSING_FACILITY): Payer: Medicare Other | Admitting: Adult Health

## 2020-11-26 DIAGNOSIS — K602 Anal fissure, unspecified: Secondary | ICD-10-CM

## 2020-11-26 DIAGNOSIS — S81802A Unspecified open wound, left lower leg, initial encounter: Secondary | ICD-10-CM

## 2020-11-26 DIAGNOSIS — R338 Other retention of urine: Secondary | ICD-10-CM | POA: Diagnosis not present

## 2020-11-26 DIAGNOSIS — M79662 Pain in left lower leg: Secondary | ICD-10-CM | POA: Diagnosis not present

## 2020-11-26 DIAGNOSIS — R278 Other lack of coordination: Secondary | ICD-10-CM | POA: Diagnosis not present

## 2020-11-26 DIAGNOSIS — R2681 Unsteadiness on feet: Secondary | ICD-10-CM | POA: Diagnosis not present

## 2020-11-26 DIAGNOSIS — I480 Paroxysmal atrial fibrillation: Secondary | ICD-10-CM

## 2020-11-26 DIAGNOSIS — K5901 Slow transit constipation: Secondary | ICD-10-CM

## 2020-11-26 DIAGNOSIS — K625 Hemorrhage of anus and rectum: Secondary | ICD-10-CM | POA: Diagnosis not present

## 2020-11-26 DIAGNOSIS — R21 Rash and other nonspecific skin eruption: Secondary | ICD-10-CM

## 2020-11-26 DIAGNOSIS — R339 Retention of urine, unspecified: Secondary | ICD-10-CM | POA: Diagnosis not present

## 2020-11-26 DIAGNOSIS — C73 Malignant neoplasm of thyroid gland: Secondary | ICD-10-CM

## 2020-11-26 DIAGNOSIS — R413 Other amnesia: Secondary | ICD-10-CM | POA: Diagnosis not present

## 2020-11-26 DIAGNOSIS — R41840 Attention and concentration deficit: Secondary | ICD-10-CM | POA: Diagnosis not present

## 2020-11-26 DIAGNOSIS — D6859 Other primary thrombophilia: Secondary | ICD-10-CM | POA: Diagnosis not present

## 2020-11-26 DIAGNOSIS — L0889 Other specified local infections of the skin and subcutaneous tissue: Secondary | ICD-10-CM | POA: Diagnosis not present

## 2020-11-26 DIAGNOSIS — M6389 Disorders of muscle in diseases classified elsewhere, multiple sites: Secondary | ICD-10-CM | POA: Diagnosis not present

## 2020-11-26 DIAGNOSIS — R2689 Other abnormalities of gait and mobility: Secondary | ICD-10-CM | POA: Diagnosis not present

## 2020-11-26 NOTE — Telephone Encounter (Signed)
Transition Care Management Unsuccessful Follow-up Telephone Call  Date of discharge and from where:  11/24/2020  Attempts:  2nd Attempt  Reason for unsuccessful TCM follow-up call:  Left voice message  Nurse Notes: Left voice message on voicemail. Needs Hospital follow up with PCP x 1 week  S. Jailyne Chieffo, LPN.

## 2020-11-26 NOTE — Progress Notes (Signed)
Location:   [facility] Room Number: 151 Place of Service:  SNF (31) Provider:  Phebe Colla, NP  Plotnikov, Evie Lacks, MD  Patient Care Team: Cassandria Anger, MD as PCP - General (Internal Medicine) Shon Hough, MD as Consulting Physician (Ophthalmology) [name], MD as Consulting Physician (Dermatology) Oneita Kras, MD as Referring Physician (Endocrinology) [name], MD as Medical Oncologist (Oncology) Szabat, Darnelle Maffucci, [facility] (Pharmacist) Delice Bison, Darnelle Maffucci, [facility] as Pharmacist (Pharmacist)  Extended Emergency Contact Information Primary Emergency Contact: Busch,[name] Address: [place], [place] Phone: [phone] Work Phone: [phone] Mobile Phone: [phone] Relation: Spouse  Code Status:  Full Code Goals of care: Advanced Directive information Advanced Directives 11/26/2020  Does Patient Have a Medical Advance Directive? No  Type of Advance Directive -  Does patient want to make changes to medical advance directive? -  Copy of [place] in Chart? -  Would patient like information on creating a medical advance directive? No - Patient declined     Chief Complaint  Patient presents with  . Error    Opened in error  . Error    HPI:  Pt is a 87 y.o. male seen today for an acute visit for    Past Medical History:  Diagnosis Date  . Arthritis   . Cancer ([place]) 2003   melanoma  L ear  . GERD (gastroesophageal reflux disease)   . Hypothyroidism    post op  . Lung mass   . Pulmonary embolism ([place]) 2005 & 2007   protein C deficiency   Past Surgical History:  Procedure Laterality Date  . BRONCHIAL BRUSHINGS  07/15/2019   Procedure: BRONCHIAL BRUSHINGS;  Surgeon: Rigoberto Noel, MD;  Location: [facility];  Service: Cardiopulmonary;;  . BRONCHIAL WASHINGS  07/15/2019   Procedure: BRONCHIAL WASHINGS;  Surgeon: Rigoberto Noel, MD;  Location[facility] ENDOSCOPY;  Service: Cardiopulmonary;;  . CARDIAC CATHETERIZATION     > 20 years ago " everything was okay"   . CERVICAL FUSION      X 2; Dr Vertell Limber  . COLONOSCOPY    . ENDOBRONCHIAL ULTRASOUND N/A 07/15/2019   Procedure: ENDOBRONCHIAL ULTRASOUND;  Surgeon: Rigoberto Noel, MD;  Location: [facility];  Service: Cardiopulmonary;  Laterality: N/A;  . FINE NEEDLE ASPIRATION  07/15/2019   Procedure: FINE NEEDLE ASPIRATION (FNA) LINEAR;  Surgeon: Rigoberto Noel, MD;  Location: [facility] [place];  Service: Cardiopulmonary;;  . FINGER ARTHROPLASTY Right 04/17/2013   Procedure: IMPLANT ARTHROPLASTY RIGHT INDEX;  Surgeon: Cammie Sickle., MD;  Location: [facility];  Service: Orthopedics;  Laterality: Right;  . LUMBAR LAMINECTOMY  2010   Dr Becky Sax  . MELANOMA EXCISION  2003   L ear  . THYROIDECTOMY  1971   cytology indefinite  . TONSILLECTOMY    . TOTAL KNEE ARTHROPLASTY  2004   left  . VIDEO BRONCHOSCOPY N/A 07/15/2019   Procedure: VIDEO BRONCHOSCOPY WITHOUT FLUORO;  Surgeon: Rigoberto Noel, MD;  Location: [facility];  Service: Cardiopulmonary;  Laterality: N/A;  . WISDOM TOOTH EXTRACTION      No Known Allergies  Allergies as of 11/26/2020   No Known Allergies     Medication List       Accurate as of November 26, 2020 11:59 PM. If you have any questions, ask your nurse or doctor.  bisacodyl 10 MG suppository Commonly known as: Dulcolax Place 1 suppository (10 mg total) rectally as needed for moderate constipation.   bisacodyl 5 MG EC tablet Commonly known as: DULCOLAX Take 1 tablet (5 mg total) by mouth daily.   diltiazem 2 % Gel Apply 1 application topically 3 (three) times daily. apply a small amount inside the anal opening and to the external anal area tid for 2 weeks   finasteride 5 MG tablet Commonly known as: PROSCAR Take 5 mg by mouth daily.   Gemtesa 75 MG Tabs Generic drug: Vibegron Take 75 mg by mouth daily.    hydrocortisone-pramoxine 2.5-1 % rectal cream Commonly known as: ANALPRAM-HC Place rectally 4 (four) times daily. Apply to anus and 1 inch into anus   levothyroxine 175 MCG tablet Commonly known as: SYNTHROID Take 175 mcg by mouth daily before breakfast.   polyethylene glycol 17 g packet Commonly known as: MIRALAX / GLYCOLAX Take 17 g by mouth 2 (two) times daily.   polyvinyl alcohol 1.4 % ophthalmic solution Commonly known as: LIQUIFILM TEARS Place 1 drop into both eyes as needed for dry eyes.   senna-docusate 8.6-50 MG tablet Commonly known as: Senokot-S Take 1 tablet by mouth 2 (two) times daily.   tamsulosin 0.4 MG Caps capsule Commonly known as: FLOMAX TAKE 1 CAPSULE BY MOUTH DAILY AFTER SUPPER What changed: See the new instructions.   triamcinolone cream 0.1 % Commonly known as: KENALOG Apply 1 application topically 2 (two) times daily for 7 days. Started by: [name], NP   warfarin 5 MG tablet Commonly known as: COUMADIN Take as directed by the anticoagulation clinic. If you are unsure how to take this medication, talk to your nurse or doctor. Original instructions: TAKE 1 TABLET DAILY OR AS DIRECTED BY ANTICOAGULATION CLINIC       Review of Systems  Immunization History  Administered Date(s) Administered  . Influenza Split 11/13/2012  . Influenza Whole 11/13/2009, 11/14/2011  . Influenza, High Dose Seasonal PF 11/23/2015, 11/27/2016, 11/27/2017, 11/14/2018  . Influenza-Unspecified 12/08/2013, 12/07/2014  . PFIZER Comirnaty(Gray Top)Covid-19 Tri-Sucrose Vaccine 07/05/2020  . PFIZER(Purple Top)SARS-COV-2 Vaccination 03/02/2019, 03/19/2019, 10/23/2019  . Pneumococcal Conjugate-13 03/05/2017  . Pneumococcal Polysaccharide-23 02/04/2016  . Zoster, Live 03/14/2006   Pertinent  Health Maintenance Due  Topic Date Due  . INFLUENZA VACCINE  09/13/2020   Fall Risk  08/19/2019 03/10/2019 03/07/2018 03/05/2017 02/04/2016  Falls in the past year? 0 0 0 No No   Number falls in past yr: 0 0 - - -  Injury with Fall? 0 0 - - -  Risk for fall due to : No Fall Risks - - - -  Follow up Falls evaluation completed - Falls evaluation completed - -   Functional Status Survey:    Vitals:   11/26/20 0854  BP: 118/60  Pulse: 64  Resp: 18  Temp: 97.6 F (36.4 C)  SpO2: 94%  Weight: 207 lb (93.9 kg)  Height: 6' 3.5" (1.918 m)   Body mass index is 25.53 kg/m. Physical Exam  Labs reviewed: Recent Labs    11/18/20 0149 11/19/20 0554 11/23/20 0334  NA 139 136 137  K 3.8 4.5 4.1  CL 107 103 100  CO2 25 28 31   GLUCOSE 85 101* 99  BUN 13 17 15   CREATININE 1.05 1.11 1.17  CALCIUM 8.4* 8.8* 8.4*    Recent Labs    11/15/20 2050 11/17/20 2115 11/18/20 0149  AST 22 29 23   ALT 16 17 15   ALKPHOS 97  109 90  BILITOT 0.8 1.2 0.9  PROT 7.4 7.3 6.2*  ALBUMIN 4.1 3.8 3.1*    Recent Labs    10/30/20 1334 11/15/20 2050 11/17/20 2115 11/18/20 0145 11/18/20 1653 11/18/20 1832 11/20/20 0356 11/21/20 0352 11/22/20 0403 11/23/20 0334  WBC 8.9 7.8 7.8   < > 5.6 7.2  --   --   --  4.9  NEUTROABS 7.4 6.5 5.7  --   --   --   --   --   --   --   HGB 14.2 12.4* 12.6*   < > 11.7* 12.1*   < > 11.3* 11.9* 11.8*  HCT 44.6 39.7 40.1   < > 37.5* 39.1   < > 36.0* 36.6* 37.0*  MCV 88.3 89.4 89.9   < > 90.1 90.9  --   --   --  89.8  PLT 203 199 198   < > 196 193  --   --   --  208   < > = values in this interval not displayed.    Lab Results  Component Value Date   TSH 15.168 (H) 11/23/2020   No results found for: HGBA1C Lab Results  Component Value Date   CHOL 141 03/14/2019   HDL 38.00 (L) 03/14/2019   LDLCALC 88 03/14/2019   LDLDIRECT 116.0 03/31/2013   TRIG 74.0 03/14/2019   CHOLHDL 4 03/14/2019    Significant Diagnostic Results in last 30 days:  DG Abd 1 View  Result Date: 11/23/2020 CLINICAL DATA:  Assess stool burden. EXAM: ABDOMEN - 1 VIEW COMPARISON:  11/19/2020 FINDINGS: Mild residual stool identified within the proximal right  colon. Compared with the previous exam there is been interval resolution of large impacted stool ball within the rectum. IMPRESSION: Interval resolution of large impacted stool ball within the rectum. Mild stool burden noted within the proximal right colon. Electronically Signed   By: Kerby Moors M.D.   On: 11/23/2020 14:47   DG Abd 1 View  Result Date: 11/19/2020 CLINICAL DATA:  Fecal impaction EXAM: ABDOMEN - 1 VIEW COMPARISON:  CT 11/15/2020 FINDINGS: Mild diffuse small and large bowel gas without convincing obstructive pattern. Moderate stool in the colon and small stool in the rectum. No radiopaque calculi IMPRESSION: Overall nonobstructed gas pattern with moderate stool in the colon Electronically Signed   By: Donavan Foil M.D.   On: 11/19/2020 17:24   CT PELVIS W CONTRAST  Result Date: 11/21/2020 CLINICAL DATA:  Severe persistent rectal pain. EXAM: CT PELVIS WITH CONTRAST TECHNIQUE: Multidetector CT imaging of the pelvis was performed using the standard protocol following the bolus administration of intravenous contrast. CONTRAST:  32mL OMNIPAQUE IOHEXOL 350 MG/ML SOLN COMPARISON:  November 15, 2020 FINDINGS: Urinary Tract: Distended otherwise normal bladder. Multiple renal cysts. Bowel: Mild circumferential mucosal thickening of the distal rectum. Formed stool within the proximal rectum, which measures 7 cm in cross-section. Vascular/Lymphatic: No pathologically enlarged lymph nodes. Tortuosity and calcific atherosclerotic disease of the aorta. Reproductive: Enlargement of the prostate gland, which measures 5.9 cm transversely. Other:  None. Musculoskeletal: Spondylosis of the lumbosacral spine. IMPRESSION: 1. Mild circumferential mucosal thickening of the distal rectum, which may represent proctitis or internal hemorrhoids. Formed stool within the proximal rectum, which measures 7 cm in cross-section. 2. Enlargement of the prostate gland. Please correlate to serum PSA values. 3. Distended  otherwise normal urinary bladder. 4. Multiple renal cysts. Aortic Atherosclerosis (ICD10-I70.0). Electronically Signed   By: Fidela Salisbury M.D.   On: 11/21/2020 14:20  CT Abdomen Pelvis W Contrast  Result Date: 11/15/2020 CLINICAL DATA:  Urinary retention. EXAM: CT ABDOMEN AND PELVIS WITH CONTRAST TECHNIQUE: Multidetector CT imaging of the abdomen and pelvis was performed using the standard protocol following bolus administration of intravenous contrast. CONTRAST:  35mL OMNIPAQUE IOHEXOL 350 MG/ML SOLN COMPARISON:  None. FINDINGS: Lower chest: Multiple noncalcified lung nodules and lung masses of various sizes are seen within the bilateral lung bases. The largest is seen within the anteromedial aspect of the right lower lobe and measures approximately 3.5 cm x 2.2 cm (incompletely imaged). An 8 mm thick anterior pericardial effusion is noted. Hepatobiliary: Multiple cysts are seen scattered throughout the liver parenchyma. The largest measures approximately 3.6 cm x 2.9 cm and is seen within the inferior aspect of the right lobe. No gallstones, gallbladder wall thickening, or biliary dilatation. Pancreas: Unremarkable. No pancreatic ductal dilatation or surrounding inflammatory changes. Spleen: Normal in size without focal abnormality. Adrenals/Urinary Tract: Adrenal glands are unremarkable. Kidneys are normal in size, without renal calculi or hydronephrosis. Numerous large bilateral simple renal cysts are seen. The largest is seen within the right kidney and measures approximately 12.2 cm x 10.0 cm. A Foley catheter is seen within the urinary bladder. Mild inflammatory fat stranding is seen surrounding the urinary bladder. Stomach/Bowel: Stomach is within normal limits. Appendix appears normal. A large amount of stool is seen within the distal sigmoid colon and rectum. No evidence of bowel wall thickening, distention, or inflammatory changes. Vascular/Lymphatic: Aortic atherosclerosis. No enlarged  abdominal or pelvic lymph nodes. Reproductive: The prostate gland is moderately enlarged. Other: No abdominal wall hernia or abnormality. No abdominopelvic ascites. Musculoskeletal: Multilevel degenerative changes seen throughout the lumbar spine. IMPRESSION: 1. Multiple noncalcified lung nodules and lung masses consistent with the patient's known pulmonary metastasis 2. Numerous large bilateral simple renal cysts. 3. Multiple hepatic cysts. 4. Enlarged prostate gland. 5. Aortic atherosclerosis. Aortic Atherosclerosis (ICD10-I70.0). Electronically Signed   By: Virgina Norfolk M.D.   On: 11/15/2020 23:23    Assessment/Plan There are no diagnoses linked to this encounter.   Family/ staff Communication:   Labs/tests ordered:    This encounter was created in error - please disregard.

## 2020-11-27 ENCOUNTER — Encounter: Payer: Self-pay | Admitting: Adult Health

## 2020-11-27 MED ORDER — TRIAMCINOLONE ACETONIDE 0.1 % EX CREA: 1.0000 "application " | TOPICAL_CREAM | Freq: Two times a day (BID) | CUTANEOUS | 0 refills | Status: DC

## 2020-11-27 NOTE — Progress Notes (Addendum)
Location:  Occupational psychologist of Service:  SNF (31) Provider:   Cindi Carbon, [place] [phone]   Plotnikov, Clarence Lacks, MD  Patient Care Team: Cassandria Anger, MD as PCP - General (Internal Medicine) Shon Hough, MD as Consulting Physician (Ophthalmology) [name], MD as Consulting Physician (Dermatology) Oneita Kras, MD as Referring Physician (Endocrinology) [name], MD as Medical Oncologist (Oncology) Szabat, Darnelle Maffucci, [facility] (Pharmacist) Delice Bison, Darnelle Maffucci, [facility] as Pharmacist (Pharmacist)  Extended Emergency Contact Information Primary Emergency Contact: Bergren,[name] Address: [place], [place] Phone: [phone] Work Phone: [phone] Mobile Phone: [phone] Relation: Spouse  Code Status:  Full  Goals of care: Advanced Directive information Advanced Directives 11/26/2020  Does Patient Have a Medical Advance Directive? No  Type of Advance Directive -  Does patient want to make changes to medical advance directive? -  Copy of [place] in Chart? -  Would patient like information on creating a medical advance directive? No - Patient declined     Chief Complaint  Patient presents with   Acute Visit    Eval LLE wound    HPI:  Pt is a 87 y.o. male seen today for a hospital f/u s/p admission from 11/17/20-11/24/20 due to hematochezia, rectal bleeding, supratherapeutic INR, urinary retention, afib new onset, and constipation.  PMH significant for thyroid cancer status post thyroidectomy/radioactive iodine with mets,  PE, protein C deficiency, skin cancer, HLD, BPH. He came to the ER with rectal bleeding and INR was 4.8.  He was given vitamin K in the ER for reversal and is now back on Coumadin at 5 mg daily INR 2.3 on 11/24/20.  He was having rectal pain and was disimpacted due to constipation, also received enemas. Found  to have proctitis and rectal fissure and prescribed hydrocortisone and diltiazem topical.   He is also having urinary retention and bladder spasms and has an indwelling Foley catheter that was placed in sept in the ER for retention. They tried to take the cath out and do I /O caths but this did not help with bladder spasms. Notes indicated he has an elevated PSA. Followed by urology.  At this time he is not having bladder spasms. Bowels are moving.  Denies rectal pain at this time He did have a burst of afib in the hospital but converted back to sinus.   The nurse wanted me to review his wound on the LLE which is from a skin cancer removal. He has two bottles of antibiotics with him clinda and levaquin. He doesn't remember what they were prescribed for. He can't remember what type of skin cancer but does report a graft to the area. Has some mild tenderness but minimal drainage, warmth, or swelling.   Of note, after his [place] went [place] but was not taking care of himself and needed help/motivation. Here at [facility] for assistance. Seems to have some short term memory loss.  Also of note he has pulmonary nodules in both lungs on CT 10/21/20 c/w metastatic disease. Seen by Dr. Hartford Poli at [facility] for metastatic thyroid ca.  Past Medical History:  Diagnosis Date   Arthritis    Cancer ([place]) 2003   melanoma  L ear   GERD (gastroesophageal reflux disease)    Hypothyroidism    post op   Lung mass    Pulmonary  embolism ([place]) 2005 & 2007   protein C deficiency   Past Surgical History:  Procedure Laterality Date   BRONCHIAL BRUSHINGS  07/15/2019   Procedure: BRONCHIAL BRUSHINGS;  Surgeon: Rigoberto Noel, MD;  Location: [facility] ENDOSCOPY;  Service: Cardiopulmonary;;   BRONCHIAL WASHINGS  07/15/2019   Procedure: BRONCHIAL WASHINGS;  Surgeon: Rigoberto Noel, MD;  Location: [place] ENDOSCOPY;  Service: Cardiopulmonary;;   CARDIAC CATHETERIZATION     > 20 years ago " everything was okay"     CERVICAL FUSION      X 2; Dr Vertell Limber   COLONOSCOPY     ENDOBRONCHIAL ULTRASOUND N/A 07/15/2019   Procedure: ENDOBRONCHIAL ULTRASOUND;  Surgeon: Rigoberto Noel, MD;  Location: [place];  Service: Cardiopulmonary;  Laterality: N/A;   FINE NEEDLE ASPIRATION  07/15/2019   Procedure: FINE NEEDLE ASPIRATION (FNA) LINEAR;  Surgeon: Rigoberto Noel, MD;  Location: [place];  Service: Cardiopulmonary;;   FINGER ARTHROPLASTY Right 04/17/2013   Procedure: IMPLANT ARTHROPLASTY RIGHT INDEX;  Surgeon: Cammie Sickle., MD;  Location: [place];  Service: Orthopedics;  Laterality: Right;   LUMBAR LAMINECTOMY  2010   Dr Becky Sax   MELANOMA EXCISION  2003   L ear   THYROIDECTOMY  1971   cytology indefinite   TONSILLECTOMY     TOTAL KNEE ARTHROPLASTY  2004   left   VIDEO BRONCHOSCOPY N/A 07/15/2019   Procedure: VIDEO BRONCHOSCOPY WITHOUT FLUORO;  Surgeon: Rigoberto Noel, MD;  Location: [place];  Service: Cardiopulmonary;  Laterality: N/A;   WISDOM TOOTH EXTRACTION      No Known Allergies  Outpatient Encounter Medications as of 11/26/2020  Medication Sig   bisacodyl (DULCOLAX) 10 MG suppository Place 1 suppository (10 mg total) rectally as needed for moderate constipation.   bisacodyl (DULCOLAX) 5 MG EC tablet Take 1 tablet (5 mg total) by mouth daily.   diltiazem 2 % GEL Apply 1 application topically 3 (three) times daily. apply a small amount inside the anal opening and to the external anal area tid for 2 weeks   finasteride (PROSCAR) 5 MG tablet Take 5 mg by mouth daily.   hydrocortisone-pramoxine (ANALPRAM-HC) 2.5-1 % rectal cream Place rectally 4 (four) times daily. Apply to anus and 1 inch into anus   levothyroxine (SYNTHROID) 175 MCG tablet Take 175 mcg by mouth daily before breakfast.   polyethylene glycol (MIRALAX / GLYCOLAX) 17 g packet Take 17 g by mouth 2 (two) times daily.   polyvinyl alcohol (LIQUIFILM TEARS) 1.4 % ophthalmic solution Place 1 drop into both eyes as  needed for dry eyes.   senna-docusate (SENOKOT-S) 8.6-50 MG tablet Take 1 tablet by mouth 2 (two) times daily.   tamsulosin (FLOMAX) 0.4 MG CAPS capsule TAKE 1 CAPSULE BY MOUTH DAILY AFTER SUPPER (Patient taking differently: Take 0.4 mg by mouth daily after supper.)   Vibegron (GEMTESA) 75 MG TABS Take 75 mg by mouth daily.   warfarin (COUMADIN) 5 MG tablet TAKE 1 TABLET DAILY OR AS DIRECTED BY ANTICOAGULATION CLINIC   No facility-administered encounter medications on file as of 11/26/2020.    Review of Systems  Constitutional:  Negative for activity change, appetite change, chills, diaphoresis, fatigue, fever and unexpected weight change.  Respiratory:  Negative for cough, shortness of breath, wheezing and stridor.   Cardiovascular:  Negative for chest pain, palpitations and leg swelling.  Gastrointestinal:  Negative for abdominal distention, abdominal pain, constipation (improved), diarrhea and rectal pain.  Genitourinary:  Negative for decreased urine volume, difficulty  urinating (has foley), dysuria, flank pain, frequency and urgency.  Musculoskeletal:  Positive for gait problem (uses walker). Negative for arthralgias, back pain, joint swelling and myalgias.  Skin:  Positive for wound.  Neurological:  Negative for dizziness, seizures, syncope, facial asymmetry, speech difficulty, weakness and headaches.  Hematological:  Negative for adenopathy. Does not bruise/bleed easily.  Psychiatric/Behavioral:  Negative for agitation, behavioral problems and confusion.    Immunization History  Administered Date(s) Administered   Influenza Split 11/13/2012   Influenza Whole 11/13/2009, 11/14/2011   Influenza, High Dose Seasonal PF 11/23/2015, 11/27/2016, 11/27/2017, 11/14/2018   Influenza-Unspecified 12/08/2013, 12/07/2014   PFIZER Comirnaty(Gray Top)Covid-19 Tri-Sucrose Vaccine 07/05/2020   PFIZER(Purple Top)SARS-COV-2 Vaccination 03/02/2019, 03/19/2019, 10/23/2019   Pneumococcal Conjugate-13  03/05/2017   Pneumococcal Polysaccharide-23 02/04/2016   Zoster, Live 03/14/2006   Pertinent  Health Maintenance Due  Topic Date Due   INFLUENZA VACCINE  09/13/2020   Fall Risk  08/19/2019 03/10/2019 03/07/2018 03/05/2017 02/04/2016  Falls in the past year? 0 0 0 No No  Number falls in past yr: 0 0 - - -  Injury with Fall? 0 0 - - -  Risk for fall due to : No Fall Risks - - - -  Follow up Falls evaluation completed - Falls evaluation completed - -   Functional Status Survey:    Vitals:   11/27/20 0718  BP: 118/60  Pulse: 64  Resp: 18  Temp: 97.8 F (36.6 C)   There is no height or weight on file to calculate BMI. Physical Exam Vitals and nursing note reviewed.  Constitutional:      General: He is not in acute distress.    Appearance: He is not diaphoretic.  HENT:     Head: Normocephalic and atraumatic.     Mouth/Throat:     Mouth: Mucous membranes are moist.     Pharynx: Oropharynx is clear.  Eyes:     Conjunctiva/sclera: Conjunctivae normal.     Pupils: Pupils are equal, round, and reactive to light.  Neck:     Thyroid: No thyromegaly.     Vascular: No JVD.     Trachea: No tracheal deviation.  Cardiovascular:     Rate and Rhythm: Normal rate and regular rhythm.     Heart sounds: No murmur heard. Pulmonary:     Effort: Pulmonary effort is normal. No respiratory distress.     Breath sounds: Normal breath sounds. No wheezing.  Abdominal:     General: Bowel sounds are normal. There is no distension.     Palpations: Abdomen is soft.     Tenderness: There is no abdominal tenderness.  Genitourinary:    Rectum: Normal.  Musculoskeletal:     Right lower leg: No edema.     Left lower leg: No edema.  Lymphadenopathy:     Cervical: No cervical adenopathy.  Skin:    General: Skin is warm and dry.     Findings: Rash (maculopapular to back) present.     Comments: LLE wound 80% pink 20 % yellow. Mild surrounding erythema and tenderness. No drainage or swelling.    Neurological:     Mental Status: He is alert and oriented to person, place, and time.     Cranial Nerves: No cranial nerve deficit.    Labs reviewed: Recent Labs    11/18/20 0149 11/19/20 0554 11/23/20 0334  NA 139 136 137  K 3.8 4.5 4.1  CL 107 103 100  CO2 25 28 31   GLUCOSE 85 101* 99  BUN  13 17 15   CREATININE 1.05 1.11 1.17  CALCIUM 8.4* 8.8* 8.4*   Recent Labs    11/15/20 2050 11/17/20 2115 11/18/20 0149  AST 22 29 23   ALT 16 17 15   ALKPHOS 97 109 90  BILITOT 0.8 1.2 0.9  PROT 7.4 7.3 6.2*  ALBUMIN 4.1 3.8 3.1*   Recent Labs    10/30/20 1334 11/15/20 2050 11/17/20 2115 11/18/20 0145 11/18/20 1653 11/18/20 1832 11/20/20 0356 11/21/20 0352 11/22/20 0403 11/23/20 0334  WBC 8.9 7.8 7.8   < > 5.6 7.2  --   --   --  4.9  NEUTROABS 7.4 6.5 5.7  --   --   --   --   --   --   --   HGB 14.2 12.4* 12.6*   < > 11.7* 12.1*   < > 11.3* 11.9* 11.8*  HCT 44.6 39.7 40.1   < > 37.5* 39.1   < > 36.0* 36.6* 37.0*  MCV 88.3 89.4 89.9   < > 90.1 90.9  --   --   --  89.8  PLT 203 199 198   < > 196 193  --   --   --  208   < > = values in this interval not displayed.   Lab Results  Component Value Date   TSH 15.168 (H) 11/23/2020   No results found for: HGBA1C Lab Results  Component Value Date   CHOL 141 03/14/2019   HDL 38.00 (L) 03/14/2019   LDLCALC 88 03/14/2019   LDLDIRECT 116.0 03/31/2013   TRIG 74.0 03/14/2019   CHOLHDL 4 03/14/2019    Significant Diagnostic Results in last 30 days:  No results found.  Assessment/Plan  1. Rectal bleeding Resolved with INR normalization   2. Rectal fissure Continue hydrocortisone and diltiazem   3. Slow transit constipation Improved  Continue Miralax and senokot   4. Urinary retention Has foley Continue Gemtesa for bladder spasms Elevated PSA, needs to f/u with urology   5. Thyroid cancer ([place]) On synthroid Metastatic, they have been following the lung nodules for several years S/p thyroidectomy and  radioactive iodine Follows with Dr. Hartford Poli with [facility]  6. Protein C deficiency ([place]) On coumadin  INR Monday   7. Paroxysmal atrial fibrillation (HCC) Resolved, already on anticoagulation   8. Memory loss Mild, not sure if this is new and due to his recent hospitalizations Needs MMSE  9. Rash Triamcinolone 0.1% bid x 7 days   10. LLE wound S/p skin ca removal Does not appear to be infected Continue with dressing changes per [facility] Need derm notes.   Family/ staff Communication: discussed with the nurse and resident   Labs/tests ordered:  CBC BMP INR 10/17

## 2020-11-29 ENCOUNTER — Encounter: Payer: Self-pay | Admitting: Internal Medicine

## 2020-11-29 ENCOUNTER — Non-Acute Institutional Stay (SKILLED_NURSING_FACILITY): Payer: Medicare Other | Admitting: Internal Medicine

## 2020-11-29 DIAGNOSIS — R339 Retention of urine, unspecified: Secondary | ICD-10-CM

## 2020-11-29 DIAGNOSIS — R2681 Unsteadiness on feet: Secondary | ICD-10-CM | POA: Diagnosis not present

## 2020-11-29 DIAGNOSIS — R278 Other lack of coordination: Secondary | ICD-10-CM | POA: Diagnosis not present

## 2020-11-29 DIAGNOSIS — L0889 Other specified local infections of the skin and subcutaneous tissue: Secondary | ICD-10-CM | POA: Diagnosis not present

## 2020-11-29 DIAGNOSIS — I480 Paroxysmal atrial fibrillation: Secondary | ICD-10-CM

## 2020-11-29 DIAGNOSIS — C73 Malignant neoplasm of thyroid gland: Secondary | ICD-10-CM | POA: Diagnosis not present

## 2020-11-29 DIAGNOSIS — R41840 Attention and concentration deficit: Secondary | ICD-10-CM | POA: Diagnosis not present

## 2020-11-29 DIAGNOSIS — K5901 Slow transit constipation: Secondary | ICD-10-CM

## 2020-11-29 DIAGNOSIS — R799 Abnormal finding of blood chemistry, unspecified: Secondary | ICD-10-CM | POA: Diagnosis not present

## 2020-11-29 DIAGNOSIS — K602 Anal fissure, unspecified: Secondary | ICD-10-CM | POA: Diagnosis not present

## 2020-11-29 DIAGNOSIS — R791 Abnormal coagulation profile: Secondary | ICD-10-CM | POA: Diagnosis not present

## 2020-11-29 DIAGNOSIS — R4189 Other symptoms and signs involving cognitive functions and awareness: Secondary | ICD-10-CM

## 2020-11-29 DIAGNOSIS — D6859 Other primary thrombophilia: Secondary | ICD-10-CM

## 2020-11-29 DIAGNOSIS — K625 Hemorrhage of anus and rectum: Secondary | ICD-10-CM

## 2020-11-29 DIAGNOSIS — R2689 Other abnormalities of gait and mobility: Secondary | ICD-10-CM | POA: Diagnosis not present

## 2020-11-29 DIAGNOSIS — R7989 Other specified abnormal findings of blood chemistry: Secondary | ICD-10-CM | POA: Diagnosis not present

## 2020-11-29 DIAGNOSIS — M6389 Disorders of muscle in diseases classified elsewhere, multiple sites: Secondary | ICD-10-CM | POA: Diagnosis not present

## 2020-11-29 DIAGNOSIS — R338 Other retention of urine: Secondary | ICD-10-CM | POA: Diagnosis not present

## 2020-11-29 DIAGNOSIS — M79662 Pain in left lower leg: Secondary | ICD-10-CM | POA: Diagnosis not present

## 2020-11-29 LAB — BASIC METABOLIC PANEL
BUN: 19 (ref 4–21)
CO2: 25 — AB (ref 13–22)
Chloride: 107 (ref 99–108)
Creatinine: 1 (ref 0.6–1.3)
Glucose: 95
Potassium: 4.3 (ref 3.4–5.3)
Sodium: 142 (ref 137–147)

## 2020-11-29 LAB — CBC AND DIFFERENTIAL
HCT: 36 — AB (ref 41–53)
Hemoglobin: 11.9 — AB (ref 13.5–17.5)
Platelets: 199 (ref 150–399)
WBC: 5.6

## 2020-11-29 LAB — CBC: RBC: 4.19 (ref 3.87–5.11)

## 2020-11-29 LAB — COMPREHENSIVE METABOLIC PANEL: Calcium: 8.3 — AB (ref 8.7–10.7)

## 2020-11-29 LAB — PROTIME-INR: INR: 3.4 — AB (ref 0.9–1.1)

## 2020-11-29 NOTE — Progress Notes (Signed)
Provider:  Veleta Miners MD Location:   [place] Room Number: 662 HUTML of Service:  SNF (31)  PCP: Plotnikov, Evie Lacks, MD Patient Care Team: Clarence Anger, MD as PCP - General (Internal Medicine) Clarence Hough, MD as Consulting Physician (Ophthalmology) Clarence Matin, MD as Consulting Physician (Dermatology) Clarence Kras, MD as Referring Physician (Endocrinology) Clarence Napoleon, MD as Medical Oncologist (Oncology) Clarence Hopkins, [facility] (Pharmacist) Clarence Hopkins, [facility] as Pharmacist (Pharmacist)  Extended Emergency Contact Information Primary Emergency Contact: Clarence Hopkins Address: [place], [place] Phone: [phone] Work Phone: [phone] Mobile Phone: [phone] Relation: Spouse  Code Status: Full Code Goals of Care: Advanced Directive information Advanced Directives 11/29/2020  Does Patient Have a Medical Advance Directive? No  Type of Advance Directive -  Does patient want to make changes to medical advance directive? -  Copy of [place] in Chart? -  Would patient like information on creating a medical advance directive? No - Patient declined      Chief Complaint  Patient presents with   New Admit To SNF    HPI: [name] seen today for admission to SNF for short term Rehab  Admitted to the hospital from 10/5-10/12 for Rectal Bleeding , Supra therapeutic INR and Severe Constipation  Patient has h/o Metastatic Thyroid cancer on Radioactive Iodide. Stays stable Follows with Clarence Hopkins Urinary retension Now has Foley Follows with Clarence Hopkins LLE wound after Excision for Skin cancer follows with Dermatology H/o PE and Protein C Def on Chronic Coumadin HLD,  BPH with Elevated PSA Came to ED with Hematochezia Rectal Spasm and urinary spasms Seen by GI recommended Smog  disimaction And then Analpram and  Diltizem Gel for 2 weeks  Feeling Much Better No rectal spasms or Bladder spasms Walking with no issue Wants to know when to go home   Past Medical History:  Diagnosis Date   Arthritis    Cancer ([place]) 2003   melanoma  L ear   GERD (gastroesophageal reflux disease)    Hypothyroidism    post op   Lung mass    Pulmonary embolism ([place]) 2005 & 2007   protein C deficiency   Past Surgical History:  Procedure Laterality Date   BRONCHIAL BRUSHINGS  07/15/2019   Procedure: BRONCHIAL BRUSHINGS;  Surgeon: Clarence Noel, MD;  Location: [place];  Service: Cardiopulmonary;;   BRONCHIAL WASHINGS  07/15/2019   Procedure: BRONCHIAL WASHINGS;  Surgeon: Clarence Noel, MD;  Location: [place];  Service: Cardiopulmonary;;   CARDIAC CATHETERIZATION     > 20 years ago " everything was okay"    CERVICAL FUSION      X 2; Clarence Hopkins   COLONOSCOPY     ENDOBRONCHIAL ULTRASOUND N/A 07/15/2019   Procedure: ENDOBRONCHIAL ULTRASOUND;  Surgeon: Clarence Noel, MD;  Location: [place];  Service: Cardiopulmonary;  Laterality: N/A;   FINE NEEDLE ASPIRATION  07/15/2019   Procedure: FINE NEEDLE ASPIRATION (FNA) LINEAR;  Surgeon: Clarence Noel, MD;  Location: [place];  Service: Cardiopulmonary;;   FINGER ARTHROPLASTY Right 04/17/2013   Procedure: IMPLANT ARTHROPLASTY RIGHT INDEX;  Surgeon: Clarence Sickle., MD;  Location: [place];  Service: Orthopedics;  Laterality: Right;   LUMBAR LAMINECTOMY  2010   Clarence Hopkins   MELANOMA EXCISION  2003   L ear   THYROIDECTOMY  1971   cytology indefinite   TONSILLECTOMY     TOTAL KNEE ARTHROPLASTY  2004   left   VIDEO BRONCHOSCOPY N/A 07/15/2019   Procedure: VIDEO BRONCHOSCOPY WITHOUT FLUORO;  Surgeon: Clarence Noel, MD;  Location: [place];  Service: Cardiopulmonary;  Laterality: N/A;   WISDOM TOOTH EXTRACTION      reports that he quit smoking about 27 years ago. His smoking use included cigarettes. He has a 30.75 pack-year smoking  history. He has never used smokeless tobacco. He reports current alcohol use. He reports that he does not use drugs. Social History   Socioeconomic History   Marital status: Married    Spouse name: Not on file   Number of children: 4   Years of education: 15   Highest education level: Not on file  Occupational History   Not on file  Tobacco Use   Smoking status: Former    Packs/day: 0.75    Years: 41.00    Pack years: 30.75    Types: Cigarettes    Quit date: 02/13/1993    Years since quitting: 27.8   Smokeless tobacco: Never   Tobacco comments:    smoked 1956-1995, up to 1/2 ppd  Vaping Use   Vaping Use: Never used  Substance and Sexual Activity   Alcohol use: Yes    Comment:  Vodka 2 oz/ night   Drug use: No   Sexual activity: Not on file  Other Topics Concern   Not on file  Social History Narrative   Fun: Plays golf   Denies abuse and feels safe at home   Social Determinants of Health   Financial Resource Strain: Not on file  Food Insecurity: Not on file  Transportation Needs: Not on file  Physical Activity: Not on file  Stress: Not on file  Social Connections: Not on file  Intimate Partner Violence: Not on file    Functional Status Survey:    Family History  Problem Relation Age of Onset   Breast cancer Mother    Alzheimer's disease Father    Breast cancer Maternal Grandmother    Breast cancer Sister    Cancer Sister    Diabetes Neg Hx    Stroke Neg Hx    Heart disease Neg Hx     Health Maintenance  Topic Date Due   TETANUS/TDAP  Never done   Zoster Vaccines- Shingrix (1 of 2) Never done   INFLUENZA VACCINE  09/13/2020   COVID-19 Vaccine (5 - Booster for [place] series) 11/05/2020   HPV VACCINES  Aged Out    No Known Allergies  Allergies as of 11/29/2020   No Known Allergies      Medication List        Accurate as of November 29, 2020  9:43 AM. If you have any questions, ask your nurse or doctor.          STOP taking these  medications    triamcinolone cream 0.1 % Commonly known as: KENALOG Stopped by: Clarence Dad, MD   warfarin 5 MG tablet Commonly known as: COUMADIN Stopped by: Clarence Dad, MD       TAKE these medications    bisacodyl 10 MG suppository Commonly known as: Dulcolax Place 1 suppository (10 mg total) rectally as needed for moderate constipation.   bisacodyl 5 MG EC tablet Commonly known as: DULCOLAX Take 1 tablet (5 mg total) by mouth daily.   diltiazem 2 % Gel Apply 1 application topically 3 (three) times  daily. apply a small amount inside the anal opening and to the external anal area tid for 2 weeks   finasteride 5 MG tablet Commonly known as: PROSCAR Take 5 mg by mouth daily.   Gemtesa 75 MG Tabs Generic drug: Vibegron Take 75 mg by mouth daily.   hydrocortisone-pramoxine 2.5-1 % rectal cream Commonly known as: ANALPRAM-HC Place rectally 4 (four) times daily. Apply to anus and 1 inch into anus   levothyroxine 175 MCG tablet Commonly known as: SYNTHROID Take 175 mcg by mouth daily before breakfast.   melatonin 5 MG Tabs Take 5 mg by mouth at bedtime.   polyethylene glycol 17 g packet Commonly known as: MIRALAX / GLYCOLAX Take 17 g by mouth 2 (two) times daily.   polyvinyl alcohol 1.4 % ophthalmic solution Commonly known as: LIQUIFILM TEARS Place 1 drop into both eyes as needed for dry eyes.   senna-docusate 8.6-50 MG tablet Commonly known as: Senokot-S Take 1 tablet by mouth 2 (two) times daily.   tamsulosin 0.4 MG Caps capsule Commonly known as: FLOMAX TAKE 1 CAPSULE BY MOUTH DAILY AFTER SUPPER        Review of Systems  Constitutional: Negative.   HENT: Negative.    Respiratory: Negative.    Cardiovascular:  Positive for leg swelling.  Gastrointestinal: Negative.   Genitourinary: Negative.   Musculoskeletal: Negative.   Skin:  Positive for wound.  Neurological:  Positive for weakness.  Psychiatric/Behavioral: Negative.     Vitals:    11/29/20 0936  BP: 130/63  Pulse: 69  Resp: 20  Temp: 97.7 F (36.5 C)  SpO2: 95%  Weight: 207 lb (93.9 kg)  Height: 6' 3.5" (1.918 m)   Body mass index is 25.53 kg/m. Physical Exam Vitals reviewed.  Constitutional:      Appearance: Normal appearance.  HENT:     Head: Normocephalic.     Nose: Nose normal.     Mouth/Throat:     Mouth: Mucous membranes are moist.     Pharynx: Oropharynx is clear.  Eyes:     Pupils: Pupils are equal, round, and reactive to light.  Cardiovascular:     Rate and Rhythm: Normal rate and regular rhythm.     Pulses: Normal pulses.  Pulmonary:     Effort: Pulmonary effort is normal.     Breath sounds: Normal breath sounds.  Abdominal:     General: Abdomen is flat. Bowel sounds are normal. There is no distension.     Palpations: Abdomen is soft.     Tenderness: There is no abdominal tenderness.  Musculoskeletal:     Cervical back: Neck supple.     Comments: Mild swelling Bilateral Left More then Right    Skin:    General: Skin is warm.     Comments: Has Non Healing wound in his Left LE With clear Margins Some Slough but mostly clear base No Signs of Infection  Neurological:     General: No focal deficit present.     Mental Status: He is alert and oriented to person, place, and time.  Psychiatric:        Mood and Affect: Mood normal.        Thought Content: Thought content normal.    Labs reviewed: Basic Metabolic Panel: Recent Labs    11/18/20 0149 11/19/20 0554 11/23/20 0334  NA 139 136 137  K 3.8 4.5 4.1  CL 107 103 100  CO2 25 28 31   GLUCOSE 85 101* 99  BUN 13 17 15  CREATININE 1.05 1.11 1.17  CALCIUM 8.4* 8.8* 8.4*   Liver Function Tests: Recent Labs    11/15/20 2050 11/17/20 2115 11/18/20 0149  AST 22 29 23   ALT 16 17 15   ALKPHOS 97 109 90  BILITOT 0.8 1.2 0.9  PROT 7.4 7.3 6.2*  ALBUMIN 4.1 3.8 3.1*   Recent Labs    10/30/20 1334 11/15/20 2050  LIPASE 17 14   No results for input(s): AMMONIA in the last  8760 hours. CBC: Recent Labs    10/30/20 1334 11/15/20 2050 11/17/20 2115 11/18/20 0145 11/18/20 1653 11/18/20 1832 11/20/20 0356 11/21/20 0352 11/22/20 0403 11/23/20 0334  WBC 8.9 7.8 7.8   < > 5.6 7.2  --   --   --  4.9  NEUTROABS 7.4 6.5 5.7  --   --   --   --   --   --   --   HGB 14.2 12.4* 12.6*   < > 11.7* 12.1*   < > 11.3* 11.9* 11.8*  HCT 44.6 39.7 40.1   < > 37.5* 39.1   < > 36.0* 36.6* 37.0*  MCV 88.3 89.4 89.9   < > 90.1 90.9  --   --   --  89.8  PLT 203 199 198   < > 196 193  --   --   --  208   < > = values in this interval not displayed.   Cardiac Enzymes: No results for input(s): CKTOTAL, CKMB, CKMBINDEX, TROPONINI in the last 8760 hours. BNP: Invalid input(s): POCBNP No results found for: HGBA1C Lab Results  Component Value Date   TSH 15.168 (H) 11/23/2020   Lab Results  Component Value Date   VITAMINB12 1,126 (H) 06/03/2019   No results found for: FOLATE No results found for: IRON, TIBC, FERRITIN  Imaging and Procedures obtained prior to SNF admission: No results found.  Assessment/Plan  Rectal bleeding Combination on Manual Disimapction and Suprtheraputic INR Now on Miralax and Dulcolax Says his Bowls are moving well and No Rectal Bleeding Possible Rectal Fissue Analapram and Diltiazem  Slow transit constipation Continue Mirlax and Dulcolax Metastatic Thyroid cancer (HCC)with Lung Nodules Follows with Clarence Hopkins and Clarence Hartford Poli Endocrinologist TSH was high will not change anything  Protein C deficiency [facility]) with h/o PE On Chronic Coumadin INR pending  Urinary retention Has Chronic Foley . No More Spasms Follows with Clarence Jeffie Pollock Also on Flomax and Proscar Gemtisa  Paroxysmal atrial fibrillation [facility]) One episode in the hospital Already on Coumadin Impaired cognition Noticed by the staff Repeats himself MMSE pending D/w him about the care with Chronic Foley and Wound He will stay for few more days here in Rehab  Family/ staff  Communication:   Labs/tests ordered:

## 2020-11-30 DIAGNOSIS — K625 Hemorrhage of anus and rectum: Secondary | ICD-10-CM | POA: Diagnosis not present

## 2020-11-30 DIAGNOSIS — R2689 Other abnormalities of gait and mobility: Secondary | ICD-10-CM | POA: Diagnosis not present

## 2020-11-30 DIAGNOSIS — R2681 Unsteadiness on feet: Secondary | ICD-10-CM | POA: Diagnosis not present

## 2020-11-30 DIAGNOSIS — R338 Other retention of urine: Secondary | ICD-10-CM | POA: Diagnosis not present

## 2020-11-30 DIAGNOSIS — I480 Paroxysmal atrial fibrillation: Secondary | ICD-10-CM | POA: Diagnosis not present

## 2020-12-01 DIAGNOSIS — I480 Paroxysmal atrial fibrillation: Secondary | ICD-10-CM | POA: Diagnosis not present

## 2020-12-01 DIAGNOSIS — M6389 Disorders of muscle in diseases classified elsewhere, multiple sites: Secondary | ICD-10-CM | POA: Diagnosis not present

## 2020-12-01 DIAGNOSIS — L0889 Other specified local infections of the skin and subcutaneous tissue: Secondary | ICD-10-CM | POA: Diagnosis not present

## 2020-12-01 DIAGNOSIS — R278 Other lack of coordination: Secondary | ICD-10-CM | POA: Diagnosis not present

## 2020-12-01 DIAGNOSIS — R338 Other retention of urine: Secondary | ICD-10-CM | POA: Diagnosis not present

## 2020-12-01 DIAGNOSIS — R2689 Other abnormalities of gait and mobility: Secondary | ICD-10-CM | POA: Diagnosis not present

## 2020-12-01 DIAGNOSIS — K625 Hemorrhage of anus and rectum: Secondary | ICD-10-CM | POA: Diagnosis not present

## 2020-12-01 DIAGNOSIS — M79662 Pain in left lower leg: Secondary | ICD-10-CM | POA: Diagnosis not present

## 2020-12-01 DIAGNOSIS — R2681 Unsteadiness on feet: Secondary | ICD-10-CM | POA: Diagnosis not present

## 2020-12-01 DIAGNOSIS — R41840 Attention and concentration deficit: Secondary | ICD-10-CM | POA: Diagnosis not present

## 2020-12-02 DIAGNOSIS — R2681 Unsteadiness on feet: Secondary | ICD-10-CM | POA: Diagnosis not present

## 2020-12-02 DIAGNOSIS — R338 Other retention of urine: Secondary | ICD-10-CM | POA: Diagnosis not present

## 2020-12-02 DIAGNOSIS — R2689 Other abnormalities of gait and mobility: Secondary | ICD-10-CM | POA: Diagnosis not present

## 2020-12-02 DIAGNOSIS — I480 Paroxysmal atrial fibrillation: Secondary | ICD-10-CM | POA: Diagnosis not present

## 2020-12-02 DIAGNOSIS — K625 Hemorrhage of anus and rectum: Secondary | ICD-10-CM | POA: Diagnosis not present

## 2020-12-03 ENCOUNTER — Encounter: Payer: Self-pay | Admitting: Adult Health

## 2020-12-03 ENCOUNTER — Non-Acute Institutional Stay (SKILLED_NURSING_FACILITY): Payer: Medicare Other | Admitting: Adult Health

## 2020-12-03 DIAGNOSIS — R339 Retention of urine, unspecified: Secondary | ICD-10-CM

## 2020-12-03 DIAGNOSIS — K625 Hemorrhage of anus and rectum: Secondary | ICD-10-CM

## 2020-12-03 DIAGNOSIS — R338 Other retention of urine: Secondary | ICD-10-CM | POA: Diagnosis not present

## 2020-12-03 DIAGNOSIS — K602 Anal fissure, unspecified: Secondary | ICD-10-CM | POA: Diagnosis not present

## 2020-12-03 DIAGNOSIS — R2681 Unsteadiness on feet: Secondary | ICD-10-CM | POA: Diagnosis not present

## 2020-12-03 DIAGNOSIS — D6859 Other primary thrombophilia: Secondary | ICD-10-CM | POA: Diagnosis not present

## 2020-12-03 DIAGNOSIS — H01002 Unspecified blepharitis right lower eyelid: Secondary | ICD-10-CM

## 2020-12-03 DIAGNOSIS — S81802D Unspecified open wound, left lower leg, subsequent encounter: Secondary | ICD-10-CM | POA: Diagnosis not present

## 2020-12-03 DIAGNOSIS — C73 Malignant neoplasm of thyroid gland: Secondary | ICD-10-CM | POA: Diagnosis not present

## 2020-12-03 DIAGNOSIS — K5901 Slow transit constipation: Secondary | ICD-10-CM

## 2020-12-03 DIAGNOSIS — I480 Paroxysmal atrial fibrillation: Secondary | ICD-10-CM | POA: Diagnosis not present

## 2020-12-03 DIAGNOSIS — R413 Other amnesia: Secondary | ICD-10-CM | POA: Diagnosis not present

## 2020-12-03 DIAGNOSIS — R2689 Other abnormalities of gait and mobility: Secondary | ICD-10-CM | POA: Diagnosis not present

## 2020-12-03 MED ORDER — DILTIAZEM GEL 2 %: 1.0000 "application " | Freq: Three times a day (TID) | CUTANEOUS | 0 refills | Status: DC

## 2020-12-03 MED ORDER — HYDROCORT-PRAMOXINE (PERIANAL) 2.5-1 % EX CREA: TOPICAL_CREAM | Freq: Every day | CUTANEOUS | 0 refills | Status: AC | PRN

## 2020-12-03 NOTE — Progress Notes (Signed)
Provider:  [name], NP Location:   [place] Room Number: 469 GEXBM of Service:  SNF (31)  PCP: Plotnikov, Evie Lacks, MD Patient Care Team: Cassandria Anger, MD as PCP - General (Internal Medicine) Shon Hough, MD as Consulting Physician (Ophthalmology) [name], MD as Consulting Physician (Dermatology) Oneita Kras, MD as Referring Physician (Endocrinology) [name], MD as Medical Oncologist (Oncology) Szabat, Darnelle Maffucci, [facility] (Pharmacist) Szabat, Darnelle Maffucci, [facility] as Pharmacist (Pharmacist)  Extended Emergency Contact Information Primary Emergency Contact: [name],[name] Address: [place], [place] Phone: [phone] Work Phone: [phone] Mobile Phone: [phone] Relation: Spouse  Code Status: Full Code Goals of Care: Advanced Directive information Advanced Directives 12/03/2020  Does Patient Have a Medical Advance Directive? No  Type of Advance Directive -  Does patient want to make changes to medical advance directive? -  Copy of [place] in Chart? -  Would patient like information on creating a medical advance directive? No - Patient declined      Chief Complaint  Patient presents with   Discharge Note    Discharge from Rehab.    HPI: [name] seen today for discharge from rehab.   PMH significant for thyroid cancer status post thyroidectomy/radioactive iodine with mets,  PE, protein C deficiency, skin cancer, HLD, BPH. He came to the ER with rectal bleeding and INR was 4.8.  He was given vitamin K in the ER for reversal and is now back on Coumadin at 5 mg daily INR 2.3 on 11/24/20.  He was having rectal pain and was disimpacted due to constipation, also received enemas. Found to have proctitis and rectal fissure and prescribed hydrocortisone and diltiazem topical.   He is also having urinary retention and  bladder spasms and has an indwelling Foley catheter that was placed in sept in the ER for retention. They tried to take the cath out and do I /O caths but this did not help with bladder spasms. Notes indicated he has an elevated PSA. Followed by urology.   Also of note he has pulmonary nodules in both lungs on CT 10/21/20 c/w metastatic disease. Seen by Dr. Hartford Poli at [facility] for metastatic thyroid ca.   During his stay he has made functional gains and is ready for discharge. He is ambulatory with a walker independently and is able to perform ADLs.   He has wound to the LLE from a SCC with skin graft. Seen by derm during his stay and they recommended continuing with dressing changes daily and adding mupirocin.   Coumadin held x 1 day on 10/17 and resumed at lower dose of 2.5 mg   Bowels moved 10/19  MMSE 26/30 11/29/20 with 1/3 on recall 4/5 in orientation. Poor sentence and pentagon drawing.    Hgb 11.9, INR 3.36 11/29/20 Past Medical History:  Diagnosis Date   Arthritis    Cancer ([place]) 2003   melanoma  L ear   GERD (gastroesophageal reflux disease)    Hypothyroidism    post op   Lung mass    Pulmonary embolism ([place]) 2005 & 2007   protein C deficiency   Past Surgical History:  Procedure Laterality Date   BRONCHIAL BRUSHINGS  07/15/2019   Procedure: BRONCHIAL BRUSHINGS;  Surgeon: Rigoberto Noel, MD;  Location: [place];  Service: Cardiopulmonary;;   BRONCHIAL WASHINGS  07/15/2019   Procedure:  BRONCHIAL WASHINGS;  Surgeon: Rigoberto Noel, MD;  Location: [facility] ENDOSCOPY;  Service: Cardiopulmonary;;   CARDIAC CATHETERIZATION     > 20 years ago " everything was okay"    CERVICAL FUSION      X 2; Dr Vertell Limber   COLONOSCOPY     ENDOBRONCHIAL ULTRASOUND N/A 07/15/2019   Procedure: ENDOBRONCHIAL ULTRASOUND;  Surgeon: Rigoberto Noel, MD;  Location: [place];  Service: Cardiopulmonary;  Laterality: N/A;   FINE NEEDLE ASPIRATION  07/15/2019   Procedure: FINE NEEDLE ASPIRATION (FNA) LINEAR;   Surgeon: Rigoberto Noel, MD;  Location: [place];  Service: Cardiopulmonary;;   FINGER ARTHROPLASTY Right 04/17/2013   Procedure: IMPLANT ARTHROPLASTY RIGHT INDEX;  Surgeon: Cammie Sickle., MD;  Location: [place];  Service: Orthopedics;  Laterality: Right;   LUMBAR LAMINECTOMY  2010   Dr Becky Sax   MELANOMA EXCISION  2003   L ear   THYROIDECTOMY  1971   cytology indefinite   TONSILLECTOMY     TOTAL KNEE ARTHROPLASTY  2004   left   VIDEO BRONCHOSCOPY N/A 07/15/2019   Procedure: VIDEO BRONCHOSCOPY WITHOUT FLUORO;  Surgeon: Rigoberto Noel, MD;  Location: [place];  Service: Cardiopulmonary;  Laterality: N/A;   WISDOM TOOTH EXTRACTION      reports that he quit smoking about 27 years ago. His smoking use included cigarettes. He has a 30.75 pack-year smoking history. He has never used smokeless tobacco. He reports current alcohol use. He reports that he does not use drugs. Social History   Socioeconomic History   Marital status: Married    Spouse name: Not on file   Number of children: 4   Years of education: 15   Highest education level: Not on file  Occupational History   Not on file  Tobacco Use   Smoking status: Former    Packs/day: 0.75    Years: 41.00    Pack years: 30.75    Types: Cigarettes    Quit date: 02/13/1993    Years since quitting: 27.8   Smokeless tobacco: Never   Tobacco comments:    smoked 1956-1995, up to 1/2 ppd  Vaping Use   Vaping Use: Never used  Substance and Sexual Activity   Alcohol use: Yes    Comment:  Vodka 2 oz/ night   Drug use: No   Sexual activity: Not on file  Other Topics Concern   Not on file  Social History Narrative   Fun: Plays golf   Denies abuse and feels safe at home   Social Determinants of Health   Financial Resource Strain: Not on file  Food Insecurity: Not on file  Transportation Needs: Not on file  Physical Activity: Not on file  Stress: Not on file  Social Connections: Not on file  Intimate  Partner Violence: Not on file    Functional Status Survey:    Family History  Problem Relation Age of Onset   Breast cancer Mother    Alzheimer's disease Father    Breast cancer Maternal Grandmother    Breast cancer Sister    Cancer Sister    Diabetes Neg Hx    Stroke Neg Hx    Heart disease Neg Hx     Health Maintenance  Topic Date Due   TETANUS/TDAP  Never done   Zoster Vaccines- Shingrix (1 of 2) Never done   COVID-19 Vaccine (5 - Booster for Pfizer series) 08/30/2020   INFLUENZA VACCINE  09/13/2020   Pneumonia Vaccine 65+ Years  old  Completed   HPV VACCINES  Aged Out    No Known Allergies  Allergies as of 12/03/2020   No Known Allergies      Medication List        Accurate as of December 03, 2020 10:22 AM. If you have any questions, ask your nurse or doctor.          bisacodyl 10 MG suppository Commonly known as: Dulcolax Place 1 suppository (10 mg total) rectally as needed for moderate constipation.   bisacodyl 5 MG EC tablet Commonly known as: DULCOLAX Take 1 tablet (5 mg total) by mouth daily.   diltiazem 2 % Gel Apply 1 application topically 3 (three) times daily. apply a small amount inside the anal opening and to the external anal area tid for 2 weeks   finasteride 5 MG tablet Commonly known as: PROSCAR Take 5 mg by mouth daily.   Gemtesa 75 MG Tabs Generic drug: Vibegron Take 75 mg by mouth daily.   hydrocortisone 1 % lotion Apply 1 application topically 2 (two) times daily.   hydrocortisone-pramoxine 2.5-1 % rectal cream Commonly known as: ANALPRAM-HC Place rectally 4 (four) times daily. Apply to anus and 1 inch into anus   levothyroxine 175 MCG tablet Commonly known as: SYNTHROID Take 175 mcg by mouth daily before breakfast.   melatonin 5 MG Tabs Take 5 mg by mouth at bedtime.   mupirocin ointment 2 % Commonly known as: BACTROBAN Apply 1 application topically daily.   polyethylene glycol 17 g packet Commonly known as:  MIRALAX / GLYCOLAX Take 17 g by mouth 2 (two) times daily.   polyvinyl alcohol 1.4 % ophthalmic solution Commonly known as: LIQUIFILM TEARS Place 1 drop into both eyes as needed for dry eyes.   senna-docusate 8.6-50 MG tablet Commonly known as: Senokot-S Take 1 tablet by mouth 2 (two) times daily.   tamsulosin 0.4 MG Caps capsule Commonly known as: FLOMAX TAKE 1 CAPSULE BY MOUTH DAILY AFTER SUPPER   warfarin 2.5 MG tablet Commonly known as: COUMADIN Take as directed by the anticoagulation clinic. If you are unsure how to take this medication, talk to your nurse or doctor. Original instructions: Take 2.5 mg by mouth daily.        Review of Systems  Constitutional:  Negative for activity change, appetite change, chills, diaphoresis, fatigue, fever and unexpected weight change.  HENT:  Negative for congestion.   Eyes:  Positive for discharge and redness. Negative for visual disturbance.  Respiratory:  Negative for cough, shortness of breath, wheezing and stridor.   Cardiovascular:  Positive for leg swelling. Negative for chest pain and palpitations.  Gastrointestinal:  Negative for abdominal distention, abdominal pain, anal bleeding, blood in stool, constipation, diarrhea, rectal pain and vomiting.  Genitourinary:  Negative for difficulty urinating, dysuria, flank pain, frequency, hematuria, penile discharge and penile pain.  Musculoskeletal:  Positive for gait problem. Negative for arthralgias, back pain, joint swelling and myalgias.  Skin:  Positive for wound.  Neurological:  Negative for dizziness, seizures, syncope, facial asymmetry, speech difficulty, weakness and headaches.  Hematological:  Negative for adenopathy. Does not bruise/bleed easily.  Psychiatric/Behavioral:  Negative for agitation, behavioral problems and confusion.        Short term memory loss   Vitals:   12/03/20 0958  BP: (!) 152/69  Pulse: 77  Resp: 18  Temp: 98.3 F (36.8 C)  SpO2: 93%  Weight: 100  lb (45.4 kg)  Height: 6' 3.5" (1.918 m)   Body mass  index is 12.33 kg/m. Physical Exam Constitutional:      General: He is not in acute distress.    Appearance: He is not diaphoretic.  HENT:     Head: Normocephalic and atraumatic.  Eyes:     General:        Right eye: Discharge present.        Left eye: No discharge.     Conjunctiva/sclera: Conjunctivae normal.     Pupils: Pupils are equal, round, and reactive to light.  Neck:     Thyroid: No thyromegaly.     Vascular: No JVD.     Trachea: No tracheal deviation.  Cardiovascular:     Rate and Rhythm: Normal rate and regular rhythm.     Heart sounds: No murmur heard. Pulmonary:     Effort: Pulmonary effort is normal. No respiratory distress.     Breath sounds: Normal breath sounds. No wheezing.  Abdominal:     General: Bowel sounds are normal. There is no distension.     Palpations: Abdomen is soft.     Tenderness: There is no abdominal tenderness.  Genitourinary:    Comments: Foley with clear yellow urine  Musculoskeletal:     Comments: BLE ankle edema +1  Lymphadenopathy:     Cervical: No cervical adenopathy.  Skin:    General: Skin is warm and dry.     Comments: LLE wound covered with dresing CDI  Neurological:     Mental Status: He is alert and oriented to person, place, and time.     Cranial Nerves: No cranial nerve deficit.    Labs reviewed: Basic Metabolic Panel: Recent Labs    11/18/20 0149 11/19/20 0554 11/23/20 0334 11/29/20 0000  NA 139 136 137 142  K 3.8 4.5 4.1 4.3  CL 107 103 100 107  CO2 25 28 31  25*  GLUCOSE 85 101* 99  --   BUN 13 17 15 19   CREATININE 1.05 1.11 1.17 1.0  CALCIUM 8.4* 8.8* 8.4* 8.3*   Liver Function Tests: Recent Labs    11/15/20 2050 11/17/20 2115 11/18/20 0149  AST 22 29 23   ALT 16 17 15   ALKPHOS 97 109 90  BILITOT 0.8 1.2 0.9  PROT 7.4 7.3 6.2*  ALBUMIN 4.1 3.8 3.1*   Recent Labs    10/30/20 1334 11/15/20 2050  LIPASE 17 14   No results for input(s):  AMMONIA in the last 8760 hours. CBC: Recent Labs    10/30/20 1334 11/15/20 2050 11/17/20 2115 11/18/20 0145 11/18/20 1653 11/18/20 1832 11/20/20 0356 11/22/20 0403 11/23/20 0334 11/29/20 0000  WBC 8.9 7.8 7.8   < > 5.6 7.2  --   --  4.9 5.6  NEUTROABS 7.4 6.5 5.7  --   --   --   --   --   --   --   HGB 14.2 12.4* 12.6*   < > 11.7* 12.1*   < > 11.9* 11.8* 11.9*  HCT 44.6 39.7 40.1   < > 37.5* 39.1   < > 36.6* 37.0* 36*  MCV 88.3 89.4 89.9   < > 90.1 90.9  --   --  89.8  --   PLT 203 199 198   < > 196 193  --   --  208 199   < > = values in this interval not displayed.   Cardiac Enzymes: No results for input(s): CKTOTAL, CKMB, CKMBINDEX, TROPONINI in the last 8760 hours. BNP: Invalid input(s): POCBNP No results found for:  HGBA1C Lab Results  Component Value Date   TSH 15.168 (H) 11/23/2020   Lab Results  Component Value Date   VITAMINB12 1,126 (H) 06/03/2019   No results found for: FOLATE No results found for: IRON, TIBC, FERRITIN  Imaging and Procedures obtained prior to SNF admission: No results found.  Assessment/Plan 1. Rectal bleeding Resolved with INR normalization   2. Rectal fissure Improved Ok to stop hydrocortisone Complete diltiazem 10/28  3. Slow transit constipation Improved  Continue Miralax and senokot   4. Urinary retention Has foley, [facility] staff teaching cath care.  Continue Gemtesa for bladder spasms Elevated PSA, needs to f/u with urology  apt 11/1  5. Thyroid cancer [facility]) Lab Results  Component Value Date   TSH 15.168 (H) 11/23/2020   On synthroid Metastatic, they have been following the lung nodules for several years S/p thyroidectomy and radioactive iodine Follows with Dr. Hartford Poli with [facility] Needs to f/u with Dr Alain Marion for TSH and/or Dr. Hartford Poli  6. Protein C deficiency ([place]) On coumadin 2.5 mg INR 10/24, [facility] to set up F/U with Dr. Marin Olp  7. Paroxysmal atrial fibrillation (HCC) Resolved, already on anticoagulation    8. Memory loss Mild, not sure if this is new and due to his recent hospitalizations His wife reports that his daughter will fil their pill box and helps with care and appointments I recommended that he not drive at this time.   9. LLE wound S/p skin ca removal Does not appear to be infected Continue with dressing changes and mupirocin  Staff taught resident and his wife how to do the dressing.   10. Bleparitis right lower lid Use Refresh 2 gtts bid for 7 days or until resolved.  Report if worsening or not improving.   Family/ staff Communication: discussed with the resident and his wife Mcneil Sober, also with nurse  Labs/tests ordered: INR 10/24 TSH in 2-4 weeks.   He is ready for discharge but will have homecare support and needs close f/u  Discharge review, assessment, plan, and coordination took >30 min

## 2020-12-06 DIAGNOSIS — R2689 Other abnormalities of gait and mobility: Secondary | ICD-10-CM | POA: Diagnosis not present

## 2020-12-06 DIAGNOSIS — K625 Hemorrhage of anus and rectum: Secondary | ICD-10-CM | POA: Diagnosis not present

## 2020-12-06 DIAGNOSIS — R338 Other retention of urine: Secondary | ICD-10-CM | POA: Diagnosis not present

## 2020-12-06 DIAGNOSIS — R2681 Unsteadiness on feet: Secondary | ICD-10-CM | POA: Diagnosis not present

## 2020-12-06 DIAGNOSIS — I480 Paroxysmal atrial fibrillation: Secondary | ICD-10-CM | POA: Diagnosis not present

## 2020-12-07 ENCOUNTER — Encounter: Payer: Self-pay | Admitting: Internal Medicine

## 2020-12-07 LAB — PROTIME-INR: INR: 1.7 — AB (ref 0.9–1.1)

## 2020-12-09 ENCOUNTER — Telehealth: Payer: Self-pay | Admitting: Internal Medicine

## 2020-12-09 ENCOUNTER — Telehealth: Payer: Medicare Other

## 2020-12-09 NOTE — Telephone Encounter (Signed)
[facility] called and states INR & PT was drawn for pt. States that pt. May not have been compliant with taking medications. Yesterday pt did agree to be med- managed. [facility] faxed over med- managed order that needs to be signed by provider.    Fax #- [phone]  Callback #- [phone]

## 2020-12-09 NOTE — Progress Notes (Deleted)
Chronic Care Management Pharmacy Note  12/09/2020 Name:  Clarence Hopkins MRN:  [id number] DOB:  [birth year]  Summary: - Patient reports that he has not yet started checking blood pressures - reports that he was in to see Dr. Hartford Poli yesterday -reports that blood pressure in office was 127/78  Recommendations/Changes made from today's visit: - No changes to medications at this time, patient will check blood pressure at least once weekly until next follow up (8 weeks) - patient to reach out to office should blood pressure be >130/80   Subjective: Clarence Hopkins is an 87 y.o. year old male who is a primary patient of Plotnikov, Evie Lacks, MD.  The CCM team was consulted for assistance with disease management and care coordination needs.    Engaged with patient by telephone for follow up visit in response to provider referral for pharmacy case management and/or care coordination services.   Consent to Services:  The patient was given the following information about Chronic Care Management services today, agreed to services, and gave verbal consent: 1. CCM service includes personalized support from designated clinical staff supervised by the primary care provider, including individualized plan of care and coordination with other care providers 2. 24/7 contact phone numbers for assistance for urgent and routine care needs. 3. Service will only be billed when office clinical staff spend 20 minutes or more in a month to coordinate care. 4. Only one practitioner may furnish and bill the service in a calendar month. 5.The patient may stop CCM services at any time (effective at the end of the month) by phone call to the office staff. 6. The patient will be responsible for cost sharing (co-pay) of up to 20% of the service fee (after annual deductible is met). Patient agreed to services and consent obtained.  Patient Care Team: Plotnikov, Evie Lacks, MD as PCP - General (Internal Medicine) Shon Hough, MD as  Consulting Physician (Ophthalmology) [name], MD as Consulting Physician (Dermatology) Oneita Kras, MD as Referring Physician (Endocrinology) [name], MD as Medical Oncologist (Oncology) Delice Bison Darnelle Maffucci, [facility] (Pharmacist) Tomasa Blase, [facility] as Pharmacist (Pharmacist)  Recent office visits: 08/04/2020 - Dr. Sarajane Jews - follow up for recent mild pneumonia - no changes at this time   Recent consult visits: 10/21/20 - Dr. Marin Olp - Oncology - f/u for metastatic thyroid cancer - responding to radioactive iodine, no changes to medications at this time    Hospital visits: 11/17/20-11/24/20 - Ad[facility] to [facility] - due to supratherapeutic INR and a rectal bleed  - discharged with bisacodyl , diltiazem 2%gel , hydrocortisone-pramoxine cream 11/15/20 - to ED due to urinary retention / constipation - fecal disimpaction performed - discharged on bowel regimen  10/31/20 - to ED due to complications with Foley Catheter - bleeding at the meatus - thought to be due to irritation - follow up with urology next day  10/30/20 - to ED due to urinary retention / lower abdominal pain - relieve with foley catheter - no changes to medications on discharge   Objective:  Lab Results  Component Value Date   CREATININE 1.0 11/29/2020   BUN 19 11/29/2020   GFR 60.20 06/03/2019   GFRNONAA 60 (L) 11/23/2020   GFRAA >60 10/21/2019   NA 142 11/29/2020   K 4.3 11/29/2020   CALCIUM 8.3 (A) 11/29/2020   CO2 25 (A) 11/29/2020   GLUCOSE 99 11/23/2020    Lab Results  Component Value Date/Time   GFR 60.20 06/03/2019 03:40  PM   GFR 68.40 03/14/2019 07:53 AM    Last diabetic Eye exam:  No results found for: HMDIABEYEEXA  Last diabetic Foot exam:  No results found for: HMDIABFOOTEX   Lab Results  Component Value Date   CHOL 141 03/14/2019   HDL 38.00 (L) 03/14/2019   LDLCALC 88 03/14/2019   LDLDIRECT 116.0 03/31/2013   TRIG 74.0 03/14/2019   CHOLHDL 4 03/14/2019    Hepatic Function Latest  Ref Rng & Units 11/18/2020 11/17/2020 11/15/2020  Total Protein 6.5 - 8.1 g/dL 6.2(L) 7.3 7.4  Albumin 3.5 - 5.0 g/dL 3.1(L) 3.8 4.1  AST 15 - 41 U/L _0 ALT 0 - 44 U/L _1 Alk Phosphatase 38 - 126 U/L 90 109 97  Total Bilirubin 0.3 - 1.2 mg/dL 0.9 1.2 0.8  Bilirubin, Direct 0.0 - 0.2 mg/dL - - 0.2    Lab Results  Component Value Date/Time   TSH 15.168 (H) 11/23/2020 03:34 AM   TSH 70.40 (A) 10/27/2020 11:11 AM   TSH 0.67 06/03/2019 03:40 PM   TSH 1.06 03/14/2019 07:53 AM   FREET4 1.25 (H) 11/23/2020 03:34 AM   FREET4 1.29 06/03/2019 03:40 PM    CBC Latest Ref Rng & Units 11/29/2020 11/23/2020 11/22/2020  WBC - 5.6 4.9 -  Hemoglobin 13.5 - 17.5 11.9(A) 11.8(L) 11.9(L)  Hematocrit 41 - 53 36(A) 37.0(L) 36.6(L)  Platelets 150 - 399 199 208 -    Lab Results  Component Value Date/Time   VD25OH 81.58 06/03/2019 03:40 PM   VD25OH 51.09 03/07/2018 03:07 PM    Clinical ASCVD: No  The ASCVD Risk score (Arnett DK, et al., 2019) failed to calculate for the following reasons:   The 2019 ASCVD risk score is only valid for ages 66 to 19    Depression screen PHQ 2/9 08/19/2019 03/10/2019 03/07/2018  Decreased Interest 0 0 0  Down, Depressed, Hopeless 0 0 0  PHQ - 2 Score 0 0 0  Altered sleeping - - -  Tired, decreased energy - - -  Change in appetite - - -  Feeling bad or failure about yourself  - - -  Trouble concentrating - - -  Moving slowly or fidgety/restless - - -  Suicidal thoughts - - -  PHQ-9 Score - - -  Difficult doing work/chores - - -  Some recent data might be hidden    Social History   Tobacco Use  Smoking Status Former   Packs/day: 0.75   Years: 41.00   Pack years: 30.75   Types: Cigarettes   Quit date: 02/13/1993   Years since quitting: 27.8  Smokeless Tobacco Never  Tobacco Comments   smoked 1956-1995, up to 1/2 ppd   BP Readings from Last 3 Encounters:  12/03/20 (!) 152/69  11/29/20 130/63  11/27/20 118/60   Pulse Readings from Last 3  Encounters:  12/03/20 77  11/29/20 69  11/27/20 64   Wt Readings from Last 3 Encounters:  12/03/20 100 lb (45.4 kg)  11/29/20 207 lb (93.9 kg)  11/26/20 207 lb (93.9 kg)   BMI Readings from Last 3 Encounters:  12/03/20 12.33 kg/m  11/29/20 25.53 kg/m  11/26/20 25.53 kg/m    Assessment/Interventions: Review of patient past medical history, allergies, medications, health status, including review of consultants reports, laboratory and other test data, was performed as part of comprehensive evaluation and provision of chronic care management services.   SDOH:  (Social Determinants of Health) assessments and interventions performed: Yes  SDOH Screenings   Alcohol Screen: Not on file  Depression (PHQ2-9): Not on file  Financial Resource Strain: Not on file  Food Insecurity: Not on file  Housing: Not on file  Physical Activity: Not on file  Social Connections: Not on file  Stress: Not on file  Tobacco Use: Medium Risk   Smoking Tobacco Use: Former   Smokeless Tobacco Use: Never   Passive Exposure: Not on file  Transportation Needs: Not on file    [place]  No Known Allergies  Medications Reviewed Today     Reviewed by [name], NP (Nurse Practitioner) on 12/03/20 at 1134  Med List Status: <None>   Medication Order Taking? Sig Documenting Provider Last Dose Status Informant  bisacodyl (DULCOLAX) 10 MG suppository [id number] Yes Place 1 suppository (10 mg total) rectally as needed for moderate constipation. Maudie Flakes, MD Taking Active   bisacodyl (DULCOLAX) 5 MG EC tablet [id number] Yes Take 1 tablet (5 mg total) by mouth daily. Bonnielee Haff, MD Taking Active   diltiazem 2 % GEL [id number]  Apply 1 application topically 3 (three) times daily for 7 days. apply a small amount inside the anal opening and to the external anal area tid for 2 weeks [name], NP  Active   finasteride (PROSCAR) 5 MG tablet [id number] Yes Take 5 mg by mouth daily. [provider] Taking Active Multiple Informants  hydrocortisone-pramoxine [facility]) 2.5-1 % rectal cream [id number]  Place rectally daily as needed. Apply to anus and 1 inch into anus if anal irritation [name], NP  Active   levothyroxine (SYNTHROID) 175 MCG tablet [id number] Yes Take 175 mcg by mouth daily before breakfast. [provider] Taking Active Multiple Informants           Med Note Alesia Banda, CHASIE F   Thu Nov 18, 2020  9:10 AM)    melatonin 5 MG TABS [id number] Yes Take 5 mg by mouth at bedtime. [provider] Taking Active   mupirocin ointment (BACTROBAN) 2 % [id number] Yes Apply 1 application topically daily. [provider] Taking Active   polyethylene glycol (MIRALAX / GLYCOLAX) 17 g packet [id number] Yes Take 17 g by mouth 2 (two) times daily. Bonnielee Haff, MD Taking Active   polyvinyl alcohol (LIQUIFILM TEARS) 1.4 % ophthalmic solution [id number] Yes Place 1 drop into both eyes as needed for dry eyes. [provider] Taking Active Multiple Informants  senna-docusate (SENOKOT-S) 8.6-50 MG tablet [id number] Yes Take 1 tablet by mouth 2 (two) times daily. Bonnielee Haff, MD Taking Active   tamsulosin [facility]) 0.4 MG CAPS capsule [id number] Yes TAKE 1 CAPSULE BY MOUTH DAILY AFTER SUPPER Plotnikov, Evie Lacks, MD Taking Active            Med Note Marcell Barlow   Thu Nov 18, 2020  9:10 AM)    Vibegron [facility]) 75 MG TABS [id number] Yes Take 75 mg by mouth daily. [provider] Taking Active Multiple Informants           Med Note Marcell Barlow   Thu Nov 18, 2020  9:11 AM)    warfarin (COUMADIN) 2.5 MG tablet [id number] Yes Take 2.5 mg by mouth daily. [provider] Taking Active             Patient Active Problem List   Diagnosis Date Noted   Rectal bleeding 11/18/2020   Pressure injury of skin 11/18/2020   Rectal bleed 11/17/2020   Multifocal pneumonia 08/04/2020   Right  lower lobe lung mass    DOE  (dyspnea on exertion) 06/03/2019   Near syncope 06/03/2019   Dysphagia 03/07/2018   Thyroid cancer ([place]) 02/10/2016   Medicare annual wellness visit, subsequent 02/02/2015   Hypertriglyceridemia 12/22/2013   Encounter for therapeutic drug monitoring 04/09/2013   History of basal cell cancer 10/30/2012   Long term (current) use of anticoagulants 05/23/2010   Protein C deficiency ([place]) 11/05/2009   Post-surgical hypothyroidism 01/13/2008   Elevated PSA measurement 01/13/2008   Melanoma ([place]) 01/13/2008   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 02/15/2007   PULMONARY EMBOLISM, HX OF 11/09/2006    Immunization History  Administered Date(s) Administered   Influenza Split 11/13/2012   Influenza Whole 11/13/2009, 11/14/2011   Influenza, High Dose Seasonal PF 11/23/2015, 11/27/2016, 11/27/2017, 11/14/2018   Influenza-Unspecified 12/08/2013, 12/07/2014   PFIZER Comirnaty(Gray Top)Covid-19 Tri-Sucrose Vaccine 07/05/2020   PFIZER(Purple Top)SARS-COV-2 Vaccination 03/02/2019, 03/19/2019, 10/23/2019   Pneumococcal Conjugate-13 03/05/2017   Pneumococcal Polysaccharide-23 02/04/2016   Zoster, Live 03/14/2006    Conditions to be addressed/monitored:  Hypertension, Hypothyroidism, BPH, and History of PE   There are no care plans that you recently modified to display for this patient.  Medication Assistance: None required.  Patient affirms current coverage meets needs.  Compliance/Adherence/Medication fill history: Care Gaps: Tetanus and Shingles vaccines   Patient's preferred pharmacy is:  [id number] - [address], [address] [place] [address] Phone: [phone] Fax: [phone]  [id number] [place] [place]Phone: [phone]: [phone]  Uses pill box? No -  takes all of his medications in the morning  Pt endorses 100% compliance  Care Plan and Follow Up Patient Decision:  Patient agrees to Care Plan and Follow-up.  Plan: Telephone follow up appointment with care management team member scheduled for:  8 weeks and The patient has been provided with contact information for the care management team and has been advised to call with any health related questions or concerns.   DTomasa Blase PharmD Clinical Pharmacist, [place]  Current chart prep = 11 minutes

## 2020-12-10 NOTE — Telephone Encounter (Signed)
I have not seen it faxes yet.  Thanks

## 2020-12-14 DIAGNOSIS — R338 Other retention of urine: Secondary | ICD-10-CM | POA: Diagnosis not present

## 2020-12-15 ENCOUNTER — Telehealth: Payer: Self-pay | Admitting: Internal Medicine

## 2020-12-15 NOTE — Telephone Encounter (Signed)
Nira Conn, clinic nurse at [facility] [phone]  Patient has been prescribed Vibegron (GEMTESA) 75 MG TABS it has a $200 copayment  Is there anything else that could be prescribed that is less expensive  If so, please send to the following pharmacy:  [place], [place] - [address] Phone:  [phone]  Fax:  [phone]

## 2020-12-15 NOTE — Telephone Encounter (Signed)
I have not prescribed this medication; please schedule an office visit to discuss Thanks

## 2020-12-16 NOTE — Telephone Encounter (Signed)
Notified Clarence Hopkins/ MD response. Clarence Hopkins states med was actually rx by provider in the rehab.  Pt has appt scheduled for 12/20/20 can discuss changing at that time.Clarence KitchenJohny Chess

## 2020-12-20 ENCOUNTER — Ambulatory Visit: Payer: Medicare Other | Admitting: Internal Medicine

## 2020-12-21 NOTE — Telephone Encounter (Signed)
Received VM from Nonda Lou, RN at [facility], who reports they will no longer be providing INR testing and that the pt has been told he will need to come to the office for future INR checks.    Contacted pt's wife who reports pt is still weak but is doing better. She did not want to make his apt for coumadin clinic because she does not know his schedule. She advised to contact pt on cell #. Advised if anything is needed to contact office. Collie Siad verbalized understanding.  Contacted pt and scheduled apt for 11/16 at [facility]. Advised if anything is needed to contact office. Pt verbalized understanding.

## 2020-12-21 NOTE — Telephone Encounter (Signed)
Researching pt chart to inquire if pt has been d/c from SNF.  Pt was d/c and [facility] is now performing INR testing. They faxed a result of 1.7 on 10/26 but this result was not forwarded to this nurse in the coumadin clinic.  Name on fax for nurse is Jerilee Hoh, at phone # [phone], and a fax # of [phone]. Result from 10/26 too late to dose pt with that result.  Tried to contact Heather at number provided but had to leave a VM.  Also tried to contact pt but had to leave VM.

## 2020-12-22 DIAGNOSIS — E890 Postprocedural hypothyroidism: Secondary | ICD-10-CM | POA: Diagnosis not present

## 2020-12-22 DIAGNOSIS — C799 Secondary malignant neoplasm of unspecified site: Secondary | ICD-10-CM | POA: Diagnosis not present

## 2020-12-22 DIAGNOSIS — C73 Malignant neoplasm of thyroid gland: Secondary | ICD-10-CM | POA: Diagnosis not present

## 2020-12-23 DIAGNOSIS — I480 Paroxysmal atrial fibrillation: Secondary | ICD-10-CM | POA: Diagnosis not present

## 2020-12-23 DIAGNOSIS — R278 Other lack of coordination: Secondary | ICD-10-CM | POA: Diagnosis not present

## 2020-12-23 DIAGNOSIS — R338 Other retention of urine: Secondary | ICD-10-CM | POA: Diagnosis not present

## 2020-12-23 DIAGNOSIS — M6389 Disorders of muscle in diseases classified elsewhere, multiple sites: Secondary | ICD-10-CM | POA: Diagnosis not present

## 2020-12-23 DIAGNOSIS — M79662 Pain in left lower leg: Secondary | ICD-10-CM | POA: Diagnosis not present

## 2020-12-23 DIAGNOSIS — K625 Hemorrhage of anus and rectum: Secondary | ICD-10-CM | POA: Diagnosis not present

## 2020-12-23 DIAGNOSIS — R41840 Attention and concentration deficit: Secondary | ICD-10-CM | POA: Diagnosis not present

## 2020-12-23 DIAGNOSIS — L0889 Other specified local infections of the skin and subcutaneous tissue: Secondary | ICD-10-CM | POA: Diagnosis not present

## 2020-12-27 ENCOUNTER — Ambulatory Visit (INDEPENDENT_AMBULATORY_CARE_PROVIDER_SITE_OTHER): Payer: Medicare Other

## 2020-12-27 DIAGNOSIS — Z0000 Encounter for general adult medical examination without abnormal findings: Secondary | ICD-10-CM | POA: Diagnosis not present

## 2020-12-27 NOTE — Progress Notes (Addendum)
I connected with Clarence Hopkins today by telephone and verified that I am speaking with the correct person using two identifiers. Location patient: home Location provider: work Persons participating in the virtual visit: patient, provider.   I discussed the limitations, risks, security and privacy concerns of performing an evaluation and management service by telephone and the availability of in person appointments. I also discussed with the patient that there may be a patient responsible charge related to this service. The patient expressed understanding and verbally consented to this telephonic visit.    Interactive audio and video telecommunications were attempted between this provider and patient, however failed, due to patient having technical difficulties OR patient did not have access to video capability.  We continued and completed visit with audio only.  Some vital signs may be absent or patient reported.   Time Spent with patient on telephone encounter: 40 minutes  Subjective:   Clarence Hopkins is a 87 y.o. male who presents for Medicare Annual/Subsequent preventive examination.  Review of Systems     Cardiac Risk Factors include: advanced age (>16men, >55 women);hypertension;male gender     Objective:    There were no vitals filed for this visit. There is no height or weight on file to calculate BMI.  Advanced Directives 12/27/2020 12/03/2020 11/29/2020 11/26/2020 11/18/2020 11/15/2020 10/31/2020  Does Patient Have a Medical Advance Directive? Yes No No No No No No  Type of Advance Directive Living will;Healthcare Power of Attorney - - - - - -  Does patient want to make changes to medical advance directive? No - Patient declined - - - - - -  Copy of [place] in Chart? No - copy requested - - - - - -  Would patient like information on creating a medical advance directive? - No - Patient declined No - Patient declined No - Patient declined No - Patient declined No  - Patient declined No - Patient declined    Current Medications (verified) Outpatient Encounter Medications as of 12/27/2020  Medication Sig   bisacodyl (DULCOLAX) 10 MG suppository Place 1 suppository (10 mg total) rectally as needed for moderate constipation.   bisacodyl (DULCOLAX) 5 MG EC tablet Take 1 tablet (5 mg total) by mouth daily.   diltiazem 2 % GEL Apply 1 application topically 3 (three) times daily for 7 days. apply a small amount inside the anal opening and to the external anal area tid for 2 weeks   finasteride (PROSCAR) 5 MG tablet Take 5 mg by mouth daily.   hydrocortisone-pramoxine (ANALPRAM-HC) 2.5-1 % rectal cream Place rectally daily as needed. Apply to anus and 1 inch into anus if anal irritation   levothyroxine (SYNTHROID) 175 MCG tablet Take 175 mcg by mouth daily before breakfast.   melatonin 5 MG TABS Take 5 mg by mouth at bedtime.   mupirocin ointment (BACTROBAN) 2 % Apply 1 application topically daily.   polyethylene glycol (MIRALAX / GLYCOLAX) 17 g packet Take 17 g by mouth 2 (two) times daily.   polyvinyl alcohol (LIQUIFILM TEARS) 1.4 % ophthalmic solution Place 1 drop into both eyes as needed for dry eyes.   senna-docusate (SENOKOT-S) 8.6-50 MG tablet Take 1 tablet by mouth 2 (two) times daily.   tamsulosin (FLOMAX) 0.4 MG CAPS capsule TAKE 1 CAPSULE BY MOUTH DAILY AFTER SUPPER   Vibegron (GEMTESA) 75 MG TABS Take 75 mg by mouth daily.   warfarin (COUMADIN) 2.5 MG tablet Take 2.5 mg by mouth daily.   No facility-administered  encounter medications on file as of 12/27/2020.    Allergies (verified) Patient has no known allergies.   History: Past Medical History:  Diagnosis Date   Arthritis    Cancer ([place]) 2003   melanoma  L ear   GERD (gastroesophageal reflux disease)    Hypothyroidism    post op   Lung mass    Pulmonary embolism ([place]) 2005 & 2007   protein C deficiency   Past Surgical History:  Procedure Laterality Date   BRONCHIAL BRUSHINGS   07/15/2019   Procedure: BRONCHIAL BRUSHINGS;  Surgeon: Rigoberto Noel, MD;  Location: [facility];  Service: Cardiopulmonary;;   BRONCHIAL WASHINGS  07/15/2019   Procedure: BRONCHIAL WASHINGS;  Surgeon: Rigoberto Noel, MD;  Location: [facility];  Service: Cardiopulmonary;;   CARDIAC CATHETERIZATION     > 20 years ago " everything was okay"    CERVICAL FUSION      X 2; Dr Vertell Limber   COLONOSCOPY     ENDOBRONCHIAL ULTRASOUND N/A 07/15/2019   Procedure: ENDOBRONCHIAL ULTRASOUND;  Surgeon: Rigoberto Noel, MD;  Location: [facility];  Service: Cardiopulmonary;  Laterality: N/A;   FINE NEEDLE ASPIRATION  07/15/2019   Procedure: FINE NEEDLE ASPIRATION (FNA) LINEAR;  Surgeon: Rigoberto Noel, MD;  Location: [facility];  Service: Cardiopulmonary;;   FINGER ARTHROPLASTY Right 04/17/2013   Procedure: IMPLANT ARTHROPLASTY RIGHT INDEX;  Surgeon: Cammie Sickle., MD;  Location: [facility];  Service: Orthopedics;  Laterality: Right;   LUMBAR LAMINECTOMY  2010   Dr Becky Sax   MELANOMA EXCISION  2003   L ear   THYROIDECTOMY  1971   cytology indefinite   TONSILLECTOMY     TOTAL KNEE ARTHROPLASTY  2004   left   VIDEO BRONCHOSCOPY N/A 07/15/2019   Procedure: VIDEO BRONCHOSCOPY WITHOUT FLUORO;  Surgeon: Rigoberto Noel, MD;  Location: [facility];  Service: Cardiopulmonary;  Laterality: N/A;   WISDOM TOOTH EXTRACTION     Family History  Problem Relation Age of Onset   Breast cancer Mother    Alzheimer's disease Father    Breast cancer Maternal Grandmother    Breast cancer Sister    Cancer Sister    Diabetes Neg Hx    Stroke Neg Hx    Heart disease Neg Hx    Social History   Socioeconomic History   Marital status: Married    Spouse name: Not on file   Number of children: 4   Years of education: 15   Highest education level: Not on file  Occupational History   Not on file  Tobacco Use   Smoking status: Former    Packs/day: 0.75    Years: 41.00    Pack years: 30.75    Types:  Cigarettes    Quit date: 02/13/1993    Years since quitting: 27.8   Smokeless tobacco: Never   Tobacco comments:    smoked 1956-1995, up to 1/2 ppd  Vaping Use   Vaping Use: Never used  Substance and Sexual Activity   Alcohol use: Yes    Comment:  Vodka 2 oz/ night   Drug use: No   Sexual activity: Not on file  Other Topics Concern   Not on file  Social History Narrative   Fun: Plays golf   Denies abuse and feels safe at home   Social Determinants of Health   Financial Resource Strain: Low Risk    Difficulty of Paying Living Expenses: Not hard at all  Food Insecurity: No Food  Insecurity   Worried About Charity fundraiser in the Last Year: Never true   [place] in the Last Year: Never true  Transportation Needs: No Transportation Needs   Lack of Transportation (Medical): No   Lack of Transportation (Non-Medical): No  Physical Activity: Inactive   Days of Exercise per Week: 0 days   Minutes of Exercise per Session: 0 min  Stress: No Stress Concern Present   Feeling of Stress : Not at all  Social Connections: Socially Integrated   Frequency of Communication with Friends and Family: More than three times a week   Frequency of Social Gatherings with Friends and Family: More than three times a week   Attends Religious Services: More than 4 times per year   Active Member of [employer] or Organizations: No   Attends Music therapist: More than 4 times per year   Marital Status: Married    Tobacco Counseling Counseling given: Not Answered Tobacco comments: smoked 1956-1995, up to 1/2 ppd   Clinical Intake:  Pre-visit preparation completed: Yes  Pain : No/denies pain     Nutritional Risks: None Diabetes: No  How often do you need to have someone help you when you read instructions, pamphlets, or other written materials from your doctor or pharmacy?: 1 - Never What is the last grade level you completed in school?: [employer], [employer] for 3 years  Diabetic?  no  Interpreter Needed?: No  Information entered by :: Lisette Abu, LPN   Activities of Daily Living In your present state of health, do you have any difficulty performing the following activities: 12/27/2020 11/18/2020  Hearing? N N  Vision? N N  Difficulty concentrating or making decisions? N N  Walking or climbing stairs? N Y  Comment - secondary to weakness  Dressing or bathing? N N  Doing errands, shopping? N N  Preparing Food and eating ? N -  Using the Toilet? N -  In the past six months, have you accidently leaked urine? N -  Do you have problems with loss of bowel control? N -  Managing your Medications? N -  Managing your Finances? N -  Housekeeping or managing your Housekeeping? N -  Some recent data might be hidden    Patient Care Team: Plotnikov, Evie Lacks, MD as PCP - General (Internal Medicine) Shon Hough, MD as Consulting Physician (Ophthalmology) [name], MD as Consulting Physician (Dermatology) Oneita Kras, MD as Referring Physician (Endocrinology) [name], MD as Medical Oncologist (Oncology) Szabat, Darnelle Maffucci, [facility] (Pharmacist) Delice Bison Darnelle Maffucci, [facility] as Pharmacist (Pharmacist)  Indicate any recent Medical Services you may have received from other than [facility] providers in the past year (date may be approximate).     Assessment:   This is a routine wellness examination for [place].  Hearing/Vision screen Hearing Screening - Comments:: Patient denied any hearing difficulty.   No hearing aids.  Vision Screening - Comments:: Patient wears only readers.  Eye exam done annually by: [facility].   Dietary issues and exercise activities discussed: Current Exercise Habits: The patient does not participate in regular exercise at present, Exercise limited by: Other - see comments (Just being released from hospital and rehab)   Goals Addressed   None   Depression Screen PHQ 2/9 Scores 12/27/2020 08/19/2019 03/10/2019  03/07/2018 03/05/2017 02/04/2016 02/02/2015  PHQ - 2 Score 0 0 0 0 0 0 0  PHQ- 9 Score - - - - 3 - -  Fall Risk Fall Risk  12/27/2020 08/19/2019 03/10/2019 03/07/2018 03/05/2017  Falls in the past year? 0 0 0 0 No  Number falls in past yr: 0 0 0 - -  Injury with Fall? 0 0 0 - -  Risk for fall due to : No Fall Risks No Fall Risks - - -  Follow up Falls evaluation completed Falls evaluation completed - Falls evaluation completed -    FALL RISK PREVENTION PERTAINING TO THE HOME:  Any stairs in or around the home? No  If so, are there any without handrails? No  Home free of loose throw rugs in walkways, pet beds, electrical cords, etc? Yes  Adequate lighting in your home to reduce risk of falls? Yes   ASSISTIVE DEVICES UTILIZED TO PREVENT FALLS:  Life alert? Yes  Use of a cane, walker or w/c? No  Grab bars in the bathroom? Yes  Shower chair or bench in shower? Yes  Elevated toilet seat or a handicapped toilet? Yes   TIMED UP AND GO:  Was the test performed? No .  Length of time to ambulate 10 feet: n/a sec.   Gait slow and steady without use of assistive device  Cognitive Function: Normal cognitive status assessed by direct observation by this Nurse Health Advisor. No abnormalities found.    MMSE - Mini Mental State Exam 03/05/2017  Orientation to time 5  Orientation to Place 5  Registration 3  Attention/ Calculation 5  Recall 1  Language- name 2 objects 2  Language- repeat 1  Language- follow 3 step command 3  Language- read & follow direction 1  Write a sentence 1  Copy design 1  Total score 28        Immunizations Immunization History  Administered Date(s) Administered   Influenza Split 11/13/2012   Influenza Whole 11/13/2009, 11/14/2011   Influenza, High Dose Seasonal PF 11/23/2015, 11/27/2016, 11/27/2017, 11/14/2018   Influenza-Unspecified 12/08/2013, 12/07/2014   PFIZER Comirnaty(Gray Top)Covid-19 Tri-Sucrose Vaccine 07/05/2020   PFIZER(Purple Top)SARS-COV-2  Vaccination 03/02/2019, 03/19/2019, 10/23/2019   Pneumococcal Conjugate-13 03/05/2017   Pneumococcal Polysaccharide-23 02/04/2016   Zoster, Live 03/14/2006    TDAP status: Due, Education has been provided regarding the importance of this vaccine. Advised may receive this vaccine at local pharmacy or Health Dept. Aware to provide a copy of the vaccination record if obtained from local pharmacy or Health Dept. Verbalized acceptance and understanding.  Flu Vaccine status: Due, Education has been provided regarding the importance of this vaccine. Advised may receive this vaccine at local pharmacy or Health Dept. Aware to provide a copy of the vaccination record if obtained from local pharmacy or Health Dept. Verbalized acceptance and understanding.  Pneumococcal vaccine status: Up to date  Covid-19 vaccine status: Completed vaccines  Qualifies for Shingles Vaccine? Yes   Zostavax completed Yes   Shingrix Completed?: No.    Education has been provided regarding the importance of this vaccine. Patient has been advised to call insurance company to determine out of pocket expense if they have not yet received this vaccine. Advised may also receive vaccine at local pharmacy or Health Dept. Verbalized acceptance and understanding.  Screening Tests Health Maintenance  Topic Date Due   TETANUS/TDAP  Never done   Zoster Vaccines- Shingrix (1 of 2) Never done   COVID-19 Vaccine (5 - Booster for Pfizer series) 08/30/2020   INFLUENZA VACCINE  09/13/2020   Pneumonia Vaccine 24+ Years old  Completed   HPV VACCINES  Aged Out    Health Maintenance  Health Maintenance Due  Topic Date Due   TETANUS/TDAP  Never done   Zoster Vaccines- Shingrix (1 of 2) Never done   COVID-19 Vaccine (5 - Booster for Pfizer series) 08/30/2020   INFLUENZA VACCINE  09/13/2020    Colorectal cancer screening: No longer required.   Lung Cancer Screening: (Low Dose CT Chest recommended if Age 18-80 years, 30 pack-year  currently smoking OR have quit w/in 15years.) does not qualify.   Lung Cancer Screening Referral: no  Additional Screening:  Hepatitis C Screening: does not qualify; Completed no  Vision Screening: Recommended annual ophthalmology exams for early detection of glaucoma and other disorders of the eye. Is the patient up to date with their annual eye exam?  Yes  Who is the provider or what is the name of the office in which the patient attends annual eye exams? [facility] If pt is not established with a provider, would they like to be referred to a provider to establish care? No .   Dental Screening: Recommended annual dental exams for proper oral hygiene  Community Resource Referral / Chronic Care Management: CRR required this visit?  No   CCM required this visit?  No      Plan:     I have personally reviewed and noted the following in the patient's chart:   Medical and social history Use of alcohol, tobacco or illicit drugs  Current medications and supplements including opioid prescriptions. Patient is not currently taking opioid prescriptions. Functional ability and status Nutritional status Physical activity Advanced directives List of other physicians Hospitalizations, surgeries, and ER visits in previous 12 months Vitals Screenings to include cognitive, depression, and falls Referrals and appointments  In addition, I have reviewed and discussed with patient certain preventive protocols, quality metrics, and best practice recommendations. A written personalized care plan for preventive services as well as general preventive health recommendations were provided to patient.     Sheral Flow, LPN   [id number]/34/[id number]   Nurse Notes:  Patient is cogitatively intact. There were no vitals filed for this visit. There is no height or weight on file to calculate BMI.  Medical screening examination/treatment/procedure(s) were performed by non-physician  practitioner and as supervising physician I was immediately available for consultation/collaboration.  I agree with above. Lew Dawes, MD

## 2020-12-29 ENCOUNTER — Ambulatory Visit: Payer: Medicare Other

## 2020-12-30 ENCOUNTER — Ambulatory Visit (INDEPENDENT_AMBULATORY_CARE_PROVIDER_SITE_OTHER): Payer: Medicare Other

## 2020-12-30 ENCOUNTER — Other Ambulatory Visit: Payer: Self-pay

## 2020-12-30 DIAGNOSIS — Z7901 Long term (current) use of anticoagulants: Secondary | ICD-10-CM | POA: Diagnosis not present

## 2020-12-30 LAB — POCT INR: INR: 1.6 — AB (ref 2.0–3.0)

## 2020-12-30 MED ORDER — WARFARIN SODIUM 5 MG PO TABS: ORAL_TABLET | ORAL | 3 refills | Status: DC

## 2020-12-30 NOTE — Progress Notes (Signed)
Patient ID: Clarence Hopkins, male   DOB: 06-12-1930, 87 y.o.   MRN: [id number]  Medical screening examination/treatment/procedure(s) were performed by non-physician practitioner and as supervising physician I was immediately available for consultation/collaboration.  I agree with above. Cathlean Cower, MD

## 2020-12-30 NOTE — Telephone Encounter (Signed)
Patient called back to regarding medication. Offered an OV for next available on 01/26/2021 but patient was concerned that was too long.   Would 01/26/2021 be okay to schedule or does he need to worked in sooner? If not, what do you suggest pt to do?   Please advise.

## 2020-12-30 NOTE — Progress Notes (Addendum)
Contacted Heather, RN at [facility], who reports pt has been taking 2.5mg  daily. Advised to increase dose to 5mg  today and change weekly dose to take 2.5mg  daily except take 5mg  on Mondays and Thursdays. Recheck in 2 wks. Faxed note to [facility].   Medical screening examination/treatment/procedure(s) were performed by non-physician practitioner and as supervising physician I was immediately available for consultation/collaboration.  I agree with above. Lew Dawes, MD

## 2020-12-30 NOTE — Patient Instructions (Addendum)
Pre visit review using our clinic review tool, if applicable. No additional management support is needed unless otherwise documented below in the visit note.  Increase dose to 5mg  today and change weekly dose to take 2.5mg  daily except take 5mg  on Mondays and Thursdays. Recheck in 2 wks. Faxed note to [employer].

## 2020-12-31 MED ORDER — TOLTERODINE TARTRATE ER 4 MG PO CP24: 4.0000 mg | ORAL_CAPSULE | Freq: Every day | ORAL | 1 refills | Status: DC

## 2020-12-31 NOTE — Telephone Encounter (Signed)
Continue Detrol LA 4 mg daily.  Thanks

## 2020-12-31 NOTE — Addendum Note (Signed)
Addended by: Cassandria Anger on: 12/31/2020 07:24 AM   Modules accepted: Orders

## 2021-01-05 ENCOUNTER — Other Ambulatory Visit: Payer: Self-pay | Admitting: Internal Medicine

## 2021-01-05 ENCOUNTER — Telehealth: Payer: Self-pay | Admitting: *Deleted

## 2021-01-05 DIAGNOSIS — R338 Other retention of urine: Secondary | ICD-10-CM | POA: Diagnosis not present

## 2021-01-05 DIAGNOSIS — N401 Enlarged prostate with lower urinary tract symptoms: Secondary | ICD-10-CM | POA: Diagnosis not present

## 2021-01-05 NOTE — Telephone Encounter (Signed)
Rec'd renewal fax fo Gemtesa 75 mg take 1 tablet by mouth daily. Per chart med has never been rx by Dr. Alain Marion. Will hold for MD response.Marland KitchenJohny Chess

## 2021-01-12 ENCOUNTER — Ambulatory Visit: Payer: Medicare Other

## 2021-01-12 DIAGNOSIS — D1801 Hemangioma of skin and subcutaneous tissue: Secondary | ICD-10-CM | POA: Diagnosis not present

## 2021-01-12 DIAGNOSIS — Z85828 Personal history of other malignant neoplasm of skin: Secondary | ICD-10-CM | POA: Diagnosis not present

## 2021-01-12 DIAGNOSIS — Z85820 Personal history of malignant melanoma of skin: Secondary | ICD-10-CM | POA: Diagnosis not present

## 2021-01-12 DIAGNOSIS — L821 Other seborrheic keratosis: Secondary | ICD-10-CM | POA: Diagnosis not present

## 2021-01-12 DIAGNOSIS — L570 Actinic keratosis: Secondary | ICD-10-CM | POA: Diagnosis not present

## 2021-01-12 DIAGNOSIS — D485 Neoplasm of uncertain behavior of skin: Secondary | ICD-10-CM | POA: Diagnosis not present

## 2021-01-12 DIAGNOSIS — D225 Melanocytic nevi of trunk: Secondary | ICD-10-CM | POA: Diagnosis not present

## 2021-01-13 ENCOUNTER — Telehealth: Payer: Self-pay

## 2021-01-13 NOTE — Telephone Encounter (Signed)
Contacted Heather at [facility] to get an INR result but she said pt will not be having labs there and that he will still need to go to this clinic for INR check at the coumadin clinic.  Advised this nurse will contact pt to schedule.  LVM for pt to call.

## 2021-01-14 ENCOUNTER — Ambulatory Visit: Payer: Medicare Other | Admitting: Internal Medicine

## 2021-01-14 ENCOUNTER — Other Ambulatory Visit: Payer: Self-pay | Admitting: *Deleted

## 2021-01-14 DIAGNOSIS — C73 Malignant neoplasm of thyroid gland: Secondary | ICD-10-CM

## 2021-01-14 NOTE — Telephone Encounter (Signed)
Pt has been scheduled for 12/7

## 2021-01-16 NOTE — Telephone Encounter (Signed)
Clarence Hopkins missed his office visit this week.  He can reschedule.  Thanks

## 2021-01-17 ENCOUNTER — Other Ambulatory Visit: Payer: Self-pay

## 2021-01-17 ENCOUNTER — Ambulatory Visit (HOSPITAL_BASED_OUTPATIENT_CLINIC_OR_DEPARTMENT_OTHER)
Admission: RE | Admit: 2021-01-17 | Discharge: 2021-01-17 | Disposition: A | Discharge status: Home / Self Care | Payer: Medicare Other | Source: Ambulatory Visit | Attending: Hematology & Oncology | Admitting: Hematology & Oncology

## 2021-01-17 ENCOUNTER — Inpatient Hospital Stay (HOSPITAL_BASED_OUTPATIENT_CLINIC_OR_DEPARTMENT_OTHER): Payer: Medicare Other | Admitting: Hematology & Oncology

## 2021-01-17 ENCOUNTER — Inpatient Hospital Stay: Payer: Medicare Other | Attending: Hematology & Oncology

## 2021-01-17 ENCOUNTER — Encounter: Payer: Self-pay | Admitting: Hematology & Oncology

## 2021-01-17 VITALS — BP 133/87 | HR 74 | Temp 97.8°F | Resp 17 | Wt 199.0 lb

## 2021-01-17 DIAGNOSIS — C73 Malignant neoplasm of thyroid gland: Secondary | ICD-10-CM | POA: Insufficient documentation

## 2021-01-17 DIAGNOSIS — I700 Atherosclerosis of aorta: Secondary | ICD-10-CM | POA: Diagnosis not present

## 2021-01-17 DIAGNOSIS — R911 Solitary pulmonary nodule: Secondary | ICD-10-CM | POA: Diagnosis not present

## 2021-01-17 DIAGNOSIS — R918 Other nonspecific abnormal finding of lung field: Secondary | ICD-10-CM | POA: Diagnosis not present

## 2021-01-17 LAB — CBC WITH DIFFERENTIAL (CANCER CENTER ONLY)
Abs Immature Granulocytes: 0.01 10*3/uL (ref 0.00–0.07)
Basophils Absolute: 0 10*3/uL (ref 0.0–0.1)
Basophils Relative: 1 %
Eosinophils Absolute: 0.2 10*3/uL (ref 0.0–0.5)
Eosinophils Relative: 4 %
HCT: 39 % (ref 39.0–52.0)
Hemoglobin: 12.1 g/dL — ABNORMAL LOW (ref 13.0–17.0)
Immature Granulocytes: 0 %
Lymphocytes Relative: 31 %
Lymphs Abs: 1.4 10*3/uL (ref 0.7–4.0)
MCH: 28.1 pg (ref 26.0–34.0)
MCHC: 31 g/dL (ref 30.0–36.0)
MCV: 90.7 fL (ref 80.0–100.0)
Monocytes Absolute: 0.5 10*3/uL (ref 0.1–1.0)
Monocytes Relative: 10 %
Neutro Abs: 2.6 10*3/uL (ref 1.7–7.7)
Neutrophils Relative %: 54 %
Platelet Count: 200 10*3/uL (ref 150–400)
RBC: 4.3 MIL/uL (ref 4.22–5.81)
RDW: 13 % (ref 11.5–15.5)
WBC Count: 4.7 10*3/uL (ref 4.0–10.5)
nRBC: 0 % (ref 0.0–0.2)

## 2021-01-17 LAB — CMP (CANCER CENTER ONLY)
ALT: 16 U/L (ref 0–44)
AST: 21 U/L (ref 15–41)
Albumin: 3.6 g/dL (ref 3.5–5.0)
Alkaline Phosphatase: 112 U/L (ref 38–126)
Anion gap: 5 (ref 5–15)
BUN: 17 mg/dL (ref 8–23)
CO2: 31 mmol/L (ref 22–32)
Calcium: 9.4 mg/dL (ref 8.9–10.3)
Chloride: 103 mmol/L (ref 98–111)
Creatinine: 1.12 mg/dL (ref 0.61–1.24)
GFR, Estimated: >60 mL/min (ref >60)
Glucose, Bld: 93 mg/dL (ref 70–99)
Potassium: 4.7 mmol/L (ref 3.5–5.1)
Sodium: 139 mmol/L (ref 135–145)
Total Bilirubin: 0.6 mg/dL (ref 0.3–1.2)
Total Protein: 6.6 g/dL (ref 6.5–8.1)

## 2021-01-17 LAB — LACTATE DEHYDROGENASE: LDH: 141 U/L (ref 98–192)

## 2021-01-17 NOTE — Progress Notes (Signed)
Hematology and Oncology Follow Up Visit  Clarence Hopkins [id number] 05-Jun-1930 87 y.o. 01/17/2021   Principle Diagnosis:  Metastatic papillary carcinoma of the thyroid  Current Therapy:   S/p I-131 therapy on 08/27/2019     Interim History:  Clarence Hopkins is back for follow-up.  He does look quite good.  We last saw him back in September.  Since then, he has been doing pretty well.  He and his wife are now in assisted living.  He did have a CT scan done today.  Thankfully, everything looks fantastic.  Everything looks stable.  There is no growth of his cancer.  There are no new areas of growth.    He feels okay.  He does get weak on occasion.  He is on Coumadin.  Unfortunate, he was hospitalized at the end of month or so ago because of bladder outlet obstruction.  He finally had a catheter placed.  He is seen Urology.  He was placed on Proscar to try to help with his prostate enlargement.  He has had no problems with cough.  There is been no hemoptysis.  He has had no issues with nausea or vomiting.  He did have a nice Thanksgiving.  He does have a little bit of leg swelling.  He has had no rashes.  Overall, I would say his performance status is probably ECOG 2.    Medications:  Current Outpatient Medications:    bisacodyl (DULCOLAX) 10 MG suppository, Place 1 suppository (10 mg total) rectally as needed for moderate constipation., Disp: 12 suppository, Rfl: 0   bisacodyl (DULCOLAX) 5 MG EC tablet, Take 1 tablet (5 mg total) by mouth daily., Disp: 30 tablet, Rfl: 0   Cholecalciferol 25 MCG (1000 UT) tablet, Take by mouth., Disp: , Rfl:    finasteride (PROSCAR) 5 MG tablet, Take 5 mg by mouth daily., Disp: , Rfl:    levothyroxine (SYNTHROID) 175 MCG tablet, Take 175 mcg by mouth daily before breakfast., Disp: , Rfl:    melatonin 5 MG TABS, Take 5 mg by mouth at bedtime., Disp: , Rfl:    mupirocin ointment (BACTROBAN) 2 %, Apply 1 application topically daily., Disp: , Rfl:    polyethylene  glycol (MIRALAX / GLYCOLAX) 17 g packet, Take 17 g by mouth 2 (two) times daily., Disp: 60 each, Rfl: 1   polyvinyl alcohol (LIQUIFILM TEARS) 1.4 % ophthalmic solution, Place 1 drop into both eyes as needed for dry eyes., Disp: , Rfl:    senna-docusate (SENOKOT-S) 8.6-50 MG tablet, Take 1 tablet by mouth 2 (two) times daily., Disp: 60 tablet, Rfl: 1   tamsulosin (FLOMAX) 0.4 MG CAPS capsule, TAKE 1 CAPSULE BY MOUTH DAILY AFTER SUPPER, Disp: 90 capsule, Rfl: 3   tolterodine (DETROL LA) 4 MG 24 hr capsule, Take 1 capsule (4 mg total) by mouth daily., Disp: 30 capsule, Rfl: 1   Vibegron (GEMTESA) 75 MG TABS, Take 1 tablet by mouth daily as needed. Take 1 tablet by mouth daily, Disp: , Rfl:    warfarin (COUMADIN) 5 MG tablet, TAKE 1/2 TABLET DAILY BY MOUTH EXCEPT TAKE 1 TABLET ON MONDAYS AND THURSDAYS OR AS DIRECTED BY ANTICOAGULATION CLINIC, Disp: 45 tablet, Rfl: 3   diltiazem 2 % GEL, Apply 1 application topically 3 (three) times daily for 7 days. apply a small amount inside the anal opening and to the external anal area tid for 2 weeks, Disp: 21 g, Rfl: 0  Allergies: No Known Allergies  Past Medical History, Surgical history,  Social history, and Family History were reviewed and updated.  Review of Systems: Review of Systems  Constitutional: Negative.   HENT:  Negative.    Eyes: Negative.   Respiratory: Negative.    Cardiovascular: Negative.   Gastrointestinal: Negative.   Endocrine: Negative.   Genitourinary: Negative.    Musculoskeletal: Negative.   Skin: Negative.   Neurological: Negative.   Hematological: Negative.   Psychiatric/Behavioral: Negative.     Physical Exam:  weight is 199 lb (90.3 kg). His oral temperature is 97.8 F (36.6 C). His blood pressure is 133/87 and his pulse is 74. His respiration is 17 and oxygen saturation is 99%.   Wt Readings from Last 3 Encounters:  01/17/21 199 lb (90.3 kg)  12/03/20 100 lb (45.4 kg)  11/29/20 207 lb (93.9 kg)    Physical  Exam Vitals reviewed.  HENT:     Head: Normocephalic and atraumatic.  Eyes:     Pupils: Pupils are equal, round, and reactive to light.  Cardiovascular:     Heart sounds: Normal heart sounds.     Comments: Cardiac exam is slow but regular.  He does have occasional extra beats.  He does have an occasional skipped beat.  He has 1/6 systolic ejection murmur. Pulmonary:     Effort: Pulmonary effort is normal.     Breath sounds: Normal breath sounds.  Abdominal:     General: Bowel sounds are normal.     Palpations: Abdomen is soft.  Musculoskeletal:        General: No tenderness or deformity. Normal range of motion.     Cervical back: Normal range of motion.  Lymphadenopathy:     Cervical: No cervical adenopathy.  Skin:    General: Skin is warm and dry.     Findings: No erythema or rash.  Neurological:     Mental Status: He is alert and oriented to person, place, and time.  Psychiatric:        Behavior: Behavior normal.        Thought Content: Thought content normal.        Judgment: Judgment normal.     Lab Results  Component Value Date   WBC 4.7 01/17/2021   HGB 12.1 (L) 01/17/2021   HCT 39.0 01/17/2021   MCV 90.7 01/17/2021   PLT 200 01/17/2021     Chemistry      Component Value Date/Time   NA 139 01/17/2021 0859   NA 142 11/29/2020 0000   K 4.7 01/17/2021 0859   CL 103 01/17/2021 0859   CO2 31 01/17/2021 0859   BUN 17 01/17/2021 0859   BUN 19 11/29/2020 0000   CREATININE 1.12 01/17/2021 0859   GLU 95 11/29/2020 0000      Component Value Date/Time   CALCIUM 9.4 01/17/2021 0859   ALKPHOS 112 01/17/2021 0859   AST 21 01/17/2021 0859   ALT 16 01/17/2021 0859   BILITOT 0.6 01/17/2021 0859      Impression and Plan: Clarence Hopkins is a very nice 87 year old white male.  He has metastatic thyroid cancer.  This is differentiated thyroid cancer.  Thankfully it is responsive to the radioactive iodine.  He is still responding.    I hate the fact that he had the bladder  outlet obstruction.  Hopefully, his prostate can be shrinking with the Proscar.  I think that given the stability of the CT scan, we can probably do another CT scan in the early Spring.  I just hope that he does  okay over the Winter.  Hopefully does not have any problems with falling.  Last thing he needs is a fall and break something.   [name], MD 12/5/20225:47 PM

## 2021-01-19 ENCOUNTER — Ambulatory Visit (INDEPENDENT_AMBULATORY_CARE_PROVIDER_SITE_OTHER): Payer: Medicare Other

## 2021-01-19 DIAGNOSIS — Z7901 Long term (current) use of anticoagulants: Secondary | ICD-10-CM

## 2021-01-19 LAB — POCT INR: INR: 1.3 — AB (ref 2.0–3.0)

## 2021-01-19 NOTE — Patient Instructions (Addendum)
Pre visit review using our clinic review tool, if applicable. No additional management support is needed unless otherwise documented below in the visit note.  Advised to give 7.5mg  today, 5mg  tomorrow and then change weekly dose to take 5mg  daily except take 2.5mg  on Mon, Wed, Fri and pt will have recheck on 12/14.

## 2021-01-19 NOTE — Progress Notes (Addendum)
Advised to increase dose to 5mg  today and change dose tomorrow to 7.5 mg and then change weekly dose to take 5mg  daily except take 2.5 mg on Mondays, Wednesdays and Fridays. Recheck in 1 weeks

## 2021-01-20 DIAGNOSIS — R8271 Bacteriuria: Secondary | ICD-10-CM | POA: Diagnosis not present

## 2021-01-20 DIAGNOSIS — R338 Other retention of urine: Secondary | ICD-10-CM | POA: Diagnosis not present

## 2021-01-20 DIAGNOSIS — N3000 Acute cystitis without hematuria: Secondary | ICD-10-CM | POA: Diagnosis not present

## 2021-01-26 ENCOUNTER — Ambulatory Visit: Payer: Medicare Other

## 2021-01-27 ENCOUNTER — Telehealth: Payer: Self-pay

## 2021-01-27 ENCOUNTER — Ambulatory Visit: Payer: Medicare Other

## 2021-01-27 NOTE — Telephone Encounter (Signed)
Pt NS apt this morning.

## 2021-01-27 NOTE — Telephone Encounter (Signed)
Pt had coumadin clinic apt yesterday at [facility] for INR check and did not show up for that apt. Contacted pt and reported he thought it was next week. Advised pt his f/u apt was yesterday. Pt agreed to go to [facility] coumadin clinic today and was scheduled for this morning. Pt NS apt again. Pt was advised yesterday that the coumadin clinic nurse will not be in the office next week and the importance of checking his INR this week. Pt verbalized understanding and accepted apt at [facility].

## 2021-02-03 DIAGNOSIS — N3000 Acute cystitis without hematuria: Secondary | ICD-10-CM | POA: Diagnosis not present

## 2021-02-03 DIAGNOSIS — R338 Other retention of urine: Secondary | ICD-10-CM | POA: Diagnosis not present

## 2021-02-09 NOTE — Telephone Encounter (Signed)
Contacted pt and RS for tomorrow at [facility] at 1:30. Pt agreed and appreciated call.

## 2021-02-10 ENCOUNTER — Ambulatory Visit: Payer: Medicare Other

## 2021-02-15 NOTE — Telephone Encounter (Signed)
Pt called to RS apt. Advised [facility] location is full this week. Pt agreed to go to [facility] location on 1/5 at 11:30.

## 2021-02-17 ENCOUNTER — Other Ambulatory Visit: Payer: Self-pay

## 2021-02-17 ENCOUNTER — Ambulatory Visit (INDEPENDENT_AMBULATORY_CARE_PROVIDER_SITE_OTHER): Payer: Medicare Other

## 2021-02-17 DIAGNOSIS — Z7901 Long term (current) use of anticoagulants: Secondary | ICD-10-CM

## 2021-02-17 LAB — POCT INR: INR: 2 (ref 2.0–3.0)

## 2021-02-17 NOTE — Patient Instructions (Addendum)
Pre visit review using our clinic review tool, if applicable. No additional management support is needed unless otherwise documented below in the visit note.  Continue 5mg  daily except take 2.5 mg on Mondays, Wednesdays and Fridays. Recheck in 4 weeks

## 2021-02-17 NOTE — Progress Notes (Addendum)
Continue 5mg  daily except take 2.5 mg on Mondays, Wednesdays and Fridays. Recheck in 4 weeks  Medical screening examination/treatment/procedure(s) were performed by non-physician practitioner and as supervising physician I was immediately available for consultation/collaboration.  I agree with above. Lew Dawes, MD

## 2021-02-21 ENCOUNTER — Emergency Department (HOSPITAL_COMMUNITY): Payer: Medicare Other

## 2021-02-21 ENCOUNTER — Other Ambulatory Visit: Payer: Self-pay

## 2021-02-21 ENCOUNTER — Emergency Department (HOSPITAL_COMMUNITY)
Admission: EM | Admit: 2021-02-21 | Discharge: 2021-02-21 | Disposition: A | Discharge status: Home / Self Care | Payer: Medicare Other | Attending: Emergency Medicine | Admitting: Emergency Medicine

## 2021-02-21 DIAGNOSIS — R9431 Abnormal electrocardiogram [ECG] [EKG]: Secondary | ICD-10-CM | POA: Diagnosis not present

## 2021-02-21 DIAGNOSIS — Z981 Arthrodesis status: Secondary | ICD-10-CM | POA: Diagnosis not present

## 2021-02-21 DIAGNOSIS — S0240CA Maxillary fracture, right side, initial encounter for closed fracture: Secondary | ICD-10-CM | POA: Insufficient documentation

## 2021-02-21 DIAGNOSIS — S46911A Strain of unspecified muscle, fascia and tendon at shoulder and upper arm level, right arm, initial encounter: Secondary | ICD-10-CM | POA: Insufficient documentation

## 2021-02-21 DIAGNOSIS — W19XXXA Unspecified fall, initial encounter: Secondary | ICD-10-CM

## 2021-02-21 DIAGNOSIS — S199XXA Unspecified injury of neck, initial encounter: Secondary | ICD-10-CM | POA: Diagnosis not present

## 2021-02-21 DIAGNOSIS — S0993XA Unspecified injury of face, initial encounter: Secondary | ICD-10-CM | POA: Diagnosis present

## 2021-02-21 DIAGNOSIS — I672 Cerebral atherosclerosis: Secondary | ICD-10-CM | POA: Diagnosis not present

## 2021-02-21 DIAGNOSIS — J90 Pleural effusion, not elsewhere classified: Secondary | ICD-10-CM | POA: Diagnosis not present

## 2021-02-21 DIAGNOSIS — S0990XA Unspecified injury of head, initial encounter: Secondary | ICD-10-CM | POA: Diagnosis not present

## 2021-02-21 DIAGNOSIS — Z7901 Long term (current) use of anticoagulants: Secondary | ICD-10-CM

## 2021-02-21 DIAGNOSIS — W109XXA Fall (on) (from) unspecified stairs and steps, initial encounter: Secondary | ICD-10-CM | POA: Insufficient documentation

## 2021-02-21 DIAGNOSIS — S02401A Maxillary fracture, unspecified, initial encounter for closed fracture: Secondary | ICD-10-CM | POA: Diagnosis not present

## 2021-02-21 DIAGNOSIS — M4322 Fusion of spine, cervical region: Secondary | ICD-10-CM | POA: Diagnosis not present

## 2021-02-21 DIAGNOSIS — M19011 Primary osteoarthritis, right shoulder: Secondary | ICD-10-CM | POA: Diagnosis not present

## 2021-02-21 DIAGNOSIS — Z043 Encounter for examination and observation following other accident: Secondary | ICD-10-CM | POA: Diagnosis not present

## 2021-02-21 LAB — CBC WITH DIFFERENTIAL/PLATELET
Abs Immature Granulocytes: 0.03 10*3/uL (ref 0.00–0.07)
Basophils Absolute: 0 10*3/uL (ref 0.0–0.1)
Basophils Relative: 0 %
Eosinophils Absolute: 0.2 10*3/uL (ref 0.0–0.5)
Eosinophils Relative: 3 %
HCT: 41 % (ref 39.0–52.0)
Hemoglobin: 12.7 g/dL — ABNORMAL LOW (ref 13.0–17.0)
Immature Granulocytes: 1 %
Lymphocytes Relative: 18 %
Lymphs Abs: 1.2 10*3/uL (ref 0.7–4.0)
MCH: 27.9 pg (ref 26.0–34.0)
MCHC: 31 g/dL (ref 30.0–36.0)
MCV: 89.9 fL (ref 80.0–100.0)
Monocytes Absolute: 0.6 10*3/uL (ref 0.1–1.0)
Monocytes Relative: 10 %
Neutro Abs: 4.5 10*3/uL (ref 1.7–7.7)
Neutrophils Relative %: 68 %
Platelets: 202 10*3/uL (ref 150–400)
RBC: 4.56 MIL/uL (ref 4.22–5.81)
RDW: 12.7 % (ref 11.5–15.5)
WBC: 6.5 10*3/uL (ref 4.0–10.5)
nRBC: 0 % (ref 0.0–0.2)

## 2021-02-21 LAB — BASIC METABOLIC PANEL
Anion gap: 7 (ref 5–15)
BUN: 16 mg/dL (ref 8–23)
CO2: 25 mmol/L (ref 22–32)
Calcium: 8.5 mg/dL — ABNORMAL LOW (ref 8.9–10.3)
Chloride: 103 mmol/L (ref 98–111)
Creatinine, Ser: 1.17 mg/dL (ref 0.61–1.24)
GFR, Estimated: 60 mL/min — ABNORMAL LOW (ref >60)
Glucose, Bld: 92 mg/dL (ref 70–99)
Potassium: 4.2 mmol/L (ref 3.5–5.1)
Sodium: 135 mmol/L (ref 135–145)

## 2021-02-21 LAB — PROTIME-INR
INR: 1.6 — ABNORMAL HIGH (ref 0.8–1.2)
Prothrombin Time: 19.4 seconds — ABNORMAL HIGH (ref 11.4–15.2)

## 2021-02-21 MED ORDER — TRAMADOL HCL 50 MG PO TABS: ORAL_TABLET | ORAL | 0 refills | Status: DC

## 2021-02-21 MED ORDER — MORPHINE SULFATE (PF) 4 MG/ML IV SOLN
4.0000 mg | Freq: Once | INTRAVENOUS | Status: AC
  Administered 2021-02-21: 4 mg via INTRAVENOUS
  Filled 2021-02-21: qty 1

## 2021-02-21 MED ORDER — ACETAMINOPHEN 500 MG PO TABS
1000.0000 mg | ORAL_TABLET | Freq: Once | ORAL | Status: AC
  Administered 2021-02-21: 1000 mg via ORAL
  Filled 2021-02-21: qty 2

## 2021-02-21 NOTE — Discharge Instructions (Signed)
1.  Reviewed head injury instructions.  Return immediately if there is any confusion, nausea and vomiting, sudden severe headache or other concerning symptoms. 2.  You do have a fracture in your maxillary sinus.  There may be bleeding and pressure behind the sinus.  Typically, these injuries heal on their own.  However if you are having any significant difficulty you may need referral to an ear nose throat specialist.  Do not blow your nose, try to sleep with your head elevated about 30 degrees.  You may apply a well wrapped ice pack to the swelling on your cheek. 3.  You may take 2 extra strength Tylenol every 6 hours for pain control.  If you need additional pain control, you may add 1-2 tramadol tablets as prescribed. 4.  Try to schedule a recheck with your family doctor within the next 2 to 4 days.  You may need referral to specialist if you are having any difficulty with your injuries.

## 2021-02-21 NOTE — ED Provider Notes (Signed)
[facility] EMERGENCY DEPARTMENT Provider Note   CSN: [id number] Arrival date & time: 02/21/21  1634     History  Chief Complaint  Patient presents with   Fall    On coumadin    Clarence Hopkins is a 88 y.o. male.  HPI Patient is chronically anticoagulated on Coumadin for history of recurrent pulmonary embolus.  He was going into a store and either missed stepped and lost his balance or, briefly lost consciousness.  Patient reports he is unsure.  He reports he felt fine and was heading into the [employer] store with his wife when he fell landing on the right side of his body and striking his head.  He reports he does not think he lost consciousness.  He is just not clear if the fall was precipitated by a mechanical cause or very briefly coming close to losing consciousness.  Patient denies he has a headache.  Reports he has some facial pain where he is bruised and swollen.  He denies pain in his neck.  He reports he has some pain on the right shoulder particularly if he moves it in certain directions although he will illustrate ability to slowly elevate it with slight help from the other extremity.  Reports most of the pain is right up around the top of the shoulder.  He denies numbness or weakness into the arms or legs.  He denies any pain in his hips back or legs.  He denies any chest pain or difficulty breathing.    Home Medications Prior to Admission medications   Medication Sig Start Date End Date Taking? Authorizing Provider  amoxicillin-clavulanate (AUGMENTIN) 500-125 MG tablet Take 1 tablet by mouth 2 (two) times daily. 01/20/21  Yes [provider]  bisacodyl (DULCOLAX) 10 MG suppository Place 1 suppository (10 mg total) rectally as needed for moderate constipation. 11/16/20  Yes Maudie Flakes, MD  bisacodyl (DULCOLAX) 5 MG EC tablet Take 1 tablet (5 mg total) by mouth daily. 11/24/20  Yes Bonnielee Haff, MD  Cholecalciferol 25 MCG (1000 UT) tablet Take 1,000  Units by mouth daily.   Yes [provider]  doxycycline (VIBRA-TABS) 100 MG tablet Take 100 mg by mouth 2 (two) times daily. 01/24/21  Yes [provider]  finasteride (PROSCAR) 5 MG tablet Take 5 mg by mouth daily. 11/05/20  Yes [provider]  levothyroxine (SYNTHROID) 175 MCG tablet Take 175 mcg by mouth daily before breakfast.   Yes [provider]  melatonin 5 MG TABS Take 5 mg by mouth at bedtime.   Yes [provider]  polyvinyl alcohol (LIQUIFILM TEARS) 1.4 % ophthalmic solution Place 1 drop into both eyes as needed for dry eyes.   Yes [provider]  tolterodine (DETROL LA) 4 MG 24 hr capsule Take 1 capsule (4 mg total) by mouth daily. 12/31/20  Yes Plotnikov, Evie Lacks, MD  traMADol (ULTRAM) 50 MG tablet 1-2 tablets every 6 hours as needed for pain 02/21/21  Yes Sunya Humbarger, Jeannie Done, MD  warfarin (COUMADIN) 5 MG tablet TAKE 1/2 TABLET DAILY BY MOUTH EXCEPT TAKE 1 TABLET ON MONDAYS AND [place] OR AS DIRECTED BY ANTICOAGULATION CLINIC Patient taking differently: Take 5 mg by mouth as directed. TAKE 1/2 TABLET DAILY BY MOUTH EXCEPT TAKE 1 TABLET ON MONDAYS AND THURSDAYS OR AS DIRECTED BY ANTICOAGULATION CLINIC 12/30/20  Yes Plotnikov, Evie Lacks, MD  diltiazem 2 % GEL Apply 1 application topically 3 (three) times daily for 7 days. apply a small  amount inside the anal opening and to the external anal area tid for 2 weeks Patient not taking: Reported on 02/21/2021 12/03/20 12/10/20  [name], NP  mupirocin ointment (BACTROBAN) 2 % Apply 1 application topically daily.    [provider]  tamsulosin (FLOMAX) 0.4 MG CAPS capsule TAKE 1 CAPSULE BY MOUTH DAILY AFTER SUPPER Patient not taking: Reported on 02/21/2021 08/05/20   Plotnikov, Evie Lacks, MD  Vibegron (GEMTESA) 75 MG TABS Take 1 tablet by mouth daily as needed (overactive bladder). 12/14/20   [provider]      Allergies    Patient has no known allergies.    Review  of Systems   Review of Systems 10 systems reviewed negative except as per HPI Physical Exam Updated Vital Signs BP (!) 148/78    Pulse 76    Temp 98.5 F (36.9 C) (Oral)    Resp 11    Ht 6\' 3"  (1.905 m)    Wt 92.1 kg    SpO2 97%    BMI 25.37 kg/m  Physical Exam Constitutional:      Comments: Alert.  GCS 15.  Answering all questions appropriately.  No respiratory distress.  HENT:     Head:     Comments: Ecchymotic bruising over the right zygoma.  No lacerations or actively bleeding injuries.    Nose: Nose normal.     Mouth/Throat:     Mouth: Mucous membranes are moist.     Pharynx: Oropharynx is clear.  Eyes:     Extraocular Movements: Extraocular movements intact.  Neck:     Comments: No midline C-spine tenderness Cardiovascular:     Rate and Rhythm: Normal rate and regular rhythm.  Pulmonary:     Effort: Pulmonary effort is normal.     Breath sounds: Normal breath sounds.  Chest:     Chest wall: No tenderness.  Abdominal:     General: There is no distension.     Palpations: Abdomen is soft.     Tenderness: There is no abdominal tenderness. There is no guarding.  Musculoskeletal:     Comments: Patient endorses discomfort to palpation over the point of the shoulder.  No obvious deformity.  He has painless range of motion but does slowly and spontaneously elevate the arm in a flexion to about 90 degrees.  He does this with some assistance from the other hand.  Hand is warm and dry and the distal pulses are 2+.  Grip strength is 5\5.  No evident deformities lower extremities.  No tenderness of the hip knee or foot.  Skin:    General: Skin is warm and dry.  Neurological:     General: No focal deficit present.     Mental Status: He is oriented to person, place, and time.     Cranial Nerves: No cranial nerve deficit.     Motor: No weakness.     Coordination: Coordination normal.  Psychiatric:        Mood and Affect: Mood normal.    ED Results / Procedures / Treatments    Labs (all labs ordered are listed, but only abnormal results are displayed) Labs Reviewed  BASIC METABOLIC PANEL - Abnormal; Notable for the following components:      Result Value   Calcium 8.5 (*)    GFR, Estimated 60 (*)    All other components within normal limits  CBC WITH DIFFERENTIAL/PLATELET - Abnormal; Notable for the following components:   Hemoglobin 12.7 (*)    All  other components within normal limits  PROTIME-INR - Abnormal; Notable for the following components:   Prothrombin Time 19.4 (*)    INR 1.6 (*)    All other components within normal limits    EKG EKG Interpretation  Date/Time:  Monday February 21 2021 16:59:20 EST Ventricular Rate:  68 PR Interval:  206 QRS Duration: 97 QT Interval:  407 QTC Calculation: 433 R Axis:   84 Text Interpretation: Sinus rhythm Borderline right axis deviation normal. no ischemic appeance, no dysythmia. no old comparison Confirmed by Charlesetta Shanks [phone]) on 02/21/2021 5:59:39 PM  Radiology DG Shoulder Right  Result Date: 02/21/2021 CLINICAL DATA:  Fall. EXAM: RIGHT SHOULDER - 2+ VIEW COMPARISON:  None. FINDINGS: There is no evidence for acute fracture or dislocation. There are moderate severe degenerative changes of the acromioclavicular joint with joint space narrowing, sclerosis and osteophyte formation. There are mild degenerative changes of the glenohumeral joint space with joint space narrowing and osteophyte formation. There is soft tissue calcification superior to the greater tuberosity compatible with calcific tendinitis. IMPRESSION: 1. No acute fracture or dislocation. 2. Moderate severe degenerative changes of the shoulder including calcific tendinitis. Electronically Signed   By: Ronney Asters M.D.   On: 02/21/2021 17:20   CT Head Wo Contrast  Result Date: 02/21/2021 CLINICAL DATA:  Head trauma, minor (Age >= 65y); Neck trauma (Age >= 65y); Facial trauma, blunt. Mechanical fall on thinners, fell up the steps AND hit face.  EXAM: CT HEAD WITHOUT CONTRAST CT MAXILLOFACIAL WITHOUT CONTRAST CT CERVICAL SPINE WITHOUT CONTRAST TECHNIQUE: Multidetector CT imaging of the head, cervical spine, and maxillofacial structures were performed using the standard protocol without intravenous contrast. Multiplanar CT image reconstructions of the cervical spine and maxillofacial structures were also generated. COMPARISON:  None. FINDINGS: CT HEAD FINDINGS BRAIN: BRAIN Cerebral ventricle sizes are concordant with the degree of cerebral volume loss. Patchy and confluent areas of decreased attenuation are noted throughout the deep and periventricular white matter of the cerebral hemispheres bilaterally, compatible with chronic microvascular ischemic disease. No evidence of large-territorial acute infarction. No parenchymal hemorrhage. No mass lesion. No extra-axial collection. No mass effect or midline shift. No hydrocephalus. Basilar cisterns are patent. Vascular: No hyperdense vessel. Atherosclerotic calcifications are present within the cavernous internal carotid and vertebral arteries. Skull: No acute fracture or focal lesion. Other: None. CT MAXILLOFACIAL FINDINGS Osseous: Acute minimally displaced right anterior maxillary wall fracture. Sinuses/Orbits: Mucosal thickening of the right maxillary sinus. Otherwise paranasal sinuses and mastoid air cells are clear. The orbits are unremarkable. Soft tissues: Mild right maxillary subcutaneus soft tissue edema with no large hematoma formation. CT CERVICAL SPINE FINDINGS Alignment: Straightening of the normal cervical lordosis likely due to positioning and surgical hardware. Skull base and vertebrae: C3-C4 anterior cervical spine fusion surgical hardware. C5-C7 anterior cervical spine fusion surgical hardware. Multilevel osseous neuroforaminal stenosis. No acute fracture. No aggressive appearing focal osseous lesion or focal pathologic process. Soft tissues and spinal canal: No prevertebral fluid or swelling.  No visible canal hematoma. Upper chest: Unremarkable. Other: None. IMPRESSION: 1.  No acute intracranial abnormality. 2. Acute minimally displaced right anterior maxillary wall fracture. 3. No acute displaced fracture or traumatic listhesis of the cervical spine. 4. C3-C4 anterior cervical spine fusion surgical hardware. C5-C7 anterior cervical spine fusion surgical hardware. Multilevel osseous neuroforaminal stenosis. Electronically Signed   By: Iven Finn M.D.   On: 02/21/2021 17:49   CT Cervical Spine Wo Contrast  Result Date: 02/21/2021 CLINICAL DATA:  Head trauma, minor (Age >=  65y); Neck trauma (Age >= 65y); Facial trauma, blunt. Mechanical fall on thinners, fell up the steps AND hit face. EXAM: CT HEAD WITHOUT CONTRAST CT MAXILLOFACIAL WITHOUT CONTRAST CT CERVICAL SPINE WITHOUT CONTRAST TECHNIQUE: Multidetector CT imaging of the head, cervical spine, and maxillofacial structures were performed using the standard protocol without intravenous contrast. Multiplanar CT image reconstructions of the cervical spine and maxillofacial structures were also generated. COMPARISON:  None. FINDINGS: CT HEAD FINDINGS BRAIN: BRAIN Cerebral ventricle sizes are concordant with the degree of cerebral volume loss. Patchy and confluent areas of decreased attenuation are noted throughout the deep and periventricular white matter of the cerebral hemispheres bilaterally, compatible with chronic microvascular ischemic disease. No evidence of large-territorial acute infarction. No parenchymal hemorrhage. No mass lesion. No extra-axial collection. No mass effect or midline shift. No hydrocephalus. Basilar cisterns are patent. Vascular: No hyperdense vessel. Atherosclerotic calcifications are present within the cavernous internal carotid and vertebral arteries. Skull: No acute fracture or focal lesion. Other: None. CT MAXILLOFACIAL FINDINGS Osseous: Acute minimally displaced right anterior maxillary wall fracture.  Sinuses/Orbits: Mucosal thickening of the right maxillary sinus. Otherwise paranasal sinuses and mastoid air cells are clear. The orbits are unremarkable. Soft tissues: Mild right maxillary subcutaneus soft tissue edema with no large hematoma formation. CT CERVICAL SPINE FINDINGS Alignment: Straightening of the normal cervical lordosis likely due to positioning and surgical hardware. Skull base and vertebrae: C3-C4 anterior cervical spine fusion surgical hardware. C5-C7 anterior cervical spine fusion surgical hardware. Multilevel osseous neuroforaminal stenosis. No acute fracture. No aggressive appearing focal osseous lesion or focal pathologic process. Soft tissues and spinal canal: No prevertebral fluid or swelling. No visible canal hematoma. Upper chest: Unremarkable. Other: None. IMPRESSION: 1.  No acute intracranial abnormality. 2. Acute minimally displaced right anterior maxillary wall fracture. 3. No acute displaced fracture or traumatic listhesis of the cervical spine. 4. C3-C4 anterior cervical spine fusion surgical hardware. C5-C7 anterior cervical spine fusion surgical hardware. Multilevel osseous neuroforaminal stenosis. Electronically Signed   By: Iven Finn M.D.   On: 02/21/2021 17:49   DG Chest Port 1 View  Result Date: 02/21/2021 CLINICAL DATA:  Fall. EXAM: PORTABLE CHEST 1 VIEW COMPARISON:  Chest x-ray 04/30/2018.  CT of the chest 01/17/2021. FINDINGS: There are few nodular densities in the lower lungs similar to prior CT, although not well defined on this exam. There is no new focal lung infiltrate. The cardiomediastinal silhouette is within normal limits and stable. There is no pneumothorax. There is a small left pleural effusion. No acute fractures are seen. Cervical spinal fusion plate is present. IMPRESSION: 1. Small left pleural effusion. 2. No evidence for focal pneumonia. 3. Grossly unchanged nodular densities in the lower lungs. Electronically Signed   By: Ronney Asters M.D.   On:  02/21/2021 17:21   CT Maxillofacial WO CM  Result Date: 02/21/2021 CLINICAL DATA:  Head trauma, minor (Age >= 65y); Neck trauma (Age >= 65y); Facial trauma, blunt. Mechanical fall on thinners, fell up the steps AND hit face. EXAM: CT HEAD WITHOUT CONTRAST CT MAXILLOFACIAL WITHOUT CONTRAST CT CERVICAL SPINE WITHOUT CONTRAST TECHNIQUE: Multidetector CT imaging of the head, cervical spine, and maxillofacial structures were performed using the standard protocol without intravenous contrast. Multiplanar CT image reconstructions of the cervical spine and maxillofacial structures were also generated. COMPARISON:  None. FINDINGS: CT HEAD FINDINGS BRAIN: BRAIN Cerebral ventricle sizes are concordant with the degree of cerebral volume loss. Patchy and confluent areas of decreased attenuation are noted throughout the deep and periventricular white matter  of the cerebral hemispheres bilaterally, compatible with chronic microvascular ischemic disease. No evidence of large-territorial acute infarction. No parenchymal hemorrhage. No mass lesion. No extra-axial collection. No mass effect or midline shift. No hydrocephalus. Basilar cisterns are patent. Vascular: No hyperdense vessel. Atherosclerotic calcifications are present within the cavernous internal carotid and vertebral arteries. Skull: No acute fracture or focal lesion. Other: None. CT MAXILLOFACIAL FINDINGS Osseous: Acute minimally displaced right anterior maxillary wall fracture. Sinuses/Orbits: Mucosal thickening of the right maxillary sinus. Otherwise paranasal sinuses and mastoid air cells are clear. The orbits are unremarkable. Soft tissues: Mild right maxillary subcutaneus soft tissue edema with no large hematoma formation. CT CERVICAL SPINE FINDINGS Alignment: Straightening of the normal cervical lordosis likely due to positioning and surgical hardware. Skull base and vertebrae: C3-C4 anterior cervical spine fusion surgical hardware. C5-C7 anterior cervical spine  fusion surgical hardware. Multilevel osseous neuroforaminal stenosis. No acute fracture. No aggressive appearing focal osseous lesion or focal pathologic process. Soft tissues and spinal canal: No prevertebral fluid or swelling. No visible canal hematoma. Upper chest: Unremarkable. Other: None. IMPRESSION: 1.  No acute intracranial abnormality. 2. Acute minimally displaced right anterior maxillary wall fracture. 3. No acute displaced fracture or traumatic listhesis of the cervical spine. 4. C3-C4 anterior cervical spine fusion surgical hardware. C5-C7 anterior cervical spine fusion surgical hardware. Multilevel osseous neuroforaminal stenosis. Electronically Signed   By: Iven Finn M.D.   On: 02/21/2021 17:49    Procedures Procedures    Medications Ordered in ED Medications  acetaminophen (TYLENOL) tablet 1,000 mg (has no administration in time range)  morphine 4 MG/ML injection 4 mg (4 mg Intravenous Given 02/21/21 1757)    ED Course/ Medical Decision Making/ A&P                           Medical Decision Making  Patient presents with a fall.  He is chronically anticoagulated on Coumadin.  He did strike his head and has evident facial trauma.  We will proceed with CT head, facial bones and CT C-spine for risk of significant ground-level fall in 88 year old individual.  Patient equivocal on whether or not he had a very brief syncope precipitating his fall.  He denies any preceding symptoms.  He does not experience chest pain palpitations or shortness of breath.  He did not feel that he had lost consciousness when he fell and was immediately aware of the event.  Will add EKG and basic lab work.     Patient initially minimized amount of pain in the right shoulder.  After being in the emergency department for about half hour he complained of severe pain in the right shoulder with any movement or pressure.  4 mg morphine administered for pain control.  We will be awaiting additional diagnostic  studies and will reassess for response to treatment and condition.  Patient's wife has been updated on the treatment plan and diagnostic plan.  She is at bedside with the patient.  Patient has remained stable throughout his stay in the emergency department.  Vital signs stable.  CT scan identifies a minimally displaced right maxillary sinus fracture without significant blood in the sinus.  Otherwise no CT evidence of intracranial bleeding or cervical spine injury.  Patient denies having headache.  Reports he does have some pain in his cheek.  Ports not severe.  X-ray of the shoulder does not show dislocation or fracture.  Patient does have pain with forward flexion and elevation beyond about  80 degrees.  Will place in a sling for comfort.  Recommend follow-up with PCP and referral to orthopedics if continued evidence of internal shoulder derangement limiting range of motion.  Careful return precautions reviewed.  Pain control recommendations are for extra Tylenol every 6 hours and addition of tramadol if needed. Final Clinical Impression(s) / ED Diagnoses Final diagnoses:  Fall, initial encounter  Closed fracture of maxillary sinus, initial encounter ([place])  Injury of head, initial encounter  Anticoagulated  Strain of right shoulder, initial encounter    Rx / DC Orders ED Discharge Orders          Ordered    traMADol (ULTRAM) 50 MG tablet        02/21/21 2034              Charlesetta Shanks, MD 02/21/21 2042

## 2021-02-21 NOTE — ED Notes (Signed)
Patient transported to CT 

## 2021-02-21 NOTE — ED Notes (Signed)
XR at bedside

## 2021-02-21 NOTE — ED Notes (Signed)
Trauma not activated until pt arrived to room. TRN at bedside.

## 2021-02-21 NOTE — ED Triage Notes (Signed)
Pt from work via [employer] as mechanical fall on thinners, fell up the steps & hit face. On coumadin. Denies HA, neck pain. Crepitus noted to R shoulder, ?LOC, A&O4 en route. Rolled towel for Ccollar d/t shoulder injury.  20G L forearm, pt denied pain medication  176/88 82 HR EKG unremarkable CBG 111

## 2021-02-21 NOTE — ED Notes (Signed)
Ortho tech at bedside for arm sling/immobilizer placement.

## 2021-02-21 NOTE — ED Notes (Signed)
Trauma Response Nurse Documentation   Clarence Hopkins is a 88 y.o. male arriving to [facility] ED via EMS  On warfarin daily. Trauma was activated as a Level 2 by EDP based on the following trauma criteria Elderly patients > 65 with head trauma on anti-coagulation (excluding ASA). Trauma team at the bedside on patient arrival. Patient cleared for CT by Dr. Johnney Killian. Patient to CT with team. GCS 15.  History   Past Medical History:  Diagnosis Date   Arthritis    Cancer ([place]) 2003   melanoma  L ear   GERD (gastroesophageal reflux disease)    Hypothyroidism    post op   Lung mass    Pulmonary embolism ([place]) 2005 & 2007   protein C deficiency     Past Surgical History:  Procedure Laterality Date   BRONCHIAL BRUSHINGS  07/15/2019   Procedure: BRONCHIAL BRUSHINGS;  Surgeon: Rigoberto Noel, MD;  Location: [place];  Service: Cardiopulmonary;;   BRONCHIAL WASHINGS  07/15/2019   Procedure: BRONCHIAL WASHINGS;  Surgeon: Rigoberto Noel, MD;  Location: [facility];  Service: Cardiopulmonary;;   CARDIAC CATHETERIZATION     > 20 years ago " everything was okay"    CERVICAL FUSION      X 2; Dr Vertell Limber   COLONOSCOPY     ENDOBRONCHIAL ULTRASOUND N/A 07/15/2019   Procedure: ENDOBRONCHIAL ULTRASOUND;  Surgeon: Rigoberto Noel, MD;  Location: [place];  Service: Cardiopulmonary;  Laterality: N/A;   FINE NEEDLE ASPIRATION  07/15/2019   Procedure: FINE NEEDLE ASPIRATION (FNA) LINEAR;  Surgeon: Rigoberto Noel, MD;  Location: [place];  Service: Cardiopulmonary;;   FINGER ARTHROPLASTY Right 04/17/2013   Procedure: IMPLANT ARTHROPLASTY RIGHT INDEX;  Surgeon: Cammie Sickle., MD;  Location: [place];  Service: Orthopedics;  Laterality: Right;   LUMBAR LAMINECTOMY  2010   Dr Becky Sax   MELANOMA EXCISION  2003   L ear   THYROIDECTOMY  1971   cytology indefinite   TONSILLECTOMY     TOTAL KNEE ARTHROPLASTY  2004   left   VIDEO BRONCHOSCOPY N/A 07/15/2019   Procedure: VIDEO  BRONCHOSCOPY WITHOUT FLUORO;  Surgeon: Rigoberto Noel, MD;  Location: [place];  Service: Cardiopulmonary;  Laterality: N/A;   WISDOM TOOTH EXTRACTION         Initial Focused Assessment (If applicable, or please see trauma documentation): - GCS 15 - R facial ecchymosis/swelling - R shoulder crepitus - Rolled towel in place for c-collar d/t shoulder inj - 20G to L FA  CT's Completed:   CT Head, CT Maxillofacial, and CT C-Spine   Interventions:  - CXR - CT head, face and neck - 20G PIV to L wrist - trauma labs  Plan for disposition:  Other : Awaiting results and MD decision.  Consults completed:  none at 1815.  Event Summary: Pt was walking upstairs at [employer] and fell backwards 2 steps, fell and hit his face and R shoulder.  He is unsure if he lost consciousness but thinks he may have.  He does take coumadin daily.  He is also complaining of pain to his R shoulder and R side of his face.  Pt is A/Ox4 on arrival. Wife at bedside.  MTP Summary (If applicable): n/a  Bedside handoff with ED RN Kathlee Nations.    Clovis Cao  Trauma Response RN  Please call TRN at [phone] for further assistance.

## 2021-02-23 ENCOUNTER — Other Ambulatory Visit: Payer: Self-pay

## 2021-02-23 ENCOUNTER — Ambulatory Visit (INDEPENDENT_AMBULATORY_CARE_PROVIDER_SITE_OTHER): Payer: Medicare Other | Admitting: Internal Medicine

## 2021-02-23 ENCOUNTER — Encounter: Payer: Self-pay | Admitting: Internal Medicine

## 2021-02-23 DIAGNOSIS — W108XXS Fall (on) (from) other stairs and steps, sequela: Secondary | ICD-10-CM | POA: Diagnosis not present

## 2021-02-23 DIAGNOSIS — E890 Postprocedural hypothyroidism: Secondary | ICD-10-CM | POA: Diagnosis not present

## 2021-02-23 DIAGNOSIS — K5909 Other constipation: Secondary | ICD-10-CM

## 2021-02-23 DIAGNOSIS — Z7901 Long term (current) use of anticoagulants: Secondary | ICD-10-CM

## 2021-02-23 MED ORDER — TRAMADOL HCL 50 MG PO TABS: 50.0000 mg | ORAL_TABLET | Freq: Two times a day (BID) | ORAL | 2 refills | Status: DC | PRN

## 2021-02-23 MED ORDER — WARFARIN SODIUM 5 MG PO TABS: ORAL_TABLET | ORAL | 3 refills | Status: DC

## 2021-02-23 MED ORDER — VITAMIN D3 50 MCG (2000 UT) PO CAPS: 2000.0000 [IU] | ORAL_CAPSULE | Freq: Every day | ORAL | 3 refills | Status: DC

## 2021-02-23 MED ORDER — FINASTERIDE 5 MG PO TABS: 5.0000 mg | ORAL_TABLET | Freq: Every day | ORAL | 3 refills | Status: DC

## 2021-02-23 MED ORDER — SENNA-DOCUSATE SODIUM 8.6-50 MG PO TABS: 1.0000 | ORAL_TABLET | Freq: Every day | ORAL | 3 refills | Status: DC

## 2021-02-23 MED ORDER — TAMSULOSIN HCL 0.4 MG PO CAPS: ORAL_CAPSULE | ORAL | 3 refills | Status: DC

## 2021-02-23 MED ORDER — LEVOTHYROXINE SODIUM 175 MCG PO TABS: 175.0000 ug | ORAL_TABLET | Freq: Every day | ORAL | 3 refills | Status: DC

## 2021-02-23 NOTE — Progress Notes (Signed)
Subjective:  Patient ID: Clarence Hopkins, male    DOB: 1930-07-01  Age: 88 y.o. MRN: [id number]  CC: Follow-up [facility] f/u) and Medication Problem ( Wife states brought husband medicines not sure what he needs to take)   HPI Clarence Hopkins presents for a s/p a fall on 02/21/21 at the store front stairs. He was given Tramadol  Per ER: "Patient presents with a fall.  He is chronically anticoagulated on Coumadin.  He did strike his head and has evident facial trauma.  We will proceed with CT head, facial bones and CT C-spine for risk of significant ground-level fall in 88 year old individual.  Patient equivocal on whether or not he had a very brief syncope precipitating his fall.  He denies any preceding symptoms.  He does not experience chest pain palpitations or shortness of breath.  He did not feel that he had lost consciousness when he fell and was immediately aware of the event.  Will add EKG and basic lab work.      Patient initially minimized amount of pain in the right shoulder.  After being in the emergency department for about half hour he complained of severe pain in the right shoulder with any movement or pressure.  4 mg morphine administered for pain control.  We will be awaiting additional diagnostic studies and will reassess for response to treatment and condition.   Patient's wife has been updated on the treatment plan and diagnostic plan.  She is at bedside with the patient.   Patient has remained stable throughout his stay in the emergency department.  Vital signs stable.  CT scan identifies a minimally displaced right maxillary sinus fracture without significant blood in the sinus.  Otherwise no CT evidence of intracranial bleeding or cervical spine injury.  Patient denies having headache.  Reports he does have some pain in his cheek.  Ports not severe.  X-ray of the shoulder does not show dislocation or fracture.  Patient does have pain with forward flexion and elevation beyond about 80  degrees.  Will place in a sling for comfort.  Recommend follow-up with PCP and referral to orthopedics if continued evidence of internal shoulder derangement limiting range of motion."    Follow-up on nausea constipation, hypertension, cancer.  The patient is here with his wife who helps with history  Outpatient Medications Prior to Visit  Medication Sig Dispense Refill   mupirocin ointment (BACTROBAN) 2 % Apply 1 application topically daily.     polyvinyl alcohol (LIQUIFILM TEARS) 1.4 % ophthalmic solution Place 1 drop into both eyes as needed for dry eyes.     bisacodyl (DULCOLAX) 10 MG suppository Place 1 suppository (10 mg total) rectally as needed for moderate constipation. 12 suppository 0   bisacodyl (DULCOLAX) 5 MG EC tablet Take 1 tablet (5 mg total) by mouth daily. 30 tablet 0   Cholecalciferol 25 MCG (1000 UT) tablet Take 1,000 Units by mouth daily.     levothyroxine (SYNTHROID) 175 MCG tablet Take 175 mcg by mouth daily before breakfast.     melatonin 5 MG TABS Take 5 mg by mouth at bedtime.     tamsulosin (FLOMAX) 0.4 MG CAPS capsule TAKE 1 CAPSULE BY MOUTH DAILY AFTER SUPPER 90 capsule 3   tolterodine (DETROL LA) 4 MG 24 hr capsule Take 1 capsule (4 mg total) by mouth daily. 30 capsule 1   traMADol (ULTRAM) 50 MG tablet 1-2 tablets every 6 hours as needed for pain 20 tablet 0  Vibegron (GEMTESA) 75 MG TABS Take 1 tablet by mouth daily as needed (overactive bladder).     warfarin (COUMADIN) 5 MG tablet TAKE 1/2 TABLET DAILY BY MOUTH EXCEPT TAKE 1 TABLET ON MONDAYS AND THURSDAYS OR AS DIRECTED BY ANTICOAGULATION CLINIC (Patient taking differently: Take 5 mg by mouth as directed. TAKE 1/2 TABLET DAILY BY MOUTH EXCEPT TAKE 1 TABLET ON MONDAYS AND THURSDAYS OR AS DIRECTED BY ANTICOAGULATION CLINIC) 45 tablet 3   amoxicillin-clavulanate (AUGMENTIN) 500-125 MG tablet Take 1 tablet by mouth 2 (two) times daily. (Patient not taking: Reported on 02/23/2021)     diltiazem 2 % GEL Apply 1  application topically 3 (three) times daily for 7 days. apply a small amount inside the anal opening and to the external anal area tid for 2 weeks (Patient not taking: Reported on 02/21/2021) 21 g 0   doxycycline (VIBRA-TABS) 100 MG tablet Take 100 mg by mouth 2 (two) times daily. (Patient not taking: Reported on 02/23/2021)     finasteride (PROSCAR) 5 MG tablet Take 5 mg by mouth daily. (Patient not taking: Reported on 02/23/2021)     No facility-administered medications prior to visit.    ROS: Review of Systems  Constitutional:  Positive for fatigue. Negative for appetite change and unexpected weight change.  HENT:  Negative for congestion, nosebleeds, sneezing, sore throat and trouble swallowing.   Eyes:  Negative for itching and visual disturbance.  Respiratory:  Negative for cough.   Cardiovascular:  Negative for chest pain, palpitations and leg swelling.  Gastrointestinal:  Positive for constipation. Negative for abdominal distention, blood in stool, diarrhea and nausea.  Genitourinary:  Negative for frequency and hematuria.  Musculoskeletal:  Positive for back pain and gait problem. Negative for joint swelling and neck pain.  Skin:  Negative for rash.  Neurological:  Negative for dizziness, tremors, speech difficulty, weakness, light-headedness and headaches.  Psychiatric/Behavioral:  Negative for agitation, decreased concentration, dysphoric mood, sleep disturbance and suicidal ideas. The patient is not nervous/anxious.    Objective:  BP (!) 142/72 (BP Location: Left Arm)    Pulse 65    Temp 97.9 F (36.6 C) (Oral)    SpO2 95%   BP Readings from Last 3 Encounters:  02/23/21 (!) 142/72  02/21/21 (!) 148/78  01/17/21 133/87    Wt Readings from Last 3 Encounters:  02/21/21 203 lb (92.1 kg)  01/17/21 199 lb (90.3 kg)  12/03/20 100 lb (45.4 kg)    Physical Exam Constitutional:      General: He is not in acute distress.    Appearance: He is well-developed.     Comments: NAD   Eyes:     Conjunctiva/sclera: Conjunctivae normal.     Pupils: Pupils are equal, round, and reactive to light.  Neck:     Thyroid: No thyromegaly.     Vascular: No JVD.  Cardiovascular:     Rate and Rhythm: Normal rate and regular rhythm.     Heart sounds: Normal heart sounds. No murmur heard.   No friction rub. No gallop.  Pulmonary:     Effort: Pulmonary effort is normal. No respiratory distress.     Breath sounds: Normal breath sounds. No wheezing or rales.  Chest:     Chest wall: No tenderness.  Abdominal:     General: Bowel sounds are normal. There is no distension.     Palpations: Abdomen is soft. There is no mass.     Tenderness: There is no abdominal tenderness. There is no guarding or  rebound.  Musculoskeletal:        General: Tenderness present. Normal range of motion.     Cervical back: Normal range of motion.  Lymphadenopathy:     Cervical: No cervical adenopathy.  Skin:    General: Skin is warm and dry.     Findings: No rash.  Neurological:     Mental Status: He is alert and oriented to person, place, and time.     Cranial Nerves: No cranial nerve deficit.     Motor: No abnormal muscle tone.     Coordination: Coordination normal.     Gait: Gait abnormal.     Deep Tendon Reflexes: Reflexes are normal and symmetric.  Psychiatric:        Behavior: Behavior normal.        Thought Content: Thought content normal.        Judgment: Judgment normal.   Stiff joints Ataxic gait Right cheek bone bruise    A total time of 45 minutes was spent preparing to see the patient, reviewing tests, x-rays, operative reports and other medical records.  Also, obtaining history and performing comprehensive physical exam.  Additionally, counseling the patient regarding the above listed issues.   Finally, documenting clinical information in the health records, coordination of care, educating the patient. It is a complex case.  Lab Results  Component Value Date   WBC 6.5 02/21/2021    HGB 12.7 (L) 02/21/2021   HCT 41.0 02/21/2021   PLT 202 02/21/2021   GLUCOSE 92 02/21/2021   CHOL 141 03/14/2019   TRIG 74.0 03/14/2019   HDL 38.00 (L) 03/14/2019   LDLDIRECT 116.0 03/31/2013   LDLCALC 88 03/14/2019   ALT 16 01/17/2021   AST 21 01/17/2021   NA 135 02/21/2021   K 4.2 02/21/2021   CL 103 02/21/2021   CREATININE 1.17 02/21/2021   BUN 16 02/21/2021   CO2 25 02/21/2021   TSH 15.168 (H) 11/23/2020   PSA 9.86 (H) 03/14/2019   INR 1.6 (H) 02/21/2021    DG Shoulder Right  Result Date: 02/21/2021 CLINICAL DATA:  Fall. EXAM: RIGHT SHOULDER - 2+ VIEW COMPARISON:  None. FINDINGS: There is no evidence for acute fracture or dislocation. There are moderate severe degenerative changes of the acromioclavicular joint with joint space narrowing, sclerosis and osteophyte formation. There are mild degenerative changes of the glenohumeral joint space with joint space narrowing and osteophyte formation. There is soft tissue calcification superior to the greater tuberosity compatible with calcific tendinitis. IMPRESSION: 1. No acute fracture or dislocation. 2. Moderate severe degenerative changes of the shoulder including calcific tendinitis. Electronically Signed   By: Ronney Asters M.D.   On: 02/21/2021 17:20   CT Head Wo Contrast  Result Date: 02/21/2021 CLINICAL DATA:  Head trauma, minor (Age >= 65y); Neck trauma (Age >= 65y); Facial trauma, blunt. Mechanical fall on thinners, fell up the steps AND hit face. EXAM: CT HEAD WITHOUT CONTRAST CT MAXILLOFACIAL WITHOUT CONTRAST CT CERVICAL SPINE WITHOUT CONTRAST TECHNIQUE: Multidetector CT imaging of the head, cervical spine, and maxillofacial structures were performed using the standard protocol without intravenous contrast. Multiplanar CT image reconstructions of the cervical spine and maxillofacial structures were also generated. COMPARISON:  None. FINDINGS: CT HEAD FINDINGS BRAIN: BRAIN Cerebral ventricle sizes are concordant with the degree  of cerebral volume loss. Patchy and confluent areas of decreased attenuation are noted throughout the deep and periventricular white matter of the cerebral hemispheres bilaterally, compatible with chronic microvascular ischemic disease. No evidence of large-territorial acute  infarction. No parenchymal hemorrhage. No mass lesion. No extra-axial collection. No mass effect or midline shift. No hydrocephalus. Basilar cisterns are patent. Vascular: No hyperdense vessel. Atherosclerotic calcifications are present within the cavernous internal carotid and vertebral arteries. Skull: No acute fracture or focal lesion. Other: None. CT MAXILLOFACIAL FINDINGS Osseous: Acute minimally displaced right anterior maxillary wall fracture. Sinuses/Orbits: Mucosal thickening of the right maxillary sinus. Otherwise paranasal sinuses and mastoid air cells are clear. The orbits are unremarkable. Soft tissues: Mild right maxillary subcutaneus soft tissue edema with no large hematoma formation. CT CERVICAL SPINE FINDINGS Alignment: Straightening of the normal cervical lordosis likely due to positioning and surgical hardware. Skull base and vertebrae: C3-C4 anterior cervical spine fusion surgical hardware. C5-C7 anterior cervical spine fusion surgical hardware. Multilevel osseous neuroforaminal stenosis. No acute fracture. No aggressive appearing focal osseous lesion or focal pathologic process. Soft tissues and spinal canal: No prevertebral fluid or swelling. No visible canal hematoma. Upper chest: Unremarkable. Other: None. IMPRESSION: 1.  No acute intracranial abnormality. 2. Acute minimally displaced right anterior maxillary wall fracture. 3. No acute displaced fracture or traumatic listhesis of the cervical spine. 4. C3-C4 anterior cervical spine fusion surgical hardware. C5-C7 anterior cervical spine fusion surgical hardware. Multilevel osseous neuroforaminal stenosis. Electronically Signed   By: Iven Finn M.D.   On: 02/21/2021  17:49   CT Cervical Spine Wo Contrast  Result Date: 02/21/2021 CLINICAL DATA:  Head trauma, minor (Age >= 65y); Neck trauma (Age >= 65y); Facial trauma, blunt. Mechanical fall on thinners, fell up the steps AND hit face. EXAM: CT HEAD WITHOUT CONTRAST CT MAXILLOFACIAL WITHOUT CONTRAST CT CERVICAL SPINE WITHOUT CONTRAST TECHNIQUE: Multidetector CT imaging of the head, cervical spine, and maxillofacial structures were performed using the standard protocol without intravenous contrast. Multiplanar CT image reconstructions of the cervical spine and maxillofacial structures were also generated. COMPARISON:  None. FINDINGS: CT HEAD FINDINGS BRAIN: BRAIN Cerebral ventricle sizes are concordant with the degree of cerebral volume loss. Patchy and confluent areas of decreased attenuation are noted throughout the deep and periventricular white matter of the cerebral hemispheres bilaterally, compatible with chronic microvascular ischemic disease. No evidence of large-territorial acute infarction. No parenchymal hemorrhage. No mass lesion. No extra-axial collection. No mass effect or midline shift. No hydrocephalus. Basilar cisterns are patent. Vascular: No hyperdense vessel. Atherosclerotic calcifications are present within the cavernous internal carotid and vertebral arteries. Skull: No acute fracture or focal lesion. Other: None. CT MAXILLOFACIAL FINDINGS Osseous: Acute minimally displaced right anterior maxillary wall fracture. Sinuses/Orbits: Mucosal thickening of the right maxillary sinus. Otherwise paranasal sinuses and mastoid air cells are clear. The orbits are unremarkable. Soft tissues: Mild right maxillary subcutaneus soft tissue edema with no large hematoma formation. CT CERVICAL SPINE FINDINGS Alignment: Straightening of the normal cervical lordosis likely due to positioning and surgical hardware. Skull base and vertebrae: C3-C4 anterior cervical spine fusion surgical hardware. C5-C7 anterior cervical spine  fusion surgical hardware. Multilevel osseous neuroforaminal stenosis. No acute fracture. No aggressive appearing focal osseous lesion or focal pathologic process. Soft tissues and spinal canal: No prevertebral fluid or swelling. No visible canal hematoma. Upper chest: Unremarkable. Other: None. IMPRESSION: 1.  No acute intracranial abnormality. 2. Acute minimally displaced right anterior maxillary wall fracture. 3. No acute displaced fracture or traumatic listhesis of the cervical spine. 4. C3-C4 anterior cervical spine fusion surgical hardware. C5-C7 anterior cervical spine fusion surgical hardware. Multilevel osseous neuroforaminal stenosis. Electronically Signed   By: Iven Finn M.D.   On: 02/21/2021 17:49   DG  Chest Port 1 View  Result Date: 02/21/2021 CLINICAL DATA:  Fall. EXAM: PORTABLE CHEST 1 VIEW COMPARISON:  Chest x-ray 04/30/2018.  CT of the chest 01/17/2021. FINDINGS: There are few nodular densities in the lower lungs similar to prior CT, although not well defined on this exam. There is no new focal lung infiltrate. The cardiomediastinal silhouette is within normal limits and stable. There is no pneumothorax. There is a small left pleural effusion. No acute fractures are seen. Cervical spinal fusion plate is present. IMPRESSION: 1. Small left pleural effusion. 2. No evidence for focal pneumonia. 3. Grossly unchanged nodular densities in the lower lungs. Electronically Signed   By: Ronney Asters M.D.   On: 02/21/2021 17:21   CT Maxillofacial WO CM  Result Date: 02/21/2021 CLINICAL DATA:  Head trauma, minor (Age >= 65y); Neck trauma (Age >= 65y); Facial trauma, blunt. Mechanical fall on thinners, fell up the steps AND hit face. EXAM: CT HEAD WITHOUT CONTRAST CT MAXILLOFACIAL WITHOUT CONTRAST CT CERVICAL SPINE WITHOUT CONTRAST TECHNIQUE: Multidetector CT imaging of the head, cervical spine, and maxillofacial structures were performed using the standard protocol without intravenous contrast.  Multiplanar CT image reconstructions of the cervical spine and maxillofacial structures were also generated. COMPARISON:  None. FINDINGS: CT HEAD FINDINGS BRAIN: BRAIN Cerebral ventricle sizes are concordant with the degree of cerebral volume loss. Patchy and confluent areas of decreased attenuation are noted throughout the deep and periventricular white matter of the cerebral hemispheres bilaterally, compatible with chronic microvascular ischemic disease. No evidence of large-territorial acute infarction. No parenchymal hemorrhage. No mass lesion. No extra-axial collection. No mass effect or midline shift. No hydrocephalus. Basilar cisterns are patent. Vascular: No hyperdense vessel. Atherosclerotic calcifications are present within the cavernous internal carotid and vertebral arteries. Skull: No acute fracture or focal lesion. Other: None. CT MAXILLOFACIAL FINDINGS Osseous: Acute minimally displaced right anterior maxillary wall fracture. Sinuses/Orbits: Mucosal thickening of the right maxillary sinus. Otherwise paranasal sinuses and mastoid air cells are clear. The orbits are unremarkable. Soft tissues: Mild right maxillary subcutaneus soft tissue edema with no large hematoma formation. CT CERVICAL SPINE FINDINGS Alignment: Straightening of the normal cervical lordosis likely due to positioning and surgical hardware. Skull base and vertebrae: C3-C4 anterior cervical spine fusion surgical hardware. C5-C7 anterior cervical spine fusion surgical hardware. Multilevel osseous neuroforaminal stenosis. No acute fracture. No aggressive appearing focal osseous lesion or focal pathologic process. Soft tissues and spinal canal: No prevertebral fluid or swelling. No visible canal hematoma. Upper chest: Unremarkable. Other: None. IMPRESSION: 1.  No acute intracranial abnormality. 2. Acute minimally displaced right anterior maxillary wall fracture. 3. No acute displaced fracture or traumatic listhesis of the cervical spine. 4.  C3-C4 anterior cervical spine fusion surgical hardware. C5-C7 anterior cervical spine fusion surgical hardware. Multilevel osseous neuroforaminal stenosis. Electronically Signed   By: Iven Finn M.D.   On: 02/21/2021 17:49      A total time of 46 minutes was spent preparing to see the patient, reviewing tests, x-rays, ER reports and other medical records.  Also, obtaining history from the patient and his wife and performing comprehensive physical exam.  Additionally, counseling the patient regarding the above listed issues.  We have sorted out several years medications and consolidated them.  Finally, documenting clinical information in the health records, coordination of care, educating the patient. It is a complex case.   Assessment & Plan:   Problem List Items Addressed This Visit     Chronic constipation    Continue with our current  therapy with vegetable supplement which is quite affecting according to [place].  Discontinue bisacodyl.      Relevant Medications   sennosides-docusate sodium (SENOKOT-S) 8.6-50 MG tablet   Fall (on) (from) other stairs and steps, sequela    Status post mechanical fall.  Contusions.  Use Arnica ointment for bruising.  R cheek bone contusion with possible fracture.  No loss of consciousness.  Denies other symptoms of concussion.      Long term (current) use of anticoagulants    Coumadin clinic.  No signs of bleeding -  will observe.      Post-surgical hypothyroidism    Thyroid cancer.  Continue with Levothroid      Relevant Medications   levothyroxine (SYNTHROID) 175 MCG tablet      Meds ordered this encounter  Medications   Cholecalciferol (VITAMIN D3) 50 MCG (2000 UT) capsule    Sig: Take 1 capsule (2,000 Units total) by mouth daily.    Dispense:  100 capsule    Refill:  3   levothyroxine (SYNTHROID) 175 MCG tablet    Sig: Take 1 tablet (175 mcg total) by mouth daily before breakfast.    Dispense:  90 tablet    Refill:  3   DISCONTD:  tamsulosin (FLOMAX) 0.4 MG CAPS capsule    Sig: 1 po qhs    Dispense:  90 capsule    Refill:  3   warfarin (COUMADIN) 5 MG tablet    Sig: Take 5 mg on  Tue, Thur, Sat, Sun. Take 2.5 mg on Mon, Wed, Fri    Dispense:  100 tablet    Refill:  3   finasteride (PROSCAR) 5 MG tablet    Sig: Take 1 tablet (5 mg total) by mouth daily.    Dispense:  90 tablet    Refill:  3   sennosides-docusate sodium (SENOKOT-S) 8.6-50 MG tablet    Sig: Take 1 tablet by mouth daily.    Dispense:  100 tablet    Refill:  3   traMADol (ULTRAM) 50 MG tablet    Sig: Take 1 tablet (50 mg total) by mouth 2 (two) times daily as needed for severe pain or moderate pain.    Dispense:  60 tablet    Refill:  2   tamsulosin (FLOMAX) 0.4 MG CAPS capsule    Sig: 1 po qhs    Dispense:  90 capsule    Refill:  3      Follow-up: Return in about 3 months (around 05/24/2021) for a follow-up visit.  Walker Kehr, MD

## 2021-02-27 ENCOUNTER — Encounter: Payer: Self-pay | Admitting: Internal Medicine

## 2021-02-27 DIAGNOSIS — K5909 Other constipation: Secondary | ICD-10-CM | POA: Insufficient documentation

## 2021-02-27 NOTE — Assessment & Plan Note (Signed)
Thyroid cancer.  Continue with Levothroid

## 2021-02-27 NOTE — Assessment & Plan Note (Signed)
Coumadin clinic.  No signs of bleeding -  will observe.

## 2021-02-27 NOTE — Assessment & Plan Note (Signed)
Status post mechanical fall.  Contusions.  Use Arnica ointment for bruising.  R cheek bone contusion with possible fracture.  No loss of consciousness.  Denies other symptoms of concussion.

## 2021-02-27 NOTE — Assessment & Plan Note (Signed)
Continue with our current therapy with vegetable supplement which is quite affecting according to [place].  Discontinue bisacodyl.

## 2021-03-15 ENCOUNTER — Other Ambulatory Visit: Payer: Self-pay

## 2021-03-15 ENCOUNTER — Ambulatory Visit (INDEPENDENT_AMBULATORY_CARE_PROVIDER_SITE_OTHER): Payer: Medicare Other

## 2021-03-15 DIAGNOSIS — Z7901 Long term (current) use of anticoagulants: Secondary | ICD-10-CM

## 2021-03-15 LAB — POCT INR: INR: 2.3 (ref 2.0–3.0)

## 2021-03-15 NOTE — Progress Notes (Addendum)
Continue 5mg  daily except take 2.5 mg on Mondays, Wednesdays and Fridays. Recheck in 4 weeks.  Pt reports Heather, Therapist, sports, at [employer] is still controlling his medication and placing it in a pill box so she will need to be contacted with any changes.  Contacted Autumn and Nira Conn at [facility] at [phone] to advised to keep current warfarin dosing. They reported they are not filling the pt's pill box any longer. They report the pt's wife has been doing that for about 30 days. She is doing this herself for financial reasons.   Contacted [name], and advised to continue current dosing. She read back dosing and verbalized understanding.   Medical screening examination/treatment/procedure(s) were performed by non-physician practitioner and as supervising physician I was immediately available for consultation/collaboration.  I agree with above. Lew Dawes, MD

## 2021-03-15 NOTE — Patient Instructions (Addendum)
Pre visit review using our clinic review tool, if applicable. No additional management support is needed unless otherwise documented below in the visit note.  Continue 5mg  daily except take 2.5 mg on Mondays, Wednesdays and Fridays. Recheck in 4 weeks.

## 2021-03-17 DIAGNOSIS — Z85820 Personal history of malignant melanoma of skin: Secondary | ICD-10-CM | POA: Diagnosis not present

## 2021-03-17 DIAGNOSIS — R338 Other retention of urine: Secondary | ICD-10-CM | POA: Diagnosis not present

## 2021-03-17 DIAGNOSIS — L570 Actinic keratosis: Secondary | ICD-10-CM | POA: Diagnosis not present

## 2021-03-17 DIAGNOSIS — L821 Other seborrheic keratosis: Secondary | ICD-10-CM | POA: Diagnosis not present

## 2021-03-17 DIAGNOSIS — N401 Enlarged prostate with lower urinary tract symptoms: Secondary | ICD-10-CM | POA: Diagnosis not present

## 2021-03-17 DIAGNOSIS — Z85828 Personal history of other malignant neoplasm of skin: Secondary | ICD-10-CM | POA: Diagnosis not present

## 2021-03-23 DIAGNOSIS — E890 Postprocedural hypothyroidism: Secondary | ICD-10-CM | POA: Diagnosis not present

## 2021-04-11 ENCOUNTER — Encounter: Payer: Self-pay | Admitting: Hematology & Oncology

## 2021-04-11 ENCOUNTER — Inpatient Hospital Stay: Payer: Medicare Other | Attending: Hematology & Oncology

## 2021-04-11 ENCOUNTER — Ambulatory Visit (HOSPITAL_BASED_OUTPATIENT_CLINIC_OR_DEPARTMENT_OTHER)
Admission: RE | Admit: 2021-04-11 | Discharge: 2021-04-11 | Disposition: A | Discharge status: Home / Self Care | Payer: Medicare Other | Source: Ambulatory Visit | Attending: Hematology & Oncology | Admitting: Hematology & Oncology

## 2021-04-11 ENCOUNTER — Inpatient Hospital Stay (HOSPITAL_BASED_OUTPATIENT_CLINIC_OR_DEPARTMENT_OTHER): Payer: Medicare Other | Admitting: Hematology & Oncology

## 2021-04-11 ENCOUNTER — Encounter (HOSPITAL_BASED_OUTPATIENT_CLINIC_OR_DEPARTMENT_OTHER): Payer: Self-pay

## 2021-04-11 ENCOUNTER — Other Ambulatory Visit: Payer: Self-pay

## 2021-04-11 VITALS — BP 137/74 | HR 57 | Temp 97.7°F | Resp 20 | Wt 200.0 lb

## 2021-04-11 DIAGNOSIS — Z7901 Long term (current) use of anticoagulants: Secondary | ICD-10-CM | POA: Insufficient documentation

## 2021-04-11 DIAGNOSIS — C73 Malignant neoplasm of thyroid gland: Secondary | ICD-10-CM | POA: Insufficient documentation

## 2021-04-11 DIAGNOSIS — N400 Enlarged prostate without lower urinary tract symptoms: Secondary | ICD-10-CM | POA: Diagnosis not present

## 2021-04-11 DIAGNOSIS — Z9181 History of falling: Secondary | ICD-10-CM | POA: Diagnosis not present

## 2021-04-11 LAB — CBC WITH DIFFERENTIAL (CANCER CENTER ONLY)
Abs Immature Granulocytes: 0.02 10*3/uL (ref 0.00–0.07)
Basophils Absolute: 0 10*3/uL (ref 0.0–0.1)
Basophils Relative: 1 %
Eosinophils Absolute: 0.1 10*3/uL (ref 0.0–0.5)
Eosinophils Relative: 3 %
HCT: 41.5 % (ref 39.0–52.0)
Hemoglobin: 12.9 g/dL — ABNORMAL LOW (ref 13.0–17.0)
Immature Granulocytes: 1 %
Lymphocytes Relative: 32 %
Lymphs Abs: 1.4 10*3/uL (ref 0.7–4.0)
MCH: 27.3 pg (ref 26.0–34.0)
MCHC: 31.1 g/dL (ref 30.0–36.0)
MCV: 87.7 fL (ref 80.0–100.0)
Monocytes Absolute: 0.5 10*3/uL (ref 0.1–1.0)
Monocytes Relative: 11 %
Neutro Abs: 2.3 10*3/uL (ref 1.7–7.7)
Neutrophils Relative %: 52 %
Platelet Count: 202 10*3/uL (ref 150–400)
RBC: 4.73 MIL/uL (ref 4.22–5.81)
RDW: 12.9 % (ref 11.5–15.5)
WBC Count: 4.4 10*3/uL (ref 4.0–10.5)
nRBC: 0 % (ref 0.0–0.2)

## 2021-04-11 LAB — CMP (CANCER CENTER ONLY)
ALT: 13 U/L (ref 0–44)
AST: 18 U/L (ref 15–41)
Albumin: 3.6 g/dL (ref 3.5–5.0)
Alkaline Phosphatase: 85 U/L (ref 38–126)
Anion gap: 4 — ABNORMAL LOW (ref 5–15)
BUN: 17 mg/dL (ref 8–23)
CO2: 32 mmol/L (ref 22–32)
Calcium: 9.1 mg/dL (ref 8.9–10.3)
Chloride: 101 mmol/L (ref 98–111)
Creatinine: 1.12 mg/dL (ref 0.61–1.24)
GFR, Estimated: >60 mL/min (ref >60)
Glucose, Bld: 92 mg/dL (ref 70–99)
Potassium: 4.6 mmol/L (ref 3.5–5.1)
Sodium: 137 mmol/L (ref 135–145)
Total Bilirubin: 0.7 mg/dL (ref 0.3–1.2)
Total Protein: 6.6 g/dL (ref 6.5–8.1)

## 2021-04-11 LAB — LACTATE DEHYDROGENASE: LDH: 132 U/L (ref 98–192)

## 2021-04-11 MED ORDER — IOHEXOL 300 MG/ML  SOLN
100.0000 mL | Freq: Once | INTRAMUSCULAR | Status: AC | PRN
  Administered 2021-04-11: 100 mL via INTRAVENOUS

## 2021-04-11 NOTE — Progress Notes (Signed)
Hematology and Oncology Follow Up Visit  COMPTON BRIGANCE [id number] [birth year] 88 y.o. 04/11/2021   Principle Diagnosis:  Metastatic papillary carcinoma of the thyroid  Current Therapy:   S/p I-131 therapy on 08/27/2019     Interim History:  Mr. Beagle is back for follow-up.  Unfortunately, since last time we saw him, he fell.  This was in early January.  Thankfully, he did not bleed.  He is on Coumadin.  He was not admitted.  He did not break anything.  We did go ahead and do a CT scan on him today.  This is for his thyroid cancer.  Thankfully, the CT scan not show any evidence of growth.  There is no new areas of tumor.  He is doing pretty well right now.  He is worried about a growth under his right eye.  He has had this operated on before.  It looks like it has come back.  I told him that he probably needs to have this removed.  I will see a problem with him having this from my standpoint.  The only issue was that he is going to be off his Coumadin.  I am sure cardiology will assess this.  He has had no problems with rashes.  Is been no leg swelling.  He has had no problems with bowels or bladder.  Overall, his performance status is ECOG 2.   Medications:  Current Outpatient Medications:    Cholecalciferol (VITAMIN D3) 50 MCG (2000 UT) capsule, Take 1 capsule (2,000 Units total) by mouth daily., Disp: 100 capsule, Rfl: 3   finasteride (PROSCAR) 5 MG tablet, Take 1 tablet (5 mg total) by mouth daily., Disp: 90 tablet, Rfl: 3   levothyroxine (SYNTHROID) 175 MCG tablet, Take 1 tablet (175 mcg total) by mouth daily before breakfast., Disp: 90 tablet, Rfl: 3   polyvinyl alcohol (LIQUIFILM TEARS) 1.4 % ophthalmic solution, Place 1 drop into both eyes as needed for dry eyes., Disp: , Rfl:    sennosides-docusate sodium (SENOKOT-S) 8.6-50 MG tablet, Take 1 tablet by mouth daily., Disp: 100 tablet, Rfl: 3   tamsulosin (FLOMAX) 0.4 MG CAPS capsule, 1 po qhs, Disp: 90 capsule, Rfl: 3   warfarin  (COUMADIN) 5 MG tablet, Take 5 mg on  Tue, Thur, Sat, Sun. Take 2.5 mg on Mon, Wed, Fri, Disp: 100 tablet, Rfl: 3   mupirocin ointment (BACTROBAN) 2 %, Apply 1 application topically daily., Disp: , Rfl:    traMADol (ULTRAM) 50 MG tablet, Take 1 tablet (50 mg total) by mouth 2 (two) times daily as needed for severe pain or moderate pain. (Patient not taking: Reported on 04/11/2021), Disp: 60 tablet, Rfl: 2  Allergies: No Known Allergies  Past Medical History, Surgical history, Social history, and Family History were reviewed and updated.  Review of Systems: Review of Systems  Constitutional: Negative.   HENT:  Negative.    Eyes: Negative.   Respiratory: Negative.    Cardiovascular: Negative.   Gastrointestinal: Negative.   Endocrine: Negative.   Genitourinary: Negative.    Musculoskeletal: Negative.   Skin: Negative.   Neurological: Negative.   Hematological: Negative.   Psychiatric/Behavioral: Negative.     Physical Exam:  weight is 200 lb (90.7 kg). His oral temperature is 97.7 F (36.5 C). His blood pressure is 137/74 and his pulse is 57 (abnormal). His respiration is 20 and oxygen saturation is 99%.   Wt Readings from Last 3 Encounters:  04/11/21 200 lb (90.7 kg)  02/21/21 203 lb (  92.1 kg)  01/17/21 199 lb (90.3 kg)    Physical Exam Vitals reviewed.  HENT:     Head: Normocephalic and atraumatic.  Eyes:     Pupils: Pupils are equal, round, and reactive to light.  Cardiovascular:     Heart sounds: Normal heart sounds.     Comments: Cardiac exam is slow but regular.  He does have occasional extra beats.  He does have an occasional skipped beat.  He has 1/6 systolic ejection murmur. Pulmonary:     Effort: Pulmonary effort is normal.     Breath sounds: Normal breath sounds.  Abdominal:     General: Bowel sounds are normal.     Palpations: Abdomen is soft.  Musculoskeletal:        General: No tenderness or deformity. Normal range of motion.     Cervical back: Normal  range of motion.  Lymphadenopathy:     Cervical: No cervical adenopathy.  Skin:    General: Skin is warm and dry.     Findings: No erythema or rash.  Neurological:     Mental Status: He is alert and oriented to person, place, and time.  Psychiatric:        Behavior: Behavior normal.        Thought Content: Thought content normal.        Judgment: Judgment normal.     Lab Results  Component Value Date   WBC 4.4 04/11/2021   HGB 12.9 (L) 04/11/2021   HCT 41.5 04/11/2021   MCV 87.7 04/11/2021   PLT 202 04/11/2021     Chemistry      Component Value Date/Time   NA 137 04/11/2021 0948   NA 142 11/29/2020 0000   K 4.6 04/11/2021 0948   CL 101 04/11/2021 0948   CO2 32 04/11/2021 0948   BUN 17 04/11/2021 0948   BUN 19 11/29/2020 0000   CREATININE 1.12 04/11/2021 0948   GLU 95 11/29/2020 0000      Component Value Date/Time   CALCIUM 9.1 04/11/2021 0948   ALKPHOS 85 04/11/2021 0948   AST 18 04/11/2021 0948   ALT 13 04/11/2021 0948   BILITOT 0.7 04/11/2021 0948      Impression and Plan: Mr. Goetting is a very nice 88 year old white male.  He has metastatic thyroid cancer.  This is differentiated thyroid cancer.  Thankfully it is responsive to the radioactive iodine.  He is still responding.  He sees Dr. Hartford Poli for treatment of the thyroid cancer.  From my point of view he has not seen any problems with growth of thyroid cancer.  The CT scan is quite helpful for this.  I will repeat another CT scan in about 3 months.  If that scan looks fine, then we will try to get him to all of summer.  Hopefully, he will have any problems from his prostate.  I know he has had an enlarged prostate.  I will plan to see him back right after memorial day.  We will get a scan the same-see him    [name], MD 2/27/202312:46 PM

## 2021-04-12 ENCOUNTER — Telehealth: Payer: Self-pay

## 2021-04-12 ENCOUNTER — Ambulatory Visit: Payer: Medicare Other

## 2021-04-12 NOTE — Telephone Encounter (Signed)
Pt had apt scheduled today at [facility] for 11:30 and NS. LVM on both numbers to call back to RS.

## 2021-04-14 NOTE — Telephone Encounter (Signed)
RS for [facility] on 3/6. Pt verbalized understanding ?

## 2021-04-18 ENCOUNTER — Ambulatory Visit (INDEPENDENT_AMBULATORY_CARE_PROVIDER_SITE_OTHER): Payer: Medicare Other

## 2021-04-18 DIAGNOSIS — Z7901 Long term (current) use of anticoagulants: Secondary | ICD-10-CM | POA: Diagnosis not present

## 2021-04-18 LAB — POCT INR: INR: 1.6 — AB (ref 2.0–3.0)

## 2021-04-18 NOTE — Patient Instructions (Addendum)
Pre visit review using our clinic review tool, if applicable. No additional management support is needed unless otherwise documented below in the visit note. ? ?Increase dose today to take 5 mg  and increase dose tomorrow to take 7.5 mg and then continue 5mg  daily except take 2.5 mg on Mondays, Wednesdays and Fridays. Recheck in 2 weeks.  ?

## 2021-04-18 NOTE — Progress Notes (Signed)
Agree with management ? ?Eulas Post MD ?[facility] [place] ? ?

## 2021-04-18 NOTE — Progress Notes (Addendum)
Increase dose today to take 5 mg  and increase dose tomorrow to take 7.5 mg and then continue 5mg  daily except take 2.5 mg on Mondays, Wednesdays and Fridays. Recheck in 2 weeks.  ?

## 2021-05-03 ENCOUNTER — Ambulatory Visit: Payer: Medicare Other

## 2021-05-05 DIAGNOSIS — E890 Postprocedural hypothyroidism: Secondary | ICD-10-CM | POA: Diagnosis not present

## 2021-05-06 ENCOUNTER — Other Ambulatory Visit: Payer: Self-pay

## 2021-05-06 ENCOUNTER — Ambulatory Visit (INDEPENDENT_AMBULATORY_CARE_PROVIDER_SITE_OTHER): Payer: Medicare Other

## 2021-05-06 DIAGNOSIS — Z7901 Long term (current) use of anticoagulants: Secondary | ICD-10-CM

## 2021-05-06 LAB — POCT INR: INR: 1.7 — AB (ref 2.0–3.0)

## 2021-05-06 NOTE — Progress Notes (Addendum)
Increase dose today to take 5 mg and then change weekly dose to take 1 tablet daily except take 1/2 tablet on Mondays and Thursdays. Recheck in 3 weeks.  ?

## 2021-05-06 NOTE — Patient Instructions (Addendum)
Pre visit review using our clinic review tool, if applicable. No additional management support is needed unless otherwise documented below in the visit note. ? ?Increase dose today to take 5 mg and then change weekly dose to take 1 tablet daily except take 1/2 tablet on Mondays and Thursdays. Recheck in 3 weeks.  ?

## 2021-05-17 DIAGNOSIS — D485 Neoplasm of uncertain behavior of skin: Secondary | ICD-10-CM | POA: Diagnosis not present

## 2021-05-17 DIAGNOSIS — D1801 Hemangioma of skin and subcutaneous tissue: Secondary | ICD-10-CM | POA: Diagnosis not present

## 2021-05-17 DIAGNOSIS — Z85820 Personal history of malignant melanoma of skin: Secondary | ICD-10-CM | POA: Diagnosis not present

## 2021-05-17 DIAGNOSIS — L570 Actinic keratosis: Secondary | ICD-10-CM | POA: Diagnosis not present

## 2021-05-17 DIAGNOSIS — Z85828 Personal history of other malignant neoplasm of skin: Secondary | ICD-10-CM | POA: Diagnosis not present

## 2021-05-17 DIAGNOSIS — L821 Other seborrheic keratosis: Secondary | ICD-10-CM | POA: Diagnosis not present

## 2021-05-27 ENCOUNTER — Ambulatory Visit (INDEPENDENT_AMBULATORY_CARE_PROVIDER_SITE_OTHER): Payer: Medicare Other

## 2021-05-27 DIAGNOSIS — Z7901 Long term (current) use of anticoagulants: Secondary | ICD-10-CM | POA: Diagnosis not present

## 2021-05-27 LAB — POCT INR: INR: 3.2 — AB (ref 2.0–3.0)

## 2021-05-27 NOTE — Progress Notes (Addendum)
Pt reports he already took his dose today.Hold dose tomorrow and then continue 1 tablet daily except take 1/2 tablet on Mondays and Thursdays. Recheck in 3 weeks.  ? ?Medical screening examination/treatment/procedure(s) were performed by non-physician practitioner and as supervising physician I was immediately available for consultation/collaboration.  I agree with above. Lew Dawes, MD ? ?

## 2021-05-27 NOTE — Patient Instructions (Addendum)
Pre visit review using our clinic review tool, if applicable. No additional management support is needed unless otherwise documented below in the visit note. ? ?Hold dose tomorrow and then continue 1 tablet daily except take 1/2 tablet on Mondays and Thursdays. Recheck in 3 weeks.  ?

## 2021-06-02 DIAGNOSIS — L570 Actinic keratosis: Secondary | ICD-10-CM | POA: Diagnosis not present

## 2021-06-02 DIAGNOSIS — C4339 Malignant melanoma of other parts of face: Secondary | ICD-10-CM | POA: Diagnosis not present

## 2021-06-02 DIAGNOSIS — Z85828 Personal history of other malignant neoplasm of skin: Secondary | ICD-10-CM | POA: Diagnosis not present

## 2021-06-02 DIAGNOSIS — Z85820 Personal history of malignant melanoma of skin: Secondary | ICD-10-CM | POA: Diagnosis not present

## 2021-06-02 DIAGNOSIS — D485 Neoplasm of uncertain behavior of skin: Secondary | ICD-10-CM | POA: Diagnosis not present

## 2021-06-02 DIAGNOSIS — C44619 Basal cell carcinoma of skin of left upper limb, including shoulder: Secondary | ICD-10-CM | POA: Diagnosis not present

## 2021-06-13 DIAGNOSIS — R338 Other retention of urine: Secondary | ICD-10-CM | POA: Diagnosis not present

## 2021-06-13 DIAGNOSIS — N401 Enlarged prostate with lower urinary tract symptoms: Secondary | ICD-10-CM | POA: Diagnosis not present

## 2021-06-15 DIAGNOSIS — C799 Secondary malignant neoplasm of unspecified site: Secondary | ICD-10-CM | POA: Diagnosis not present

## 2021-06-15 DIAGNOSIS — E890 Postprocedural hypothyroidism: Secondary | ICD-10-CM | POA: Diagnosis not present

## 2021-06-15 DIAGNOSIS — C73 Malignant neoplasm of thyroid gland: Secondary | ICD-10-CM | POA: Diagnosis not present

## 2021-06-17 ENCOUNTER — Ambulatory Visit: Payer: Medicare Other

## 2021-06-20 ENCOUNTER — Telehealth: Payer: Self-pay

## 2021-06-20 NOTE — Telephone Encounter (Addendum)
Pt NS apt with coumadin clinic on 5/5. LVM on 5/5 for pt to call to RS. ? ?LVM today for pt to call to RS. Have openings on coumadin clinic schedule for tomorrow at [facility] for either 8 am or 1:30 pm. ?

## 2021-06-22 DIAGNOSIS — C4339 Malignant melanoma of other parts of face: Secondary | ICD-10-CM | POA: Diagnosis not present

## 2021-06-22 DIAGNOSIS — L988 Other specified disorders of the skin and subcutaneous tissue: Secondary | ICD-10-CM | POA: Diagnosis not present

## 2021-06-22 DIAGNOSIS — Z85820 Personal history of malignant melanoma of skin: Secondary | ICD-10-CM | POA: Diagnosis not present

## 2021-06-22 DIAGNOSIS — C799 Secondary malignant neoplasm of unspecified site: Secondary | ICD-10-CM | POA: Diagnosis not present

## 2021-06-22 DIAGNOSIS — C73 Malignant neoplasm of thyroid gland: Secondary | ICD-10-CM | POA: Diagnosis not present

## 2021-06-22 DIAGNOSIS — Z85828 Personal history of other malignant neoplasm of skin: Secondary | ICD-10-CM | POA: Diagnosis not present

## 2021-06-22 DIAGNOSIS — E890 Postprocedural hypothyroidism: Secondary | ICD-10-CM | POA: Diagnosis not present

## 2021-06-22 NOTE — Telephone Encounter (Signed)
Pt has been RS for 5/16 ?

## 2021-06-28 ENCOUNTER — Ambulatory Visit (INDEPENDENT_AMBULATORY_CARE_PROVIDER_SITE_OTHER): Payer: Medicare Other

## 2021-06-28 DIAGNOSIS — Z7901 Long term (current) use of anticoagulants: Secondary | ICD-10-CM

## 2021-06-28 LAB — POCT INR: INR: 2 (ref 2.0–3.0)

## 2021-06-28 NOTE — Patient Instructions (Addendum)
Pre visit review using our clinic review tool, if applicable. No additional management support is needed unless otherwise documented below in the visit note. ? ?Continue 1 tablet daily except take 1/2 tablet on Mondays and Thursdays. Recheck in 4 weeks.  ?

## 2021-06-28 NOTE — Progress Notes (Signed)
Continue 1 tablet daily except take 1/2 tablet on Mondays and Thursdays. Recheck in 4 weeks.  ?

## 2021-07-04 NOTE — Progress Notes (Signed)
Histology and Location of Primary Skin Cancer: ***  Past/Anticipated interventions by patient's surgeon/dermatologist for current problematic lesion, if any:  06/02/2021 --Dr. Shela Nevin     Past skin cancers, if any:   History of Blistering sunburns, if any: ***  SAFETY ISSUES: Prior radiation? *** I-131 therapy on 08/25/2019 158.7 mCi of iodine (Dr. Francetta Found at [facility]) Pacemaker/ICD? *** Possible current pregnancy? N/A Is the patient on methotrexate? ***  Current Complaints / other details:  ***

## 2021-07-04 NOTE — Progress Notes (Signed)
Radiation Oncology         [phone] ________________________________  Initial Outpatient Consultation  Name: Clarence Hopkins MRN: [id number]  Date: 07/05/2021  DOB: [birth year]  YI:[id number], Evie Lacks, MD  [name], MD   REFERRING PHYSICIAN: [name], MD  DIAGNOSIS: No diagnosis found.  BCC of the right lower eyelid, s/p Mohs in 2009 at [facility]  (History of metastatic thyroid cancer followed by Dr. Marin Olp)   Cancer Staging  No matching staging information was found for the patient.  CHIEF COMPLAINT: Here to discuss management of skin cancer  HISTORY OF PRESENT ILLNESS::Clarence Hopkins is a 88 y.o. male with a significant history of sun exposure and daily alcohol consumption who presents today for consideration of radiation therapy in management of a problematic site of BCC to the right lower eyelid. The patient has an extensive skin cancer history detailed below.  The patient recently followed up with his dermatologist Dr. Ronnald Ramp on 06/02/21. During this visit, the patient reported enlargement of a site of Abanda involving most of the right lower eyelid, causing subsequent consistent irritation. This site was treated in 2009 with Mohs (01/29/2008) and oculoplastics at [facility]. He later had a recurrence at this same site in 2018 and was evaluated by [facility] regarding further surgery. He however declined pursuing surgery due to other issues at the time including melanoma and metastatic thyroid cancer. In the coming years, he continued to decline any intervention to this site given that he was asymptomatic. Given his new issues above, the patient was (and is) agreeable to discuss radiation to treat this lesion. The patient also had biopsies collected from lesions on his left arm and right cheek during this visit though results are not available at this time.   Of note: The patient has a history of thyroid cancer, s/p partial left thyroidectomy in 1974. In 2017 he had nodal biopsies which  revealed metastatic papillary thyroid cancer, and he was later found to have pulmonary and liver metastases. The patient established care with Dr. Marin Olp in 2018. His disease was responsive to radioactive iodine (dose: 158.7 mCi of I-131), which he received on 08/27/2019 with Dr. Hartford Poli at [facility].       PREVIOUS RADIATION THERAPY: {EXAM; YES/NO:19492::"No"}  PAST MEDICAL HISTORY:  has a past medical history of Arthritis, Cancer ([place]) (2003), GERD (gastroesophageal reflux disease), Hypothyroidism, Lung mass, and Pulmonary embolism ([place]) (2005 & 2007).  Metastatic thyroid cancer  PAST SURGICAL HISTORY: Past Surgical History:  Procedure Laterality Date   BRONCHIAL BRUSHINGS  07/15/2019   Procedure: BRONCHIAL BRUSHINGS;  Surgeon: Rigoberto Noel, MD;  Location: [facility] ENDOSCOPY;  Service: Cardiopulmonary;;   BRONCHIAL WASHINGS  07/15/2019   Procedure: BRONCHIAL WASHINGS;  Surgeon: Rigoberto Noel, MD;  Location: [place] ENDOSCOPY;  Service: Cardiopulmonary;;   CARDIAC CATHETERIZATION     > 20 years ago " everything was okay"    CERVICAL FUSION      X 2; Dr Vertell Limber   COLONOSCOPY     ENDOBRONCHIAL ULTRASOUND N/A 07/15/2019   Procedure: ENDOBRONCHIAL ULTRASOUND;  Surgeon: Rigoberto Noel, MD;  Location: [place];  Service: Cardiopulmonary;  Laterality: N/A;   FINE NEEDLE ASPIRATION  07/15/2019   Procedure: FINE NEEDLE ASPIRATION (FNA) LINEAR;  Surgeon: Rigoberto Noel, MD;  Location: [place];  Service: Cardiopulmonary;;   FINGER ARTHROPLASTY Right 04/17/2013   Procedure: IMPLANT ARTHROPLASTY RIGHT INDEX;  Surgeon: Cammie Sickle., MD;  Location: [place];  Service: Orthopedics;  Laterality:  Right;   LUMBAR LAMINECTOMY  2010   Dr Becky Sax   MELANOMA EXCISION  2003   L ear   THYROIDECTOMY  1971   cytology indefinite   TONSILLECTOMY     TOTAL KNEE ARTHROPLASTY  2004   left   VIDEO BRONCHOSCOPY N/A 07/15/2019   Procedure: VIDEO BRONCHOSCOPY WITHOUT FLUORO;  Surgeon: Rigoberto Noel, MD;  Location: [place];  Service: Cardiopulmonary;  Laterality: N/A;   WISDOM TOOTH EXTRACTION      FAMILY HISTORY: family history includes Alzheimer's disease in his father; Breast cancer in his maternal grandmother, mother, and sister; Cancer in his sister.  SOCIAL HISTORY:  reports that he quit smoking about 28 years ago. His smoking use included cigarettes. He has a 30.75 pack-year smoking history. He has never used smokeless tobacco. He reports current alcohol use. He reports that he does not use drugs.  ALLERGIES: Patient has no known allergies.  MEDICATIONS:  Current Outpatient Medications  Medication Sig Dispense Refill   Cholecalciferol (VITAMIN D3) 50 MCG (2000 UT) capsule Take 1 capsule (2,000 Units total) by mouth daily. 100 capsule 3   finasteride (PROSCAR) 5 MG tablet Take 1 tablet (5 mg total) by mouth daily. 90 tablet 3   levothyroxine (SYNTHROID) 175 MCG tablet Take 1 tablet (175 mcg total) by mouth daily before breakfast. 90 tablet 3   mupirocin ointment (BACTROBAN) 2 % Apply 1 application topically daily.     polyvinyl alcohol (LIQUIFILM TEARS) 1.4 % ophthalmic solution Place 1 drop into both eyes as needed for dry eyes.     sennosides-docusate sodium (SENOKOT-S) 8.6-50 MG tablet Take 1 tablet by mouth daily. 100 tablet 3   tamsulosin (FLOMAX) 0.4 MG CAPS capsule 1 po qhs 90 capsule 3   traMADol (ULTRAM) 50 MG tablet Take 1 tablet (50 mg total) by mouth 2 (two) times daily as needed for severe pain or moderate pain. (Patient not taking: Reported on 04/11/2021) 60 tablet 2   warfarin (COUMADIN) 5 MG tablet Take 5 mg on  Tue, Thur, Sat, Sun. Take 2.5 mg on Mon, Wed, Fri 100 tablet 3   No current facility-administered medications for this encounter.    REVIEW OF SYSTEMS:  Notable for that above.   PHYSICAL EXAM:  vitals were not taken for this visit.   General: Alert and oriented, in no acute distress *** HEENT: Head is normocephalic. Extraocular movements  are intact. Oropharynx is clear. Neck: Neck is supple, no palpable cervical or supraclavicular lymphadenopathy. Heart: Regular in rate and rhythm with no murmurs, rubs, or gallops. Chest: Clear to auscultation bilaterally, with no rhonchi, wheezes, or rales. Abdomen: Soft, nontender, nondistended, with no rigidity or guarding. Extremities: No cyanosis or edema. Lymphatics: see Neck Exam Skin: No concerning lesions. Musculoskeletal: symmetric strength and muscle tone throughout. Neurologic: Cranial nerves II through XII are grossly intact. No obvious focalities. Speech is fluent. Coordination is intact. Psychiatric: Judgment and insight are intact. Affect is appropriate.   ECOG = ***  0 - Asymptomatic (Fully active, able to carry on all predisease activities without restriction)  1 - Symptomatic but completely ambulatory (Restricted in physically strenuous activity but ambulatory and able to carry out work of a light or sedentary nature. For example, light housework, office work)  2 - Symptomatic, <50% in bed during the day (Ambulatory and capable of all self care but unable to carry out any work activities. Up and about more than 50% of waking hours)  3 - Symptomatic, >50% in bed, but  not bedbound (Capable of only limited self-care, confined to bed or chair 50% or more of waking hours)  4 - Bedbound (Completely disabled. Cannot carry on any self-care. Totally confined to bed or chair)  5 - Death   Eustace Pen MM, Creech RH, Tormey DC, et al. [phone]). "Toxicity and response criteria of the [facility]". [place] Oncol. 5 (6): 649-55   LABORATORY DATA:  Lab Results  Component Value Date   WBC 4.4 04/11/2021   HGB 12.9 (L) 04/11/2021   HCT 41.5 04/11/2021   MCV 87.7 04/11/2021   PLT 202 04/11/2021   CMP     Component Value Date/Time   NA 137 04/11/2021 0948   NA 142 11/29/2020 0000   K 4.6 04/11/2021 0948   CL 101 04/11/2021 0948   CO2 32 04/11/2021 0948    GLUCOSE 92 04/11/2021 0948   BUN 17 04/11/2021 0948   BUN 19 11/29/2020 0000   CREATININE 1.12 04/11/2021 0948   CALCIUM 9.1 04/11/2021 0948   PROT 6.6 04/11/2021 0948   ALBUMIN 3.6 04/11/2021 0948   AST 18 04/11/2021 0948   ALT 13 04/11/2021 0948   ALKPHOS 85 04/11/2021 0948   BILITOT 0.7 04/11/2021 0948   GFRNONAA >60 04/11/2021 0948   GFRAA >60 10/21/2019 1306         RADIOGRAPHY: No results found.    IMPRESSION/PLAN:***    On date of service, in total, I spent *** minutes on this encounter. Patient was seen in person.   __________________________________________   Eppie Gibson, MD  This document serves as a record of services personally performed by Eppie Gibson, MD. It was created on her behalf by Roney Mans, a trained medical scribe. The creation of this record is based on the scribe's personal observations and the provider's statements to them. This document has been checked and approved by the attending provider.

## 2021-07-05 ENCOUNTER — Encounter: Payer: Self-pay | Admitting: Radiation Oncology

## 2021-07-05 ENCOUNTER — Ambulatory Visit
Admission: RE | Admit: 2021-07-05 | Discharge: 2021-07-05 | Disposition: A | Discharge status: Home / Self Care | Payer: Medicare Other | Source: Ambulatory Visit | Attending: Radiation Oncology | Admitting: Radiation Oncology

## 2021-07-05 ENCOUNTER — Other Ambulatory Visit: Payer: Self-pay

## 2021-07-05 VITALS — BP 153/84 | HR 70 | Temp 96.0°F | Resp 18 | Ht 75.0 in | Wt 211.1 lb

## 2021-07-05 DIAGNOSIS — Z87891 Personal history of nicotine dependence: Secondary | ICD-10-CM | POA: Insufficient documentation

## 2021-07-05 DIAGNOSIS — Z85850 Personal history of malignant neoplasm of thyroid: Secondary | ICD-10-CM | POA: Insufficient documentation

## 2021-07-05 DIAGNOSIS — Z86711 Personal history of pulmonary embolism: Secondary | ICD-10-CM | POA: Diagnosis not present

## 2021-07-05 DIAGNOSIS — K219 Gastro-esophageal reflux disease without esophagitis: Secondary | ICD-10-CM | POA: Diagnosis not present

## 2021-07-05 DIAGNOSIS — C441122 Basal cell carcinoma of skin of right lower eyelid, including canthus: Secondary | ICD-10-CM | POA: Insufficient documentation

## 2021-07-05 DIAGNOSIS — R918 Other nonspecific abnormal finding of lung field: Secondary | ICD-10-CM | POA: Diagnosis not present

## 2021-07-05 DIAGNOSIS — M129 Arthropathy, unspecified: Secondary | ICD-10-CM | POA: Diagnosis not present

## 2021-07-05 DIAGNOSIS — Z803 Family history of malignant neoplasm of breast: Secondary | ICD-10-CM | POA: Insufficient documentation

## 2021-07-05 DIAGNOSIS — Z79899 Other long term (current) drug therapy: Secondary | ICD-10-CM | POA: Diagnosis not present

## 2021-07-05 DIAGNOSIS — E039 Hypothyroidism, unspecified: Secondary | ICD-10-CM | POA: Insufficient documentation

## 2021-07-06 ENCOUNTER — Other Ambulatory Visit: Payer: Self-pay

## 2021-07-06 DIAGNOSIS — C441122 Basal cell carcinoma of skin of right lower eyelid, including canthus: Secondary | ICD-10-CM

## 2021-07-06 NOTE — Progress Notes (Signed)
Oncology Nurse Navigator Documentation   Mr. Smolinsky and his wife met with Dr. Isidore Moos 5/23 to discuss possible radiation to his right lower eyelid due to [facility]. After consultation Dr. Isidore Moos spoke with Dr. Sabino Dick at [facility] aesthetics to discuss possible surgical options as well. Dr. Sabino Dick is willing to see the patient to discuss surgical options. He has requested a MRI of his orbits be completed which has been scheduled for 07/09/21. I have contacted his office to get Mr. Cropper an appointment after the MRI. I have contacted Mr. Mizell and his wife to make them aware of the plan going forward. They are agreeable and were given my direct contact information to call if they had any questions or concerns going forward.   Harlow Asa RN, BSN, OCN Head & Neck Oncology Nurse [place] at [facility] Phone # [phone]  Fax # [phone]

## 2021-07-09 ENCOUNTER — Ambulatory Visit (HOSPITAL_COMMUNITY)
Admission: RE | Admit: 2021-07-09 | Discharge: 2021-07-09 | Disposition: A | Discharge status: Home / Self Care | Payer: Medicare Other | Source: Ambulatory Visit | Attending: Radiation Oncology | Admitting: Radiation Oncology

## 2021-07-09 DIAGNOSIS — J329 Chronic sinusitis, unspecified: Secondary | ICD-10-CM | POA: Diagnosis not present

## 2021-07-09 DIAGNOSIS — C441122 Basal cell carcinoma of skin of right lower eyelid, including canthus: Secondary | ICD-10-CM | POA: Diagnosis not present

## 2021-07-09 DIAGNOSIS — J3489 Other specified disorders of nose and nasal sinuses: Secondary | ICD-10-CM | POA: Diagnosis not present

## 2021-07-09 DIAGNOSIS — R220 Localized swelling, mass and lump, head: Secondary | ICD-10-CM | POA: Diagnosis not present

## 2021-07-09 DIAGNOSIS — C44111 Basal cell carcinoma of skin of unspecified eyelid, including canthus: Secondary | ICD-10-CM | POA: Diagnosis not present

## 2021-07-09 MED ORDER — GADOBUTROL 1 MMOL/ML IV SOLN
10.0000 mL | Freq: Once | INTRAVENOUS | Status: AC | PRN
  Administered 2021-07-09: 10 mL via INTRAVENOUS

## 2021-07-13 ENCOUNTER — Other Ambulatory Visit: Payer: Self-pay

## 2021-07-13 ENCOUNTER — Encounter: Payer: Self-pay | Admitting: Hematology & Oncology

## 2021-07-13 ENCOUNTER — Encounter (HOSPITAL_BASED_OUTPATIENT_CLINIC_OR_DEPARTMENT_OTHER): Payer: Self-pay

## 2021-07-13 ENCOUNTER — Inpatient Hospital Stay: Payer: Medicare Other | Attending: Hematology & Oncology

## 2021-07-13 ENCOUNTER — Ambulatory Visit (HOSPITAL_BASED_OUTPATIENT_CLINIC_OR_DEPARTMENT_OTHER)
Admission: RE | Admit: 2021-07-13 | Discharge: 2021-07-13 | Disposition: A | Discharge status: Home / Self Care | Payer: Medicare Other | Source: Ambulatory Visit | Attending: Hematology & Oncology | Admitting: Hematology & Oncology

## 2021-07-13 ENCOUNTER — Inpatient Hospital Stay (HOSPITAL_BASED_OUTPATIENT_CLINIC_OR_DEPARTMENT_OTHER): Payer: Medicare Other | Admitting: Hematology & Oncology

## 2021-07-13 VITALS — BP 152/82 | HR 74 | Temp 97.7°F | Resp 19 | Ht 75.2 in | Wt 203.0 lb

## 2021-07-13 DIAGNOSIS — I700 Atherosclerosis of aorta: Secondary | ICD-10-CM | POA: Diagnosis not present

## 2021-07-13 DIAGNOSIS — C73 Malignant neoplasm of thyroid gland: Secondary | ICD-10-CM | POA: Insufficient documentation

## 2021-07-13 DIAGNOSIS — R918 Other nonspecific abnormal finding of lung field: Secondary | ICD-10-CM | POA: Diagnosis not present

## 2021-07-13 LAB — CBC WITH DIFFERENTIAL (CANCER CENTER ONLY)
Abs Immature Granulocytes: 0.02 10*3/uL (ref 0.00–0.07)
Basophils Absolute: 0 10*3/uL (ref 0.0–0.1)
Basophils Relative: 0 %
Eosinophils Absolute: 0.1 10*3/uL (ref 0.0–0.5)
Eosinophils Relative: 3 %
HCT: 41 % (ref 39.0–52.0)
Hemoglobin: 12.8 g/dL — ABNORMAL LOW (ref 13.0–17.0)
Immature Granulocytes: 0 %
Lymphocytes Relative: 25 %
Lymphs Abs: 1.3 10*3/uL (ref 0.7–4.0)
MCH: 27.4 pg (ref 26.0–34.0)
MCHC: 31.2 g/dL (ref 30.0–36.0)
MCV: 87.8 fL (ref 80.0–100.0)
Monocytes Absolute: 0.6 10*3/uL (ref 0.1–1.0)
Monocytes Relative: 11 %
Neutro Abs: 3.2 10*3/uL (ref 1.7–7.7)
Neutrophils Relative %: 61 %
Platelet Count: 184 10*3/uL (ref 150–400)
RBC: 4.67 MIL/uL (ref 4.22–5.81)
RDW: 13.1 % (ref 11.5–15.5)
WBC Count: 5.3 10*3/uL (ref 4.0–10.5)
nRBC: 0 % (ref 0.0–0.2)

## 2021-07-13 LAB — CMP (CANCER CENTER ONLY)
ALT: 18 U/L (ref 0–44)
AST: 20 U/L (ref 15–41)
Albumin: 3.9 g/dL (ref 3.5–5.0)
Alkaline Phosphatase: 77 U/L (ref 38–126)
Anion gap: 3 — ABNORMAL LOW (ref 5–15)
BUN: 19 mg/dL (ref 8–23)
CO2: 31 mmol/L (ref 22–32)
Calcium: 9.5 mg/dL (ref 8.9–10.3)
Chloride: 104 mmol/L (ref 98–111)
Creatinine: 1.07 mg/dL (ref 0.61–1.24)
GFR, Estimated: >60 mL/min (ref >60)
Glucose, Bld: 94 mg/dL (ref 70–99)
Potassium: 4.7 mmol/L (ref 3.5–5.1)
Sodium: 138 mmol/L (ref 135–145)
Total Bilirubin: 0.8 mg/dL (ref 0.3–1.2)
Total Protein: 6.9 g/dL (ref 6.5–8.1)

## 2021-07-13 LAB — LACTATE DEHYDROGENASE: LDH: 119 U/L (ref 98–192)

## 2021-07-13 MED ORDER — IOHEXOL 300 MG/ML  SOLN
100.0000 mL | Freq: Once | INTRAMUSCULAR | Status: AC | PRN
Start: 2021-07-13 — End: 2021-07-13
  Administered 2021-07-13: 75 mL via INTRAVENOUS

## 2021-07-13 NOTE — Progress Notes (Signed)
Hematology and Oncology Follow Up Visit  Clarence Hopkins [id number] Apr 02, 1930 88 y.o. 07/13/2021   Principle Diagnosis:  Metastatic papillary carcinoma of the thyroid  Current Therapy:   S/p I-131 therapy on 08/27/2019     Interim History:  [name] is back for follow-up.  Looks like the problem that he now has is that there is recurrence of a basal cell carcinoma under the right eye.  He has seen Radiation oncology.  ID is going to see a oculoplastic surgeon next week.  He comes in with his wife.  A daughter is on the cell phone.  They want to know mine recommendations.  I told the I thought that he was going to receive radiation therapy regardless.  I think where this tumor is located, it be very difficult to get it totally excised.  He really is not all that interested in having surgery.  Again, I think that radiation therapy would be effective for basal cell.  It does not look like this is a locally advanced basal cell.  Is just in a very tough spot on the lower eyelid.  As far as his thyroid cancer goes, this is doing quite nicely.  He had a CT scan done today.  The CT scan did not show any evidence of growth of his nodules or any new nodules.  He still has weakness.  I think this is probably his age.  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  I am unsure why he is not on Eliquis or Xarelto.  I do not see a reason why he cannot be on these medications.  I know his family doctor is monitoring his Coumadin.  He has no fever.  He has had no cough.  His appetite seems to be doing pretty well.  He has had no problems with bleeding.  There is no change in bowel or bladder habits.  Overall, I would have to say that his performance status is probably ECOG 2.     Medications:  Current Outpatient Medications:    Cholecalciferol (VITAMIN D3) 50 MCG (2000 UT) capsule, Take 1 capsule (2,000 Units total) by mouth daily., Disp: 100 capsule, Rfl: 3   finasteride (PROSCAR) 5 MG tablet, Take 1  tablet (5 mg total) by mouth daily., Disp: 90 tablet, Rfl: 3   levothyroxine (SYNTHROID) 200 MCG tablet, Take 200 mcg by mouth daily., Disp: , Rfl:    levothyroxine (SYNTHROID) 25 MCG tablet, Take 25 mcg by mouth daily. Take with 200 mcg tablet to total 225 mcg dose by Dr. Hartford Poli, Disp: , Rfl:    mupirocin ointment (BACTROBAN) 2 %, Apply 1 application topically daily., Disp: , Rfl:    polyvinyl alcohol (LIQUIFILM TEARS) 1.4 % ophthalmic solution, Place 1 drop into both eyes as needed for dry eyes., Disp: , Rfl:    sennosides-docusate sodium (SENOKOT-S) 8.6-50 MG tablet, Take 1 tablet by mouth daily., Disp: 100 tablet, Rfl: 3   tamsulosin (FLOMAX) 0.4 MG CAPS capsule, 1 po qhs, Disp: 90 capsule, Rfl: 3   traMADol (ULTRAM) 50 MG tablet, Take 1 tablet (50 mg total) by mouth 2 (two) times daily as needed for severe pain or moderate pain., Disp: 60 tablet, Rfl: 2   warfarin (COUMADIN) 5 MG tablet, Take 5 mg on  Tue, Thur, Sat, Sun. Take 2.5 mg on Mon, Wed, Fri, Disp: 100 tablet, Rfl: 3  Allergies: No Known Allergies  Past Medical History, Surgical history, Social history, and Family History were reviewed and  updated.  Review of Systems: Review of Systems  Constitutional: Negative.   HENT:  Negative.    Eyes: Negative.   Respiratory: Negative.    Cardiovascular: Negative.   Gastrointestinal: Negative.   Endocrine: Negative.   Genitourinary: Negative.    Musculoskeletal: Negative.   Skin: Negative.   Neurological: Negative.   Hematological: Negative.   Psychiatric/Behavioral: Negative.     Physical Exam:  height is 6' 3.2" (1.91 m) and weight is 203 lb (92.1 kg). His oral temperature is 97.7 F (36.5 C). His blood pressure is 152/82 (abnormal) and his pulse is 74. His respiration is 19 and oxygen saturation is 99%.   Wt Readings from Last 3 Encounters:  07/13/21 203 lb (92.1 kg)  07/05/21 211 lb 2 oz (95.8 kg)  04/11/21 200 lb (90.7 kg)    Physical Exam Vitals reviewed.  HENT:      Head: Normocephalic and atraumatic.  Eyes:     Pupils: Pupils are equal, round, and reactive to light.  Cardiovascular:     Heart sounds: Normal heart sounds.     Comments: Cardiac exam is slow but regular.  He does have occasional extra beats.  He does have an occasional skipped beat.  He has 1/6 systolic ejection murmur. Pulmonary:     Effort: Pulmonary effort is normal.     Breath sounds: Normal breath sounds.  Abdominal:     General: Bowel sounds are normal.     Palpations: Abdomen is soft.  Musculoskeletal:        General: No tenderness or deformity. Normal range of motion.     Cervical back: Normal range of motion.  Lymphadenopathy:     Cervical: No cervical adenopathy.  Skin:    General: Skin is warm and dry.     Findings: No erythema or rash.  Neurological:     Mental Status: He is alert and oriented to person, place, and time.  Psychiatric:        Behavior: Behavior normal.        Thought Content: Thought content normal.        Judgment: Judgment normal.     Lab Results  Component Value Date   WBC 5.3 07/13/2021   HGB 12.8 (L) 07/13/2021   HCT 41.0 07/13/2021   MCV 87.8 07/13/2021   PLT 184 07/13/2021     Chemistry      Component Value Date/Time   NA 138 07/13/2021 0957   NA 142 11/29/2020 0000   K 4.7 07/13/2021 0957   CL 104 07/13/2021 0957   CO2 31 07/13/2021 0957   BUN 19 07/13/2021 0957   BUN 19 11/29/2020 0000   CREATININE 1.07 07/13/2021 0957   GLU 95 11/29/2020 0000      Component Value Date/Time   CALCIUM 9.5 07/13/2021 0957   ALKPHOS 77 07/13/2021 0957   AST 20 07/13/2021 0957   ALT 18 07/13/2021 0957   BILITOT 0.8 07/13/2021 0957      Impression and Plan: [name] is a very nice 88 year old white male.  He has metastatic thyroid cancer.  This is a differentiated thyroid cancer.  Thankfully it is responsive to the radioactive iodine.  He is still responding.  He sees Dr. Hartford Poli for treatment of the thyroid cancer.  The problem right now  is this basal cell carcinoma.  It is under the right eye.  It is right on the lower lid.  Again I do not think that this could be totally resected.  As  such, I would think that radiation therapy would really be a reasonable way to go.  I know that we have systemic therapies for basal cell carcinoma.  However, I do not see that these are indicated in this situation.  For right now, I would really would like to see him back in about 6 weeks or so.  I want to follow-up a little more closely given the change in his overall status.   [name], MD 5/31/20235:15 PM

## 2021-07-19 LAB — THYROGLOBULIN LEVEL: Thyroglobulin: 13 ng/mL

## 2021-07-20 ENCOUNTER — Telehealth: Payer: Self-pay

## 2021-07-20 DIAGNOSIS — M1990 Unspecified osteoarthritis, unspecified site: Secondary | ICD-10-CM | POA: Insufficient documentation

## 2021-07-20 DIAGNOSIS — E039 Hypothyroidism, unspecified: Secondary | ICD-10-CM | POA: Insufficient documentation

## 2021-07-20 DIAGNOSIS — K219 Gastro-esophageal reflux disease without esophagitis: Secondary | ICD-10-CM | POA: Insufficient documentation

## 2021-07-20 NOTE — Telephone Encounter (Signed)
-----   Message from [name], MD sent at 07/19/2021  9:21 PM EDT ----- Please send this result to his endocrinologist -- Dr Meyer Russel.  Thanks!!!   [name]

## 2021-07-20 NOTE — Telephone Encounter (Signed)
Results faxed to Dr Hartford Poli.

## 2021-07-20 NOTE — Telephone Encounter (Signed)
Received call from [place] at Dr. Beacher May office ([facility]) confirming that they had received Dr. Pearlie Oyster consult note (faxed to [phone]). Denied any other needs at this time

## 2021-07-21 DIAGNOSIS — H16211 Exposure keratoconjunctivitis, right eye: Secondary | ICD-10-CM | POA: Diagnosis not present

## 2021-07-21 DIAGNOSIS — D485 Neoplasm of uncertain behavior of skin: Secondary | ICD-10-CM | POA: Diagnosis not present

## 2021-07-21 DIAGNOSIS — Z961 Presence of intraocular lens: Secondary | ICD-10-CM | POA: Diagnosis not present

## 2021-07-29 ENCOUNTER — Ambulatory Visit: Payer: Medicare Other

## 2021-08-02 ENCOUNTER — Ambulatory Visit (INDEPENDENT_AMBULATORY_CARE_PROVIDER_SITE_OTHER): Payer: Medicare Other

## 2021-08-02 DIAGNOSIS — Z7901 Long term (current) use of anticoagulants: Secondary | ICD-10-CM

## 2021-08-02 LAB — POCT INR: INR: 2.9 (ref 2.0–3.0)

## 2021-08-02 NOTE — Progress Notes (Addendum)
Continue 1 tablet daily except take 1/2 tablet on Mondays and Thursdays. Recheck in 4 weeks.   Medical screening examination/treatment/procedure(s) were performed by non-physician practitioner and as supervising physician I was immediately available for consultation/collaboration.  I agree with above. Lew Dawes, MD

## 2021-08-02 NOTE — Patient Instructions (Addendum)
Pre visit review using our clinic review tool, if applicable. No additional management support is needed unless otherwise documented below in the visit note.  Continue 1 tablet daily except take 1/2 tablet on Mondays and Thursdays. Recheck in 4 weeks.

## 2021-08-24 DIAGNOSIS — E890 Postprocedural hypothyroidism: Secondary | ICD-10-CM | POA: Diagnosis not present

## 2021-08-25 DIAGNOSIS — H02532 Eyelid retraction right lower eyelid: Secondary | ICD-10-CM | POA: Diagnosis not present

## 2021-08-25 DIAGNOSIS — D485 Neoplasm of uncertain behavior of skin: Secondary | ICD-10-CM | POA: Diagnosis not present

## 2021-08-25 DIAGNOSIS — H16211 Exposure keratoconjunctivitis, right eye: Secondary | ICD-10-CM | POA: Diagnosis not present

## 2021-08-25 DIAGNOSIS — Z961 Presence of intraocular lens: Secondary | ICD-10-CM | POA: Diagnosis not present

## 2021-08-30 ENCOUNTER — Inpatient Hospital Stay (HOSPITAL_BASED_OUTPATIENT_CLINIC_OR_DEPARTMENT_OTHER): Payer: Medicare Other | Admitting: Hematology & Oncology

## 2021-08-30 ENCOUNTER — Inpatient Hospital Stay: Payer: Medicare Other | Attending: Hematology & Oncology

## 2021-08-30 ENCOUNTER — Ambulatory Visit (INDEPENDENT_AMBULATORY_CARE_PROVIDER_SITE_OTHER): Payer: Medicare Other

## 2021-08-30 ENCOUNTER — Other Ambulatory Visit: Payer: Self-pay

## 2021-08-30 ENCOUNTER — Encounter: Payer: Self-pay | Admitting: Hematology & Oncology

## 2021-08-30 VITALS — BP 111/69 | HR 71 | Temp 97.4°F | Resp 19 | Wt 201.0 lb

## 2021-08-30 DIAGNOSIS — C44319 Basal cell carcinoma of skin of other parts of face: Secondary | ICD-10-CM | POA: Diagnosis not present

## 2021-08-30 DIAGNOSIS — Z79890 Hormone replacement therapy (postmenopausal): Secondary | ICD-10-CM | POA: Diagnosis not present

## 2021-08-30 DIAGNOSIS — C73 Malignant neoplasm of thyroid gland: Secondary | ICD-10-CM

## 2021-08-30 DIAGNOSIS — Z7901 Long term (current) use of anticoagulants: Secondary | ICD-10-CM

## 2021-08-30 DIAGNOSIS — Z85828 Personal history of other malignant neoplasm of skin: Secondary | ICD-10-CM

## 2021-08-30 LAB — CBC WITH DIFFERENTIAL (CANCER CENTER ONLY)
Abs Immature Granulocytes: 0.01 10*3/uL (ref 0.00–0.07)
Basophils Absolute: 0 10*3/uL (ref 0.0–0.1)
Basophils Relative: 1 %
Eosinophils Absolute: 0.2 10*3/uL (ref 0.0–0.5)
Eosinophils Relative: 3 %
HCT: 39.2 % (ref 39.0–52.0)
Hemoglobin: 12.1 g/dL — ABNORMAL LOW (ref 13.0–17.0)
Immature Granulocytes: 0 %
Lymphocytes Relative: 30 %
Lymphs Abs: 1.7 10*3/uL (ref 0.7–4.0)
MCH: 27 pg (ref 26.0–34.0)
MCHC: 30.9 g/dL (ref 30.0–36.0)
MCV: 87.5 fL (ref 80.0–100.0)
Monocytes Absolute: 0.6 10*3/uL (ref 0.1–1.0)
Monocytes Relative: 11 %
Neutro Abs: 3.2 10*3/uL (ref 1.7–7.7)
Neutrophils Relative %: 55 %
Platelet Count: 199 10*3/uL (ref 150–400)
RBC: 4.48 MIL/uL (ref 4.22–5.81)
RDW: 12.6 % (ref 11.5–15.5)
WBC Count: 5.6 10*3/uL (ref 4.0–10.5)
nRBC: 0 % (ref 0.0–0.2)

## 2021-08-30 LAB — CMP (CANCER CENTER ONLY)
ALT: 19 U/L (ref 0–44)
AST: 19 U/L (ref 15–41)
Albumin: 3.8 g/dL (ref 3.5–5.0)
Alkaline Phosphatase: 96 U/L (ref 38–126)
Anion gap: 6 (ref 5–15)
BUN: 19 mg/dL (ref 8–23)
CO2: 28 mmol/L (ref 22–32)
Calcium: 9.1 mg/dL (ref 8.9–10.3)
Chloride: 104 mmol/L (ref 98–111)
Creatinine: 1.1 mg/dL (ref 0.61–1.24)
GFR, Estimated: >60 mL/min (ref >60)
Glucose, Bld: 121 mg/dL — ABNORMAL HIGH (ref 70–99)
Potassium: 4.6 mmol/L (ref 3.5–5.1)
Sodium: 138 mmol/L (ref 135–145)
Total Bilirubin: 0.5 mg/dL (ref 0.3–1.2)
Total Protein: 6.5 g/dL (ref 6.5–8.1)

## 2021-08-30 LAB — POCT INR: INR: 3.4 — AB (ref 2.0–3.0)

## 2021-08-30 NOTE — Progress Notes (Cosign Needed Addendum)
Pt already took dose today. Hold dose tomorrow and then change weekly dose to take 1 tablet daily except take 1/2 tablet on Mondays, Wednesdays, and Fridays. Recheck in 3 weeks.  Medical screening examination/treatment/procedure(s) were performed by non-physician practitioner and as supervising physician I was immediately available for consultation/collaboration.  I agree with above. Lew Dawes, MD

## 2021-08-30 NOTE — Patient Instructions (Addendum)
Pre visit review using our clinic review tool, if applicable. No additional management support is needed unless otherwise documented below in the visit note.  Hold dose tomorrow and then change weekly dose to take 1 tablet daily except take 1/2 tablet on Mondays, Wednesdays, and Fridays. Recheck in 3 weeks.

## 2021-08-30 NOTE — Progress Notes (Signed)
Hematology and Oncology Follow Up Visit  Clarence Hopkins [id number] Oct 19, 1930 88 y.o. 08/30/2021   Principle Diagnosis:  Metastatic papillary carcinoma of the thyroid Basal cell carcinoma under the right eye.  Current Therapy:   S/p I-131 therapy on 08/27/2019 Vismodegib 150 mg po q day -- start on 09/05/2021     Interim History:  [name] is back for follow-up.  We are following along now because of the basal carcinoma under the right eye.  He is seen Radiation Oncology.  They recommended radiation therapy.  He did see an ophthalmologist.  Apparently, the ophthalmologist did not feel that radiation therapy would be appropriate for [name].  I think he was worried about the possibility of ocular toxicity.  I think the ophthalmologist would like to consider some type of "neoadjuvant" treatment.  I think this would probably be reasonable and then see about resection.  I think he would recommend an oral regimen.  There is a oral agent that is a hedgehog pathway inhibitor.  This is called  Erivedge.  I think this is some that is typically used for local advanced or metastatic disease.  However, I think would be reasonable to try this.  I think the dose of the 150 mg daily.  I had this to [place] and his wife promised to call me in the name of his ophthalmologist so I can talk to him about this.  Otherwise, he seems to be doing about the same.  His wife said that he gets very tired in the afternoon.  I realize that he does have the underlying thyroid cancer.  This could certainly be a factor with him not feeling all that well in the afternoon.  He is eating okay.  He says he gets a little bit choked on food every now then.  He may have a stricture.  He does not want to see a gastroenterologist.  There is been no bleeding.  He has had no problems with the right eye.  He has had no pain.  Overall, I would say his performance status is probably ECOG 2.    Medications:  Current Outpatient  Medications:    Cholecalciferol (VITAMIN D3) 50 MCG (2000 UT) capsule, Take 1 capsule (2,000 Units total) by mouth daily., Disp: 100 capsule, Rfl: 3   finasteride (PROSCAR) 5 MG tablet, Take 1 tablet (5 mg total) by mouth daily., Disp: 90 tablet, Rfl: 3   levothyroxine (SYNTHROID) 200 MCG tablet, Take 200 mcg by mouth daily., Disp: , Rfl:    levothyroxine (SYNTHROID) 25 MCG tablet, Take 25 mcg by mouth daily. Take with 200 mcg tablet to total 225 mcg dose by Dr. Hartford Poli, Disp: , Rfl:    mupirocin ointment (BACTROBAN) 2 %, Apply 1 application topically daily., Disp: , Rfl:    polyvinyl alcohol (LIQUIFILM TEARS) 1.4 % ophthalmic solution, Place 1 drop into both eyes as needed for dry eyes., Disp: , Rfl:    sennosides-docusate sodium (SENOKOT-S) 8.6-50 MG tablet, Take 1 tablet by mouth daily., Disp: 100 tablet, Rfl: 3   tamsulosin (FLOMAX) 0.4 MG CAPS capsule, 1 po qhs, Disp: 90 capsule, Rfl: 3   traMADol (ULTRAM) 50 MG tablet, Take 1 tablet (50 mg total) by mouth 2 (two) times daily as needed for severe pain or moderate pain., Disp: 60 tablet, Rfl: 2   warfarin (COUMADIN) 5 MG tablet, Take 5 mg on  Tue, Thur, Sat, Sun. Take 2.5 mg on Mon, Wed, Fri, Disp: 100 tablet, Rfl: 3  Allergies: No Known Allergies  Past Medical History, Surgical history, Social history, and Family History were reviewed and updated.  Review of Systems: Review of Systems  Constitutional: Negative.   HENT:  Negative.    Eyes: Negative.   Respiratory: Negative.    Cardiovascular: Negative.   Gastrointestinal: Negative.   Endocrine: Negative.   Genitourinary: Negative.    Musculoskeletal: Negative.   Skin: Negative.   Neurological: Negative.   Hematological: Negative.   Psychiatric/Behavioral: Negative.      Physical Exam:  weight is 201 lb (91.2 kg). His oral temperature is 97.4 F (36.3 C) (abnormal). His blood pressure is 111/69 and his pulse is 71. His respiration is 19 and oxygen saturation is 96%.   Wt Readings  from Last 3 Encounters:  08/30/21 201 lb (91.2 kg)  07/13/21 203 lb (92.1 kg)  07/05/21 211 lb 2 oz (95.8 kg)    Physical Exam Vitals reviewed.  HENT:     Head: Normocephalic and atraumatic.     Comments: Under the right eye is a nodule.  This probably measures about 5 mm x 2 mm.  It is firm and not mobile. Eyes:     Pupils: Pupils are equal, round, and reactive to light.  Cardiovascular:     Heart sounds: Normal heart sounds.     Comments: Cardiac exam is slow but regular.  He does have occasional extra beats.  He does have an occasional skipped beat.  He has 1/6 systolic ejection murmur. Pulmonary:     Effort: Pulmonary effort is normal.     Breath sounds: Normal breath sounds.  Abdominal:     General: Bowel sounds are normal.     Palpations: Abdomen is soft.  Musculoskeletal:        General: No tenderness or deformity. Normal range of motion.     Cervical back: Normal range of motion.  Lymphadenopathy:     Cervical: No cervical adenopathy.  Skin:    General: Skin is warm and dry.     Findings: No erythema or rash.  Neurological:     Mental Status: He is alert and oriented to person, place, and time.  Psychiatric:        Behavior: Behavior normal.        Thought Content: Thought content normal.        Judgment: Judgment normal.      Lab Results  Component Value Date   WBC 5.6 08/30/2021   HGB 12.1 (L) 08/30/2021   HCT 39.2 08/30/2021   MCV 87.5 08/30/2021   PLT 199 08/30/2021     Chemistry      Component Value Date/Time   NA 138 08/30/2021 1501   NA 142 11/29/2020 0000   K 4.6 08/30/2021 1501   CL 104 08/30/2021 1501   CO2 28 08/30/2021 1501   BUN 19 08/30/2021 1501   BUN 19 11/29/2020 0000   CREATININE 1.10 08/30/2021 1501   GLU 95 11/29/2020 0000      Component Value Date/Time   CALCIUM 9.1 08/30/2021 1501   ALKPHOS 96 08/30/2021 1501   AST 19 08/30/2021 1501   ALT 19 08/30/2021 1501   BILITOT 0.5 08/30/2021 1501      Impression and Plan: Mr.  Clarence Hopkins is a very nice 88 year old white male.  He has metastatic thyroid cancer.  This is a differentiated thyroid cancer.  Thankfully it is responsive to the radioactive iodine.  He is still responding.  He sees Dr. Hartford Poli for treatment of the  thyroid cancer.  The problem right now is this basal cell carcinoma.  It is under the right eye.  It is right on the lower lid.  Again I will have to talk to his ophthalmologist.  Will be interesting to see what he has to say.  If we need to consider the use of an oral agent - Erivedge -this would be a reasonable way to go.  I would like to see [name] back in about 3 weeks or so.  I think we have to monitor him pretty closely since this basal cell carcinoma is likely going to be some that were going to have to help manage.    [name], MD 7/18/20234:57 PM  ADDENDUM: I spoke to his ophthalmologist.  We talked about starting him on Erivedge.  I think this is reasonable.  This would certainly avoid him having to have radiation hopefully maybe even surgery.  I think the issue is whether or not he is going to be able to tolerate it.  I think the one thing going to have to watch out for is that [name] is on Coumadin.  Because of this, he is going need to have his INR checked.  I told him when I spoke to him on the phone that he needs to have his INR checked week after he starts the [facility].  Cardiology manages the Coumadin for him.  He does understand this.  I we will send the prescription over to the [facility].  They will sort out all the insurance stuff.  I explained everything to [name].  He has a good understanding.  Hopefully, he will see a response to Erivedge.   Lattie Haw, MD

## 2021-08-31 ENCOUNTER — Telehealth: Payer: Self-pay | Admitting: *Deleted

## 2021-08-31 MED ORDER — VISMODEGIB 150 MG PO CAPS
150.0000 mg | ORAL_CAPSULE | Freq: Every day | ORAL | 3 refills | Status: DC
  Filled 2021-08-31: qty 56, 56d supply, fill #0

## 2021-08-31 NOTE — Addendum Note (Signed)
Addended by: [name] R on: 08/31/2021 05:56 PM   Modules accepted: Orders

## 2021-08-31 NOTE — Telephone Encounter (Signed)
Error

## 2021-09-01 ENCOUNTER — Other Ambulatory Visit: Payer: Medicare Other

## 2021-09-01 ENCOUNTER — Other Ambulatory Visit (HOSPITAL_COMMUNITY): Payer: Self-pay

## 2021-09-01 ENCOUNTER — Telehealth: Payer: Self-pay | Admitting: Pharmacy Technician

## 2021-09-01 ENCOUNTER — Ambulatory Visit: Payer: Medicare Other | Admitting: Hematology & Oncology

## 2021-09-01 NOTE — Telephone Encounter (Signed)
Oral Oncology Patient Advocate Encounter  Prior Authorization for [facility] has been approved.    PA# [id number] Effective dates: 09/01/2021 through 02/12/2022  Patients co-pay is $3,136.47.    Lady Deutscher, CPhT-Adv Pharmacy Patient Advocate Specialist [place] Patient Advocate Team Direct Number: [phone]  Fax: [phone]

## 2021-09-01 NOTE — Telephone Encounter (Signed)
Oral Oncology Patient Advocate Encounter   Received notification that prior authorization for Erivedge is required.   PA submitted on 09/01/2021 Key [id number] Status is pending     Lady Deutscher, CPhT-Adv Pharmacy Patient Advocate Specialist [place] Patient Advocate Team Direct Number: [phone]  Fax: [phone]

## 2021-09-05 ENCOUNTER — Telehealth: Payer: Self-pay | Admitting: Pharmacist

## 2021-09-05 NOTE — Telephone Encounter (Addendum)
Oral Oncology Pharmacist Encounter  Received new prescription for Erivedge (vismodegib) for the treatment of basal cell carcinoma, planned duration until disease progression or unacceptable drug toxicity. Patient would like to think about therapy further prior to starting and wait until after next MD OV 09/26/21.  CMP and CBC w/ Diff from 08/30/21 assessed, no baseline dose adjustments required. Patient with INR on 08/30/21 of 3.4. Patient will need to be closely monitored after initiation of Erivedge due to Erivedge risk of increasing INR while on warfarin.   Current medication list in Epic reviewed, DDIs with Erivedge identified: Category C drug-drug interaction between Erivedge and Warfarin - Erivedge may increase INR of warfarin as noted above - recommend close INR monitoring after initiation of Erivedge while patient is on both agents.   Evaluated chart and no patient barriers to medication adherence noted.   Prescription has been e-scribed to the [facility] for benefits analysis and approval.  Oral Oncology Clinic will continue to follow for insurance authorization, copayment issues, initial counseling and start date.  Leron Croak, PharmD, BCPS, BCOP Hematology/Oncology Clinical Pharmacist [facility] [place] [phone] 09/05/2021 9:01 AM

## 2021-09-20 ENCOUNTER — Ambulatory Visit: Payer: Medicare Other

## 2021-09-23 ENCOUNTER — Telehealth: Payer: Self-pay

## 2021-09-23 NOTE — Telephone Encounter (Signed)
Pt missed apt on 8/8. LVM to return call to RS.

## 2021-09-26 ENCOUNTER — Encounter: Payer: Self-pay | Admitting: Hematology & Oncology

## 2021-09-26 ENCOUNTER — Inpatient Hospital Stay: Payer: Medicare Other | Attending: Hematology & Oncology

## 2021-09-26 ENCOUNTER — Other Ambulatory Visit (HOSPITAL_COMMUNITY): Payer: Self-pay

## 2021-09-26 ENCOUNTER — Inpatient Hospital Stay (HOSPITAL_BASED_OUTPATIENT_CLINIC_OR_DEPARTMENT_OTHER): Payer: Medicare Other | Admitting: Hematology & Oncology

## 2021-09-26 VITALS — BP 117/69 | HR 75 | Temp 97.8°F | Resp 20 | Wt 199.5 lb

## 2021-09-26 DIAGNOSIS — Z86718 Personal history of other venous thrombosis and embolism: Secondary | ICD-10-CM | POA: Diagnosis not present

## 2021-09-26 DIAGNOSIS — C44319 Basal cell carcinoma of skin of other parts of face: Secondary | ICD-10-CM | POA: Insufficient documentation

## 2021-09-26 DIAGNOSIS — C73 Malignant neoplasm of thyroid gland: Secondary | ICD-10-CM

## 2021-09-26 DIAGNOSIS — Z7901 Long term (current) use of anticoagulants: Secondary | ICD-10-CM | POA: Insufficient documentation

## 2021-09-26 DIAGNOSIS — Z85828 Personal history of other malignant neoplasm of skin: Secondary | ICD-10-CM | POA: Diagnosis not present

## 2021-09-26 LAB — CBC WITH DIFFERENTIAL (CANCER CENTER ONLY)
Abs Immature Granulocytes: 0.02 10*3/uL (ref 0.00–0.07)
Basophils Absolute: 0 10*3/uL (ref 0.0–0.1)
Basophils Relative: 1 %
Eosinophils Absolute: 0.2 10*3/uL (ref 0.0–0.5)
Eosinophils Relative: 3 %
HCT: 39.8 % (ref 39.0–52.0)
Hemoglobin: 12.5 g/dL — ABNORMAL LOW (ref 13.0–17.0)
Immature Granulocytes: 0 %
Lymphocytes Relative: 23 %
Lymphs Abs: 1.5 10*3/uL (ref 0.7–4.0)
MCH: 27.4 pg (ref 26.0–34.0)
MCHC: 31.4 g/dL (ref 30.0–36.0)
MCV: 87.1 fL (ref 80.0–100.0)
Monocytes Absolute: 0.6 10*3/uL (ref 0.1–1.0)
Monocytes Relative: 9 %
Neutro Abs: 4.1 10*3/uL (ref 1.7–7.7)
Neutrophils Relative %: 64 %
Platelet Count: 213 10*3/uL (ref 150–400)
RBC: 4.57 MIL/uL (ref 4.22–5.81)
RDW: 12.7 % (ref 11.5–15.5)
WBC Count: 6.4 10*3/uL (ref 4.0–10.5)
nRBC: 0 % (ref 0.0–0.2)

## 2021-09-26 LAB — CMP (CANCER CENTER ONLY)
ALT: 16 U/L (ref 0–44)
AST: 18 U/L (ref 15–41)
Albumin: 3.7 g/dL (ref 3.5–5.0)
Alkaline Phosphatase: 98 U/L (ref 38–126)
Anion gap: 6 (ref 5–15)
BUN: 19 mg/dL (ref 8–23)
CO2: 28 mmol/L (ref 22–32)
Calcium: 9.1 mg/dL (ref 8.9–10.3)
Chloride: 104 mmol/L (ref 98–111)
Creatinine: 1.15 mg/dL (ref 0.61–1.24)
GFR, Estimated: >60 mL/min (ref >60)
Glucose, Bld: 124 mg/dL — ABNORMAL HIGH (ref 70–99)
Potassium: 4.4 mmol/L (ref 3.5–5.1)
Sodium: 138 mmol/L (ref 135–145)
Total Bilirubin: 0.6 mg/dL (ref 0.3–1.2)
Total Protein: 6.1 g/dL — ABNORMAL LOW (ref 6.5–8.1)

## 2021-09-26 NOTE — Progress Notes (Signed)
Hematology and Oncology Follow Up Visit  Clarence Hopkins [id number] 15-May-1930 88 y.o. 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  Current Therapy:   S/p I-131 therapy on 08/27/2019 Vismodegib 150 mg po q day -- start on 09/05/2021     Interim History:  Clarence Hopkins is back for follow-up.  We are following along now because of the basal carcinoma under the right eye.  He is seen Radiation Oncology.  They recommended radiation therapy.  He did see an ophthalmologist.  Apparently, the ophthalmologist did not feel that radiation therapy would be appropriate for Clarence Hopkins.  I think he was worried about the possibility of ocular toxicity.  I think the ophthalmologist would like to consider some type of "neoadjuvant" treatment.  I think this would probably be reasonable and then see about resection.  I think he would recommend an oral regimen.  There is a oral agent that is a hedgehog pathway inhibitor.  This is called  Erivedge.  I think this is some that is typically used for local advanced or metastatic disease.  However, I think would be reasonable to try this.  I think the dose of the 150 mg daily.  I had this to Clarence Hopkins and his wife promised to call me in the name of his ophthalmologist so I can talk to him about this.  Otherwise, he seems to be doing about the same.  His wife said that he gets very tired in the afternoon.  I realize that he does have the underlying thyroid cancer.  This could certainly be a factor with him not feeling all that well in the afternoon.  He is eating okay.  He says he gets a little bit choked on food every now then.  He may have a stricture.  He does not want to see a gastroenterologist.  There is been no bleeding.  He has had no problems with the right eye.  He has had no pain.  Overall, I would say his performance status is probably ECOG 2.    Medications:  Current Outpatient  Medications:  .  Cholecalciferol (VITAMIN D3) 50 MCG (2000 UT) capsule, Take 1 capsule (2,000 Units total) by mouth daily., Disp: 100 capsule, Rfl: 3 .  levothyroxine (SYNTHROID) 200 MCG tablet, Take 200 mcg by mouth daily., Disp: , Rfl:  .  levothyroxine (SYNTHROID) 25 MCG tablet, Take 25 mcg by mouth daily. Take with 200 mcg tablet to total 225 mcg dose by Dr. Hartford Hopkins, Disp: , Rfl:  .  tamsulosin (FLOMAX) 0.4 MG CAPS capsule, 1 po qhs, Disp: 90 capsule, Rfl: 3 .  vismodegib (ERIVEDGE) 150 MG capsule, Take 1 capsule (150 mg total) by mouth daily., Disp: 30 capsule, Rfl: 3 .  warfarin (COUMADIN) 5 MG tablet, Take 5 mg on  Tue, Thur, Sat, Sun. Take 2.5 mg on Mon, Wed, Fri, Disp: 100 tablet, Rfl: 3 .  finasteride (PROSCAR) 5 MG tablet, Take 1 tablet (5 mg total) by mouth daily. (Patient not taking: Reported on 09/26/2021), Disp: 90 tablet, Rfl: 3 .  mupirocin ointment (BACTROBAN) 2 %, Apply 1 application topically daily. (Patient not taking: Reported on 09/26/2021), Disp: , Rfl:  .  polyvinyl alcohol (LIQUIFILM TEARS) 1.4 % ophthalmic solution, Place 1 drop into both eyes as needed for dry eyes. (Patient not taking: Reported on 09/26/2021), Disp: , Rfl:  .  sennosides-docusate sodium (SENOKOT-S) 8.6-50 MG tablet, Take 1 tablet by mouth  daily. (Patient not taking: Reported on 09/26/2021), Disp: 100 tablet, Rfl: 3 .  traMADol (ULTRAM) 50 MG tablet, Take 1 tablet (50 mg total) by mouth 2 (two) times daily as needed for severe pain or moderate pain. (Patient not taking: Reported on 09/26/2021), Disp: 60 tablet, Rfl: 2  Allergies: No Known Allergies  Past Medical History, Surgical history, Social history, and Family History were reviewed and updated.  Review of Systems: Review of Systems  Constitutional: Negative.   HENT:  Negative.    Eyes: Negative.   Respiratory: Negative.    Cardiovascular: Negative.   Gastrointestinal: Negative.   Endocrine: Negative.   Genitourinary: Negative.    Musculoskeletal:  Negative.   Skin: Negative.   Neurological: Negative.   Hematological: Negative.   Psychiatric/Behavioral: Negative.      Physical Exam:  weight is 199 lb 8 oz (90.5 kg). His oral temperature is 97.8 F (36.6 C). His blood pressure is 117/69 and his pulse is 75. His respiration is 20 and oxygen saturation is 95%.   Wt Readings from Last 3 Encounters:  09/26/21 199 lb 8 oz (90.5 kg)  08/30/21 201 lb (91.2 kg)  07/13/21 203 lb (92.1 kg)    Physical Exam Vitals reviewed.  HENT:     Head: Normocephalic and atraumatic.     Comments: Under the right eye is a nodule.  This probably measures about 5 mm x 2 mm.  It is firm and not mobile. Eyes:     Pupils: Pupils are equal, round, and reactive to light.  Cardiovascular:     Heart sounds: Normal heart sounds.     Comments: Cardiac exam is slow but regular.  He does have occasional extra beats.  He does have an occasional skipped beat.  He has 1/6 systolic ejection murmur. Pulmonary:     Effort: Pulmonary effort is normal.     Breath sounds: Normal breath sounds.  Abdominal:     General: Bowel sounds are normal.     Palpations: Abdomen is soft.  Musculoskeletal:        General: No tenderness or deformity. Normal range of motion.     Cervical back: Normal range of motion.  Lymphadenopathy:     Cervical: No cervical adenopathy.  Skin:    General: Skin is warm and dry.     Findings: No erythema or rash.  Neurological:     Mental Status: He is alert and oriented to person, place, and time.  Psychiatric:        Behavior: Behavior normal.        Thought Content: Thought content normal.        Judgment: Judgment normal.     Lab Results  Component Value Date   WBC 6.4 09/26/2021   HGB 12.5 (L) 09/26/2021   HCT 39.8 09/26/2021   MCV 87.1 09/26/2021   PLT 213 09/26/2021     Chemistry      Component Value Date/Time   NA 138 09/26/2021 1332   NA 142 11/29/2020 0000   K 4.4 09/26/2021 1332   CL 104 09/26/2021 1332   CO2 28  09/26/2021 1332   BUN 19 09/26/2021 1332   BUN 19 11/29/2020 0000   CREATININE 1.15 09/26/2021 1332   GLU 95 11/29/2020 0000      Component Value Date/Time   CALCIUM 9.1 09/26/2021 1332   ALKPHOS 98 09/26/2021 1332   AST 18 09/26/2021 1332   ALT 16 09/26/2021 1332   BILITOT 0.6 09/26/2021 1332  Impression and Plan: Mr. Gadson is a very nice 88 year old white male.  He has metastatic thyroid cancer.  This is a differentiated thyroid cancer.  Thankfully it is responsive to the radioactive iodine.  He is still responding.  He sees Dr. Hartford Hopkins for treatment of the thyroid cancer.  The problem right now is this basal cell carcinoma.  It is under the right eye.  It is right on the lower lid.  Again I will have to talk to his ophthalmologist.  Will be interesting to see what he has to say.  If we need to consider the use of an oral agent - Erivedge -this would be a reasonable way to go.  I would like to see Mr. Prosise back in about 3 weeks or so.  I think we have to monitor him pretty closely since this basal cell carcinoma is likely going to be some that were going to have to help manage.    [name], MD 8/14/20232:08 PM  ADDENDUM: I spoke to his ophthalmologist.  We talked about starting him on Erivedge.  I think this is reasonable.  This would certainly avoid him having to have radiation hopefully maybe even surgery.  I think the issue is whether or not he is going to be able to tolerate it.  I think the one thing going to have to watch out for is that Mr. Hanken is on Coumadin.  Because of this, he is going need to have his INR checked.  I told him when I spoke to him on the phone that he needs to have his INR checked week after he starts the [place].  Cardiology manages the Coumadin for him.  He does understand this.  I we will send the prescription over to the [facility].  They will sort out all the insurance stuff.  I explained everything to Mr. Palmateer.  He has a  good understanding.  Hopefully, he will see a response to Erivedge.   Lattie Haw, MD

## 2021-09-28 NOTE — Telephone Encounter (Signed)
Pt has been RS for 8/22.

## 2021-10-04 ENCOUNTER — Ambulatory Visit (INDEPENDENT_AMBULATORY_CARE_PROVIDER_SITE_OTHER): Payer: Medicare Other

## 2021-10-04 DIAGNOSIS — Z7901 Long term (current) use of anticoagulants: Secondary | ICD-10-CM

## 2021-10-04 LAB — POCT INR: INR: 2.6 (ref 2.0–3.0)

## 2021-10-04 NOTE — Patient Instructions (Addendum)
Pre visit review using our clinic review tool, if applicable. No additional management support is needed unless otherwise documented below in the visit note.  Continue 1 tablet daily except take 1/2 tablet on Mondays, Wednesdays, and Fridays. Recheck in 4 weeks.

## 2021-10-04 NOTE — Progress Notes (Cosign Needed Addendum)
Continue 1 tablet daily except take 1/2 tablet on Mondays, Wednesdays, and Fridays. Recheck in 4 weeks.   Medical screening examination/treatment/procedure(s) were performed by non-physician practitioner and as supervising physician I was immediately available for consultation/collaboration.  I agree with above. Lew Dawes, MD

## 2021-10-07 ENCOUNTER — Encounter: Payer: Self-pay | Admitting: Family Medicine

## 2021-10-07 ENCOUNTER — Ambulatory Visit (INDEPENDENT_AMBULATORY_CARE_PROVIDER_SITE_OTHER): Payer: Medicare Other | Admitting: Family Medicine

## 2021-10-07 ENCOUNTER — Ambulatory Visit (INDEPENDENT_AMBULATORY_CARE_PROVIDER_SITE_OTHER): Payer: Medicare Other

## 2021-10-07 VITALS — BP 120/70 | HR 67 | Temp 97.3°F | Ht 75.2 in | Wt 194.0 lb

## 2021-10-07 DIAGNOSIS — R0981 Nasal congestion: Secondary | ICD-10-CM

## 2021-10-07 DIAGNOSIS — R058 Other specified cough: Secondary | ICD-10-CM | POA: Diagnosis not present

## 2021-10-07 DIAGNOSIS — R059 Cough, unspecified: Secondary | ICD-10-CM | POA: Diagnosis not present

## 2021-10-07 LAB — POC COVID19 BINAXNOW: SARS Coronavirus 2 Ag: NEGATIVE

## 2021-10-07 MED ORDER — BENZONATATE 200 MG PO CAPS: 200.0000 mg | ORAL_CAPSULE | Freq: Two times a day (BID) | ORAL | 0 refills | Status: DC | PRN

## 2021-10-07 MED ORDER — AMOXICILLIN 875 MG PO TABS: 875.0000 mg | ORAL_TABLET | Freq: Two times a day (BID) | ORAL | 0 refills | Status: AC

## 2021-10-07 MED ORDER — GUAIFENESIN ER 600 MG PO TB12: 600.0000 mg | ORAL_TABLET | Freq: Two times a day (BID) | ORAL | 0 refills | Status: DC

## 2021-10-07 NOTE — Progress Notes (Unsigned)
Subjective: Chief Complaint  Patient presents with   Cough    Has been going on for about 2 days. Wife is in 2nd week of bronchitis and he believes he has it as well.     Fever    Wife reports he had a fever last night at 101, denies fever today   Nasal Congestion     Clarence Hopkins is a 88 y.o. male who presents for a 24 hour history of fatigue, fever, nasal congestion, rhinorrhea, and cough. Coughing up white-yellowish sputum.   Denies dizziness, chest pain, palpitations, shortness of breath, abdominal pain, N/VD.   Treatment to date: {[name] tx:14268}.  Denies sick contacts.  No other aggravating or relieving factors.  No other c/o.  ROS as in subjective.   Objective: Vitals:   10/07/21 1505  BP: 120/70  Pulse: 67  Temp: (!) 97.3 F (36.3 C)  SpO2: 98%    General appearance: Alert, WD/WN, no distress, mildly ill appearing                             Skin: warm, no rash                           Head: no sinus tenderness                            Eyes: conjunctiva normal, corneas clear, PERRLA                            Ears: pearly TMs, external ear canals normal                          Nose: septum midline, turbinates swollen, with erythema and clear discharge             Mouth/throat: MMM, tongue normal, mild pharyngeal erythema                           Neck: supple, no adenopathy, no thyromegaly, nontender                          Heart: RRR, normal S1, S2, no murmurs                         Lungs: CTA bilaterally, no wheezes, rales, or rhonchi      Assessment:   Plan: Discussed diagnosis and treatment of [name].  Suggested symptomatic OTC remedies. Nasal saline spray for congestion.  Tylenol or Ibuprofen OTC for fever and malaise.  Call/return in 2-3 days if symptoms aren't resolving.

## 2021-10-07 NOTE — Progress Notes (Signed)
Please call him and let him know that his x-ray does not show anything acute, no pneumonia at this time.  It is okay to proceed with the recommendations in the office.

## 2021-10-07 NOTE — Patient Instructions (Signed)
  Please go downstairs for a chest x-ray before you leave today.  You may take guaifenesin and Tessalon for cough.  I sent both of these prescriptions to your pharmacy.  It is important that you drink plenty of water with these medications.  If you have fever again, you can take Tylenol.  If you are worsening over the weekend, you may start the antibiotic, amoxicillin, I sent this to your pharmacy as well. If you are feeling better then do not take the antibiotic.  Follow-up as needed.

## 2021-10-19 DIAGNOSIS — R3915 Urgency of urination: Secondary | ICD-10-CM | POA: Diagnosis not present

## 2021-10-19 DIAGNOSIS — R3912 Poor urinary stream: Secondary | ICD-10-CM | POA: Diagnosis not present

## 2021-10-19 DIAGNOSIS — N401 Enlarged prostate with lower urinary tract symptoms: Secondary | ICD-10-CM | POA: Diagnosis not present

## 2021-10-19 DIAGNOSIS — R3914 Feeling of incomplete bladder emptying: Secondary | ICD-10-CM | POA: Diagnosis not present

## 2021-10-20 ENCOUNTER — Other Ambulatory Visit (HOSPITAL_COMMUNITY): Payer: Self-pay

## 2021-10-21 ENCOUNTER — Telehealth: Payer: Self-pay

## 2021-10-21 NOTE — Telephone Encounter (Signed)
Pt LVM he needed to RS coumadin clinic apt on 9/19 to 9/18 at [facility] if possible. LVM for pt to return call. Can RS to [facility], only openings are in afternoon on 9/18

## 2021-10-24 ENCOUNTER — Inpatient Hospital Stay: Payer: Medicare Other | Admitting: Hematology & Oncology

## 2021-10-24 ENCOUNTER — Inpatient Hospital Stay: Payer: Medicare Other

## 2021-10-31 NOTE — Telephone Encounter (Signed)
Pt returned call and reports he has other apts on 9/19 and will need to RS to 9/22. RS apt for coumadin clinic for 9/22. Pt verbalized understanding.

## 2021-11-01 ENCOUNTER — Inpatient Hospital Stay: Payer: Medicare Other

## 2021-11-01 ENCOUNTER — Inpatient Hospital Stay: Payer: Medicare Other | Attending: Hematology & Oncology | Admitting: Hematology & Oncology

## 2021-11-01 ENCOUNTER — Ambulatory Visit (HOSPITAL_BASED_OUTPATIENT_CLINIC_OR_DEPARTMENT_OTHER)
Admission: RE | Admit: 2021-11-01 | Discharge: 2021-11-01 | Disposition: A | Discharge status: Home / Self Care | Payer: Medicare Other | Source: Ambulatory Visit | Attending: Hematology & Oncology | Admitting: Hematology & Oncology

## 2021-11-01 ENCOUNTER — Other Ambulatory Visit: Payer: Medicare Other

## 2021-11-01 ENCOUNTER — Encounter: Payer: Self-pay | Admitting: Hematology & Oncology

## 2021-11-01 ENCOUNTER — Other Ambulatory Visit (HOSPITAL_COMMUNITY): Payer: Self-pay

## 2021-11-01 ENCOUNTER — Ambulatory Visit: Payer: Medicare Other

## 2021-11-01 ENCOUNTER — Encounter (HOSPITAL_BASED_OUTPATIENT_CLINIC_OR_DEPARTMENT_OTHER): Payer: Self-pay

## 2021-11-01 VITALS — BP 146/68 | HR 69 | Temp 97.5°F | Resp 18 | Ht 75.0 in | Wt 199.5 lb

## 2021-11-01 DIAGNOSIS — Z85828 Personal history of other malignant neoplasm of skin: Secondary | ICD-10-CM

## 2021-11-01 DIAGNOSIS — R918 Other nonspecific abnormal finding of lung field: Secondary | ICD-10-CM | POA: Diagnosis not present

## 2021-11-01 DIAGNOSIS — Z7901 Long term (current) use of anticoagulants: Secondary | ICD-10-CM | POA: Insufficient documentation

## 2021-11-01 DIAGNOSIS — C44319 Basal cell carcinoma of skin of other parts of face: Secondary | ICD-10-CM | POA: Insufficient documentation

## 2021-11-01 DIAGNOSIS — Z86718 Personal history of other venous thrombosis and embolism: Secondary | ICD-10-CM | POA: Diagnosis not present

## 2021-11-01 DIAGNOSIS — C73 Malignant neoplasm of thyroid gland: Secondary | ICD-10-CM | POA: Insufficient documentation

## 2021-11-01 LAB — CBC WITH DIFFERENTIAL (CANCER CENTER ONLY)
Abs Immature Granulocytes: 0.01 10*3/uL (ref 0.00–0.07)
Basophils Absolute: 0 10*3/uL (ref 0.0–0.1)
Basophils Relative: 1 %
Eosinophils Absolute: 0.2 10*3/uL (ref 0.0–0.5)
Eosinophils Relative: 4 %
HCT: 43.1 % (ref 39.0–52.0)
Hemoglobin: 13.3 g/dL (ref 13.0–17.0)
Immature Granulocytes: 0 %
Lymphocytes Relative: 30 %
Lymphs Abs: 1.6 10*3/uL (ref 0.7–4.0)
MCH: 26.8 pg (ref 26.0–34.0)
MCHC: 30.9 g/dL (ref 30.0–36.0)
MCV: 86.9 fL (ref 80.0–100.0)
Monocytes Absolute: 0.5 10*3/uL (ref 0.1–1.0)
Monocytes Relative: 10 %
Neutro Abs: 3 10*3/uL (ref 1.7–7.7)
Neutrophils Relative %: 55 %
Platelet Count: 198 10*3/uL (ref 150–400)
RBC: 4.96 MIL/uL (ref 4.22–5.81)
RDW: 13.1 % (ref 11.5–15.5)
WBC Count: 5.4 10*3/uL (ref 4.0–10.5)
nRBC: 0 % (ref 0.0–0.2)

## 2021-11-01 LAB — PROTIME-INR
INR: 1.3 — ABNORMAL HIGH (ref 0.8–1.2)
Prothrombin Time: 16.4 seconds — ABNORMAL HIGH (ref 11.4–15.2)

## 2021-11-01 LAB — CMP (CANCER CENTER ONLY)
ALT: 17 U/L (ref 0–44)
AST: 21 U/L (ref 15–41)
Albumin: 3.9 g/dL (ref 3.5–5.0)
Alkaline Phosphatase: 95 U/L (ref 38–126)
Anion gap: 5 (ref 5–15)
BUN: 16 mg/dL (ref 8–23)
CO2: 31 mmol/L (ref 22–32)
Calcium: 9.7 mg/dL (ref 8.9–10.3)
Chloride: 103 mmol/L (ref 98–111)
Creatinine: 1.06 mg/dL (ref 0.61–1.24)
GFR, Estimated: >60 mL/min (ref >60)
Glucose, Bld: 94 mg/dL (ref 70–99)
Potassium: 5 mmol/L (ref 3.5–5.1)
Sodium: 139 mmol/L (ref 135–145)
Total Bilirubin: 0.9 mg/dL (ref 0.3–1.2)
Total Protein: 7.4 g/dL (ref 6.5–8.1)

## 2021-11-01 LAB — LACTATE DEHYDROGENASE: LDH: 140 U/L (ref 98–192)

## 2021-11-01 MED ORDER — IOHEXOL 300 MG/ML  SOLN
100.0000 mL | Freq: Once | INTRAMUSCULAR | Status: AC | PRN
  Administered 2021-11-01: 80 mL via INTRAVENOUS

## 2021-11-01 MED ORDER — VISMODEGIB 150 MG PO CAPS
150.0000 mg | ORAL_CAPSULE | Freq: Every day | ORAL | 3 refills | Status: DC
  Filled 2021-11-01: qty 56, 56d supply, fill #0
  Filled 2021-11-16: qty 28, 28d supply, fill #0

## 2021-11-01 NOTE — Progress Notes (Signed)
Hematology and Oncology Follow Up Visit  ADHRIT KRENZ [id number] [birth year] 88 y.o. 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  Current Therapy:   S/p I-131 therapy on 08/27/2019 Vismodegib 150 mg po q day -- start on 11/05/2021     Interim History:  Mr. Mosqueda is back for follow-up.  He says that he will now start the vismodegib.  I know we have talked about this before.  The lesion under his right eye can certainly is enlarging.  It is still not all that large but yet is in the very bad area that will affect his lower eyelid.  We will resend the vismodegib prescription for him.  He also had a CT scan done today.  The CT scan was done of his chest to evaluate for any growth in the metastatic thyroid cancer.  Everything looked essentially stable.  There may have been some slight increase in size of a couple lung nodules but again everything looks relatively stable.  He still has the same issues with respect to weakness.  Again I did have a hard time believing this is anything related to him having cancer.  He does not like taking medications.  He still does not take any vitamin B12.  He does see the endocrinologist I think later this week or next week.  He has had no problems with pain.  He has had no problems with bleeding.  He is on Coumadin.  There is no diarrhea.  He does have a little bit of leg swelling which is chronic.  Currently, I would say his performance status is probably ECOG 2.     Medications:  Current Outpatient Medications:    Cholecalciferol (VITAMIN D3) 50 MCG (2000 UT) capsule, Take 1 capsule (2,000 Units total) by mouth daily., Disp: 100 capsule, Rfl: 3   levothyroxine (SYNTHROID) 200 MCG tablet, Take 200 mcg by mouth daily., Disp: , Rfl:    levothyroxine (SYNTHROID) 25 MCG tablet, Take 25 mcg by mouth daily. Take with 200 mcg tablet to total 225 mcg dose by Dr. Hartford Poli, Disp: , Rfl:     Polyethylene Glycol 3350 (MIRALAX PO), Take by mouth., Disp: , Rfl:    tamsulosin (FLOMAX) 0.4 MG CAPS capsule, 1 po qhs, Disp: 90 capsule, Rfl: 3   warfarin (COUMADIN) 5 MG tablet, Take 5 mg on  Tue, Thur, Sat, Sun. Take 2.5 mg on Mon, Wed, Fri, Disp: 100 tablet, Rfl: 3   benzonatate (TESSALON) 200 MG capsule, Take 1 capsule (200 mg total) by mouth 2 (two) times daily as needed for cough., Disp: 20 capsule, Rfl: 0   finasteride (PROSCAR) 5 MG tablet, Take 1 tablet (5 mg total) by mouth daily. (Patient not taking: Reported on 11/01/2021), Disp: 90 tablet, Rfl: 3   guaiFENesin (MUCINEX) 600 MG 12 hr tablet, Take 1 tablet (600 mg total) by mouth 2 (two) times daily., Disp: 30 tablet, Rfl: 0   mupirocin ointment (BACTROBAN) 2 %, Apply 1 application  topically daily., Disp: , Rfl:    polyvinyl alcohol (LIQUIFILM TEARS) 1.4 % ophthalmic solution, Place 1 drop into both eyes as needed for dry eyes., Disp: , Rfl:    sennosides-docusate sodium (SENOKOT-S) 8.6-50 MG tablet, Take 1 tablet by mouth daily., Disp: 100 tablet, Rfl: 3   traMADol (ULTRAM) 50 MG tablet, Take 1 tablet (50 mg total) by mouth 2 (two) times daily as needed for severe pain or moderate pain., Disp:  60 tablet, Rfl: 2   vismodegib (ERIVEDGE) 150 MG capsule, Take 1 capsule (150 mg total) by mouth daily. (Patient not taking: Reported on 10/07/2021), Disp: 30 capsule, Rfl: 3  Allergies: No Known Allergies  Past Medical History, Surgical history, Social history, and Family History were reviewed and updated.  Review of Systems: Review of Systems  Constitutional: Negative.   HENT:  Negative.    Eyes: Negative.   Respiratory: Negative.    Cardiovascular: Negative.   Gastrointestinal: Negative.   Endocrine: Negative.   Genitourinary: Negative.    Musculoskeletal: Negative.   Skin: Negative.   Neurological: Negative.   Hematological: Negative.   Psychiatric/Behavioral: Negative.      Physical Exam:  height is 6\' 3"  (1.905 m) and  weight is 199 lb 8 oz (90.5 kg). His oral temperature is 97.5 F (36.4 C) (abnormal). His blood pressure is 146/68 (abnormal) and his pulse is 69. His respiration is 18 and oxygen saturation is 98%.   Wt Readings from Last 3 Encounters:  11/01/21 199 lb 8 oz (90.5 kg)  10/07/21 194 lb (88 kg)  09/26/21 199 lb 8 oz (90.5 kg)    Physical Exam Vitals reviewed.  HENT:     Head: Normocephalic and atraumatic.     Comments: Under the right eye is a nodule.  This probably measures about 5 mm x 2 mm.  It is firm and not mobile. Eyes:     Pupils: Pupils are equal, round, and reactive to light.  Cardiovascular:     Heart sounds: Normal heart sounds.     Comments: Cardiac exam is slow but regular.  He does have occasional extra beats.  He does have an occasional skipped beat.  He has 1/6 systolic ejection murmur. Pulmonary:     Effort: Pulmonary effort is normal.     Breath sounds: Normal breath sounds.  Abdominal:     General: Bowel sounds are normal.     Palpations: Abdomen is soft.  Musculoskeletal:        General: No tenderness or deformity. Normal range of motion.     Cervical back: Normal range of motion.  Lymphadenopathy:     Cervical: No cervical adenopathy.  Skin:    General: Skin is warm and dry.     Findings: No erythema or rash.  Neurological:     Mental Status: He is alert and oriented to person, place, and time.  Psychiatric:        Behavior: Behavior normal.        Thought Content: Thought content normal.        Judgment: Judgment normal.     Lab Results  Component Value Date   WBC 5.4 11/01/2021   HGB 13.3 11/01/2021   HCT 43.1 11/01/2021   MCV 86.9 11/01/2021   PLT 198 11/01/2021     Chemistry      Component Value Date/Time   NA 139 11/01/2021 0901   NA 142 11/29/2020 0000   K 5.0 11/01/2021 0901   CL 103 11/01/2021 0901   CO2 31 11/01/2021 0901   BUN 16 11/01/2021 0901   BUN 19 11/29/2020 0000   CREATININE 1.06 11/01/2021 0901   GLU 95 11/29/2020  0000      Component Value Date/Time   CALCIUM 9.7 11/01/2021 0901   ALKPHOS 95 11/01/2021 0901   AST 21 11/01/2021 0901   ALT 17 11/01/2021 0901   BILITOT 0.9 11/01/2021 0901      Impression and Plan: Mr. Dino is  a very nice 88 year old white male.  He has metastatic thyroid cancer.  This is a differentiated thyroid cancer.  Thankfully it is responsive to the radioactive iodine.  He is still responding.  He sees Dr. Hartford Poli for treatment of the thyroid cancer.  He has a basal cell carcinoma under the right eye.  He says that he will take the vismodegib.  Hopefully, he will keep his promises.  If he does not, I suppose I should not be all that surprised.  His daughter was listening on the phone.  I told her that the pharmacist will call him and talk to him more about cost, side effects, and any interactions.  I would like to hope that we will see a response.  I had believe that we will see a response.  As far as her thyroid cancer is concerned, I think this is holding pretty stable.  I have to probably get her back in about 1 month now.  We will see if he does start the vismodegib.    [name], MD 9/19/202312:23 PM

## 2021-11-02 ENCOUNTER — Other Ambulatory Visit (HOSPITAL_COMMUNITY): Payer: Self-pay

## 2021-11-02 ENCOUNTER — Telehealth: Payer: Self-pay | Admitting: Pharmacy Technician

## 2021-11-02 NOTE — Telephone Encounter (Signed)
Oral Oncology Patient Advocate Encounter   Began application for assistance for Erivedge through [place].   Application submitted 93/23/5573 to [employer] phone number [phone].   I will continue to check the status until final determination.   Lady Deutscher, CPhT-Adv Oncology Pharmacy Patient [place] Direct Number: [phone]  Fax: [phone]

## 2021-11-03 ENCOUNTER — Other Ambulatory Visit (HOSPITAL_COMMUNITY): Payer: Self-pay

## 2021-11-03 DIAGNOSIS — E890 Postprocedural hypothyroidism: Secondary | ICD-10-CM | POA: Diagnosis not present

## 2021-11-03 NOTE — Telephone Encounter (Signed)
Oral Oncology Patient Advocate Encounter   Received notification that the application for assistance for Erivedge through [place] has been denied due to Exceeds Income Limit.   [employer] phone number [phone].   I have spoken to the patient.   Berdine Addison, [place] [facility] Patient [place]  [phone] (phone) [phone] (fax)

## 2021-11-04 ENCOUNTER — Ambulatory Visit (INDEPENDENT_AMBULATORY_CARE_PROVIDER_SITE_OTHER): Payer: Medicare Other

## 2021-11-04 DIAGNOSIS — Z7901 Long term (current) use of anticoagulants: Secondary | ICD-10-CM | POA: Diagnosis not present

## 2021-11-04 LAB — POCT INR: INR: 1.3 — AB (ref 2.0–3.0)

## 2021-11-04 NOTE — Patient Instructions (Addendum)
Pre visit review using our clinic review tool, if applicable. No additional management support is needed unless otherwise documented below in the visit note.  Increase dose today to take 1 tablet and then increase dose tomorrow to take 1 1/2 tablets and the continue 1 tablet daily except take 1/2 tablet on Mondays, Wednesdays, and Fridays. Recheck in 1 weeks.

## 2021-11-04 NOTE — Progress Notes (Addendum)
Contacted pt's wife, Mcneil Sober, because she takes care of pt's medications. She reports pt is taking warfarin as prescribed and she does not think he missed any doses but she is not sure. There are no tablets left in the pill box. Pt has decided that he is going to stop all medication except warfarin and synthroid and will also take the oral chemotherapy when he can get that if he can afford it. Advised [name] to increase dose today and tomorrow and then resume normal dosing and pt has f/u apt on 10/3. Advised if any other changes to contact the coumadin clinic. [name] verbalized understanding.   Increase dose today to take 1 tablet and then increase dose tomorrow to take 1 1/2 tablets and the continue 1 tablet daily except take 1/2 tablet on Mondays, Wednesdays, and Fridays. Recheck in 1 weeks.

## 2021-11-07 ENCOUNTER — Telehealth: Payer: Self-pay | Admitting: *Deleted

## 2021-11-07 NOTE — Telephone Encounter (Signed)
Received following message from Wells Guiles in Pharmacy that it sounds like rad/onc reached out to him to discuss radiation vs Erivedge since the cost of the oral pill is so high and he doesn't qualify for manufacturer assistance (his income is too high) - the patient still wants to talk to Dr. Marin Olp and overall seems super confused about radiation (which sounds more affordable?) vs oral chemo.Marland Kitchen  Spoke with Dr Marin Olp about this.  Dr Marin Olp recommends patient calling RadOnc to make an appt to discuss RT with them since medication is too expensive.  Called patient and LVMAM with above information.

## 2021-11-07 NOTE — Progress Notes (Signed)
Addendum created for co-signature

## 2021-11-08 ENCOUNTER — Telehealth: Payer: Self-pay

## 2021-11-08 NOTE — Telephone Encounter (Signed)
Received phone call from patient who was confused on the voicemail that he received yesterday from another nurse. Pt educated that at this time Dr. Marin Olp recommends that he make an appointment with Radiation Oncology to discuss his options with radiation therapy vs oral medication. Pt verbalized understanding and stated he would make appointment. Pt aware he can call with any other questions or concerns. Pt verbalized understanding and had no further questions.

## 2021-11-09 ENCOUNTER — Other Ambulatory Visit (HOSPITAL_COMMUNITY): Payer: Self-pay

## 2021-11-09 ENCOUNTER — Telehealth: Payer: Self-pay | Admitting: *Deleted

## 2021-11-09 NOTE — Telephone Encounter (Signed)
Message received from patient wanting to know if he needs a CT scan done before his next appt on 11/28/21.  Call placed back to patient and patient notified that he does not need another CT scan done at this time per order of Dr. Marin Olp.  Pt is appreciative of call back and has no other questions at this time.

## 2021-11-11 NOTE — Telephone Encounter (Signed)
Oral Chemotherapy Pharmacist Encounter   Patient not proceeding with Erivedge (vismodegib) due to high cost. Oral chemotherapy clinic attempted to sign patient up for patient assistance through [place], but patient exceeded income requirements for qualification. Patient income also exceeds that for qualification for PAN grant for his disease state.   Oral chemotherapy clinic will stop following at this time.   Leron Croak, PharmD, BCPS, BCOP Hematology/Oncology Clinical Pharmacist [facility] [place] [phone] 11/11/2021 9:07 AM

## 2021-11-13 NOTE — Progress Notes (Signed)
Radiation Oncology         [phone] ________________________________  Follow-up New Visit   Outpatient   Name: Clarence Hopkins MRN: [id number]  Date: 11/14/2021  DOB: [birth year]  CB:ULAGTXMIW, Evie Lacks, MD  [name], MD   REFERRING PHYSICIAN: [name], MD  DIAGNOSIS: No diagnosis found.   Cancer Staging  Basal cell carcinoma of lower eyelid, right Staging form: Eyelid Carcinoma, AJCC 8th Edition - Clinical stage from 07/05/2021: cT2, cN0, cM0 - Signed by Eppie Gibson, MD on 07/05/2021 Stage prefix: Initial diagnosis   CHIEF COMPLAINT: Here to discuss management of eyelid cancer  Narrative / Interval History ::Clarence Hopkins is a 88 y.o. male who returns today for further discussion and consideration of radiation therapy in management of his diagnosed eyelid cancer. To review from his initial consultation, the patient was agreeable to undergo radiation. I conferred with Dr Sabino Dick and he exressed that surgery may be an option, pending in-person evaluation. I also recommended an MRI of the orbits at his recommendation to rule out locally advanced disease, and referred the patient to Dr. Marin Olp to discuss immunotherapy as a measure to prevent future skin cancers.   MRI of the orbits with and without contrast on 07/09/21 showed the right orbit inferior preseptal soft tissue mass with enhancement, encompassing an area measuring 5 x 11 x 7 mm, and compatible with known skin cancer. MRI otherwise showed the bilateral orbit structures to appear symmetric and normal.  The patient then met with Dr. Marin Olp on 07/13/21. During which time, Dr. Marin Olp agreed that radiation would be the best mode of treatment to the site of BCC, vs resectioning. In terms of systemic treatment, Dr. Marin Olp did not see an indication for this in the patient's situation.   (The patient also had a chest CT performed on 07/13/21 which showed stable findings in the lungs overall. No evidence of mediastinal mass  or adenopathy was appreciated).   However, the patient met with ophthalmology and they did not feel that radiation therapy would be appropriate due to concerns of occular toxicity. Given so, opthalmology recommended neoadjuvant treatment followed by possible resectioning. Dr. Marin Olp reached out to the patient's ophthalmologist and they both agreed that oral Erivedge would be a reasonable treatment route to take, and the patient consented to starting treatment.   However, the patient did not start his treatment an intended in July due to fear of taking more medications. He was encouraged to start his oral chemo given that his only other viable option would be radiation. He also followed up with Dr. Marin Olp on that same date and the eyelid lesion was noted to have visibly enlarged.    CT scan of the chest on 11/01/21 showed a slight increase in size of a couple of the lung nodules but relatively stable findings overall.   Apparently, the cost of Erivedge is quite high with the patients insurance. Given so, Dr. Marin Olp recommended the patient to return today for further consideration and discussion of radiation therapy as a treatment option.   HPI Initial Consultation 07/05/21 ::Clarence Hopkins is a 88 y.o. male with a significant history of sun exposure and daily alcohol consumption who presents today for consideration of radiation therapy in management of a problematic site of BCC to the right lower eyelid. The patient has an extensive skin cancer history detailed below.   The patient recently followed up with his dermatologist Dr. Ronnald Ramp on 06/02/21. During this visit, the patient reported enlargement of a  site of BCC involving most of the right lower eyelid, causing subsequent consistent irritation. This site was treated in 2009 with Mohs (01/29/2008) and [facility]culopl[facility]. He later had a recurrence at this same site in 2018 and was evaluated by [facility] regarding further surgery. He however declined  pursuing surgery due to other issues at the time including melanoma and metastatic thyroid cancer. In the coming years, he continued to decline any intervention to this site given that he was asymptomatic. Given his new issues above, the patient was (and is) agreeable to discuss radiation to treat this lesion. The patient also had biopsies collected from lesions on his left arm and right cheek during this visit though results are not available at this time.    Of note: The patient has a history of thyroid cancer, s/p partial left thyroidectomy in 1974. In 2017 he had nodal biopsies which revealed metastatic papillary thyroid cancer, and he was later found to have pulmonary and liver metastases. The patient established care with Dr. Marin Olp in 2018. His disease was responsive to radioactive iodine (dose: 158.7 mCi of I-131), which he received on 08/27/2019 with Dr. Hartford Poli at [facility].  When he was seen in medical oncology earlier this year scans showed stability of disease.  He continues to follow closely with Dr. Marin Olp   The patient was seen by oculoplastic surgery at [facility] in 2019 but due to other health issues at the time he declined salvage surgery. Jeni Salles, MD had performed a shave biopsy in January 2019 showing basal cell carcinoma, nodular type, present in base of biopsy.    The lesion on his right lower eyelid has grown since then.  It is starting to have irritative symptoms.         PREVIOUS RADIATION THERAPY: No  PAST MEDICAL HISTORY:  has a past medical history of Arthritis, Cancer ([place]) (2003), GERD (gastroesophageal reflux disease), Hypothyroidism, Lung mass, and Pulmonary embolism ([place]) (2005 & 2007).    PAST SURGICAL HISTORY: Past Surgical History:  Procedure Laterality Date   BRONCHIAL BRUSHINGS  07/15/2019   Procedure: BRONCHIAL BRUSHINGS;  Surgeon: Rigoberto Noel, MD;  Location: [facility] ENDOSCOPY;  Service: Cardiopulmonary;;   BRONCHIAL WASHINGS  07/15/2019   Procedure:  BRONCHIAL WASHINGS;  Surgeon: Rigoberto Noel, MD;  Location: [place] ENDOSCOPY;  Service: Cardiopulmonary;;   CARDIAC CATHETERIZATION     > 20 years ago " everything was okay"    CERVICAL FUSION      X 2; Dr Vertell Limber   COLONOSCOPY     ENDOBRONCHIAL ULTRASOUND N/A 07/15/2019   Procedure: ENDOBRONCHIAL ULTRASOUND;  Surgeon: Rigoberto Noel, MD;  Location: [place];  Service: Cardiopulmonary;  Laterality: N/A;   FINE NEEDLE ASPIRATION  07/15/2019   Procedure: FINE NEEDLE ASPIRATION (FNA) LINEAR;  Surgeon: Rigoberto Noel, MD;  Location: [place];  Service: Cardiopulmonary;;   FINGER ARTHROPLASTY Right 04/17/2013   Procedure: IMPLANT ARTHROPLASTY RIGHT INDEX;  Surgeon: Cammie Sickle., MD;  Location: [place];  Service: Orthopedics;  Laterality: Right;   LUMBAR LAMINECTOMY  2010   Dr Becky Sax   MELANOMA EXCISION  2003   L ear   THYROIDECTOMY  1971   cytology indefinite   TONSILLECTOMY     TOTAL KNEE ARTHROPLASTY  2004   left   VIDEO BRONCHOSCOPY N/A 07/15/2019   Procedure: VIDEO BRONCHOSCOPY WITHOUT FLUORO;  Surgeon: Rigoberto Noel, MD;  Location: [place];  Service: Cardiopulmonary;  Laterality: N/A;   WISDOM TOOTH EXTRACTION  FAMILY HISTORY: family history includes Alzheimer's disease in his father; Breast cancer in his maternal grandmother, mother, and sister; Cancer in his sister.  SOCIAL HISTORY:  reports that he quit smoking about 28 years ago. His smoking use included cigarettes. He has a 30.75 pack-year smoking history. He has never used smokeless tobacco. He reports current alcohol use. He reports that he does not use drugs.  ALLERGIES: Patient has no known allergies.  MEDICATIONS:  Current Outpatient Medications  Medication Sig Dispense Refill   benzonatate (TESSALON) 200 MG capsule Take 1 capsule (200 mg total) by mouth 2 (two) times daily as needed for cough. 20 capsule 0   Cholecalciferol (VITAMIN D3) 50 MCG (2000 UT) capsule Take 1 capsule (2,000 Units  total) by mouth daily. 100 capsule 3   finasteride (PROSCAR) 5 MG tablet Take 1 tablet (5 mg total) by mouth daily. (Patient not taking: Reported on 11/01/2021) 90 tablet 3   guaiFENesin (MUCINEX) 600 MG 12 hr tablet Take 1 tablet (600 mg total) by mouth 2 (two) times daily. 30 tablet 0   levothyroxine (SYNTHROID) 200 MCG tablet Take 200 mcg by mouth daily.     levothyroxine (SYNTHROID) 25 MCG tablet Take 25 mcg by mouth daily. Take with 200 mcg tablet to total 225 mcg dose by Dr. Hartford Poli     mupirocin ointment (BACTROBAN) 2 % Apply 1 application  topically daily.     Polyethylene Glycol 3350 (MIRALAX PO) Take by mouth.     polyvinyl alcohol (LIQUIFILM TEARS) 1.4 % ophthalmic solution Place 1 drop into both eyes as needed for dry eyes.     sennosides-docusate sodium (SENOKOT-S) 8.6-50 MG tablet Take 1 tablet by mouth daily. 100 tablet 3   tamsulosin (FLOMAX) 0.4 MG CAPS capsule 1 po qhs 90 capsule 3   traMADol (ULTRAM) 50 MG tablet Take 1 tablet (50 mg total) by mouth 2 (two) times daily as needed for severe pain or moderate pain. 60 tablet 2   vismodegib (ERIVEDGE) 150 MG capsule Take 1 capsule (150 mg total) by mouth daily. 30 capsule 3   warfarin (COUMADIN) 5 MG tablet Take 5 mg on  Tue, Thur, Sat, Sun. Take 2.5 mg on Mon, Wed, Fri 100 tablet 3   No current facility-administered medications for this encounter.    REVIEW OF SYSTEMS:  Notable for that above.   PHYSICAL EXAM:  vitals were not taken for this visit.   General: Alert and oriented, in no acute distress *** HEENT: Head is normocephalic. Extraocular movements are intact. Oropharynx is clear. Neck: Neck is supple, no palpable cervical or supraclavicular lymphadenopathy. Heart: Regular in rate and rhythm with no murmurs, rubs, or gallops. Chest: Clear to auscultation bilaterally, with no rhonchi, wheezes, or rales. Abdomen: Soft, nontender, nondistended, with no rigidity or guarding. Extremities: No cyanosis or edema. Lymphatics: see  Neck Exam Skin: No concerning lesions. Musculoskeletal: symmetric strength and muscle tone throughout. Neurologic: Cranial nerves II through XII are grossly intact. No obvious focalities. Speech is fluent. Coordination is intact. Psychiatric: Judgment and insight are intact. Affect is appropriate.   ECOG = ***  0 - Asymptomatic (Fully active, able to carry on all predisease activities without restriction)  1 - Symptomatic but completely ambulatory (Restricted in physically strenuous activity but ambulatory and able to carry out work of a light or sedentary nature. For example, light housework, office work)  2 - Symptomatic, <50% in bed during the day (Ambulatory and capable of all self care but unable to carry out  any work activities. Up and about more than 50% of waking hours)  3 - Symptomatic, >50% in bed, but not bedbound (Capable of only limited self-care, confined to bed or chair 50% or more of waking hours)  4 - Bedbound (Completely disabled. Cannot carry on any self-care. Totally confined to bed or chair)  5 - Death   Eustace Pen MM, Creech RH, Tormey DC, et al. [phone]). "Toxicity and response criteria of the [facility]". [place] Oncol. 5 (6): 649-55   LABORATORY DATA:  Lab Results  Component Value Date   WBC 5.4 11/01/2021   HGB 13.3 11/01/2021   HCT 43.1 11/01/2021   MCV 86.9 11/01/2021   PLT 198 11/01/2021   CMP     Component Value Date/Time   NA 139 11/01/2021 0901   NA 142 11/29/2020 0000   K 5.0 11/01/2021 0901   CL 103 11/01/2021 0901   CO2 31 11/01/2021 0901   GLUCOSE 94 11/01/2021 0901   BUN 16 11/01/2021 0901   BUN 19 11/29/2020 0000   CREATININE 1.06 11/01/2021 0901   CALCIUM 9.7 11/01/2021 0901   PROT 7.4 11/01/2021 0901   ALBUMIN 3.9 11/01/2021 0901   AST 21 11/01/2021 0901   ALT 17 11/01/2021 0901   ALKPHOS 95 11/01/2021 0901   BILITOT 0.9 11/01/2021 0901   GFRNONAA >60 11/01/2021 0901   GFRAA >60 10/21/2019 1306          RADIOGRAPHY: CT Chest W Contrast  Result Date: 11/01/2021 CLINICAL DATA:  Metastatic thyroid cancer restaging * Tracking Code: BO * EXAM: CT CHEST WITH CONTRAST TECHNIQUE: Multidetector CT imaging of the chest was performed during intravenous contrast administration. RADIATION DOSE REDUCTION: This exam was performed according to the departmental dose-optimization program which includes automated exposure control, adjustment of the mA and/or kV according to patient size and/or use of iterative reconstruction technique. CONTRAST:  60m OMNIPAQUE IOHEXOL 300 MG/ML  SOLN COMPARISON:  07/13/2021 FINDINGS: Cardiovascular: Coronary, aortic arch, and branch vessel atherosclerotic vascular disease. Mediastinum/Nodes: Thyroidectomy. Small amounts of fluid in pericardial recesses. Lymph node or less likely pericardial recess fluid adjacent to the left mainstem bronchus, 1.1 cm in short axis on image 77 series 2, previously 0.8 cm. Lungs/Pleura: Similar overall pattern nodules and masses in the lungs. 3.9 by 3.3 cm right lower lobe mass on image 110 series 4, formerly 3.7 by 3.3 cm on 07/13/2021 and formerly 3.6 by 2.8 cm on 06/24/2020. Index left lower lobe mass 3.6 by 3.0 cm on image 127 series 4, formerly 3.6 by 2.9 cm. Index right middle lobe nodule 1.5 by 1.2 cm on image 112 series 4, formerly the same. Also similar appearance of tree-in-bud reticulonodular opacities in the lung bases, particularly favoring the left lower lobe and lingula, compatible with atypical infectious bronchiolitis. Mild airway thickening in both lower lobes. Upper Abdomen: Fluid density liver lesions appear unchanged. Fluid density lesions in proximity to the renal upper poles likewise only partially included but unchanged. These correspond to previously identified benign cysts on abdominal imaging and do not require further workup. Musculoskeletal: Lower cervical plate and screw fixator. Thoracic spondylosis. IMPRESSION: 1. Bilateral  pulmonary nodules and masses are stable when compared back to the most recent exam of 07/13/2021, although have mildly increased in size compared back to 06/24/2020. 2. Atypical infectious bronchiolitis, unchanged. 3. Airway thickening is present, suggesting bronchitis or reactive airways disease. 4. Renal and hepatic cysts. 5.  Aortic Atherosclerosis (ICD10-I70.0).  Coronary atherosclerosis. 6. Low-density lymph node or  small amount of pericardial recess fluid adjacent to the left mainstem bronchus, 1.1 cm in short axis, slightly enlarged from previous. Electronically Signed   By: Van Clines M.D.   On: 11/01/2021 10:33      IMPRESSION/PLAN:***    On date of service, in total, I spent *** minutes on this encounter. Patient was seen in person.   __________________________________________   Eppie Gibson, MD This document serves as a record of services personally performed by Eppie Gibson, MD. It was created on her behalf by Roney Mans, a trained medical scribe. The creation of this record is based on the scribe's personal observations and the provider's statements to them. This document has been checked and approved by the attending provider.

## 2021-11-14 ENCOUNTER — Encounter: Payer: Self-pay | Admitting: Radiation Oncology

## 2021-11-14 ENCOUNTER — Other Ambulatory Visit: Payer: Self-pay

## 2021-11-14 ENCOUNTER — Ambulatory Visit: Payer: Medicare Other | Admitting: Radiation Oncology

## 2021-11-14 ENCOUNTER — Ambulatory Visit
Admission: RE | Admit: 2021-11-14 | Discharge: 2021-11-14 | Disposition: A | Discharge status: Home / Self Care | Payer: Medicare Other | Source: Ambulatory Visit | Attending: Radiation Oncology | Admitting: Radiation Oncology

## 2021-11-14 ENCOUNTER — Ambulatory Visit: Payer: Medicare Other

## 2021-11-14 VITALS — BP 128/73 | HR 67 | Temp 97.5°F | Resp 18 | Ht 75.0 in | Wt 204.0 lb

## 2021-11-14 DIAGNOSIS — M129 Arthropathy, unspecified: Secondary | ICD-10-CM | POA: Diagnosis not present

## 2021-11-14 DIAGNOSIS — I2510 Atherosclerotic heart disease of native coronary artery without angina pectoris: Secondary | ICD-10-CM | POA: Diagnosis not present

## 2021-11-14 DIAGNOSIS — I700 Atherosclerosis of aorta: Secondary | ICD-10-CM | POA: Insufficient documentation

## 2021-11-14 DIAGNOSIS — E039 Hypothyroidism, unspecified: Secondary | ICD-10-CM | POA: Diagnosis not present

## 2021-11-14 DIAGNOSIS — C441122 Basal cell carcinoma of skin of right lower eyelid, including canthus: Secondary | ICD-10-CM | POA: Insufficient documentation

## 2021-11-14 DIAGNOSIS — Z803 Family history of malignant neoplasm of breast: Secondary | ICD-10-CM | POA: Diagnosis not present

## 2021-11-14 DIAGNOSIS — Z85850 Personal history of malignant neoplasm of thyroid: Secondary | ICD-10-CM | POA: Insufficient documentation

## 2021-11-14 DIAGNOSIS — Z86711 Personal history of pulmonary embolism: Secondary | ICD-10-CM | POA: Diagnosis not present

## 2021-11-14 DIAGNOSIS — K219 Gastro-esophageal reflux disease without esophagitis: Secondary | ICD-10-CM | POA: Insufficient documentation

## 2021-11-14 DIAGNOSIS — Z79899 Other long term (current) drug therapy: Secondary | ICD-10-CM | POA: Diagnosis not present

## 2021-11-14 DIAGNOSIS — Z87891 Personal history of nicotine dependence: Secondary | ICD-10-CM | POA: Diagnosis not present

## 2021-11-14 NOTE — Progress Notes (Signed)
11/14/2021:  Patient states he is doing well.  Reports some dry eye, denies pain.  He reports he is ready to begin radiation therapy.  Histology and Location of Primary Skin Cancer:  Basal cell carcinoma of right inferior central infraorbital   Past/Anticipated interventions by patient's surgeon/dermatologist for current problematic lesion, if any:  06/02/2021 --Dr. Shela Nevin      Past skin cancers, if any:    History of Blistering sunburns, if any: Significant history of sun exposure    SAFETY ISSUES: Prior radiation? He has not received external beam radiation before Did have I-131 therapy on 08/25/2019 158.7 mCi of iodine (Dr. Francetta Found at [facility]) Pacemaker/ICD? No Possible current pregnancy? N/A Is the patient on methotrexate? No   Current Complaints / other details:

## 2021-11-15 ENCOUNTER — Other Ambulatory Visit (HOSPITAL_COMMUNITY): Payer: Self-pay

## 2021-11-15 ENCOUNTER — Ambulatory Visit (INDEPENDENT_AMBULATORY_CARE_PROVIDER_SITE_OTHER): Payer: Medicare Other

## 2021-11-15 DIAGNOSIS — Z7901 Long term (current) use of anticoagulants: Secondary | ICD-10-CM | POA: Diagnosis not present

## 2021-11-15 LAB — POCT INR: INR: 1.7 — AB (ref 2.0–3.0)

## 2021-11-15 NOTE — Progress Notes (Addendum)
Increase dose today to take 1 1/2 tablet and then increase dose tomorrow to take 1 1/2 tablets. Then change weekly dose to 1 tablet daily except take 1/2 tablet on Mondays and Fridays. Recheck in 2 weeks.   Pt has upcoming appointment with oncology to discuss potential radiation treatment plan. Pt states the oral chemotherapy option previously discussed is not completely off the table, but he would prefer radiation over chemotherapy.   Medical screening examination/treatment/procedure(s) were performed by non-physician practitioner and as supervising physician I was immediately available for consultation/collaboration.  I agree with above. Lew Dawes, MD

## 2021-11-15 NOTE — Patient Instructions (Signed)
Increase dose today to take 1 1/2 tablet and then increase dose tomorrow to take 1 1/2 tablets. Then change weekly dose to 1 tablet daily except take 1/2 tablet on Mondays and Fridays. Recheck in 2 weeks.

## 2021-11-16 ENCOUNTER — Encounter: Payer: Self-pay | Admitting: Radiation Oncology

## 2021-11-16 ENCOUNTER — Other Ambulatory Visit (HOSPITAL_COMMUNITY): Payer: Self-pay

## 2021-11-16 NOTE — Progress Notes (Signed)
Phone note:  I just spoke with pt's wife and left a VM for his daughter Clarence Hopkins. I let them know the team recommends he try Erivedge with the goal of shrinking the eyelid tumor and facilitating a successful surgery that's both curative and rendering a good functional outcome. I explained that Dr Sabino Dick is working on getting free samples but eventually he'll need to pay for the medication under the care of Dr. Marin Olp.    His wife knows Clarence Hopkins needs to follow up w/ Dr. Sabino Dick as scheduled in the next week and a half and that they may hear from him if samples are available before then.  -----------------------------------  Clarence Gibson, MD

## 2021-11-24 DIAGNOSIS — D485 Neoplasm of uncertain behavior of skin: Secondary | ICD-10-CM | POA: Diagnosis not present

## 2021-11-24 DIAGNOSIS — Z961 Presence of intraocular lens: Secondary | ICD-10-CM | POA: Diagnosis not present

## 2021-11-24 DIAGNOSIS — C44111 Basal cell carcinoma of skin of unspecified eyelid, including canthus: Secondary | ICD-10-CM | POA: Diagnosis not present

## 2021-11-24 DIAGNOSIS — H16211 Exposure keratoconjunctivitis, right eye: Secondary | ICD-10-CM | POA: Diagnosis not present

## 2021-11-24 DIAGNOSIS — H0270 Unspecified degenerative disorders of eyelid and periocular area: Secondary | ICD-10-CM | POA: Diagnosis not present

## 2021-11-24 DIAGNOSIS — H02532 Eyelid retraction right lower eyelid: Secondary | ICD-10-CM | POA: Diagnosis not present

## 2021-11-28 ENCOUNTER — Other Ambulatory Visit: Payer: Self-pay

## 2021-11-28 ENCOUNTER — Encounter: Payer: Self-pay | Admitting: Hematology & Oncology

## 2021-11-28 ENCOUNTER — Inpatient Hospital Stay: Payer: Medicare Other | Attending: Hematology & Oncology

## 2021-11-28 ENCOUNTER — Inpatient Hospital Stay (HOSPITAL_BASED_OUTPATIENT_CLINIC_OR_DEPARTMENT_OTHER): Payer: Medicare Other | Admitting: Hematology & Oncology

## 2021-11-28 VITALS — BP 149/79 | HR 60 | Temp 97.6°F | Resp 16 | Ht 75.0 in | Wt 199.0 lb

## 2021-11-28 DIAGNOSIS — C73 Malignant neoplasm of thyroid gland: Secondary | ICD-10-CM | POA: Diagnosis not present

## 2021-11-28 DIAGNOSIS — C44319 Basal cell carcinoma of skin of other parts of face: Secondary | ICD-10-CM | POA: Insufficient documentation

## 2021-11-28 DIAGNOSIS — Z85828 Personal history of other malignant neoplasm of skin: Secondary | ICD-10-CM

## 2021-11-28 DIAGNOSIS — Z86718 Personal history of other venous thrombosis and embolism: Secondary | ICD-10-CM

## 2021-11-28 LAB — CBC WITH DIFFERENTIAL (CANCER CENTER ONLY)
Abs Immature Granulocytes: 0.01 10*3/uL (ref 0.00–0.07)
Basophils Absolute: 0 10*3/uL (ref 0.0–0.1)
Basophils Relative: 1 %
Eosinophils Absolute: 0.2 10*3/uL (ref 0.0–0.5)
Eosinophils Relative: 4 %
HCT: 40.9 % (ref 39.0–52.0)
Hemoglobin: 12.6 g/dL — ABNORMAL LOW (ref 13.0–17.0)
Immature Granulocytes: 0 %
Lymphocytes Relative: 29 %
Lymphs Abs: 1.5 10*3/uL (ref 0.7–4.0)
MCH: 26.9 pg (ref 26.0–34.0)
MCHC: 30.8 g/dL (ref 30.0–36.0)
MCV: 87.2 fL (ref 80.0–100.0)
Monocytes Absolute: 0.5 10*3/uL (ref 0.1–1.0)
Monocytes Relative: 10 %
Neutro Abs: 3 10*3/uL (ref 1.7–7.7)
Neutrophils Relative %: 56 %
Platelet Count: 208 10*3/uL (ref 150–400)
RBC: 4.69 MIL/uL (ref 4.22–5.81)
RDW: 13.2 % (ref 11.5–15.5)
WBC Count: 5.3 10*3/uL (ref 4.0–10.5)
nRBC: 0 % (ref 0.0–0.2)

## 2021-11-28 LAB — CMP (CANCER CENTER ONLY)
ALT: 13 U/L (ref 0–44)
AST: 18 U/L (ref 15–41)
Albumin: 3.8 g/dL (ref 3.5–5.0)
Alkaline Phosphatase: 83 U/L (ref 38–126)
Anion gap: 5 (ref 5–15)
BUN: 16 mg/dL (ref 8–23)
CO2: 31 mmol/L (ref 22–32)
Calcium: 9.2 mg/dL (ref 8.9–10.3)
Chloride: 103 mmol/L (ref 98–111)
Creatinine: 1.16 mg/dL (ref 0.61–1.24)
GFR, Estimated: >60 mL/min (ref >60)
Glucose, Bld: 108 mg/dL — ABNORMAL HIGH (ref 70–99)
Potassium: 4.4 mmol/L (ref 3.5–5.1)
Sodium: 139 mmol/L (ref 135–145)
Total Bilirubin: 0.5 mg/dL (ref 0.3–1.2)
Total Protein: 6.8 g/dL (ref 6.5–8.1)

## 2021-11-28 LAB — LACTATE DEHYDROGENASE: LDH: 134 U/L (ref 98–192)

## 2021-11-28 LAB — VITAMIN B12: Vitamin B-12: 264 pg/mL (ref 180–914)

## 2021-11-28 NOTE — Progress Notes (Signed)
Hematology and Oncology Follow Up Visit  Clarence Hopkins [id number] [birth year] 88 y.o. 11/28/2021   Principle Diagnosis:  Metastatic papillary carcinoma of the thyroid Basal cell carcinoma under the right eye.  Current Therapy:   S/p I-131 therapy on 08/27/2019 Vismodegib 150 mg po q day -- start on 12/01/2021     Interim History:  Clarence Hopkins is back for follow-up.  He has still not started the vismodegib.  He had a nodal biopsy done by Dr. Leonard Schwartz of Ophthalmology.  Once we get another confirmation of the diagnosis, then he will start.  Dr. Leonard Schwartz was very good and be able to get Clarence Hopkins some free samples.  Otherwise, the vismodegib will cost $3000 a month.  The biopsy was done just 4 days ago.  Otherwise, there is really no change with Clarence Hopkins.  He has had no problems with the cough.  He still has some fatigue.  He has a little bit of unsteadiness.  This is always been a problem for him and I think more so because of his age.    He continues on his warfarin.  His appetite is doing okay.  He has had no nausea or vomiting.  He has had no change in bowel or bladder habits.  He has had no swollen lymph nodes.  Henrene Pastor does have a lot of skin issues.  A lot of this is actinic and solar keratosis from chronic sun damage.  He does see dermatology for this.  Currently, I was his performance status is probably ECOG 2.    Medications:  Current Outpatient Medications:    benzonatate (TESSALON) 200 MG capsule, Take 1 capsule (200 mg total) by mouth 2 (two) times daily as needed for cough. (Patient not taking: Reported on 11/14/2021), Disp: 20 capsule, Rfl: 0   Cholecalciferol (VITAMIN D3) 50 MCG (2000 UT) capsule, Take 1 capsule (2,000 Units total) by mouth daily., Disp: 100 capsule, Rfl: 3   levothyroxine (SYNTHROID) 200 MCG tablet, Take 200 mcg by mouth daily., Disp: , Rfl:    levothyroxine (SYNTHROID) 25 MCG tablet, Take 25 mcg by mouth daily. Take with 200 mcg tablet to total 225 mcg  dose by Dr. Hartford Poli, Disp: , Rfl:    mupirocin ointment (BACTROBAN) 2 %, Apply 1 application  topically daily., Disp: , Rfl:    Polyethylene Glycol 3350 (MIRALAX PO), Take by mouth., Disp: , Rfl:    sennosides-docusate sodium (SENOKOT-S) 8.6-50 MG tablet, Take 1 tablet by mouth daily. (Patient not taking: Reported on 11/14/2021), Disp: 100 tablet, Rfl: 3   tamsulosin (FLOMAX) 0.4 MG CAPS capsule, 1 po qhs, Disp: 90 capsule, Rfl: 3   warfarin (COUMADIN) 5 MG tablet, Take 5 mg on  Tue, Thur, Sat, Sun. Take 2.5 mg on Mon, Wed, Fri, Disp: 100 tablet, Rfl: 3  Allergies: No Known Allergies  Past Medical History, Surgical history, Social history, and Family History were reviewed and updated.  Review of Systems: Review of Systems  Constitutional: Negative.   HENT:  Negative.    Eyes: Negative.   Respiratory: Negative.    Cardiovascular: Negative.   Gastrointestinal: Negative.   Endocrine: Negative.   Genitourinary: Negative.    Musculoskeletal: Negative.   Skin: Negative.   Neurological: Negative.   Hematological: Negative.   Psychiatric/Behavioral: Negative.      Physical Exam:  height is 6\' 3"  (1.905 m) and weight is 199 lb (90.3 kg). His oral temperature is 97.6 F (36.4 C). His blood pressure is 149/79 (abnormal) and his  pulse is 60. His respiration is 16 and oxygen saturation is 100%.   Wt Readings from Last 3 Encounters:  11/28/21 199 lb (90.3 kg)  11/14/21 204 lb (92.5 kg)  11/01/21 199 lb 8 oz (90.5 kg)    Physical Exam Vitals reviewed.  HENT:     Head: Normocephalic and atraumatic.     Comments: Under the right eye is a nodule.  This probably measures about 5 mm x 2 mm.  It is firm and not mobile. Eyes:     Pupils: Pupils are equal, round, and reactive to light.  Cardiovascular:     Heart sounds: Normal heart sounds.     Comments: Cardiac exam is slow but regular.  He does have occasional extra beats.  He does have an occasional skipped beat.  He has 1/6 systolic ejection  murmur. Pulmonary:     Effort: Pulmonary effort is normal.     Breath sounds: Normal breath sounds.  Abdominal:     General: Bowel sounds are normal.     Palpations: Abdomen is soft.  Musculoskeletal:        General: No tenderness or deformity. Normal range of motion.     Cervical back: Normal range of motion.  Lymphadenopathy:     Cervical: No cervical adenopathy.  Skin:    General: Skin is warm and dry.     Findings: No erythema or rash.  Neurological:     Mental Status: He is alert and oriented to person, place, and time.  Psychiatric:        Behavior: Behavior normal.        Thought Content: Thought content normal.        Judgment: Judgment normal.      Lab Results  Component Value Date   WBC 5.3 11/28/2021   HGB 12.6 (L) 11/28/2021   HCT 40.9 11/28/2021   MCV 87.2 11/28/2021   PLT 208 11/28/2021     Chemistry      Component Value Date/Time   NA 139 11/28/2021 1539   NA 142 11/29/2020 0000   K 4.4 11/28/2021 1539   CL 103 11/28/2021 1539   CO2 31 11/28/2021 1539   BUN 16 11/28/2021 1539   BUN 19 11/29/2020 0000   CREATININE 1.16 11/28/2021 1539   GLU 95 11/29/2020 0000      Component Value Date/Time   CALCIUM 9.2 11/28/2021 1539   ALKPHOS 83 11/28/2021 1539   AST 18 11/28/2021 1539   ALT 13 11/28/2021 1539   BILITOT 0.5 11/28/2021 1539      Impression and Plan: Clarence Hopkins is a very nice 88 year old white male.  He has metastatic thyroid cancer.  This is a differentiated thyroid cancer.  Thankfully it is responsive to the radioactive iodine.  He is still responding.  He sees Dr. Hartford Poli for treatment of the thyroid cancer.  He has a basal cell carcinoma under the right eye.  I realize it has been incredibly difficult to get the vismodegib for him.  I know everybody has been trying really hard to get this for him.  I am so glad that his ophthalmologist was able to help out.  We will see what we can do to try him some more samples.  Thinking that he will start  treatment this week, I will plan to get him back in about 3 weeks or so and hopefully, he will not be having any problems with side effects.    I told him to give [place]  a call if he does have any issues with the vismodegib.      [name], MD 10/16/20235:52 PM

## 2021-11-29 ENCOUNTER — Ambulatory Visit (INDEPENDENT_AMBULATORY_CARE_PROVIDER_SITE_OTHER): Payer: Medicare Other

## 2021-11-29 DIAGNOSIS — Z7901 Long term (current) use of anticoagulants: Secondary | ICD-10-CM

## 2021-11-29 LAB — POCT INR: INR: 2.2 (ref 2.0–3.0)

## 2021-11-29 NOTE — Progress Notes (Addendum)
Continue to take 1 tablet daily except take 1/2 tablet on Mondays and Fridays. Recheck in 4 weeks.   Pt is waiting for nodal biopsy results from right eye. Will hold off on starting vismodegib oral chemotherapy until after he receives those results.    Medical screening examination/treatment/procedure(s) were performed by non-physician practitioner and as supervising physician I was immediately available for consultation/collaboration.  I agree with above. Lew Dawes, MD

## 2021-11-29 NOTE — Patient Instructions (Signed)
Continue to take 1 tablet daily except take 1/2 tablet on Mondays and Fridays. Recheck in 4 weeks.

## 2021-12-01 DIAGNOSIS — D485 Neoplasm of uncertain behavior of skin: Secondary | ICD-10-CM | POA: Diagnosis not present

## 2021-12-01 DIAGNOSIS — C44329 Squamous cell carcinoma of skin of other parts of face: Secondary | ICD-10-CM | POA: Diagnosis not present

## 2021-12-01 DIAGNOSIS — C44719 Basal cell carcinoma of skin of left lower limb, including hip: Secondary | ICD-10-CM | POA: Diagnosis not present

## 2021-12-01 DIAGNOSIS — Z85820 Personal history of malignant melanoma of skin: Secondary | ICD-10-CM | POA: Diagnosis not present

## 2021-12-01 DIAGNOSIS — L820 Inflamed seborrheic keratosis: Secondary | ICD-10-CM | POA: Diagnosis not present

## 2021-12-01 DIAGNOSIS — Z85828 Personal history of other malignant neoplasm of skin: Secondary | ICD-10-CM | POA: Diagnosis not present

## 2021-12-21 DIAGNOSIS — C799 Secondary malignant neoplasm of unspecified site: Secondary | ICD-10-CM | POA: Diagnosis not present

## 2021-12-21 DIAGNOSIS — E890 Postprocedural hypothyroidism: Secondary | ICD-10-CM | POA: Diagnosis not present

## 2021-12-21 DIAGNOSIS — C73 Malignant neoplasm of thyroid gland: Secondary | ICD-10-CM | POA: Diagnosis not present

## 2021-12-27 ENCOUNTER — Ambulatory Visit (INDEPENDENT_AMBULATORY_CARE_PROVIDER_SITE_OTHER): Payer: Medicare Other

## 2021-12-27 DIAGNOSIS — Z7901 Long term (current) use of anticoagulants: Secondary | ICD-10-CM | POA: Diagnosis not present

## 2021-12-27 LAB — POCT INR: INR: 3.4 — AB (ref 2.0–3.0)

## 2021-12-27 NOTE — Patient Instructions (Signed)
Hold tomorrow's dose, then continue to take 1 tablet daily except take 1/2 tablet on Mondays and Fridays. Recheck on 01/17/22 at 3:30.

## 2021-12-27 NOTE — Progress Notes (Cosign Needed Addendum)
Pt reports already taking today's dose.  Hold tomorrow's dose, then continue to take 1 tablet daily except take 1/2 tablet on Mondays and Fridays. Recheck in 3 weeks.   Pt reports that he began taking vismodegib oral chemotherapy approximately 20 days ago for basal cell carcinoma under right eye.  Medical screening examination/treatment/procedure(s) were performed by non-physician practitioner and as supervising physician I was immediately available for consultation/collaboration.  I agree with above. Lew Dawes, MD

## 2021-12-28 ENCOUNTER — Ambulatory Visit (INDEPENDENT_AMBULATORY_CARE_PROVIDER_SITE_OTHER): Payer: Medicare Other

## 2021-12-28 VITALS — Ht 75.0 in | Wt 198.0 lb

## 2021-12-28 DIAGNOSIS — Z0000 Encounter for general adult medical examination without abnormal findings: Secondary | ICD-10-CM | POA: Diagnosis not present

## 2021-12-28 NOTE — Patient Instructions (Signed)
Clarence Hopkins , Thank you for taking time to come for your Medicare Wellness Visit. I appreciate your ongoing commitment to your health goals. Please review the following plan we discussed and let me know if I can assist you in the future.   These are the goals we discussed:  Goals      Client understands the importance of follow-up with providers by attending scheduled visits     My goal is to maintain my health.        This is a list of the screening recommended for you and due dates:  Health Maintenance  Topic Date Due   Tetanus Vaccine  Never done   Zoster (Shingles) Vaccine (1 of 2) Never done   COVID-19 Vaccine (5 - Pfizer risk series) 08/30/2020   Flu Shot  09/13/2021   Medicare Annual Wellness Visit  12/29/2022   Pneumonia Vaccine  Completed   HPV Vaccine  Aged Out    Advanced directives: Yes  Conditions/risks identified: Yes  Next appointment: Follow up in one year for your annual wellness visit.   Preventive Care 55 Years and Older, Male  Preventive care refers to lifestyle choices and visits with your health care provider that can promote health and wellness. What does preventive care include? A yearly physical exam. This is also called an annual well check. Dental exams once or twice a year. Routine eye exams. Ask your health care provider how often you should have your eyes checked. Personal lifestyle choices, including: Daily care of your teeth and gums. Regular physical activity. Eating a healthy diet. Avoiding tobacco and drug use. Limiting alcohol use. Practicing safe sex. Taking low doses of aspirin every day. Taking vitamin and mineral supplements as recommended by your health care provider. What happens during an annual well check? The services and screenings done by your health care provider during your annual well check will depend on your age, overall health, lifestyle risk factors, and family history of disease. Counseling  Your health care  provider may ask you questions about your: Alcohol use. Tobacco use. Drug use. Emotional well-being. Home and relationship well-being. Sexual activity. Eating habits. History of falls. Memory and ability to understand (cognition). Work and work Statistician. Screening  You may have the following tests or measurements: Height, weight, and BMI. Blood pressure. Lipid and cholesterol levels. These may be checked every 5 years, or more frequently if you are over 31 years old. Skin check. Lung cancer screening. You may have this screening every year starting at age 50 if you have a 30-pack-year history of smoking and currently smoke or have quit within the past 15 years. Fecal occult blood test (FOBT) of the stool. You may have this test every year starting at age 86. Flexible sigmoidoscopy or colonoscopy. You may have a sigmoidoscopy every 5 years or a colonoscopy every 10 years starting at age 86. Prostate cancer screening. Recommendations will vary depending on your family history and other risks. Hepatitis C blood test. Hepatitis B blood test. Sexually transmitted disease (STD) testing. Diabetes screening. This is done by checking your blood sugar (glucose) after you have not eaten for a while (fasting). You may have this done every 1-3 years. Abdominal aortic aneurysm (AAA) screening. You may need this if you are a current or former smoker. Osteoporosis. You may be screened starting at age 86 if you are at high risk. Talk with your health care provider about your test results, treatment options, and if necessary, the need for more  tests. Vaccines  Your health care provider may recommend certain vaccines, such as: Influenza vaccine. This is recommended every year. Tetanus, diphtheria, and acellular pertussis (Tdap, Td) vaccine. You may need a Td booster every 10 years. Zoster vaccine. You may need this after age 86. Pneumococcal 13-valent conjugate (PCV13) vaccine. One dose is  recommended after age 86. Pneumococcal polysaccharide (PPSV23) vaccine. One dose is recommended after age 86. Talk to your health care provider about which screenings and vaccines you need and how often you need them. This information is not intended to replace advice given to you by your health care provider. Make sure you discuss any questions you have with your health care provider. Document Released: 02/26/2015 Document Revised: 10/20/2015 Document Reviewed: 12/01/2014 Elsevier Interactive Patient Education  2017 [place] Prevention in the Home Falls can cause injuries. They can happen to people of all ages. There are many things you can do to make your home safe and to help prevent falls. What can I do on the outside of my home? Regularly fix the edges of walkways and driveways and fix any cracks. Remove anything that might make you trip as you walk through a door, such as a raised step or threshold. Trim any bushes or trees on the path to your home. Use bright outdoor lighting. Clear any walking paths of anything that might make someone trip, such as rocks or tools. Regularly check to see if handrails are loose or broken. Make sure that both sides of any steps have handrails. Any raised decks and porches should have guardrails on the edges. Have any leaves, snow, or ice cleared regularly. Use sand or salt on walking paths during winter. Clean up any spills in your garage right away. This includes oil or grease spills. What can I do in the bathroom? Use night lights. Install grab bars by the toilet and in the tub and shower. Do not use towel bars as grab bars. Use non-skid mats or decals in the tub or shower. If you need to sit down in the shower, use a plastic, non-slip stool. Keep the floor dry. Clean up any water that spills on the floor as soon as it happens. Remove soap buildup in the tub or shower regularly. Attach bath mats securely with double-sided non-slip rug  tape. Do not have throw rugs and other things on the floor that can make you trip. What can I do in the bedroom? Use night lights. Make sure that you have a light by your bed that is easy to reach. Do not use any sheets or blankets that are too big for your bed. They should not hang down onto the floor. Have a firm chair that has side arms. You can use this for support while you get dressed. Do not have throw rugs and other things on the floor that can make you trip. What can I do in the kitchen? Clean up any spills right away. Avoid walking on wet floors. Keep items that you use a lot in easy-to-reach places. If you need to reach something above you, use a strong step stool that has a grab bar. Keep electrical cords out of the way. Do not use floor polish or wax that makes floors slippery. If you must use wax, use non-skid floor wax. Do not have throw rugs and other things on the floor that can make you trip. What can I do with my stairs? Do not leave any items on the stairs. Make sure  that there are handrails on both sides of the stairs and use them. Fix handrails that are broken or loose. Make sure that handrails are as long as the stairways. Check any carpeting to make sure that it is firmly attached to the stairs. Fix any carpet that is loose or worn. Avoid having throw rugs at the top or bottom of the stairs. If you do have throw rugs, attach them to the floor with carpet tape. Make sure that you have a light switch at the top of the stairs and the bottom of the stairs. If you do not have them, ask someone to add them for you. What else can I do to help prevent falls? Wear shoes that: Do not have high heels. Have rubber bottoms. Are comfortable and fit you well. Are closed at the toe. Do not wear sandals. If you use a stepladder: Make sure that it is fully opened. Do not climb a closed stepladder. Make sure that both sides of the stepladder are locked into place. Ask someone to  hold it for you, if possible. Clearly mark and make sure that you can see: Any grab bars or handrails. First and last steps. Where the edge of each step is. Use tools that help you move around (mobility aids) if they are needed. These include: Canes. Walkers. Scooters. Crutches. Turn on the lights when you go into a dark area. Replace any light bulbs as soon as they burn out. Set up your furniture so you have a clear path. Avoid moving your furniture around. If any of your floors are uneven, fix them. If there are any pets around you, be aware of where they are. Review your medicines with your doctor. Some medicines can make you feel dizzy. This can increase your chance of falling. Ask your doctor what other things that you can do to help prevent falls. This information is not intended to replace advice given to you by your health care provider. Make sure you discuss any questions you have with your health care provider. Document Released: 11/26/2008 Document Revised: 07/08/2015 Document Reviewed: 03/06/2014 Elsevier Interactive Patient Education  2017 [employer].

## 2021-12-28 NOTE — Progress Notes (Cosign Needed Addendum)
Virtual Visit via Telephone Note  I connected with  Clarence Hopkins on 12/28/21 at  3:15 PM EST by telephone and verified that I am speaking with the correct person using two identifiers.  Location: Patient: Home Provider: [place] Persons participating in the virtual visit: Clarence Hopkins   I discussed the limitations, risks, security and privacy concerns of performing an evaluation and management service by telephone and the availability of in person appointments. The patient expressed understanding and agreed to proceed.  Interactive audio and video telecommunications were attempted between this nurse and patient, however failed, due to patient having technical difficulties OR patient did not have access to video capability.  We continued and completed visit with audio only.  Some vital signs may be absent or patient reported.   Sheral Flow, LPN  Subjective:   Clarence Hopkins is a 88 y.o. male who presents for Medicare Annual/Subsequent preventive examination.  Review of Systems     Cardiac Risk Factors include: advanced age (>26men, >13 women);male gender;sedentary lifestyle     Objective:    Today's Vitals   12/28/21 1517  Weight: 198 lb (89.8 kg)  Height: 6\' 3"  (1.905 m)  PainSc: 0-No pain   Body mass index is 24.75 kg/m.     12/28/2021    3:28 PM 11/28/2021    4:15 PM 11/14/2021   10:27 AM 11/01/2021   11:25 AM 09/26/2021    1:48 PM 08/30/2021    3:27 PM 07/13/2021   10:28 AM  Advanced Directives  Does Patient Have a Medical Advance Directive? Yes Yes Yes Yes Yes Yes Yes  Type of Paramedic of [place];Living will [place];Living will [place];Living will [place];Living will Living will;Healthcare Power of Attorney Living will;Healthcare Power of [place];Living will  Does patient want to make changes to medical advance  directive?  No - Patient declined No - Patient declined No - Patient declined  No - Patient declined No - Patient declined  Copy of [place] in Chart? No - copy requested   No - copy requested   No - copy requested  Would patient like information on creating a medical advance directive?   No - Patient declined No - Patient declined   No - Patient declined    Current Medications (verified) Outpatient Encounter Medications as of 12/28/2021  Medication Sig   benzonatate (TESSALON) 200 MG capsule Take 1 capsule (200 mg total) by mouth 2 (two) times daily as needed for cough. (Patient not taking: Reported on 11/14/2021)   Cholecalciferol (VITAMIN D3) 50 MCG (2000 UT) capsule Take 1 capsule (2,000 Units total) by mouth daily.   levothyroxine (SYNTHROID) 200 MCG tablet Take 200 mcg by mouth daily.   levothyroxine (SYNTHROID) 25 MCG tablet Take 25 mcg by mouth daily. Take with 200 mcg tablet to total 225 mcg dose by Dr. Hartford Poli   mupirocin ointment (BACTROBAN) 2 % Apply 1 application  topically daily.   Polyethylene Glycol 3350 (MIRALAX PO) Take by mouth.   sennosides-docusate sodium (SENOKOT-S) 8.6-50 MG tablet Take 1 tablet by mouth daily. (Patient not taking: Reported on 11/14/2021)   tamsulosin (FLOMAX) 0.4 MG CAPS capsule 1 po qhs   warfarin (COUMADIN) 5 MG tablet Take 5 mg on  Tue, Thur, Sat, Sun. Take 2.5 mg on Mon, Wed, Fri   No facility-administered encounter medications on file as of 12/28/2021.    Allergies (verified) Patient  has no known allergies.   History: Past Medical History:  Diagnosis Date   Arthritis    Cancer ([place]) 2003   melanoma  L ear   GERD (gastroesophageal reflux disease)    Hypothyroidism    post op   Lung mass    Pulmonary embolism ([place]) 2005 & 2007   protein C deficiency   Past Surgical History:  Procedure Laterality Date   BRONCHIAL BRUSHINGS  07/15/2019   Procedure: BRONCHIAL BRUSHINGS;  Surgeon: Rigoberto Noel, MD;  Location: [place];   Service: Cardiopulmonary;;   BRONCHIAL WASHINGS  07/15/2019   Procedure: BRONCHIAL WASHINGS;  Surgeon: Rigoberto Noel, MD;  Location: [facility];  Service: Cardiopulmonary;;   CARDIAC CATHETERIZATION     > 20 years ago " everything was okay"    CERVICAL FUSION      X 2; Dr Vertell Limber   COLONOSCOPY     ENDOBRONCHIAL ULTRASOUND N/A 07/15/2019   Procedure: ENDOBRONCHIAL ULTRASOUND;  Surgeon: Rigoberto Noel, MD;  Location: [place];  Service: Cardiopulmonary;  Laterality: N/A;   FINE NEEDLE ASPIRATION  07/15/2019   Procedure: FINE NEEDLE ASPIRATION (FNA) LINEAR;  Surgeon: Rigoberto Noel, MD;  Location: [place];  Service: Cardiopulmonary;;   FINGER ARTHROPLASTY Right 04/17/2013   Procedure: IMPLANT ARTHROPLASTY RIGHT INDEX;  Surgeon: Cammie Sickle., MD;  Location: [place];  Service: Orthopedics;  Laterality: Right;   LUMBAR LAMINECTOMY  2010   Dr Becky Sax   MELANOMA EXCISION  2003   L ear   THYROIDECTOMY  1971   cytology indefinite   TONSILLECTOMY     TOTAL KNEE ARTHROPLASTY  2004   left   VIDEO BRONCHOSCOPY N/A 07/15/2019   Procedure: VIDEO BRONCHOSCOPY WITHOUT FLUORO;  Surgeon: Rigoberto Noel, MD;  Location: [place];  Service: Cardiopulmonary;  Laterality: N/A;   WISDOM TOOTH EXTRACTION     Family History  Problem Relation Age of Onset   Breast cancer Mother    Alzheimer's disease Father    Breast cancer Maternal Grandmother    Breast cancer Sister    Cancer Sister    Diabetes Neg Hx    Stroke Neg Hx    Heart disease Neg Hx    Social History   Socioeconomic History   Marital status: Married    Spouse name: Not on file   Number of children: 4   Years of education: 15   Highest education level: Not on file  Occupational History   Not on file  Tobacco Use   Smoking status: Former    Packs/day: 0.75    Years: 41.00    Total pack years: 30.75    Types: Cigarettes    Quit date: 02/13/1993    Years since quitting: 28.8   Smokeless tobacco: Never    Tobacco comments:    smoked 1956-1995, up to 1/2 ppd  Vaping Use   Vaping Use: Never used  Substance and Sexual Activity   Alcohol use: Yes    Comment:  Vodka 2 oz/ night   Drug use: No   Sexual activity: Not Currently  Other Topics Concern   Not on file  Social History Narrative   Fun: Plays golf   Denies abuse and feels safe at home   Social Determinants of Health   Financial Resource Strain: Low Risk  (12/28/2021)   Overall Financial Resource Strain (CARDIA)    Difficulty of Paying Living Expenses: Not hard at all  Food Insecurity: No Food Insecurity (12/28/2021)  Hunger Vital Sign    Worried About Running Out of Food in the Last Year: Never true    Ran Out of Food in the Last Year: Never true  Transportation Needs: No Transportation Needs (12/28/2021)   PRAPARE - Hydrologist (Medical): No    Lack of Transportation (Non-Medical): No  Physical Activity: Inactive (12/28/2021)   Exercise Vital Sign    Days of Exercise per Week: 0 days    Minutes of Exercise per Session: 0 min  Stress: No Stress Concern Present (12/28/2021)   [place]    Feeling of Stress : Not at all  Social Connections: [place] (12/28/2021)   Social Connection and Isolation Panel [NHANES]    Frequency of Communication with Friends and Family: More than three times a week    Frequency of Social Gatherings with Friends and Family: More than three times a week    Attends Religious Services: More than 4 times per year    Active Member of [employer] or Organizations: Yes    Attends Music therapist: More than 4 times per year    Marital Status: Married    Tobacco Counseling Counseling given: Not Answered Tobacco comments: smoked 1956-1995, up to 1/2 ppd   Clinical Intake:  Pre-visit preparation completed: Yes  Pain : No/denies pain Pain Score: 0-No pain     BMI - recorded:  24.75 Nutritional Status: BMI of 19-24  Normal Nutritional Risks: None Diabetes: No  How often do you need to have someone help you when you read instructions, pamphlets, or other written materials from your doctor or pharmacy?: 1 - Never What is the last grade level you completed in school?: HSG  Diabetic? no  Interpreter Needed?: No  Information entered by :: Lisette Abu, LPN.   Activities of Daily Living    12/28/2021    3:29 PM  In your present state of health, do you have any difficulty performing the following activities:  Hearing? 0  Vision? 0  Difficulty concentrating or making decisions? 0  Walking or climbing stairs? 1  Comment cane, walker  Dressing or bathing? 0  Doing errands, shopping? 0  Preparing Food and eating ? N  Using the Toilet? N  In the past six months, have you accidently leaked urine? N  Do you have problems with loss of bowel control? N  Managing your Medications? N  Managing your Finances? N  Housekeeping or managing your Housekeeping? N    Patient Care Team: Plotnikov, Evie Lacks, MD as PCP - General (Internal Medicine) Shon Hough, MD as Consulting Physician (Ophthalmology) [name], MD as Consulting Physician (Dermatology) Oneita Kras, MD as Referring Physician (Endocrinology) [name], MD as Medical Oncologist (Oncology) Szabat, Darnelle Maffucci, [facility] (Inactive) (Pharmacist) Tomasa Blase, [facility] (Inactive) as Pharmacist (Pharmacist) Eppie Gibson, MD as Consulting Physician (Radiation Oncology) Malmfelt, Stephani Police, RN as Oncology Nurse Navigator Ernestina Columbia, MD as Consulting Physician (Ophthalmology) Plotnikov, Evie Lacks, MD as Consulting Physician (Internal Medicine) Opthamology, [facility] as Consulting Physician (Ophthalmology)  Indicate any recent Medical Services you may have received from other than [facility] providers in the past year (date may be approximate).     Assessment:   This is a routine  wellness examination for [place].  Hearing/Vision screen Hearing Screening - Comments:: Denies hearing difficulties   Vision Screening - Comments:: Up to date with routine eye exams with [facility]   Dietary issues  and exercise activities discussed: Current Exercise Habits: The patient does not participate in regular exercise at present, Exercise limited by: Other - see comments   Goals Addressed             This Visit's Progress    Client understands the importance of follow-up with providers by attending scheduled visits       My goal is to maintain my health.      Depression Screen    12/28/2021    3:27 PM 12/27/2020    3:12 PM 08/19/2019    2:24 PM 03/10/2019    1:04 PM 03/07/2018    2:07 PM 03/05/2017    3:38 PM 02/04/2016    9:45 AM  PHQ 2/9 Scores  PHQ - 2 Score 0 0 0 0 0 0 0  PHQ- 9 Score      3     Fall Risk    12/28/2021    3:29 PM 12/27/2020    3:07 PM 08/19/2019    2:24 PM 03/10/2019    1:05 PM 03/07/2018    2:09 PM  [place] in the past year? 0 0 0 0 0  Number falls in past yr: 0 0 0 0   Injury with Fall? 0 0 0 0   Risk for fall due to : No Fall Risks No Fall Risks No Fall Risks    Follow up Falls prevention discussed Falls evaluation completed Falls evaluation completed  Falls evaluation completed    [place]:  Any stairs in or around the home? No  If so, are there any without handrails? No  Home free of loose throw rugs in walkways, pet beds, electrical cords, etc? Yes  Adequate lighting in your home to reduce risk of falls? Yes   ASSISTIVE DEVICES UTILIZED TO PREVENT FALLS:  Life alert? Yes  Use of a cane, walker or w/c? Yes  Grab bars in the bathroom? Yes  Shower chair or bench in shower? Yes  Elevated toilet seat or a handicapped toilet? Yes   TIMED UP AND GO:  Was the test performed? No . Phone Visit  Cognitive Function:    03/05/2017    3:39 PM  MMSE - Mini Mental State Exam   Orientation to time 5  Orientation to Place 5  Registration 3  Attention/ Calculation 5  Recall 1  Language- name 2 objects 2  Language- repeat 1  Language- follow 3 step command 3  Language- read & follow direction 1  Write a sentence 1  Copy design 1  Total score 28        12/28/2021    3:30 PM  6CIT Screen  What Year? 0 points  What month? 0 points  What time? 0 points  Count back from 20 0 points  Months in reverse 0 points  Repeat phrase 0 points  Total Score 0 points    Immunizations Immunization History  Administered Date(s) Administered   Influenza Split 11/13/2012   Influenza Whole 11/13/2009, 11/14/2011   Influenza, High Dose Seasonal PF 11/23/2015, 11/27/2016, 11/27/2017, 11/14/2018   Influenza-Unspecified 12/08/2013, 12/07/2014   PFIZER Comirnaty(Gray Top)Covid-19 Tri-Sucrose Vaccine 07/05/2020   PFIZER(Purple Top)SARS-COV-2 Vaccination 03/02/2019, 03/19/2019, 10/23/2019   Pneumococcal Conjugate-13 03/05/2017   Pneumococcal Polysaccharide-23 02/04/2016   Zoster, Live 03/14/2006    TDAP status: Due, Education has been provided regarding the importance of this vaccine. Advised may receive this vaccine at local pharmacy or Health Dept. Aware to  provide a copy of the vaccination record if obtained from local pharmacy or Health Dept. Verbalized acceptance and understanding.  Flu Vaccine status: Due, Education has been provided regarding the importance of this vaccine. Advised may receive this vaccine at local pharmacy or Health Dept. Aware to provide a copy of the vaccination record if obtained from local pharmacy or Health Dept. Verbalized acceptance and understanding.  Pneumococcal vaccine status: Up to date  Covid-19 vaccine status: Completed vaccines  Qualifies for Shingles Vaccine? Yes   Zostavax completed Yes   Shingrix Completed?: No.    Education has been provided regarding the importance of this vaccine. Patient has been advised to call insurance  company to determine out of pocket expense if they have not yet received this vaccine. Advised may also receive vaccine at local pharmacy or Health Dept. Verbalized acceptance and understanding.  Screening Tests Health Maintenance  Topic Date Due   TETANUS/TDAP  Never done   Zoster Vaccines- Shingrix (1 of 2) Never done   COVID-19 Vaccine (5 - Pfizer risk series) 08/30/2020   INFLUENZA VACCINE  09/13/2021   Medicare Annual Wellness (AWV)  12/29/2022   Pneumonia Vaccine 25+ Years old  Completed   HPV VACCINES  Aged Out    Health Maintenance  Health Maintenance Due  Topic Date Due   TETANUS/TDAP  Never done   Zoster Vaccines- Shingrix (1 of 2) Never done   COVID-19 Vaccine (5 - Pfizer risk series) 08/30/2020   INFLUENZA VACCINE  09/13/2021    Colorectal cancer screening: No longer required.   Lung Cancer Screening: (Low Dose CT Chest recommended if Age 79-80 years, 30 pack-year currently smoking OR have quit w/in 15years.) does not qualify.   Lung Cancer Screening Referral: no  Additional Screening:  Hepatitis C Screening: does not qualify; Completed no  Vision Screening: Recommended annual ophthalmology exams for early detection of glaucoma and other disorders of the eye. Is the patient up to date with their annual eye exam?  Yes  Who is the provider or what is the name of the office in which the patient attends annual eye exams? [facility] If pt is not established with a provider, would they like to be referred to a provider to establish care? No .   Dental Screening: Recommended annual dental exams for proper oral hygiene  Community Resource Referral / Chronic Care Management: CRR required this visit?  No   CCM required this visit?  No      Plan:     I have personally reviewed and noted the following in the patient's chart:   Medical and social history Use of alcohol, tobacco or illicit drugs  Current medications and supplements including opioid  prescriptions. Patient is not currently taking opioid prescriptions. Functional ability and status Nutritional status Physical activity Advanced directives List of other physicians Hospitalizations, surgeries, and ER visits in previous 12 months Vitals Screenings to include cognitive, depression, and falls Referrals and appointments  In addition, I have reviewed and discussed with patient certain preventive protocols, quality metrics, and best practice recommendations. A written personalized care plan for preventive services as well as general preventive health recommendations were provided to patient.     Sheral Flow, LPN   03/50/0938   Nurse Notes: N/A  Medical screening examination/treatment/procedure(s) were performed by non-physician practitioner and as supervising physician I was immediately available for consultation/collaboration.  I agree with above. Lew Dawes, MD

## 2021-12-29 DIAGNOSIS — C73 Malignant neoplasm of thyroid gland: Secondary | ICD-10-CM | POA: Diagnosis not present

## 2021-12-29 DIAGNOSIS — C799 Secondary malignant neoplasm of unspecified site: Secondary | ICD-10-CM | POA: Diagnosis not present

## 2021-12-29 DIAGNOSIS — E890 Postprocedural hypothyroidism: Secondary | ICD-10-CM | POA: Diagnosis not present

## 2021-12-30 ENCOUNTER — Inpatient Hospital Stay: Payer: Medicare Other | Attending: Hematology & Oncology

## 2021-12-30 ENCOUNTER — Inpatient Hospital Stay (HOSPITAL_BASED_OUTPATIENT_CLINIC_OR_DEPARTMENT_OTHER): Payer: Medicare Other | Admitting: Hematology & Oncology

## 2021-12-30 VITALS — HR 63 | Temp 98.0°F | Resp 16 | Ht 75.0 in | Wt 198.0 lb

## 2021-12-30 DIAGNOSIS — R5383 Other fatigue: Secondary | ICD-10-CM | POA: Insufficient documentation

## 2021-12-30 DIAGNOSIS — C73 Malignant neoplasm of thyroid gland: Secondary | ICD-10-CM | POA: Insufficient documentation

## 2021-12-30 DIAGNOSIS — C44319 Basal cell carcinoma of skin of other parts of face: Secondary | ICD-10-CM | POA: Insufficient documentation

## 2021-12-30 DIAGNOSIS — R531 Weakness: Secondary | ICD-10-CM | POA: Diagnosis not present

## 2021-12-30 DIAGNOSIS — Z85828 Personal history of other malignant neoplasm of skin: Secondary | ICD-10-CM

## 2021-12-30 LAB — CBC WITH DIFFERENTIAL (CANCER CENTER ONLY)
Abs Immature Granulocytes: 0.02 10*3/uL (ref 0.00–0.07)
Basophils Absolute: 0 10*3/uL (ref 0.0–0.1)
Basophils Relative: 1 %
Eosinophils Absolute: 0.2 10*3/uL (ref 0.0–0.5)
Eosinophils Relative: 3 %
HCT: 40.7 % (ref 39.0–52.0)
Hemoglobin: 12.5 g/dL — ABNORMAL LOW (ref 13.0–17.0)
Immature Granulocytes: 0 %
Lymphocytes Relative: 27 %
Lymphs Abs: 1.7 10*3/uL (ref 0.7–4.0)
MCH: 26.7 pg (ref 26.0–34.0)
MCHC: 30.7 g/dL (ref 30.0–36.0)
MCV: 86.8 fL (ref 80.0–100.0)
Monocytes Absolute: 0.6 10*3/uL (ref 0.1–1.0)
Monocytes Relative: 10 %
Neutro Abs: 3.6 10*3/uL (ref 1.7–7.7)
Neutrophils Relative %: 59 %
Platelet Count: 219 10*3/uL (ref 150–400)
RBC: 4.69 MIL/uL (ref 4.22–5.81)
RDW: 13.5 % (ref 11.5–15.5)
WBC Count: 6.1 10*3/uL (ref 4.0–10.5)
nRBC: 0 % (ref 0.0–0.2)

## 2021-12-30 LAB — CMP (CANCER CENTER ONLY)
ALT: 15 U/L (ref 0–44)
AST: 20 U/L (ref 15–41)
Albumin: 4 g/dL (ref 3.5–5.0)
Alkaline Phosphatase: 88 U/L (ref 38–126)
Anion gap: 7 (ref 5–15)
BUN: 19 mg/dL (ref 8–23)
CO2: 30 mmol/L (ref 22–32)
Calcium: 9.6 mg/dL (ref 8.9–10.3)
Chloride: 102 mmol/L (ref 98–111)
Creatinine: 1.13 mg/dL (ref 0.61–1.24)
GFR, Estimated: >60 mL/min (ref >60)
Glucose, Bld: 109 mg/dL — ABNORMAL HIGH (ref 70–99)
Potassium: 4.6 mmol/L (ref 3.5–5.1)
Sodium: 139 mmol/L (ref 135–145)
Total Bilirubin: 0.6 mg/dL (ref 0.3–1.2)
Total Protein: 7.2 g/dL (ref 6.5–8.1)

## 2021-12-30 LAB — IRON AND IRON BINDING CAPACITY (CC-WL,HP ONLY)
Iron: 62 ug/dL (ref 45–182)
Saturation Ratios: 19 % (ref 17.9–39.5)
TIBC: 333 ug/dL (ref 250–450)
UIBC: 271 ug/dL (ref 117–376)

## 2021-12-30 LAB — FERRITIN: Ferritin: 21 ng/mL — ABNORMAL LOW (ref 24–336)

## 2021-12-30 LAB — VITAMIN B12: Vitamin B-12: 232 pg/mL (ref 180–914)

## 2021-12-30 LAB — LACTATE DEHYDROGENASE: LDH: 139 U/L (ref 98–192)

## 2021-12-30 NOTE — Progress Notes (Signed)
1 so present along working Hematology and Oncology Follow Up Visit  Clarence Hopkins [id number] 08-Apr-1930 87 y.o. 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  Current Therapy:   S/p I-131 therapy on 08/27/2019 Vismodegib 150 mg po q day -- start on 12/01/2021     Interim History:  Clarence Hopkins is back for follow-up.  Thankfully, he is finally getting the Erivedge.  He is responding.  The serosal under the right eye certainly appears to be flatter.  He has had no problems with the Erivedge.  He has had no diarrhea.  He has had no blood pressure problems.  He has had no bleeding.  There is been no nausea or vomiting.  He still has had the problems with fatigue and weakness.  He also has a thyroid cancer that could be causing this.  I am just happy that he is responding.  One of the big issues with trying to get the [place] for him.  Thankfully, Clarence Hopkins has done a great job to be able to get samples for him.  We will see if we can get some samples for him as he continues to take treatment and hopefully this tumor will shrink.  He has had no bleeding.  He has had no fever.  He has had no rashes.  He does have horrible solar keratosis and actinic keratoses.  I think he sees dermatology on a frequent basis.  We did do iron studies on him today.  His ferritin was 21 with an iron saturation of 19%.  His vitamin B12 was 232.  His appetite has been okay.  He has had no nausea or vomiting.  Currently, I would say his performance status is ECOG 2.   Medications:  Current Outpatient Medications:    Cholecalciferol (VITAMIN D3) 50 MCG (2000 UT) capsule, Take 1 capsule (2,000 Units total) by mouth daily., Disp: 100 capsule, Rfl: 3   levothyroxine (SYNTHROID) 200 MCG tablet, Take 200 mcg by mouth daily., Disp: , Rfl:    levothyroxine (SYNTHROID) 25 MCG tablet, Take 25 mcg by mouth daily. Take with 200 mcg tablet  to total 225 mcg dose by Clarence Hopkins, Disp: , Rfl:    Polyethylene Glycol 3350 (MIRALAX PO), Take by mouth., Disp: , Rfl:    tamsulosin (FLOMAX) 0.4 MG CAPS capsule, 1 po qhs, Disp: 90 capsule, Rfl: 3   warfarin (COUMADIN) 5 MG tablet, Take 5 mg on  Tue, Thur, Sat, Sun. Take 2.5 mg on Mon, Wed, Fri, Disp: 100 tablet, Rfl: 3  Allergies: No Known Allergies  Past Medical History, Surgical history, Social history, and Family History were reviewed and updated.  Review of Systems: Review of Systems  Constitutional: Negative.   HENT:  Negative.    Eyes: Negative.   Respiratory: Negative.    Cardiovascular: Negative.   Gastrointestinal: Negative.   Endocrine: Negative.   Genitourinary: Negative.    Musculoskeletal: Negative.   Skin: Negative.   Neurological: Negative.   Hematological: Negative.   Psychiatric/Behavioral: Negative.      Physical Exam:  height is 6\' 3"  (1.905 m) and weight is 198 lb (89.8 kg). His oral temperature is 98 F (36.7 C). His pulse is 63. His respiration is 16 and oxygen saturation is 100%.   Wt Readings from Last 3 Encounters:  12/30/21 198 lb (89.8 kg)  12/28/21 198 lb (89.8 kg)  11/28/21 199 lb (90.3 kg)    Physical  Exam Vitals reviewed.  HENT:     Head: Normocephalic and atraumatic.     Comments: Under the right eye is a nodule.  This deftly appears to be flatter.  It is not as prominent.  It probably measures 4 x 2 mm.   Eyes:     Pupils: Pupils are equal, round, and reactive to light.  Cardiovascular:     Heart sounds: Normal heart sounds.     Comments: Cardiac exam is slow but regular.  He does have occasional extra beats.  He does have an occasional skipped beat.  He has 1/6 systolic ejection murmur. Pulmonary:     Effort: Pulmonary effort is normal.     Breath sounds: Normal breath sounds.  Abdominal:     General: Bowel sounds are normal.     Palpations: Abdomen is soft.  Musculoskeletal:        General: No tenderness or deformity. Normal  range of motion.     Cervical back: Normal range of motion.  Lymphadenopathy:     Cervical: No cervical adenopathy.  Skin:    General: Skin is warm and dry.     Findings: No erythema or rash.  Neurological:     Mental Status: He is alert and oriented to person, place, and time.  Psychiatric:        Behavior: Behavior normal.        Thought Content: Thought content normal.        Judgment: Judgment normal.      Lab Results  Component Value Date   WBC 6.1 12/30/2021   HGB 12.5 (L) 12/30/2021   HCT 40.7 12/30/2021   MCV 86.8 12/30/2021   PLT 219 12/30/2021     Chemistry      Component Value Date/Time   NA 139 11/28/2021 1539   NA 142 11/29/2020 0000   K 4.4 11/28/2021 1539   CL 103 11/28/2021 1539   CO2 31 11/28/2021 1539   BUN 16 11/28/2021 1539   BUN 19 11/29/2020 0000   CREATININE 1.16 11/28/2021 1539   GLU 95 11/29/2020 0000      Component Value Date/Time   CALCIUM 9.2 11/28/2021 1539   ALKPHOS 83 11/28/2021 1539   AST 18 11/28/2021 1539   ALT 13 11/28/2021 1539   BILITOT 0.5 11/28/2021 1539      Impression and Plan: Mr. Meisinger is a very nice 88 year old white male.  He has metastatic thyroid cancer.  This is a differentiated thyroid cancer.  Thankfully it is responsive to the radioactive iodine.  He is still responding.  He sees Clarence Hopkins for treatment of the thyroid cancer.  He has a basal cell carcinoma under the right eye.  He is on Erivedge for this.  Again I think he is responding.  He is only been on it for about 3 weeks.  We will continue to follow this up.  I forgot to mention that he really does need to have a CT of his chest so we can evaluate the thyroid cancer.  We cannot forget about him having thyroid cancer.  I would like to see him back in about a month or so.  We will get the CT scan the same day that I see him.  I saw that his iron studies are borderline.  We will have to follow-up with this and see how his iron studies look the next time we  see him.       [name], MD 11/17/20231:43 PM

## 2022-01-04 DIAGNOSIS — H02532 Eyelid retraction right lower eyelid: Secondary | ICD-10-CM | POA: Diagnosis not present

## 2022-01-04 DIAGNOSIS — Z961 Presence of intraocular lens: Secondary | ICD-10-CM | POA: Diagnosis not present

## 2022-01-04 DIAGNOSIS — H0270 Unspecified degenerative disorders of eyelid and periocular area: Secondary | ICD-10-CM | POA: Diagnosis not present

## 2022-01-04 DIAGNOSIS — C441122 Basal cell carcinoma of skin of right lower eyelid, including canthus: Secondary | ICD-10-CM | POA: Diagnosis not present

## 2022-01-04 DIAGNOSIS — H16211 Exposure keratoconjunctivitis, right eye: Secondary | ICD-10-CM | POA: Diagnosis not present

## 2022-01-17 ENCOUNTER — Ambulatory Visit (INDEPENDENT_AMBULATORY_CARE_PROVIDER_SITE_OTHER): Payer: Medicare Other

## 2022-01-17 DIAGNOSIS — Z7901 Long term (current) use of anticoagulants: Secondary | ICD-10-CM

## 2022-01-17 LAB — POCT INR: INR: 2.4 (ref 2.0–3.0)

## 2022-01-17 NOTE — Progress Notes (Addendum)
Continue to take 1 tablet daily except take 1/2 tablet on Mondays and Fridays. Recheck in 4 weeks.   Pt reports that he began taking vismodegib oral chemotherapy for basal cell carcinoma under right eye.

## 2022-01-17 NOTE — Patient Instructions (Addendum)
Pre visit review using our clinic review tool, if applicable. No additional management support is needed unless otherwise documented below in the visit note.  Continue to take 1 tablet daily except take 1/2 tablet on Mondays and Fridays. Recheck in 4 weeks.

## 2022-01-19 DIAGNOSIS — L812 Freckles: Secondary | ICD-10-CM | POA: Diagnosis not present

## 2022-01-19 DIAGNOSIS — Z85828 Personal history of other malignant neoplasm of skin: Secondary | ICD-10-CM | POA: Diagnosis not present

## 2022-01-19 DIAGNOSIS — L308 Other specified dermatitis: Secondary | ICD-10-CM | POA: Diagnosis not present

## 2022-01-19 DIAGNOSIS — D1801 Hemangioma of skin and subcutaneous tissue: Secondary | ICD-10-CM | POA: Diagnosis not present

## 2022-01-19 DIAGNOSIS — L821 Other seborrheic keratosis: Secondary | ICD-10-CM | POA: Diagnosis not present

## 2022-01-19 DIAGNOSIS — Z85820 Personal history of malignant melanoma of skin: Secondary | ICD-10-CM | POA: Diagnosis not present

## 2022-01-30 ENCOUNTER — Ambulatory Visit (HOSPITAL_BASED_OUTPATIENT_CLINIC_OR_DEPARTMENT_OTHER)
Admission: RE | Admit: 2022-01-30 | Discharge: 2022-01-30 | Disposition: A | Discharge status: Home / Self Care | Payer: Medicare Other | Source: Ambulatory Visit | Attending: Hematology & Oncology | Admitting: Hematology & Oncology

## 2022-01-30 ENCOUNTER — Encounter (HOSPITAL_BASED_OUTPATIENT_CLINIC_OR_DEPARTMENT_OTHER): Payer: Self-pay

## 2022-01-30 ENCOUNTER — Other Ambulatory Visit: Payer: Self-pay

## 2022-01-30 ENCOUNTER — Inpatient Hospital Stay: Payer: Medicare Other | Attending: Hematology & Oncology

## 2022-01-30 ENCOUNTER — Inpatient Hospital Stay (HOSPITAL_BASED_OUTPATIENT_CLINIC_OR_DEPARTMENT_OTHER): Payer: Medicare Other | Admitting: Hematology & Oncology

## 2022-01-30 ENCOUNTER — Encounter: Payer: Self-pay | Admitting: Hematology & Oncology

## 2022-01-30 VITALS — BP 164/81 | HR 66 | Temp 98.1°F | Resp 18 | Ht 75.0 in | Wt 199.0 lb

## 2022-01-30 DIAGNOSIS — C73 Malignant neoplasm of thyroid gland: Secondary | ICD-10-CM | POA: Insufficient documentation

## 2022-01-30 DIAGNOSIS — C44319 Basal cell carcinoma of skin of other parts of face: Secondary | ICD-10-CM | POA: Insufficient documentation

## 2022-01-30 DIAGNOSIS — E042 Nontoxic multinodular goiter: Secondary | ICD-10-CM | POA: Diagnosis not present

## 2022-01-30 DIAGNOSIS — D509 Iron deficiency anemia, unspecified: Secondary | ICD-10-CM | POA: Diagnosis not present

## 2022-01-30 DIAGNOSIS — C441122 Basal cell carcinoma of skin of right lower eyelid, including canthus: Secondary | ICD-10-CM

## 2022-01-30 DIAGNOSIS — D500 Iron deficiency anemia secondary to blood loss (chronic): Secondary | ICD-10-CM | POA: Diagnosis not present

## 2022-01-30 LAB — CBC WITH DIFFERENTIAL (CANCER CENTER ONLY)
Abs Immature Granulocytes: 0.01 10*3/uL (ref 0.00–0.07)
Basophils Absolute: 0 10*3/uL (ref 0.0–0.1)
Basophils Relative: 0 %
Eosinophils Absolute: 0.2 10*3/uL (ref 0.0–0.5)
Eosinophils Relative: 3 %
HCT: 39.1 % (ref 39.0–52.0)
Hemoglobin: 12.3 g/dL — ABNORMAL LOW (ref 13.0–17.0)
Immature Granulocytes: 0 %
Lymphocytes Relative: 24 %
Lymphs Abs: 1.5 10*3/uL (ref 0.7–4.0)
MCH: 27.2 pg (ref 26.0–34.0)
MCHC: 31.5 g/dL (ref 30.0–36.0)
MCV: 86.3 fL (ref 80.0–100.0)
Monocytes Absolute: 0.6 10*3/uL (ref 0.1–1.0)
Monocytes Relative: 10 %
Neutro Abs: 3.9 10*3/uL (ref 1.7–7.7)
Neutrophils Relative %: 63 %
Platelet Count: 216 10*3/uL (ref 150–400)
RBC: 4.53 MIL/uL (ref 4.22–5.81)
RDW: 13.5 % (ref 11.5–15.5)
WBC Count: 6.2 10*3/uL (ref 4.0–10.5)
nRBC: 0 % (ref 0.0–0.2)

## 2022-01-30 LAB — RETICULOCYTES
Immature Retic Fract: 6.4 % (ref 2.3–15.9)
RBC.: 4.5 MIL/uL (ref 4.22–5.81)
Retic Count, Absolute: 34.7 10*3/uL (ref 19.0–186.0)
Retic Ct Pct: 0.8 % (ref 0.4–3.1)

## 2022-01-30 LAB — CMP (CANCER CENTER ONLY)
ALT: 16 U/L (ref 0–44)
AST: 18 U/L (ref 15–41)
Albumin: 4 g/dL (ref 3.5–5.0)
Alkaline Phosphatase: 90 U/L (ref 38–126)
Anion gap: 4 — ABNORMAL LOW (ref 5–15)
BUN: 18 mg/dL (ref 8–23)
CO2: 31 mmol/L (ref 22–32)
Calcium: 9.4 mg/dL (ref 8.9–10.3)
Chloride: 103 mmol/L (ref 98–111)
Creatinine: 1.34 mg/dL — ABNORMAL HIGH (ref 0.61–1.24)
GFR, Estimated: 51 mL/min — ABNORMAL LOW (ref >60)
Glucose, Bld: 106 mg/dL — ABNORMAL HIGH (ref 70–99)
Potassium: 4.7 mmol/L (ref 3.5–5.1)
Sodium: 138 mmol/L (ref 135–145)
Total Bilirubin: 0.5 mg/dL (ref 0.3–1.2)
Total Protein: 7 g/dL (ref 6.5–8.1)

## 2022-01-30 LAB — FERRITIN: Ferritin: 26 ng/mL (ref 24–336)

## 2022-01-30 MED ORDER — IOHEXOL 300 MG/ML  SOLN
100.0000 mL | Freq: Once | INTRAMUSCULAR | Status: AC | PRN
  Administered 2022-01-30: 75 mL via INTRAVENOUS

## 2022-01-30 NOTE — Progress Notes (Signed)
1 so present along working Hematology and Oncology Follow Up Visit  Clarence Hopkins [id number] [birth year] 88 y.o. 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 Iron deficiency anemia  Current Therapy:   S/p I-131 therapy on 08/27/2019 Vismodegib 150 mg po q day -- start on 12/01/2021 Injectafer 750 mg IV IV given on 02/08/2022     Interim History:  Clarence Hopkins is back for follow-up.  He continues to respond to the [place].  The tumor under the right continues to flatten out.  It is very impressive as to how well this is worked and how well he has done with the [place].  We did do a CT of his chest today.  This is because of his papillary thyroid cancer.  Again, there is been no evidence of tumor progression with respect to the thyroid cancer.  I know he is followed by Dr. Hartford Hopkins of endocrinology.  He has had no problems with nausea or vomiting.  He has had no cough or shortness of breath.  He has had no chest wall pain.  He has had no change in bowel or bladder habits.  There is been no swelling in the legs.  He has had a little bit of a rash.  This is a papular type of rash.  I guess this might be from the [place].  He says that his dermatologist gave him a cream to put on his skin but he is yet to get this filled.  Overall, I would have to say that his performance status is probably ECOG 2.     Medications:  Current Outpatient Medications:    triamcinolone cream (KENALOG) 0.1 %, Apply 1 Application topically 2 (two) times daily., Disp: , Rfl:    Cholecalciferol (VITAMIN D3) 50 MCG (2000 UT) capsule, Take 1 capsule (2,000 Units total) by mouth daily., Disp: 100 capsule, Rfl: 3   levothyroxine (SYNTHROID) 200 MCG tablet, Take 200 mcg by mouth daily., Disp: , Rfl:    levothyroxine (SYNTHROID) 25 MCG tablet, Take 25 mcg by mouth daily. Take with 200 mcg tablet to total 225 mcg dose by Dr. Hartford Hopkins, Disp: , Rfl:     Polyethylene Glycol 3350 (MIRALAX PO), Take by mouth., Disp: , Rfl:    tamsulosin (FLOMAX) 0.4 MG CAPS capsule, 1 po qhs, Disp: 90 capsule, Rfl: 3   warfarin (COUMADIN) 5 MG tablet, Take 5 mg on  Tue, Thur, Sat, Sun. Take 2.5 mg on Mon, Wed, Fri, Disp: 100 tablet, Rfl: 3  Allergies: No Known Allergies  Past Medical History, Surgical history, Social history, and Family History were reviewed and updated.  Review of Systems: Review of Systems  Constitutional: Negative.   HENT:  Negative.    Eyes: Negative.   Respiratory: Negative.    Cardiovascular: Negative.   Gastrointestinal: Negative.   Endocrine: Negative.   Genitourinary: Negative.    Musculoskeletal: Negative.   Skin: Negative.   Neurological: Negative.   Hematological: Negative.   Psychiatric/Behavioral: Negative.      Physical Exam:  height is 6\' 3"  (1.905 m) and weight is 199 lb (90.3 kg). His oral temperature is 98.1 F (36.7 C). His blood pressure is 164/81 (abnormal) and his pulse is 66. His respiration is 18 and oxygen saturation is 98%.   Wt Readings from Last 3 Encounters:  01/30/22 199 lb (90.3 kg)  12/30/21 198 lb (89.8 kg)  12/28/21 198 lb (89.8 kg)    Physical  Exam Vitals reviewed.  HENT:     Head: Normocephalic and atraumatic.     Comments: Under the right eye is a nodule.  This deftly appears to be flatter.  It is not as prominent.  It probably measures 4 x 2 mm.   Eyes:     Pupils: Pupils are equal, round, and reactive to light.  Cardiovascular:     Heart sounds: Normal heart sounds.     Comments: Cardiac exam is slow but regular.  He does have occasional extra beats.  He does have an occasional skipped beat.  He has 1/6 systolic ejection murmur. Pulmonary:     Effort: Pulmonary effort is normal.     Breath sounds: Normal breath sounds.  Abdominal:     General: Bowel sounds are normal.     Palpations: Abdomen is soft.  Musculoskeletal:        General: No tenderness or deformity. Normal range of  motion.     Cervical back: Normal range of motion.  Lymphadenopathy:     Cervical: No cervical adenopathy.  Skin:    General: Skin is warm and dry.     Findings: No erythema or rash.  Neurological:     Mental Status: He is alert and oriented to person, place, and time.  Psychiatric:        Behavior: Behavior normal.        Thought Content: Thought content normal.        Judgment: Judgment normal.      Lab Results  Component Value Date   WBC 6.2 01/30/2022   HGB 12.3 (L) 01/30/2022   HCT 39.1 01/30/2022   MCV 86.3 01/30/2022   PLT 216 01/30/2022     Chemistry      Component Value Date/Time   NA 138 01/30/2022 1345   NA 142 11/29/2020 0000   K 4.7 01/30/2022 1345   CL 103 01/30/2022 1345   CO2 31 01/30/2022 1345   BUN 18 01/30/2022 1345   BUN 19 11/29/2020 0000   CREATININE 1.34 (H) 01/30/2022 1345   GLU 95 11/29/2020 0000      Component Value Date/Time   CALCIUM 9.4 01/30/2022 1345   ALKPHOS 90 01/30/2022 1345   AST 18 01/30/2022 1345   ALT 16 01/30/2022 1345   BILITOT 0.5 01/30/2022 1345      Impression and Plan: Clarence Hopkins is a very nice 88 year old white male.  He has metastatic thyroid cancer.  This is a differentiated thyroid cancer.  Thankfully it is responsive to the radioactive iodine.  He is still responding.  He sees Dr. Hartford Hopkins for treatment of the thyroid cancer.  He has a basal cell carcinoma under the right eye.  He is on Erivedge for this.  Again  he is responding.  He is only been on it for about 2 months.  I am just glad that he is responding.  I am unsure how long he has to be on the [place].  I am unsure if he we will need to have any kind of surgery to resect out any residual disease.  I know he does see ophthalmology with Clarence Hopkins.  It sounds like he will see Clarence Hopkins in the next couple weeks.  At this point, we do not need any other CAT scans on him probably for another 3 months.  I will plan to see him back myself in another 6  weeks or so.    [name], MD 12/18/20233:43 PM  ADDENDUM: His iron studies are quite low.  Ferritin is only 26 with an iron saturation of 15%.  I think that he would benefit from some IV iron.  I would be leery about oral iron just because of all the medications that he takes and the fact that he may not absorb oral iron all that well.  Lattie Haw, MD

## 2022-01-31 LAB — IRON AND IRON BINDING CAPACITY (CC-WL,HP ONLY)
Iron: 46 ug/dL (ref 45–182)
Saturation Ratios: 15 % — ABNORMAL LOW (ref 17.9–39.5)
TIBC: 298 ug/dL (ref 250–450)
UIBC: 252 ug/dL (ref 117–376)

## 2022-02-01 ENCOUNTER — Encounter: Payer: Self-pay | Admitting: Hematology & Oncology

## 2022-02-01 ENCOUNTER — Telehealth: Payer: Self-pay

## 2022-02-01 DIAGNOSIS — D500 Iron deficiency anemia secondary to blood loss (chronic): Secondary | ICD-10-CM | POA: Insufficient documentation

## 2022-02-01 NOTE — Telephone Encounter (Signed)
Called and informed patient of lab results, patient verbalized understanding and denies any questions or concerns at this time. Message sent to schedulers to call for this apt.

## 2022-02-01 NOTE — Telephone Encounter (Signed)
-----   Message from [name], MD sent at 01/31/2022  2:16 PM EST ----- Call and let him know that the iron level is actually low.  He probably needs to have IV iron given that he is on medications.  Please set this up for a week or so.  Thanks.  [name]

## 2022-02-01 NOTE — Addendum Note (Signed)
Addended by: [name] R on: 02/01/2022 01:41 PM   Modules accepted: Orders

## 2022-02-14 ENCOUNTER — Ambulatory Visit (INDEPENDENT_AMBULATORY_CARE_PROVIDER_SITE_OTHER): Payer: Medicare Other

## 2022-02-14 DIAGNOSIS — Z7901 Long term (current) use of anticoagulants: Secondary | ICD-10-CM

## 2022-02-14 LAB — POCT INR: INR: 3.3 — AB (ref 2.0–3.0)

## 2022-02-14 NOTE — Patient Instructions (Addendum)
Pre visit review using our clinic review tool, if applicable. No additional management support is needed unless otherwise documented below in the visit note.  Hold dose tomorrow and the continue to take 1 tablet daily except take 1/2 tablet on Mondays and Fridays. Recheck in 2 weeks.

## 2022-02-14 NOTE — Progress Notes (Cosign Needed Addendum)
Pt already took dose today. Pt reports nose bleed twice this week. He has been able stop the bleeding within 10 minutes.   Hold dose tomorrow and the continue to take 1 tablet daily except take 1/2 tablet on Mondays and Fridays. Recheck in 2 weeks.   Pt reports that he began taking vismodegib oral chemotherapy for basal cell carcinoma under right eye. Medical screening examination/treatment/procedure(s) were performed by non-physician practitioner and as supervising physician I was immediately available for consultation/collaboration.  I agree with above. Lew Dawes, MD

## 2022-02-15 DIAGNOSIS — H0270 Unspecified degenerative disorders of eyelid and periocular area: Secondary | ICD-10-CM | POA: Diagnosis not present

## 2022-02-15 DIAGNOSIS — H02532 Eyelid retraction right lower eyelid: Secondary | ICD-10-CM | POA: Diagnosis not present

## 2022-02-15 DIAGNOSIS — C441122 Basal cell carcinoma of skin of right lower eyelid, including canthus: Secondary | ICD-10-CM | POA: Diagnosis not present

## 2022-02-15 DIAGNOSIS — H16211 Exposure keratoconjunctivitis, right eye: Secondary | ICD-10-CM | POA: Diagnosis not present

## 2022-02-15 DIAGNOSIS — Z961 Presence of intraocular lens: Secondary | ICD-10-CM | POA: Diagnosis not present

## 2022-02-17 ENCOUNTER — Inpatient Hospital Stay: Payer: Medicare Other | Attending: Hematology & Oncology

## 2022-02-17 VITALS — BP 137/74 | HR 57 | Temp 97.8°F | Resp 18

## 2022-02-17 DIAGNOSIS — K5900 Constipation, unspecified: Secondary | ICD-10-CM | POA: Insufficient documentation

## 2022-02-17 DIAGNOSIS — D509 Iron deficiency anemia, unspecified: Secondary | ICD-10-CM | POA: Insufficient documentation

## 2022-02-17 DIAGNOSIS — C73 Malignant neoplasm of thyroid gland: Secondary | ICD-10-CM | POA: Diagnosis not present

## 2022-02-17 DIAGNOSIS — I4891 Unspecified atrial fibrillation: Secondary | ICD-10-CM | POA: Diagnosis not present

## 2022-02-17 DIAGNOSIS — D500 Iron deficiency anemia secondary to blood loss (chronic): Secondary | ICD-10-CM

## 2022-02-17 DIAGNOSIS — C44319 Basal cell carcinoma of skin of other parts of face: Secondary | ICD-10-CM | POA: Diagnosis not present

## 2022-02-17 DIAGNOSIS — Z79890 Hormone replacement therapy (postmenopausal): Secondary | ICD-10-CM | POA: Diagnosis not present

## 2022-02-17 MED ORDER — SODIUM CHLORIDE 0.9 % IV SOLN
750.0000 mg | Freq: Once | INTRAVENOUS | Status: AC
  Administered 2022-02-17: 750 mg via INTRAVENOUS
  Filled 2022-02-17: qty 15

## 2022-02-17 MED ORDER — SODIUM CHLORIDE 0.9 % IV SOLN: Freq: Once | INTRAVENOUS | Status: AC

## 2022-02-24 ENCOUNTER — Inpatient Hospital Stay: Payer: Medicare Other

## 2022-02-24 VITALS — BP 120/70 | HR 74 | Temp 97.5°F | Resp 18

## 2022-02-24 DIAGNOSIS — D509 Iron deficiency anemia, unspecified: Secondary | ICD-10-CM | POA: Diagnosis not present

## 2022-02-24 DIAGNOSIS — Z79890 Hormone replacement therapy (postmenopausal): Secondary | ICD-10-CM | POA: Diagnosis not present

## 2022-02-24 DIAGNOSIS — D500 Iron deficiency anemia secondary to blood loss (chronic): Secondary | ICD-10-CM

## 2022-02-24 DIAGNOSIS — C44319 Basal cell carcinoma of skin of other parts of face: Secondary | ICD-10-CM | POA: Diagnosis not present

## 2022-02-24 DIAGNOSIS — C73 Malignant neoplasm of thyroid gland: Secondary | ICD-10-CM | POA: Diagnosis not present

## 2022-02-24 DIAGNOSIS — I4891 Unspecified atrial fibrillation: Secondary | ICD-10-CM | POA: Diagnosis not present

## 2022-02-24 DIAGNOSIS — K5900 Constipation, unspecified: Secondary | ICD-10-CM | POA: Diagnosis not present

## 2022-02-24 MED ORDER — SODIUM CHLORIDE 0.9 % IV SOLN
750.0000 mg | Freq: Once | INTRAVENOUS | Status: AC
  Administered 2022-02-24: 750 mg via INTRAVENOUS
  Filled 2022-02-24: qty 15

## 2022-02-24 MED ORDER — SODIUM CHLORIDE 0.9 % IV SOLN: Freq: Once | INTRAVENOUS | Status: AC

## 2022-02-24 NOTE — Patient Instructions (Signed)
Ferric Carboxymaltose Injection What is this medication? FERRIC CARBOXYMALTOSE (FER ik kar BOX ee MAWL tose) treats low levels of iron in your body (iron deficiency anemia). Iron is a mineral that plays an important role in making red blood cells, which carry oxygen from your lungs to the rest of your body. This medicine may be used for other purposes; ask your health care provider or pharmacist if you have questions. COMMON BRAND NAME(S): Injectafer What should I tell my care team before I take this medication? They need to know if you have any of these conditions: High blood pressure Hyperparathyroidism Inflammatory bowel disease Low levels of vitamin D in your blood Previously received ferric carboxymaltose Problems absorbing certain vitamins or phosphate in your body An unusual or allergic reaction to iron, other medications, foods, dyes, or preservatives Pregnant or trying to get pregnant Breastfeeding How should I use this medication? This medication is injected into a vein. It is given by your care team in a hospital or clinic setting. Talk to your care team about the use of this medication in children. While it may be given to children as young as 1 year for selected conditions, precautions do apply. Overdosage: If you think you have taken too much of this medicine contact a poison control center or emergency room at once. NOTE: This medicine is only for you. Do not share this medicine with others. What if I miss a dose? Keep appointments for follow-up doses. It is important not to miss your dose. Call your care team if you are unable to keep an appointment. What may interact with this medication? Do not take this medication with any of the following: Deferoxamine Dimercaprol Other iron products This list may not describe all possible interactions. Give your health care provider a list of all the medicines, herbs, non-prescription drugs, or dietary supplements you use. Also tell  them if you smoke, drink alcohol, or use illegal drugs. Some items may interact with your medicine. What should I watch for while using this medication? Visit your care team for regular checks on your progress. Tell your care team if your symptoms do not start to get better or if they get worse. You may need blood work while you are taking this medication. You may need to eat more foods that contain iron. Talk to your care team. Foods that contain iron include whole grains/cereals, dried fruits, beans, or peas, leafy green vegetables, and organ meats (liver, kidney). What side effects may I notice from receiving this medication? Side effects that you should report to your care team as soon as possible: Allergic reactions--skin rash, itching, hives, swelling of the face, lips, tongue, or throat Increase in blood pressure Low blood pressure--dizziness, feeling faint or lightheaded, blurry vision Low phosphorus level--fatigue, muscle weakness or pain, bone or joint pain, bone fractures Shortness of breath Side effects that usually do not require medical attention (report to your care team if they continue or are bothersome): Flushing Headache Nausea Pain, redness, or irritation at injection site Vomiting This list may not describe all possible side effects. Call your doctor for medical advice about side effects. You may report side effects to FDA at 1-800-FDA-1088. Where should I keep my medication? This medication is given in a hospital or clinic. It will not be stored at home. NOTE: This sheet is a summary. It may not cover all possible information. If you have questions about this medicine, talk to your doctor, pharmacist, or health care provider.  2023 Elsevier/Gold  Standard (2021-07-18 00:00:00)

## 2022-02-28 ENCOUNTER — Ambulatory Visit: Payer: Medicare Other

## 2022-03-01 ENCOUNTER — Telehealth: Payer: Self-pay

## 2022-03-01 NOTE — Telephone Encounter (Signed)
Pt missed coumadin clinic apt yesterday. Contacted pt's wife, who reports pt has not been feeling well. He is being seen tomorrow for his symptoms.  Pt's wife RS coumadin clinic apt for 1/19 @ 10 am.

## 2022-03-02 DIAGNOSIS — H11241 Scarring of conjunctiva, right eye: Secondary | ICD-10-CM | POA: Diagnosis not present

## 2022-03-02 DIAGNOSIS — L720 Epidermal cyst: Secondary | ICD-10-CM | POA: Diagnosis not present

## 2022-03-02 DIAGNOSIS — H10401 Unspecified chronic conjunctivitis, right eye: Secondary | ICD-10-CM | POA: Diagnosis not present

## 2022-03-02 DIAGNOSIS — D23112 Other benign neoplasm of skin of right lower eyelid, including canthus: Secondary | ICD-10-CM | POA: Diagnosis not present

## 2022-03-02 DIAGNOSIS — C441122 Basal cell carcinoma of skin of right lower eyelid, including canthus: Secondary | ICD-10-CM | POA: Diagnosis not present

## 2022-03-02 DIAGNOSIS — L821 Other seborrheic keratosis: Secondary | ICD-10-CM | POA: Diagnosis not present

## 2022-03-03 ENCOUNTER — Ambulatory Visit (INDEPENDENT_AMBULATORY_CARE_PROVIDER_SITE_OTHER): Payer: Medicare Other

## 2022-03-03 DIAGNOSIS — Z7901 Long term (current) use of anticoagulants: Secondary | ICD-10-CM | POA: Diagnosis not present

## 2022-03-03 LAB — POCT INR: INR: 2.1 (ref 2.0–3.0)

## 2022-03-03 NOTE — Patient Instructions (Addendum)
Pre visit review using our clinic review tool, if applicable. No additional management support is needed unless otherwise documented below in the visit note.  Continue to take 1 tablet daily except take 1/2 tablet on Mondays and Fridays. Recheck in 4 weeks.

## 2022-03-03 NOTE — Progress Notes (Signed)
Continue to take 1 tablet daily except take 1/2 tablet on Mondays and Fridays. Recheck in 4 weeks.   Pt reports that he began taking vismodegib oral chemotherapy for basal cell carcinoma under right eye.

## 2022-03-13 ENCOUNTER — Other Ambulatory Visit: Payer: Self-pay

## 2022-03-13 ENCOUNTER — Encounter: Payer: Self-pay | Admitting: Hematology & Oncology

## 2022-03-13 ENCOUNTER — Inpatient Hospital Stay (HOSPITAL_BASED_OUTPATIENT_CLINIC_OR_DEPARTMENT_OTHER): Payer: Medicare Other | Admitting: Hematology & Oncology

## 2022-03-13 ENCOUNTER — Inpatient Hospital Stay: Payer: Medicare Other

## 2022-03-13 ENCOUNTER — Ambulatory Visit: Payer: Medicare Other | Admitting: Hematology & Oncology

## 2022-03-13 VITALS — BP 119/68 | HR 66 | Resp 18 | Ht 75.0 in | Wt 185.0 lb

## 2022-03-13 DIAGNOSIS — I4891 Unspecified atrial fibrillation: Secondary | ICD-10-CM | POA: Diagnosis not present

## 2022-03-13 DIAGNOSIS — C73 Malignant neoplasm of thyroid gland: Secondary | ICD-10-CM

## 2022-03-13 DIAGNOSIS — K5900 Constipation, unspecified: Secondary | ICD-10-CM | POA: Diagnosis not present

## 2022-03-13 DIAGNOSIS — C44319 Basal cell carcinoma of skin of other parts of face: Secondary | ICD-10-CM | POA: Diagnosis not present

## 2022-03-13 DIAGNOSIS — Z85828 Personal history of other malignant neoplasm of skin: Secondary | ICD-10-CM

## 2022-03-13 DIAGNOSIS — C441122 Basal cell carcinoma of skin of right lower eyelid, including canthus: Secondary | ICD-10-CM

## 2022-03-13 DIAGNOSIS — Z79890 Hormone replacement therapy (postmenopausal): Secondary | ICD-10-CM | POA: Diagnosis not present

## 2022-03-13 DIAGNOSIS — D509 Iron deficiency anemia, unspecified: Secondary | ICD-10-CM | POA: Diagnosis not present

## 2022-03-13 LAB — CBC WITH DIFFERENTIAL (CANCER CENTER ONLY)
Abs Immature Granulocytes: 0.02 10*3/uL (ref 0.00–0.07)
Basophils Absolute: 0 10*3/uL (ref 0.0–0.1)
Basophils Relative: 1 %
Eosinophils Absolute: 0.2 10*3/uL (ref 0.0–0.5)
Eosinophils Relative: 3 %
HCT: 45.3 % (ref 39.0–52.0)
Hemoglobin: 14.3 g/dL (ref 13.0–17.0)
Immature Granulocytes: 0 %
Lymphocytes Relative: 21 %
Lymphs Abs: 1.2 10*3/uL (ref 0.7–4.0)
MCH: 27.8 pg (ref 26.0–34.0)
MCHC: 31.6 g/dL (ref 30.0–36.0)
MCV: 88.1 fL (ref 80.0–100.0)
Monocytes Absolute: 0.6 10*3/uL (ref 0.1–1.0)
Monocytes Relative: 10 %
Neutro Abs: 4 10*3/uL (ref 1.7–7.7)
Neutrophils Relative %: 65 %
Platelet Count: 214 10*3/uL (ref 150–400)
RBC: 5.14 MIL/uL (ref 4.22–5.81)
RDW: 15.6 % — ABNORMAL HIGH (ref 11.5–15.5)
WBC Count: 6 10*3/uL (ref 4.0–10.5)
nRBC: 0 % (ref 0.0–0.2)

## 2022-03-13 LAB — CMP (CANCER CENTER ONLY)
ALT: 23 U/L (ref 0–44)
AST: 23 U/L (ref 15–41)
Albumin: 4 g/dL (ref 3.5–5.0)
Alkaline Phosphatase: 108 U/L (ref 38–126)
Anion gap: 6 (ref 5–15)
BUN: 12 mg/dL (ref 8–23)
CO2: 29 mmol/L (ref 22–32)
Calcium: 9.3 mg/dL (ref 8.9–10.3)
Chloride: 103 mmol/L (ref 98–111)
Creatinine: 1.14 mg/dL (ref 0.61–1.24)
GFR, Estimated: >60 mL/min (ref >60)
Glucose, Bld: 94 mg/dL (ref 70–99)
Potassium: 4.6 mmol/L (ref 3.5–5.1)
Sodium: 138 mmol/L (ref 135–145)
Total Bilirubin: 0.9 mg/dL (ref 0.3–1.2)
Total Protein: 7 g/dL (ref 6.5–8.1)

## 2022-03-13 LAB — PHOSPHORUS: Phosphorus: 1.7 mg/dL — ABNORMAL LOW (ref 2.5–4.6)

## 2022-03-13 LAB — LACTATE DEHYDROGENASE: LDH: 131 U/L (ref 98–192)

## 2022-03-13 NOTE — Progress Notes (Signed)
1 so present along working Hematology and Oncology Follow Up Visit  Clarence Hopkins [id number] Feb 26, 1930 89 y.o. 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 Iron deficiency anemia  Current Therapy:   S/p I-131 therapy on 08/27/2019 Vismodegib 150 mg po q day -- start on 12/01/2021 - on hold since 03/04/2022 Injectafer 750 mg IV - given on 02/08/2022     Interim History:  Clarence Hopkins is back for follow-up.  Despite the iron that we gave him, he is certainly not any better.  He did have a biopsy under the right eye recently.  Hopefully, we will see that he has responded to the vismodegib.  He is just weak.  He is lost weight.  His appetite is down.  He has had abdominal pain.  I really think that we have to stop the vismodegib.  I think that this might be causing his side effects.  Again given his age, he does not have a lot of of "reserve" and we have to be very cautious.  He does have other health issues.  He does have the atrial fibrillation.  He also has a metastatic papillary carcinoma of the thyroid.  I think we probably need to send him over another CT scan to see how the thyroid cancer looks.  Last scan was done back in December.  I just would have thought that the iron that we gave him would have helped.  When we saw him back in December, his ferritin was 26 and iron saturation of 15%.  He is little bit constipated.  I told him to take his MiraLAX twice a day.  Again, given his advanced age, he certainly is intolerant to having issues.  He is not sure when he sees Clarence Hopkins of Endocrinology for the metastatic thyroid cancer.  He has had no fever.  He has had no bleeding.  He has had no vomiting.  Overall, I would say that his performance status is probably ECOG 2.   Medications:  Current Outpatient Medications:    Ibuprofen-diphenhydrAMINE Cit (ADVIL PM PO), Take 1 tablet by mouth as needed.,  Disp: , Rfl:    Tobramycin-dexAMETHasone 0.3-0.05 % SUSP, Place 1 drop into the right eye as directed., Disp: , Rfl:    Cholecalciferol (VITAMIN D3) 50 MCG (2000 UT) capsule, Take 1 capsule (2,000 Units total) by mouth daily., Disp: 100 capsule, Rfl: 3   levothyroxine (SYNTHROID) 200 MCG tablet, Take 200 mcg by mouth daily., Disp: , Rfl:    levothyroxine (SYNTHROID) 25 MCG tablet, Take 25 mcg by mouth daily. Take with 200 mcg tablet to total 225 mcg dose by Dr. Hartford Hopkins, Disp: , Rfl:    Polyethylene Glycol 3350 (MIRALAX PO), Take by mouth., Disp: , Rfl:    tamsulosin (FLOMAX) 0.4 MG CAPS capsule, 1 po qhs, Disp: 90 capsule, Rfl: 3   triamcinolone cream (KENALOG) 0.1 %, Apply 1 Application topically 2 (two) times daily., Disp: , Rfl:    warfarin (COUMADIN) 5 MG tablet, Take 5 mg on  Tue, Thur, Sat, Sun. Take 2.5 mg on Mon, Wed, Fri, Disp: 100 tablet, Rfl: 3  Allergies: No Known Allergies  Past Medical History, Surgical history, Social history, and Family History were reviewed and updated.  Review of Systems: Review of Systems  Constitutional: Negative.   HENT:  Negative.    Eyes: Negative.   Respiratory: Negative.    Cardiovascular: Negative.   Gastrointestinal: Negative.  Endocrine: Negative.   Genitourinary: Negative.    Musculoskeletal: Negative.   Skin: Negative.   Neurological: Negative.   Hematological: Negative.   Psychiatric/Behavioral: Negative.      Physical Exam:  height is 6\' 3"  (1.905 m) and weight is 185 lb (83.9 kg). His blood pressure is 119/68 and his pulse is 66. His respiration is 18 and oxygen saturation is 100%.   Wt Readings from Last 3 Encounters:  03/13/22 185 lb (83.9 kg)  01/30/22 199 lb (90.3 kg)  12/30/21 198 lb (89.8 kg)    Physical Exam Vitals reviewed.  HENT:     Head: Normocephalic and atraumatic.     Comments: Under the right eye is a nodule.  This deftly appears to be flatter.  It is not as prominent.  It probably measures 4 x 2 mm.   Eyes:      Pupils: Pupils are equal, round, and reactive to light.  Cardiovascular:     Heart sounds: Normal heart sounds.     Comments: Cardiac exam is slow but regular.  He does have occasional extra beats.  He does have an occasional skipped beat.  He has 1/6 systolic ejection murmur. Pulmonary:     Effort: Pulmonary effort is normal.     Breath sounds: Normal breath sounds.  Abdominal:     General: Bowel sounds are normal.     Palpations: Abdomen is soft.  Musculoskeletal:        General: No tenderness or deformity. Normal range of motion.     Cervical back: Normal range of motion.  Lymphadenopathy:     Cervical: No cervical adenopathy.  Skin:    General: Skin is warm and dry.     Findings: No erythema or rash.  Neurological:     Mental Status: He is alert and oriented to person, place, and time.  Psychiatric:        Behavior: Behavior normal.        Thought Content: Thought content normal.        Judgment: Judgment normal.     Lab Results  Component Value Date   WBC 6.0 03/13/2022   HGB 14.3 03/13/2022   HCT 45.3 03/13/2022   MCV 88.1 03/13/2022   PLT 214 03/13/2022     Chemistry      Component Value Date/Time   NA 138 03/13/2022 0943   NA 142 11/29/2020 0000   K 4.6 03/13/2022 0943   CL 103 03/13/2022 0943   CO2 29 03/13/2022 0943   BUN 12 03/13/2022 0943   BUN 19 11/29/2020 0000   CREATININE 1.14 03/13/2022 0943   GLU 95 11/29/2020 0000      Component Value Date/Time   CALCIUM 9.3 03/13/2022 0943   ALKPHOS 108 03/13/2022 0943   AST 23 03/13/2022 0943   ALT 23 03/13/2022 0943   BILITOT 0.9 03/13/2022 0943      Impression and Plan: Clarence Hopkins is a very nice 89 year old white male.  He has metastatic thyroid cancer.  This is a differentiated thyroid cancer.  Thankfully it is responsive to the radioactive iodine.  He is still responding.  He sees Dr. Hartford Hopkins for treatment of the thyroid cancer.  He has a basal cell carcinoma under the right eye.  He is on Erivedge  for this.  Again  he is responding.  He hasonly been on it for about 3 months.  He just had a biopsy done.  It will be interesting to see what the biopsy shows.  Again, I had to believe that his current status might be related to him being on the [place].  I do not see about him stopping it right now.  Again has had a very nice response to date.  We will go ahead and check the CT of the chest to make sure there is nothing going on with his thyroid cancer.  He really does need to go back to see Dr. Hartford Hopkins of Endocrinology for this.  He really does not see his other doctors.  He feels that I should be taking care of all of his issues.  I told him that I really am not responsible for all of his health issues.  However, since she sees [place] pretty much exclusively, we will do what we can to try to get him to feel better.  I told him to try some over-the-counter Pepcid to help with the abdominal pain.  I will await the biopsy from the eye.  Will see about the CT scan.  Will have him come back to see me in about 2 or 3 weeks.      [name], MD 1/29/202410:37 AM

## 2022-03-15 ENCOUNTER — Other Ambulatory Visit: Payer: Self-pay | Admitting: Hematology & Oncology

## 2022-03-15 ENCOUNTER — Other Ambulatory Visit: Payer: Self-pay | Admitting: Internal Medicine

## 2022-03-15 MED ORDER — K-PHOS-NEUTRAL 155-852-130 MG PO TABS: 1.0000 | ORAL_TABLET | Freq: Three times a day (TID) | ORAL | 3 refills | Status: DC

## 2022-03-16 ENCOUNTER — Telehealth: Payer: Self-pay

## 2022-03-16 DIAGNOSIS — L309 Dermatitis, unspecified: Secondary | ICD-10-CM | POA: Diagnosis not present

## 2022-03-16 DIAGNOSIS — Z85820 Personal history of malignant melanoma of skin: Secondary | ICD-10-CM | POA: Diagnosis not present

## 2022-03-16 DIAGNOSIS — Z85828 Personal history of other malignant neoplasm of skin: Secondary | ICD-10-CM | POA: Diagnosis not present

## 2022-03-16 DIAGNOSIS — L821 Other seborrheic keratosis: Secondary | ICD-10-CM | POA: Diagnosis not present

## 2022-03-16 NOTE — Telephone Encounter (Signed)
Called and informed patient of lab results, patient verbalized understanding and denies any questions or concerns at this time.   

## 2022-03-16 NOTE — Telephone Encounter (Signed)
-----  Message from [name], MD sent at 03/15/2022  4:47 PM EST ----- Call - the phosphate is low.  This might be causing him to have the fatigue.  He needs to start taking phosphorus pills.  I will call this in to his pharmacy.  Thanks.  [name]

## 2022-03-21 ENCOUNTER — Telehealth: Payer: Self-pay

## 2022-03-21 ENCOUNTER — Ambulatory Visit (HOSPITAL_COMMUNITY)
Admission: RE | Admit: 2022-03-21 | Discharge: 2022-03-21 | Disposition: A | Discharge status: Home / Self Care | Payer: Medicare Other | Source: Ambulatory Visit | Attending: Hematology & Oncology | Admitting: Hematology & Oncology

## 2022-03-21 ENCOUNTER — Other Ambulatory Visit: Payer: Self-pay

## 2022-03-21 DIAGNOSIS — C73 Malignant neoplasm of thyroid gland: Secondary | ICD-10-CM | POA: Insufficient documentation

## 2022-03-21 DIAGNOSIS — R918 Other nonspecific abnormal finding of lung field: Secondary | ICD-10-CM | POA: Diagnosis not present

## 2022-03-21 MED ORDER — IOHEXOL 300 MG/ML  SOLN
75.0000 mL | Freq: Once | INTRAMUSCULAR | Status: AC | PRN
  Administered 2022-03-21: 75 mL via INTRAVENOUS

## 2022-03-21 MED ORDER — SODIUM CHLORIDE (PF) 0.9 % IJ SOLN
INTRAMUSCULAR | Status: AC
  Filled 2022-03-21: qty 50

## 2022-03-21 MED ORDER — AZITHROMYCIN 250 MG PO TABS: ORAL_TABLET | ORAL | 0 refills | Status: DC

## 2022-03-21 NOTE — Telephone Encounter (Signed)
-----   Message from [name], MD sent at 03/21/2022  1:56 PM EST ----- Call let him know that the CT scans does not show any obvious cancer progression.  He may have a little bit of inflammation in the right lung.  This might be a little bit of bronchitis.  Just to be on the safe side, please call in a Z-Pak for him.  Thanks.  [name]

## 2022-03-21 NOTE — Telephone Encounter (Signed)
Called and left voicemail with results stating that the CT scan did not show any obvious cancer progression. There is inflammation in the right lung which may be a bit of bronchitis and that a Z-pak was sent in to his pharmacy. This RN stated that patient could call back with any questions or concerns.

## 2022-03-22 DIAGNOSIS — C441122 Basal cell carcinoma of skin of right lower eyelid, including canthus: Secondary | ICD-10-CM | POA: Diagnosis not present

## 2022-03-22 DIAGNOSIS — H02532 Eyelid retraction right lower eyelid: Secondary | ICD-10-CM | POA: Diagnosis not present

## 2022-03-22 DIAGNOSIS — H0279 Other degenerative disorders of eyelid and periocular area: Secondary | ICD-10-CM | POA: Diagnosis not present

## 2022-03-22 DIAGNOSIS — Z961 Presence of intraocular lens: Secondary | ICD-10-CM | POA: Diagnosis not present

## 2022-03-22 DIAGNOSIS — H16211 Exposure keratoconjunctivitis, right eye: Secondary | ICD-10-CM | POA: Diagnosis not present

## 2022-03-27 ENCOUNTER — Telehealth: Payer: Self-pay | Admitting: *Deleted

## 2022-03-27 ENCOUNTER — Telehealth: Payer: Self-pay

## 2022-03-27 NOTE — Telephone Encounter (Signed)
Pt called to cancel coumadin clinic apt for tomorrow. He has tested positive for covid. Pt will contact PCP office for further care since he is having some congestion. Gave pt number to primary care office. Advised if any changes to contact coumadin clinic or PCP office. Pt verbalized understanding.

## 2022-03-27 NOTE — Telephone Encounter (Signed)
Pt has tested pos on 03/26/2022... SXS started 01/23/2023. Sxs are congestion, cough, runny nose and low-grade fever, fatigue.  Pt asked that meds be sent in to [facility] on file.

## 2022-03-27 NOTE — Telephone Encounter (Signed)
Received call from patient stating that he has been diagnosed with Covid.  Inquiring who will manage this.  Advised him to call his primary care physician.   Patient appreciates the call and will follow up

## 2022-03-28 ENCOUNTER — Ambulatory Visit: Payer: Medicare Other

## 2022-03-28 MED ORDER — MOLNUPIRAVIR EUA 200MG CAPSULE: 4.0000 | ORAL_CAPSULE | Freq: Two times a day (BID) | ORAL | 0 refills | Status: AC

## 2022-03-28 NOTE — Telephone Encounter (Signed)
Notified pt MD sent rx to [facility].Marland KitchenJohny Hopkins

## 2022-03-28 NOTE — Telephone Encounter (Signed)
I will send a Molnupiravir prescription in. Schedule office visit in 10-14 days.  Thank you

## 2022-03-28 NOTE — Addendum Note (Signed)
Addended by: Cassandria Anger on: 03/28/2022 07:36 AM   Modules accepted: Orders

## 2022-04-01 ENCOUNTER — Other Ambulatory Visit (HOSPITAL_BASED_OUTPATIENT_CLINIC_OR_DEPARTMENT_OTHER): Payer: Self-pay

## 2022-04-01 ENCOUNTER — Other Ambulatory Visit: Payer: Self-pay | Admitting: Internal Medicine

## 2022-04-01 MED ORDER — FINASTERIDE 5 MG PO TABS
5.0000 mg | ORAL_TABLET | Freq: Every day | ORAL | 3 refills | Status: DC
  Filled 2022-04-21: qty 90, 90d supply, fill #0

## 2022-04-01 MED ORDER — TRIAMCINOLONE ACETONIDE 0.1 % EX CREA: 1.0000 | TOPICAL_CREAM | Freq: Two times a day (BID) | CUTANEOUS | 2 refills | Status: DC

## 2022-04-01 MED ORDER — LEVOTHYROXINE SODIUM 200 MCG PO TABS: 200.0000 ug | ORAL_TABLET | Freq: Every day | ORAL | 3 refills | Status: DC

## 2022-04-01 MED ORDER — LEVOTHYROXINE SODIUM 25 MCG PO TABS: 12.5000 ug | ORAL_TABLET | Freq: Every day | ORAL | 3 refills | Status: DC

## 2022-04-03 ENCOUNTER — Other Ambulatory Visit (HOSPITAL_BASED_OUTPATIENT_CLINIC_OR_DEPARTMENT_OTHER): Payer: Self-pay

## 2022-04-04 ENCOUNTER — Other Ambulatory Visit (HOSPITAL_BASED_OUTPATIENT_CLINIC_OR_DEPARTMENT_OTHER): Payer: Self-pay

## 2022-04-06 ENCOUNTER — Other Ambulatory Visit: Payer: Self-pay | Admitting: *Deleted

## 2022-04-06 ENCOUNTER — Other Ambulatory Visit (HOSPITAL_BASED_OUTPATIENT_CLINIC_OR_DEPARTMENT_OTHER): Payer: Self-pay

## 2022-04-06 ENCOUNTER — Inpatient Hospital Stay: Payer: Medicare Other | Attending: Hematology & Oncology

## 2022-04-06 ENCOUNTER — Inpatient Hospital Stay: Payer: Medicare Other | Admitting: Hematology & Oncology

## 2022-04-06 DIAGNOSIS — E890 Postprocedural hypothyroidism: Secondary | ICD-10-CM | POA: Diagnosis not present

## 2022-04-06 MED ORDER — TAMSULOSIN HCL 0.4 MG PO CAPS
0.4000 mg | ORAL_CAPSULE | Freq: Every day | ORAL | 0 refills | Status: DC
  Filled 2022-04-06 – 2022-04-21 (×3): qty 30, 30d supply, fill #0

## 2022-04-06 NOTE — Telephone Encounter (Signed)
Rec'd msg drom Margie Ege, [place]...Marland KitchenMarland KitchenHi Clarence Hopkins! This patient says he has run out of tamsulosin and the refill request that we sent was denied stating that the patient needs an appointment. The wife said that they have to make an appointment for next week due to covid follow up. Could we possibly get a 1 month prescription sent in to hold the patient over? We are a new pharmacy for them. -- [facility] at [facility] pt is overdue for appt, but will send 30 day to [place].Marland KitchenJohny Hopkins

## 2022-04-07 ENCOUNTER — Other Ambulatory Visit (HOSPITAL_BASED_OUTPATIENT_CLINIC_OR_DEPARTMENT_OTHER): Payer: Self-pay

## 2022-04-11 NOTE — Telephone Encounter (Signed)
LVM

## 2022-04-12 NOTE — Telephone Encounter (Signed)
LVM on cell number. Contacted pt's wife at home number. She will remind him he needs to RS his coumadin clinic apt.

## 2022-04-13 ENCOUNTER — Other Ambulatory Visit (HOSPITAL_BASED_OUTPATIENT_CLINIC_OR_DEPARTMENT_OTHER): Payer: Self-pay

## 2022-04-14 ENCOUNTER — Other Ambulatory Visit (HOSPITAL_BASED_OUTPATIENT_CLINIC_OR_DEPARTMENT_OTHER): Payer: Self-pay

## 2022-04-19 DIAGNOSIS — H02532 Eyelid retraction right lower eyelid: Secondary | ICD-10-CM | POA: Diagnosis not present

## 2022-04-19 DIAGNOSIS — Z961 Presence of intraocular lens: Secondary | ICD-10-CM | POA: Diagnosis not present

## 2022-04-19 DIAGNOSIS — H0279 Other degenerative disorders of eyelid and periocular area: Secondary | ICD-10-CM | POA: Diagnosis not present

## 2022-04-19 DIAGNOSIS — C441122 Basal cell carcinoma of skin of right lower eyelid, including canthus: Secondary | ICD-10-CM | POA: Diagnosis not present

## 2022-04-19 DIAGNOSIS — H16211 Exposure keratoconjunctivitis, right eye: Secondary | ICD-10-CM | POA: Diagnosis not present

## 2022-04-21 ENCOUNTER — Other Ambulatory Visit (HOSPITAL_BASED_OUTPATIENT_CLINIC_OR_DEPARTMENT_OTHER): Payer: Self-pay

## 2022-04-23 ENCOUNTER — Other Ambulatory Visit: Payer: Self-pay | Admitting: Internal Medicine

## 2022-04-27 NOTE — Telephone Encounter (Signed)
Contacted pt and scheduled coumadin clinic apt for tomorrow.

## 2022-04-28 ENCOUNTER — Ambulatory Visit (INDEPENDENT_AMBULATORY_CARE_PROVIDER_SITE_OTHER): Payer: Medicare Other

## 2022-04-28 DIAGNOSIS — Z7901 Long term (current) use of anticoagulants: Secondary | ICD-10-CM | POA: Diagnosis not present

## 2022-04-28 LAB — POCT INR: INR: 1.7 — AB (ref 2.0–3.0)

## 2022-04-28 NOTE — Progress Notes (Signed)
Increase dose today to take 1 tablet and then continue to take 1 tablet daily except take 1/2 tablet on Mondays and Fridays. Recheck in 3 weeks.

## 2022-04-28 NOTE — Patient Instructions (Addendum)
Pre visit review using our clinic review tool, if applicable. No additional management support is needed unless otherwise documented below in the visit note.  Increase dose today to take 1 tablet and then continue to take 1 tablet daily except take 1/2 tablet on Mondays and Fridays. Recheck in 3 weeks.

## 2022-05-09 ENCOUNTER — Ambulatory Visit (HOSPITAL_BASED_OUTPATIENT_CLINIC_OR_DEPARTMENT_OTHER)
Admission: RE | Admit: 2022-05-09 | Discharge: 2022-05-09 | Disposition: A | Discharge status: Home / Self Care | Payer: Medicare Other | Source: Ambulatory Visit | Attending: Hematology & Oncology | Admitting: Hematology & Oncology

## 2022-05-09 ENCOUNTER — Inpatient Hospital Stay (HOSPITAL_BASED_OUTPATIENT_CLINIC_OR_DEPARTMENT_OTHER): Payer: Medicare Other | Admitting: Hematology & Oncology

## 2022-05-09 ENCOUNTER — Encounter: Payer: Self-pay | Admitting: Hematology & Oncology

## 2022-05-09 ENCOUNTER — Inpatient Hospital Stay: Payer: Medicare Other | Attending: Hematology & Oncology

## 2022-05-09 ENCOUNTER — Other Ambulatory Visit: Payer: Self-pay

## 2022-05-09 VITALS — BP 121/79 | HR 70 | Temp 97.7°F | Resp 18 | Ht 75.0 in | Wt 183.0 lb

## 2022-05-09 DIAGNOSIS — Z79620 Long term (current) use of immunosuppressive biologic: Secondary | ICD-10-CM | POA: Diagnosis not present

## 2022-05-09 DIAGNOSIS — R911 Solitary pulmonary nodule: Secondary | ICD-10-CM | POA: Diagnosis not present

## 2022-05-09 DIAGNOSIS — C73 Malignant neoplasm of thyroid gland: Secondary | ICD-10-CM

## 2022-05-09 DIAGNOSIS — R053 Chronic cough: Secondary | ICD-10-CM | POA: Diagnosis not present

## 2022-05-09 DIAGNOSIS — R918 Other nonspecific abnormal finding of lung field: Secondary | ICD-10-CM | POA: Diagnosis not present

## 2022-05-09 LAB — CBC WITH DIFFERENTIAL (CANCER CENTER ONLY)
Abs Immature Granulocytes: 0.02 10*3/uL (ref 0.00–0.07)
Basophils Absolute: 0 10*3/uL (ref 0.0–0.1)
Basophils Relative: 1 %
Eosinophils Absolute: 0.1 10*3/uL (ref 0.0–0.5)
Eosinophils Relative: 2 %
HCT: 43 % (ref 39.0–52.0)
Hemoglobin: 13.8 g/dL (ref 13.0–17.0)
Immature Granulocytes: 0 %
Lymphocytes Relative: 21 %
Lymphs Abs: 1.2 10*3/uL (ref 0.7–4.0)
MCH: 29 pg (ref 26.0–34.0)
MCHC: 32.1 g/dL (ref 30.0–36.0)
MCV: 90.3 fL (ref 80.0–100.0)
Monocytes Absolute: 0.6 10*3/uL (ref 0.1–1.0)
Monocytes Relative: 10 %
Neutro Abs: 3.8 10*3/uL (ref 1.7–7.7)
Neutrophils Relative %: 66 %
Platelet Count: 211 10*3/uL (ref 150–400)
RBC: 4.76 MIL/uL (ref 4.22–5.81)
RDW: 14 % (ref 11.5–15.5)
WBC Count: 5.7 10*3/uL (ref 4.0–10.5)
nRBC: 0 % (ref 0.0–0.2)

## 2022-05-09 LAB — CMP (CANCER CENTER ONLY)
ALT: 16 U/L (ref 0–44)
AST: 18 U/L (ref 15–41)
Albumin: 3.9 g/dL (ref 3.5–5.0)
Alkaline Phosphatase: 134 U/L — ABNORMAL HIGH (ref 38–126)
Anion gap: 5 (ref 5–15)
BUN: 19 mg/dL (ref 8–23)
CO2: 31 mmol/L (ref 22–32)
Calcium: 9.5 mg/dL (ref 8.9–10.3)
Chloride: 103 mmol/L (ref 98–111)
Creatinine: 1.07 mg/dL (ref 0.61–1.24)
GFR, Estimated: >60 mL/min (ref >60)
Glucose, Bld: 96 mg/dL (ref 70–99)
Potassium: 4.6 mmol/L (ref 3.5–5.1)
Sodium: 139 mmol/L (ref 135–145)
Total Bilirubin: 0.7 mg/dL (ref 0.3–1.2)
Total Protein: 7.1 g/dL (ref 6.5–8.1)

## 2022-05-09 LAB — LACTATE DEHYDROGENASE: LDH: 141 U/L (ref 98–192)

## 2022-05-09 LAB — IRON AND IRON BINDING CAPACITY (CC-WL,HP ONLY)
Iron: 73 ug/dL (ref 45–182)
Saturation Ratios: 33 % (ref 17.9–39.5)
TIBC: 224 ug/dL — ABNORMAL LOW (ref 250–450)
UIBC: 151 ug/dL (ref 117–376)

## 2022-05-09 LAB — TSH: TSH: 0.051 u[IU]/mL — ABNORMAL LOW (ref 0.350–4.500)

## 2022-05-09 LAB — RETICULOCYTES
Immature Retic Fract: 4.1 % (ref 2.3–15.9)
RBC.: 4.82 MIL/uL (ref 4.22–5.81)
Retic Count, Absolute: 41.9 10*3/uL (ref 19.0–186.0)
Retic Ct Pct: 0.9 % (ref 0.4–3.1)

## 2022-05-09 LAB — FERRITIN: Ferritin: 260 ng/mL (ref 24–336)

## 2022-05-09 NOTE — Progress Notes (Signed)
1 so present along working Hematology and Oncology Follow Up Visit  Clarence Hopkins [id number] 25-Jul-1930 89 y.o. 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 Iron deficiency anemia  Current Therapy:   S/p I-131 therapy on 08/27/2019 Vismodegib 150 mg po q day -- start on 12/01/2021 - on hold since 03/04/2022 Injectafer 750 mg IV - given on 02/08/2022     Interim History:  Clarence Hopkins is back for follow-up.  He is doing okay.  He has been off the vismodegib for 6 weeks.  He is feeling a little bit better being off this.  He says that his right eyes still doing okay.  He had a very nice response to the this motor get.  I told him that if his eye starts to bother him, we could always get him back on the best mode to get at every other day dosing.  I think he did seeing Dr. Leonard Schwartz of Ophthalmology.  He does have a cough.  His wife says he coughs all the time.  I am not sure exactly what this indicates.  However, we will get a chest x-ray on him.  He did have a CT scan of the chest that was done back in February.  Everything looked relatively stable with respect to his thyroid cancer.    He is still working part-time.  His appetite is good.  He has had no problems with pain.  He has gotten IV iron.  This is helped quite a bit.  We will see what his iron studies look like.  Overall, I would have said that his performance status right now is ECOG 2.   Medications:  Current Outpatient Medications:    azithromycin (ZITHROMAX Z-PAK) 250 MG tablet, Take as directed on packaging., Disp: 6 each, Rfl: 0   Cholecalciferol (VITAMIN D3) 50 MCG (2000 UT) capsule, Take 1 capsule (2,000 Units total) by mouth daily., Disp: 100 capsule, Rfl: 3   finasteride (PROSCAR) 5 MG tablet, Take 1 tablet (5 mg total) by mouth daily., Disp: 90 tablet, Rfl: 3   Ibuprofen-diphenhydrAMINE Cit (ADVIL PM PO), Take 1 tablet by mouth as  needed., Disp: , Rfl:    K Phos Mono-Sod Phos Di & Mono (K-PHOS-NEUTRAL) [phone] MG TABS, Take 1 tablet by mouth 3 (three) times daily., Disp: 90 tablet, Rfl: 3   levothyroxine (SYNTHROID) 200 MCG tablet, Take 200 mcg by mouth daily., Disp: , Rfl:    levothyroxine (SYNTHROID) 200 MCG tablet, Take 1 tablet (200 mcg total) by mouth daily for a total of 246mcg per day., Disp: 90 tablet, Rfl: 3   levothyroxine (SYNTHROID) 25 MCG tablet, Take 25 mcg by mouth daily. Take with 200 mcg tablet to total 225 mcg dose by Dr. Hartford Poli, Disp: , Rfl:    levothyroxine (SYNTHROID) 25 MCG tablet, Take 0.5 tablets (12.5 mcg total) by mouth daily for a total of 237mcg daily., Disp: 45 tablet, Rfl: 3   Polyethylene Glycol 3350 (MIRALAX PO), Take by mouth., Disp: , Rfl:    tamsulosin (FLOMAX) 0.4 MG CAPS capsule, Take 1 capsule (0.4 mg total) by mouth at bedtime. Need appointment for further refills., Disp: 30 capsule, Rfl: 0   Tobramycin-dexAMETHasone 0.3-0.05 % SUSP, Place 1 drop into the right eye as directed., Disp: , Rfl:    triamcinolone cream (KENALOG) 0.1 %, Apply 1 Application topically 2 (two) times daily., Disp: , Rfl:    triamcinolone cream (KENALOG) 0.1 %,  Apply 1 Application (1 gram) topically 2 (two) times daily., Disp: 454 g, Rfl: 2   warfarin (COUMADIN) 5 MG tablet, Take 5 mg on  Tue, Thur, Sat, Sun. Take 2.5 mg on Mon, Wed, Fri, Disp: 100 tablet, Rfl: 3  Allergies: No Known Allergies  Past Medical History, Surgical history, Social history, and Family History were reviewed and updated.  Review of Systems: Review of Systems  Constitutional: Negative.   HENT:  Negative.    Eyes: Negative.   Respiratory: Negative.    Cardiovascular: Negative.   Gastrointestinal: Negative.   Endocrine: Negative.   Genitourinary: Negative.    Musculoskeletal: Negative.   Skin: Negative.   Neurological: Negative.   Hematological: Negative.   Psychiatric/Behavioral: Negative.      Physical Exam: His vital  signs show a temperature of 97.7.  Pulse 70.  Blood pressure 121/79.  Weight is 183 pounds.  Wt Readings from Last 3 Encounters:  03/13/22 185 lb (83.9 kg)  01/30/22 199 lb (90.3 kg)  12/30/21 198 lb (89.8 kg)    Physical Exam Vitals reviewed.  HENT:     Head: Normocephalic and atraumatic.     Comments: The area under his right eye looks quite good.  I really do not see any obvious lesion.  There may be a little bit of fullness.  He has good extraocular muscle movement.   Eyes:     Pupils: Pupils are equal, round, and reactive to light.  Cardiovascular:     Heart sounds: Normal heart sounds.     Comments: Cardiac exam is slow but regular.  He does have occasional extra beats.  He does have an occasional skipped beat.  He has 1/6 systolic ejection murmur. Pulmonary:     Effort: Pulmonary effort is normal.     Breath sounds: Normal breath sounds.  Abdominal:     General: Bowel sounds are normal.     Palpations: Abdomen is soft.  Musculoskeletal:        General: No tenderness or deformity. Normal range of motion.     Cervical back: Normal range of motion.  Lymphadenopathy:     Cervical: No cervical adenopathy.  Skin:    General: Skin is warm and dry.     Findings: No erythema or rash.  Neurological:     Mental Status: He is alert and oriented to person, place, and time.  Psychiatric:        Behavior: Behavior normal.        Thought Content: Thought content normal.        Judgment: Judgment normal.      Lab Results  Component Value Date   WBC 5.7 05/09/2022   HGB 13.8 05/09/2022   HCT 43.0 05/09/2022   MCV 90.3 05/09/2022   PLT 211 05/09/2022     Chemistry      Component Value Date/Time   NA 138 03/13/2022 0943   NA 142 11/29/2020 0000   K 4.6 03/13/2022 0943   CL 103 03/13/2022 0943   CO2 29 03/13/2022 0943   BUN 12 03/13/2022 0943   BUN 19 11/29/2020 0000   CREATININE 1.14 03/13/2022 0943   GLU 95 11/29/2020 0000      Component Value Date/Time   CALCIUM  9.3 03/13/2022 0943   ALKPHOS 108 03/13/2022 0943   AST 23 03/13/2022 0943   ALT 23 03/13/2022 0943   BILITOT 0.9 03/13/2022 0943      Impression and Plan: Clarence Hopkins is a very nice 89 year old white  male.  He has metastatic thyroid cancer.  This is a differentiated thyroid cancer.  Thankfully it is responsive to the radioactive iodine.  He is still responding.  He sees Dr. Hartford Poli for treatment of the thyroid cancer.  He has a basal cell carcinoma under the right eye.  He i has been on this motor gave.  I think he is having some toxicity from this which is why we have stopped.  Everything looks good under the right eye.  I told him if there is any issue, we can always get him restarted on the this motor give at every other day dosing.  We will see what his iron studies look like.  We will also get a chest x-ray make sure thing looks fine with respect to his cough.  I cannot find anything on his physical exam when I listen to his chest.  I think that we are really doing quite well.  I will plan to get him back to see me in another 6 weeks.  At some point, we probably will need another CT scan of the chest to follow-up with the thyroid cancer.  We probably will get this after [facility] Day.    [name], MD 3/26/20248:04 AM

## 2022-05-11 ENCOUNTER — Telehealth: Payer: Self-pay

## 2022-05-11 NOTE — Telephone Encounter (Signed)
-----   Message from [name], MD sent at 05/11/2022  1:41 PM EDT ----- Please call him and let him know that there is no pneumonia or bronchitis.  The lung nodules have not grown or there are no additional lung nodules.  Thanks.

## 2022-05-11 NOTE — Telephone Encounter (Signed)
Called and LM with patients xray results and to call back if he has any questions or concerns.

## 2022-05-18 DIAGNOSIS — D2261 Melanocytic nevi of right upper limb, including shoulder: Secondary | ICD-10-CM | POA: Diagnosis not present

## 2022-05-18 DIAGNOSIS — Z85820 Personal history of malignant melanoma of skin: Secondary | ICD-10-CM | POA: Diagnosis not present

## 2022-05-18 DIAGNOSIS — L821 Other seborrheic keratosis: Secondary | ICD-10-CM | POA: Diagnosis not present

## 2022-05-18 DIAGNOSIS — D225 Melanocytic nevi of trunk: Secondary | ICD-10-CM | POA: Diagnosis not present

## 2022-05-18 DIAGNOSIS — C44719 Basal cell carcinoma of skin of left lower limb, including hip: Secondary | ICD-10-CM | POA: Diagnosis not present

## 2022-05-18 DIAGNOSIS — L570 Actinic keratosis: Secondary | ICD-10-CM | POA: Diagnosis not present

## 2022-05-18 DIAGNOSIS — Z85828 Personal history of other malignant neoplasm of skin: Secondary | ICD-10-CM | POA: Diagnosis not present

## 2022-05-18 DIAGNOSIS — D2262 Melanocytic nevi of left upper limb, including shoulder: Secondary | ICD-10-CM | POA: Diagnosis not present

## 2022-05-18 DIAGNOSIS — L853 Xerosis cutis: Secondary | ICD-10-CM | POA: Diagnosis not present

## 2022-05-19 ENCOUNTER — Ambulatory Visit (INDEPENDENT_AMBULATORY_CARE_PROVIDER_SITE_OTHER): Payer: Medicare Other

## 2022-05-19 DIAGNOSIS — Z7901 Long term (current) use of anticoagulants: Secondary | ICD-10-CM

## 2022-05-19 LAB — POCT INR: INR: 2.7 (ref 2.0–3.0)

## 2022-05-19 NOTE — Patient Instructions (Addendum)
Pre visit review using our clinic review tool, if applicable. No additional management support is needed unless otherwise documented below in the visit note.  Continue to take 1 tablet daily except take 1/2 tablet on Mondays and Fridays. Recheck in 4 weeks.  

## 2022-05-19 NOTE — Progress Notes (Signed)
Continue to take 1 tablet daily except take 1/2 tablet on Mondays and Fridays. Recheck in 4 weeks.  

## 2022-05-31 DIAGNOSIS — C441122 Basal cell carcinoma of skin of right lower eyelid, including canthus: Secondary | ICD-10-CM | POA: Diagnosis not present

## 2022-05-31 DIAGNOSIS — H16211 Exposure keratoconjunctivitis, right eye: Secondary | ICD-10-CM | POA: Diagnosis not present

## 2022-05-31 DIAGNOSIS — H02532 Eyelid retraction right lower eyelid: Secondary | ICD-10-CM | POA: Diagnosis not present

## 2022-05-31 DIAGNOSIS — Z961 Presence of intraocular lens: Secondary | ICD-10-CM | POA: Diagnosis not present

## 2022-05-31 DIAGNOSIS — H0279 Other degenerative disorders of eyelid and periocular area: Secondary | ICD-10-CM | POA: Diagnosis not present

## 2022-06-05 ENCOUNTER — Telehealth: Payer: Self-pay | Admitting: *Deleted

## 2022-06-05 ENCOUNTER — Other Ambulatory Visit (HOSPITAL_BASED_OUTPATIENT_CLINIC_OR_DEPARTMENT_OTHER): Payer: Self-pay

## 2022-06-05 ENCOUNTER — Other Ambulatory Visit: Payer: Self-pay | Admitting: Internal Medicine

## 2022-06-05 DIAGNOSIS — Z7901 Long term (current) use of anticoagulants: Secondary | ICD-10-CM

## 2022-06-05 MED ORDER — WARFARIN SODIUM 5 MG PO TABS: ORAL_TABLET | ORAL | 1 refills | Status: DC

## 2022-06-05 NOTE — Telephone Encounter (Signed)
Rec'd fax pt needing refill on Warfarin 5 mg. Take 1 tablet by mouth on Tuesday, Thursday, Saturday, and Sunday. Then take 1/2 on Monday, Wednesday Friday... Last filled 08/12/21.Marland KitchenRaechel Chute

## 2022-06-05 NOTE — Telephone Encounter (Signed)
Pt is compliant with warfarin management and PCP apts.  Sent in refill of warfarin to requested pharmacy.      

## 2022-06-06 ENCOUNTER — Other Ambulatory Visit: Payer: Self-pay

## 2022-06-06 ENCOUNTER — Other Ambulatory Visit (HOSPITAL_BASED_OUTPATIENT_CLINIC_OR_DEPARTMENT_OTHER): Payer: Self-pay

## 2022-06-06 MED ORDER — TAMSULOSIN HCL 0.4 MG PO CAPS
0.4000 mg | ORAL_CAPSULE | Freq: Every day | ORAL | 0 refills | Status: DC
  Filled 2022-06-06: qty 30, 30d supply, fill #0

## 2022-06-09 ENCOUNTER — Other Ambulatory Visit: Payer: Self-pay

## 2022-06-09 MED ORDER — WARFARIN SODIUM 5 MG PO TABS
5.0000 mg | ORAL_TABLET | Freq: Every day | ORAL | 1 refills | Status: DC
Start: 2022-06-09 — End: 2022-12-15
  Filled 2022-10-17: qty 100, 90d supply, fill #0

## 2022-06-09 NOTE — Addendum Note (Signed)
Addended by: Sherrie George A on: 06/09/2022 10:29 AM   Modules accepted: Orders

## 2022-06-09 NOTE — Telephone Encounter (Signed)
Pt has stated he was not able to his Warfarin from [facility] as they stated they did not receive a refill.  *Pt states he is out really needs it.

## 2022-06-09 NOTE — Telephone Encounter (Signed)
Last script seemed to have printed instead of transmitting to pharmacy. Sent in script to pharmacy.

## 2022-06-13 ENCOUNTER — Telehealth: Payer: Self-pay

## 2022-06-13 ENCOUNTER — Inpatient Hospital Stay: Payer: Medicare Other | Attending: Hematology & Oncology

## 2022-06-13 ENCOUNTER — Inpatient Hospital Stay (HOSPITAL_BASED_OUTPATIENT_CLINIC_OR_DEPARTMENT_OTHER): Payer: Medicare Other | Admitting: Hematology & Oncology

## 2022-06-13 ENCOUNTER — Encounter: Payer: Self-pay | Admitting: Hematology & Oncology

## 2022-06-13 VITALS — BP 149/81 | HR 57 | Temp 97.0°F | Resp 20 | Ht 75.0 in | Wt 185.5 lb

## 2022-06-13 DIAGNOSIS — C44319 Basal cell carcinoma of skin of other parts of face: Secondary | ICD-10-CM | POA: Diagnosis not present

## 2022-06-13 DIAGNOSIS — D509 Iron deficiency anemia, unspecified: Secondary | ICD-10-CM | POA: Insufficient documentation

## 2022-06-13 DIAGNOSIS — C73 Malignant neoplasm of thyroid gland: Secondary | ICD-10-CM

## 2022-06-13 LAB — CMP (CANCER CENTER ONLY)
ALT: 15 U/L (ref 0–44)
AST: 19 U/L (ref 15–41)
Albumin: 3.6 g/dL (ref 3.5–5.0)
Alkaline Phosphatase: 106 U/L (ref 38–126)
Anion gap: 3 — ABNORMAL LOW (ref 5–15)
BUN: 15 mg/dL (ref 8–23)
CO2: 31 mmol/L (ref 22–32)
Calcium: 9.4 mg/dL (ref 8.9–10.3)
Chloride: 102 mmol/L (ref 98–111)
Creatinine: 1.06 mg/dL (ref 0.61–1.24)
GFR, Estimated: >60 mL/min (ref >60)
Glucose, Bld: 77 mg/dL (ref 70–99)
Potassium: 4.8 mmol/L (ref 3.5–5.1)
Sodium: 136 mmol/L (ref 135–145)
Total Bilirubin: 0.7 mg/dL (ref 0.3–1.2)
Total Protein: 6.6 g/dL (ref 6.5–8.1)

## 2022-06-13 LAB — CBC WITH DIFFERENTIAL (CANCER CENTER ONLY)
Abs Immature Granulocytes: 0.01 10*3/uL (ref 0.00–0.07)
Basophils Absolute: 0 10*3/uL (ref 0.0–0.1)
Basophils Relative: 1 %
Eosinophils Absolute: 0.1 10*3/uL (ref 0.0–0.5)
Eosinophils Relative: 3 %
HCT: 43.1 % (ref 39.0–52.0)
Hemoglobin: 13.6 g/dL (ref 13.0–17.0)
Immature Granulocytes: 0 %
Lymphocytes Relative: 28 %
Lymphs Abs: 1.3 10*3/uL (ref 0.7–4.0)
MCH: 28.9 pg (ref 26.0–34.0)
MCHC: 31.6 g/dL (ref 30.0–36.0)
MCV: 91.5 fL (ref 80.0–100.0)
Monocytes Absolute: 0.6 10*3/uL (ref 0.1–1.0)
Monocytes Relative: 12 %
Neutro Abs: 2.7 10*3/uL (ref 1.7–7.7)
Neutrophils Relative %: 56 %
Platelet Count: 218 10*3/uL (ref 150–400)
RBC: 4.71 MIL/uL (ref 4.22–5.81)
RDW: 12.4 % (ref 11.5–15.5)
WBC Count: 4.8 10*3/uL (ref 4.0–10.5)
nRBC: 0 % (ref 0.0–0.2)

## 2022-06-13 LAB — IRON AND IRON BINDING CAPACITY (CC-WL,HP ONLY)
Iron: 103 ug/dL (ref 45–182)
Saturation Ratios: 47 % — ABNORMAL HIGH (ref 17.9–39.5)
TIBC: 218 ug/dL — ABNORMAL LOW (ref 250–450)
UIBC: 115 ug/dL — ABNORMAL LOW (ref 117–376)

## 2022-06-13 LAB — LACTATE DEHYDROGENASE: LDH: 129 U/L (ref 98–192)

## 2022-06-13 LAB — FERRITIN: Ferritin: 203 ng/mL (ref 24–336)

## 2022-06-13 NOTE — Telephone Encounter (Signed)
-----   Message from Josph Macho, MD sent at 06/13/2022 12:42 PM EDT ----- Please call and let him know that the iron levels are okay.  Thanks.  Cindee Lame

## 2022-06-13 NOTE — Telephone Encounter (Signed)
Called and informed patient of lab results, patient verbalized understanding and denies any questions or concerns at this time.   

## 2022-06-13 NOTE — Progress Notes (Signed)
1 so present along working Hematology and Oncology Follow Up Visit  Clarence Hopkins [id number] [birth year] 89 y.o. 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 Iron deficiency anemia  Current Therapy:   S/p I-131 therapy on 08/27/2019 Vismodegib 150 mg po q day -- start on 12/01/2021 - on hold since 03/04/2022 Injectafer 750 mg IV - given on 02/08/2022     Interim History:  Clarence Hopkins is back for follow-up.  He is doing okay.  He has been off the vismodegib for close to 3 months now.  He has been doing okay.  He still has some element of weakness.  I suspect that the weakness is probably related to his atrial fibrillation.  He has not been taking his warfarin for about 4 to 5 days.  He has had no problems with cough.  When we last saw him, we did do a chest x-ray which did not show any pneumonia.  His appetite has been okay.  He has had no problems with pain.  He has had no nausea or vomiting.  He has had no bleeding.  He has had no leg swelling.  He does have the metastatic thyroid cancer.  We are watch this also.  I know he does see Endocrinology for this.  He does see Dr. Westley Foots of Ophthalmology to help with respect to his basal cell of the lower eyelid on the right eye.  When we last saw him, his ferritin was 260 with an iron saturation of 33%.  Currently, I would have said that his performance status is probably ECOG 2.      Medications:  Current Outpatient Medications:    levothyroxine (SYNTHROID) 200 MCG tablet, Take 1 tablet (200 mcg total) by mouth daily for a total of per day., Disp: 90 tablet, Rfl: 3   levothyroxine (SYNTHROID) 25 MCG tablet, Take 25 mcg by mouth daily. Take with 200 mcg tablet to total 225 mcg dose by Dr. Shawnee Knapp, Disp: , Rfl:    Polyethylene Glycol 3350 (MIRALAX PO), Take by mouth., Disp: , Rfl:    tamsulosin (FLOMAX) 0.4 MG CAPS capsule, Take 1 capsule (0.4 mg total) by  mouth at bedtime. Need appointment for further refills., Disp: 30 capsule, Rfl: 0   finasteride (PROSCAR) 5 MG tablet, Take 1 tablet (5 mg total) by mouth daily. (Patient not taking: Reported on 06/13/2022), Disp: 90 tablet, Rfl: 3   warfarin (COUMADIN) 5 MG tablet, TAKE 1 TABLET BY MOUTH DAILY EXCEPT TAKE 1/2 TABLET ON MONDAYS AND FRIDAYS OR AS DIRECTED BY ANTICOAGULATION CLINIC (Patient not taking: Reported on 06/13/2022), Disp: 100 tablet, Rfl: 1  Allergies: No Known Allergies  Past Medical History, Surgical history, Social history, and Family History were reviewed and updated.  Review of Systems: Review of Systems  Constitutional: Negative.   HENT:  Negative.    Eyes: Negative.   Respiratory: Negative.    Cardiovascular: Negative.   Gastrointestinal: Negative.   Endocrine: Negative.   Genitourinary: Negative.    Musculoskeletal: Negative.   Skin: Negative.   Neurological: Negative.   Hematological: Negative.   Psychiatric/Behavioral: Negative.      Physical Exam: His vital signs show a temperature of 97.7.  Pulse 70.  Blood pressure 121/79.  Weight is 183 pounds.  Wt Readings from Last 3 Encounters:  06/13/22 185 lb 8 oz (84.1 kg)  05/09/22 183 lb (83 kg)  03/13/22 185 lb (83.9 kg)  Physical Exam Vitals reviewed.  HENT:     Head: Normocephalic and atraumatic.     Comments: The area under his right eye looks quite good.  I really do not see any obvious lesion.  There may be a little bit of fullness.  He has good extraocular muscle movement.   Eyes:     Pupils: Pupils are equal, round, and reactive to light.  Cardiovascular:     Heart sounds: Normal heart sounds.     Comments: Cardiac exam is slow but regular.  He does have occasional extra beats.  He does have an occasional skipped beat.  He has 1/6 systolic ejection murmur. Pulmonary:     Effort: Pulmonary effort is normal.     Breath sounds: Normal breath sounds.  Abdominal:     General: Bowel sounds are normal.      Palpations: Abdomen is soft.  Musculoskeletal:        General: No tenderness or deformity. Normal range of motion.     Cervical back: Normal range of motion.  Lymphadenopathy:     Cervical: No cervical adenopathy.  Skin:    General: Skin is warm and dry.     Findings: No erythema or rash.  Neurological:     Mental Status: He is alert and oriented to person, place, and time.  Psychiatric:        Behavior: Behavior normal.        Thought Content: Thought content normal.        Judgment: Judgment normal.      Lab Results  Component Value Date   WBC 4.8 06/13/2022   HGB 13.6 06/13/2022   HCT 43.1 06/13/2022   MCV 91.5 06/13/2022   PLT 218 06/13/2022     Chemistry      Component Value Date/Time   NA 136 06/13/2022 0910   NA 142 11/29/2020 0000   K 4.8 06/13/2022 0910   CL 102 06/13/2022 0910   CO2 31 06/13/2022 0910   BUN 15 06/13/2022 0910   BUN 19 11/29/2020 0000   CREATININE 1.06 06/13/2022 0910   GLU 95 11/29/2020 0000      Component Value Date/Time   CALCIUM 9.4 06/13/2022 0910   ALKPHOS 106 06/13/2022 0910   AST 19 06/13/2022 0910   ALT 15 06/13/2022 0910   BILITOT 0.7 06/13/2022 0910      Impression and Plan: [name] is a very nice 89 year old white male.  He has metastatic thyroid cancer.  This is a differentiated thyroid cancer.  Thankfully it is responsive to the radioactive iodine.  He is still responding.  He sees Dr. Shawnee Knapp for treatment of the thyroid cancer.  He has a basal cell carcinoma under the right eye.  He has been on Vismodegib.  This worked very well.  He did have some toxicity from this.  We have him off this right now.  Again he does have thyroid cancer.  We will have to do another CT of his chest.  I will plan to do this when we see him back.  I think 6-week follow-up is perfect for him.  I will plan to see him back after [facility] Day.  Will try to get the CT scan the same day.     Josph Macho, MD 4/30/202411:10  AM

## 2022-06-14 ENCOUNTER — Inpatient Hospital Stay: Payer: Medicare Other

## 2022-06-14 ENCOUNTER — Ambulatory Visit: Payer: Medicare Other | Admitting: Hematology & Oncology

## 2022-06-16 ENCOUNTER — Ambulatory Visit (INDEPENDENT_AMBULATORY_CARE_PROVIDER_SITE_OTHER): Payer: Medicare Other

## 2022-06-16 DIAGNOSIS — Z7901 Long term (current) use of anticoagulants: Secondary | ICD-10-CM | POA: Diagnosis not present

## 2022-06-16 LAB — POCT INR: INR: 1.4 — AB (ref 2.0–3.0)

## 2022-06-16 NOTE — Patient Instructions (Addendum)
Pre visit review using our clinic review tool, if applicable. No additional management support is needed unless otherwise documented below in the visit note.  Increase dose today to take 1 tablet and increase dose tomorrow to take 1 1/2 tablets and the continue to take 1 tablet daily except take 1/2 tablet on Mondays and Fridays. Recheck in 2 weeks.

## 2022-06-16 NOTE — Progress Notes (Signed)
Pt reports missing 2 days of warfarin due to running out. Pt reports he restarted yesterday.  Pt reports missing 2 days of warfarin due to running out. Pt reports he restarted yesterday.  Increase dose today to take 1 tablet and increase dose tomorrow to take 1 1/2 tablets and the continue to take 1 tablet daily except take 1/2 tablet on Mondays and Fridays. Recheck in 2 weeks.

## 2022-06-21 DIAGNOSIS — C73 Malignant neoplasm of thyroid gland: Secondary | ICD-10-CM | POA: Diagnosis not present

## 2022-06-21 DIAGNOSIS — E890 Postprocedural hypothyroidism: Secondary | ICD-10-CM | POA: Diagnosis not present

## 2022-06-21 DIAGNOSIS — C799 Secondary malignant neoplasm of unspecified site: Secondary | ICD-10-CM | POA: Diagnosis not present

## 2022-06-29 DIAGNOSIS — C73 Malignant neoplasm of thyroid gland: Secondary | ICD-10-CM | POA: Diagnosis not present

## 2022-06-29 DIAGNOSIS — E890 Postprocedural hypothyroidism: Secondary | ICD-10-CM | POA: Diagnosis not present

## 2022-06-29 DIAGNOSIS — Z1330 Encounter for screening examination for mental health and behavioral disorders, unspecified: Secondary | ICD-10-CM | POA: Diagnosis not present

## 2022-06-29 DIAGNOSIS — C799 Secondary malignant neoplasm of unspecified site: Secondary | ICD-10-CM | POA: Diagnosis not present

## 2022-06-30 ENCOUNTER — Ambulatory Visit (INDEPENDENT_AMBULATORY_CARE_PROVIDER_SITE_OTHER): Payer: Medicare Other

## 2022-06-30 DIAGNOSIS — Z7901 Long term (current) use of anticoagulants: Secondary | ICD-10-CM

## 2022-06-30 LAB — POCT INR: INR: 1.8 — AB (ref 2.0–3.0)

## 2022-06-30 NOTE — Patient Instructions (Addendum)
Pre visit review using our clinic review tool, if applicable. No additional management support is needed unless otherwise documented below in the visit note.  Increase dose today to take 1 tablet and then change weekly dose to take 1 tablet daily except take 1/2 tablet on Mondays. Recheck in 3 weeks.  

## 2022-06-30 NOTE — Progress Notes (Signed)
Increase dose today to take 1 tablet and then change weekly dose to take 1 tablet daily except take 1/2 tablet on Mondays. Recheck in 3 weeks.  

## 2022-07-18 ENCOUNTER — Ambulatory Visit (INDEPENDENT_AMBULATORY_CARE_PROVIDER_SITE_OTHER): Payer: Medicare Other

## 2022-07-18 DIAGNOSIS — Z7901 Long term (current) use of anticoagulants: Secondary | ICD-10-CM

## 2022-07-18 LAB — POCT INR: INR: 1.8 — AB (ref 2.0–3.0)

## 2022-07-18 NOTE — Patient Instructions (Addendum)
Pre visit review using our clinic review tool, if applicable. No additional management support is needed unless otherwise documented below in the visit note.  Increase dose today to take 1 1/2 tablets and then change weekly dose to take 1 tablet daily.  Recheck in 2 weeks.

## 2022-07-18 NOTE — Progress Notes (Addendum)
Increase dose today to take 1 1/2 tablets and then change weekly dose to take 1 tablet daily.  Recheck in 3 weeks.   Increase dose today to take 1 1/2 tablets and then change weekly dose to take 1 tablet daily.  Recheck in 2 weeks.   Medical screening examination/treatment/procedure(s) were performed by non-physician practitioner and as supervising physician I was immediately available for consultation/collaboration.  I agree with above. Jacinta Shoe, MD

## 2022-07-20 DIAGNOSIS — H527 Unspecified disorder of refraction: Secondary | ICD-10-CM | POA: Diagnosis not present

## 2022-07-20 DIAGNOSIS — H16211 Exposure keratoconjunctivitis, right eye: Secondary | ICD-10-CM | POA: Diagnosis not present

## 2022-07-20 DIAGNOSIS — Z961 Presence of intraocular lens: Secondary | ICD-10-CM | POA: Diagnosis not present

## 2022-07-20 DIAGNOSIS — H02532 Eyelid retraction right lower eyelid: Secondary | ICD-10-CM | POA: Diagnosis not present

## 2022-07-20 DIAGNOSIS — C441122 Basal cell carcinoma of skin of right lower eyelid, including canthus: Secondary | ICD-10-CM | POA: Diagnosis not present

## 2022-07-20 DIAGNOSIS — H0279 Other degenerative disorders of eyelid and periocular area: Secondary | ICD-10-CM | POA: Diagnosis not present

## 2022-07-21 ENCOUNTER — Ambulatory Visit: Payer: Medicare Other

## 2022-07-25 ENCOUNTER — Inpatient Hospital Stay (HOSPITAL_BASED_OUTPATIENT_CLINIC_OR_DEPARTMENT_OTHER): Payer: Medicare Other | Admitting: Hematology & Oncology

## 2022-07-25 ENCOUNTER — Other Ambulatory Visit: Payer: Self-pay

## 2022-07-25 ENCOUNTER — Inpatient Hospital Stay: Payer: Medicare Other | Attending: Hematology & Oncology

## 2022-07-25 ENCOUNTER — Ambulatory Visit (HOSPITAL_BASED_OUTPATIENT_CLINIC_OR_DEPARTMENT_OTHER)
Admission: RE | Admit: 2022-07-25 | Discharge: 2022-07-25 | Disposition: A | Discharge status: Home / Self Care | Payer: Medicare Other | Source: Ambulatory Visit | Attending: Hematology & Oncology | Admitting: Hematology & Oncology

## 2022-07-25 ENCOUNTER — Encounter: Payer: Self-pay | Admitting: Hematology & Oncology

## 2022-07-25 VITALS — BP 136/76 | HR 62 | Temp 97.7°F | Resp 19 | Ht 75.0 in | Wt 182.0 lb

## 2022-07-25 DIAGNOSIS — D500 Iron deficiency anemia secondary to blood loss (chronic): Secondary | ICD-10-CM

## 2022-07-25 DIAGNOSIS — D509 Iron deficiency anemia, unspecified: Secondary | ICD-10-CM | POA: Insufficient documentation

## 2022-07-25 DIAGNOSIS — C73 Malignant neoplasm of thyroid gland: Secondary | ICD-10-CM | POA: Insufficient documentation

## 2022-07-25 DIAGNOSIS — R918 Other nonspecific abnormal finding of lung field: Secondary | ICD-10-CM | POA: Diagnosis not present

## 2022-07-25 DIAGNOSIS — C441122 Basal cell carcinoma of skin of right lower eyelid, including canthus: Secondary | ICD-10-CM

## 2022-07-25 DIAGNOSIS — Z85828 Personal history of other malignant neoplasm of skin: Secondary | ICD-10-CM | POA: Diagnosis not present

## 2022-07-25 LAB — CMP (CANCER CENTER ONLY)
ALT: 15 U/L (ref 0–44)
AST: 17 U/L (ref 15–41)
Albumin: 3.9 g/dL (ref 3.5–5.0)
Alkaline Phosphatase: 116 U/L (ref 38–126)
Anion gap: 5 (ref 5–15)
BUN: 15 mg/dL (ref 8–23)
CO2: 33 mmol/L — ABNORMAL HIGH (ref 22–32)
Calcium: 9.6 mg/dL (ref 8.9–10.3)
Chloride: 102 mmol/L (ref 98–111)
Creatinine: 1.05 mg/dL (ref 0.61–1.24)
GFR, Estimated: >60 mL/min (ref >60)
Glucose, Bld: 100 mg/dL — ABNORMAL HIGH (ref 70–99)
Potassium: 4.8 mmol/L (ref 3.5–5.1)
Sodium: 140 mmol/L (ref 135–145)
Total Bilirubin: 0.9 mg/dL (ref 0.3–1.2)
Total Protein: 7.2 g/dL (ref 6.5–8.1)

## 2022-07-25 LAB — CBC WITH DIFFERENTIAL (CANCER CENTER ONLY)
Abs Immature Granulocytes: 0.01 10*3/uL (ref 0.00–0.07)
Basophils Absolute: 0 10*3/uL (ref 0.0–0.1)
Basophils Relative: 1 %
Eosinophils Absolute: 0.1 10*3/uL (ref 0.0–0.5)
Eosinophils Relative: 3 %
HCT: 43.4 % (ref 39.0–52.0)
Hemoglobin: 13.8 g/dL (ref 13.0–17.0)
Immature Granulocytes: 0 %
Lymphocytes Relative: 26 %
Lymphs Abs: 1.4 10*3/uL (ref 0.7–4.0)
MCH: 29 pg (ref 26.0–34.0)
MCHC: 31.8 g/dL (ref 30.0–36.0)
MCV: 91.2 fL (ref 80.0–100.0)
Monocytes Absolute: 0.6 10*3/uL (ref 0.1–1.0)
Monocytes Relative: 11 %
Neutro Abs: 3.3 10*3/uL (ref 1.7–7.7)
Neutrophils Relative %: 59 %
Platelet Count: 222 10*3/uL (ref 150–400)
RBC: 4.76 MIL/uL (ref 4.22–5.81)
RDW: 12.4 % (ref 11.5–15.5)
WBC Count: 5.5 10*3/uL (ref 4.0–10.5)
nRBC: 0 % (ref 0.0–0.2)

## 2022-07-25 LAB — IRON AND IRON BINDING CAPACITY (CC-WL,HP ONLY)
Iron: 89 ug/dL (ref 45–182)
Saturation Ratios: 41 % — ABNORMAL HIGH (ref 17.9–39.5)
TIBC: 217 ug/dL — ABNORMAL LOW (ref 250–450)
UIBC: 128 ug/dL (ref 117–376)

## 2022-07-25 LAB — LACTATE DEHYDROGENASE: LDH: 133 U/L (ref 98–192)

## 2022-07-25 LAB — FERRITIN: Ferritin: 211 ng/mL (ref 24–336)

## 2022-07-25 NOTE — Progress Notes (Signed)
1 so present along working Hematology and Oncology Follow Up Visit  Clarence Hopkins [id number] 02-05-1931 89 y.o. 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 Iron deficiency anemia  Current Therapy:   S/p I-131 therapy on 08/27/2019 Vismodegib 150 mg po q day -- start on 12/01/2021 - on hold since 03/04/2022 Injectafer 750 mg IV - given on 02/08/2022     Interim History:  Mr. Haidet is back for follow-up.  He feels okay.  He did have a CT scan done today.  I am not sure exactly how to interpret the report.  It seems as if there might be some progression with respect to his thyroid cancer.  There may be some lymphangitic spread in the right lower lung.  Again, this is certainly somewhat debatable.  As far as his eyes concerned, this is doing okay.  He has been off the Vismodegib for about 5 months.  He is doing well with this.  I do not see any evidence of recurrence with respect to the basal cell carcinoma.  I do think he is going to be seeing Dr. Westley Foots soon.  Again he has had no cough.  There is no increased shortness of breath.  He does feel tired all the time.  His last iron studies that were done at see today showed a ferritin of 200 with iron saturation of 41%.  He has a decent appetite.  I will think he is lost weight.  He has had no rashes.  He has had no leg swelling.  He continues on Coumadin.  He is doing well on the Coumadin.  This is for his atrial fibrillation.  Currently, I would have said that his performance status is probably ECOG 2.   Medications:  Current Outpatient Medications:    finasteride (PROSCAR) 5 MG tablet, Take 1 tablet (5 mg total) by mouth daily. (Patient not taking: Reported on 06/13/2022), Disp: 90 tablet, Rfl: 3   levothyroxine (SYNTHROID) 200 MCG tablet, Take 1 tablet (200 mcg total) by mouth daily for a total of per day., Disp: 90 tablet, Rfl: 3    levothyroxine (SYNTHROID) 25 MCG tablet, Take 25 mcg by mouth daily. Take with 200 mcg tablet to total 225 mcg dose by Dr. Shawnee Knapp, Disp: , Rfl:    Polyethylene Glycol 3350 (MIRALAX PO), Take by mouth., Disp: , Rfl:    tamsulosin (FLOMAX) 0.4 MG CAPS capsule, Take 1 capsule (0.4 mg total) by mouth at bedtime. Need appointment for further refills., Disp: 30 capsule, Rfl: 0   warfarin (COUMADIN) 5 MG tablet, TAKE 1 TABLET BY MOUTH DAILY EXCEPT TAKE 1/2 TABLET ON MONDAYS AND FRIDAYS OR AS DIRECTED BY ANTICOAGULATION CLINIC (Patient not taking: Reported on 06/13/2022), Disp: 100 tablet, Rfl: 1  Allergies: No Known Allergies  Past Medical History, Surgical history, Social history, and Family History were reviewed and updated.  Review of Systems: Review of Systems  Constitutional: Negative.   HENT:  Negative.    Eyes: Negative.   Respiratory: Negative.    Cardiovascular: Negative.   Gastrointestinal: Negative.   Endocrine: Negative.   Genitourinary: Negative.    Musculoskeletal: Negative.   Skin: Negative.   Neurological: Negative.   Hematological: Negative.   Psychiatric/Behavioral: Negative.      Physical Exam: His vital signs show a temperature of 97.7.  Pulse 70.  Blood pressure 121/79.  Weight is 183 pounds.  Wt Readings from Last 3 Encounters:  07/25/22 182 lb (82.6 kg)  06/13/22 185 lb 8 oz (84.1 kg)  05/09/22 183 lb (83 kg)    Physical Exam Vitals reviewed.  HENT:     Head: Normocephalic and atraumatic.     Comments: The area under his right eye looks quite good.  I really do not see any obvious lesion.  There may be a little bit of fullness.  He has good extraocular muscle movement.   Eyes:     Pupils: Pupils are equal, round, and reactive to light.  Cardiovascular:     Heart sounds: Normal heart sounds.     Comments: Cardiac exam is slow but regular.  He does have occasional extra beats.  He does have an occasional skipped beat.  He has 1/6 systolic ejection  murmur. Pulmonary:     Effort: Pulmonary effort is normal.     Breath sounds: Normal breath sounds.     Comments: His lungs sound clear bilaterally.  He has good air movement bilaterally.  I do not hear any wheezing. Abdominal:     General: Bowel sounds are normal.     Palpations: Abdomen is soft.  Musculoskeletal:        General: No tenderness or deformity. Normal range of motion.     Cervical back: Normal range of motion.  Lymphadenopathy:     Cervical: No cervical adenopathy.  Skin:    General: Skin is warm and dry.     Findings: No erythema or rash.  Neurological:     Mental Status: He is alert and oriented to person, place, and time.  Psychiatric:        Behavior: Behavior normal.        Thought Content: Thought content normal.        Judgment: Judgment normal.      Lab Results  Component Value Date   WBC 5.5 07/25/2022   HGB 13.8 07/25/2022   HCT 43.4 07/25/2022   MCV 91.2 07/25/2022   PLT 222 07/25/2022     Chemistry      Component Value Date/Time   NA 136 06/13/2022 0910   NA 142 11/29/2020 0000   K 4.8 06/13/2022 0910   CL 102 06/13/2022 0910   CO2 31 06/13/2022 0910   BUN 15 06/13/2022 0910   BUN 19 11/29/2020 0000   CREATININE 1.06 06/13/2022 0910   GLU 95 11/29/2020 0000      Component Value Date/Time   CALCIUM 9.4 06/13/2022 0910   ALKPHOS 106 06/13/2022 0910   AST 19 06/13/2022 0910   ALT 15 06/13/2022 0910   BILITOT 0.7 06/13/2022 0910      Impression and Plan: Mr. Dubray is a very nice 89 year old white male.  He has metastatic thyroid cancer.  This is a differentiated thyroid cancer.  Thankfully it has been responsive to the radioactive iodine.   By the CT scan, I am little worried that he may not be as responsive right now.  I do think that were probably going to have to get another CAT scan on sooner than later.  Again I am not sure exactly if there is true progression.  I probably will have to get another CAT scan on him in about a month  or so.  I know that he may not have the greatest performance status.  As such, if we have to treat the thyroid cancer, we will have to be cautious as to how we treat this.  I realize this is somewhat tricky.  I know he does have an endocrinologist that helps with his thyroid cancer.  I may have to talk to him about the CT report.    I would like to get Mr. Balent back in about a month or so.  Again we probably will have to do a CT scan when I see him back.  Josph Macho, MD 6/11/20248:53 AM

## 2022-07-26 ENCOUNTER — Inpatient Hospital Stay: Payer: Medicare Other

## 2022-07-26 ENCOUNTER — Telehealth (HOSPITAL_BASED_OUTPATIENT_CLINIC_OR_DEPARTMENT_OTHER): Payer: Self-pay

## 2022-07-26 ENCOUNTER — Ambulatory Visit: Payer: Medicare Other | Admitting: Hematology & Oncology

## 2022-07-27 DIAGNOSIS — Z85828 Personal history of other malignant neoplasm of skin: Secondary | ICD-10-CM | POA: Diagnosis not present

## 2022-07-27 DIAGNOSIS — C44311 Basal cell carcinoma of skin of nose: Secondary | ICD-10-CM | POA: Diagnosis not present

## 2022-07-27 DIAGNOSIS — L812 Freckles: Secondary | ICD-10-CM | POA: Diagnosis not present

## 2022-07-27 DIAGNOSIS — D485 Neoplasm of uncertain behavior of skin: Secondary | ICD-10-CM | POA: Diagnosis not present

## 2022-07-27 DIAGNOSIS — Z85820 Personal history of malignant melanoma of skin: Secondary | ICD-10-CM | POA: Diagnosis not present

## 2022-07-27 DIAGNOSIS — L570 Actinic keratosis: Secondary | ICD-10-CM | POA: Diagnosis not present

## 2022-07-27 DIAGNOSIS — L821 Other seborrheic keratosis: Secondary | ICD-10-CM | POA: Diagnosis not present

## 2022-07-27 DIAGNOSIS — C44719 Basal cell carcinoma of skin of left lower limb, including hip: Secondary | ICD-10-CM | POA: Diagnosis not present

## 2022-07-27 DIAGNOSIS — D1801 Hemangioma of skin and subcutaneous tissue: Secondary | ICD-10-CM | POA: Diagnosis not present

## 2022-08-01 ENCOUNTER — Ambulatory Visit: Payer: Medicare Other

## 2022-08-01 ENCOUNTER — Telehealth: Payer: Self-pay

## 2022-08-01 NOTE — Telephone Encounter (Signed)
Pt LVM today reporting he will not be able to make his coumadin clinic apt today due to a family medical emergency. He will call tomorrow to RS his apt.

## 2022-08-04 ENCOUNTER — Other Ambulatory Visit (HOSPITAL_BASED_OUTPATIENT_CLINIC_OR_DEPARTMENT_OTHER): Payer: Self-pay

## 2022-08-04 ENCOUNTER — Other Ambulatory Visit: Payer: Self-pay | Admitting: Internal Medicine

## 2022-08-07 ENCOUNTER — Other Ambulatory Visit (HOSPITAL_BASED_OUTPATIENT_CLINIC_OR_DEPARTMENT_OTHER): Payer: Self-pay

## 2022-08-07 MED ORDER — TAMSULOSIN HCL 0.4 MG PO CAPS
0.4000 mg | ORAL_CAPSULE | Freq: Every day | ORAL | 0 refills | Status: DC
  Filled 2022-08-07: qty 30, 30d supply, fill #0

## 2022-08-08 ENCOUNTER — Ambulatory Visit (INDEPENDENT_AMBULATORY_CARE_PROVIDER_SITE_OTHER): Payer: Medicare Other

## 2022-08-08 DIAGNOSIS — Z7901 Long term (current) use of anticoagulants: Secondary | ICD-10-CM | POA: Diagnosis not present

## 2022-08-08 LAB — POCT INR: INR: 3.3 — AB (ref 2.0–3.0)

## 2022-08-08 NOTE — Progress Notes (Addendum)
Decrease dose today to take 1/2 tablet and the continue 1 tablet daily.  Recheck in 2 weeks.    Medical screening examination/treatment/procedure(s) were performed by non-physician practitioner and as supervising physician I was immediately available for consultation/collaboration.  I agree with above. Jacinta Shoe, MD

## 2022-08-08 NOTE — Patient Instructions (Addendum)
Pre visit review using our clinic review tool, if applicable. No additional management support is needed unless otherwise documented below in the visit note.  Decrease dose today to take 1/2 tablet and the continue 1 tablet daily.  Recheck in 2 weeks.

## 2022-08-12 ENCOUNTER — Other Ambulatory Visit (HOSPITAL_BASED_OUTPATIENT_CLINIC_OR_DEPARTMENT_OTHER): Payer: Self-pay

## 2022-08-25 ENCOUNTER — Ambulatory Visit: Payer: Medicare Other

## 2022-08-28 ENCOUNTER — Telehealth: Payer: Self-pay

## 2022-08-28 NOTE — Telephone Encounter (Signed)
Pt NS coumadin clinic apt on 7/12.  LVM for pt to return call.

## 2022-08-31 ENCOUNTER — Inpatient Hospital Stay: Payer: Medicare Other | Attending: Hematology & Oncology

## 2022-08-31 ENCOUNTER — Inpatient Hospital Stay (HOSPITAL_BASED_OUTPATIENT_CLINIC_OR_DEPARTMENT_OTHER): Payer: Medicare Other | Admitting: Hematology & Oncology

## 2022-08-31 ENCOUNTER — Encounter: Payer: Self-pay | Admitting: Hematology & Oncology

## 2022-08-31 VITALS — BP 127/64 | HR 66 | Temp 97.9°F | Resp 20 | Ht 75.0 in | Wt 185.0 lb

## 2022-08-31 DIAGNOSIS — C441122 Basal cell carcinoma of skin of right lower eyelid, including canthus: Secondary | ICD-10-CM

## 2022-08-31 DIAGNOSIS — D500 Iron deficiency anemia secondary to blood loss (chronic): Secondary | ICD-10-CM

## 2022-08-31 DIAGNOSIS — D509 Iron deficiency anemia, unspecified: Secondary | ICD-10-CM | POA: Diagnosis not present

## 2022-08-31 DIAGNOSIS — C73 Malignant neoplasm of thyroid gland: Secondary | ICD-10-CM

## 2022-08-31 LAB — CBC WITH DIFFERENTIAL (CANCER CENTER ONLY)
Abs Immature Granulocytes: 0.02 10*3/uL (ref 0.00–0.07)
Basophils Absolute: 0 10*3/uL (ref 0.0–0.1)
Basophils Relative: 0 %
Eosinophils Absolute: 0.1 10*3/uL (ref 0.0–0.5)
Eosinophils Relative: 3 %
HCT: 41.8 % (ref 39.0–52.0)
Hemoglobin: 13.2 g/dL (ref 13.0–17.0)
Immature Granulocytes: 0 %
Lymphocytes Relative: 27 %
Lymphs Abs: 1.3 10*3/uL (ref 0.7–4.0)
MCH: 28.6 pg (ref 26.0–34.0)
MCHC: 31.6 g/dL (ref 30.0–36.0)
MCV: 90.5 fL (ref 80.0–100.0)
Monocytes Absolute: 0.5 10*3/uL (ref 0.1–1.0)
Monocytes Relative: 11 %
Neutro Abs: 2.8 10*3/uL (ref 1.7–7.7)
Neutrophils Relative %: 59 %
Platelet Count: 203 10*3/uL (ref 150–400)
RBC: 4.62 MIL/uL (ref 4.22–5.81)
RDW: 12.6 % (ref 11.5–15.5)
WBC Count: 4.9 10*3/uL (ref 4.0–10.5)
nRBC: 0 % (ref 0.0–0.2)

## 2022-08-31 LAB — RETICULOCYTES
Immature Retic Fract: 6.4 % (ref 2.3–15.9)
RBC.: 4.56 MIL/uL (ref 4.22–5.81)
Retic Count, Absolute: 37.4 10*3/uL (ref 19.0–186.0)
Retic Ct Pct: 0.8 % (ref 0.4–3.1)

## 2022-08-31 LAB — CMP (CANCER CENTER ONLY)
ALT: 15 U/L (ref 0–44)
AST: 19 U/L (ref 15–41)
Albumin: 4 g/dL (ref 3.5–5.0)
Alkaline Phosphatase: 109 U/L (ref 38–126)
Anion gap: 3 — ABNORMAL LOW (ref 5–15)
BUN: 18 mg/dL (ref 8–23)
CO2: 32 mmol/L (ref 22–32)
Calcium: 9.6 mg/dL (ref 8.9–10.3)
Chloride: 103 mmol/L (ref 98–111)
Creatinine: 0.99 mg/dL (ref 0.61–1.24)
GFR, Estimated: >60 mL/min (ref >60)
Glucose, Bld: 81 mg/dL (ref 70–99)
Potassium: 4.8 mmol/L (ref 3.5–5.1)
Sodium: 138 mmol/L (ref 135–145)
Total Bilirubin: 0.7 mg/dL (ref 0.3–1.2)
Total Protein: 7.1 g/dL (ref 6.5–8.1)

## 2022-08-31 LAB — IRON AND IRON BINDING CAPACITY (CC-WL,HP ONLY)
Iron: 74 ug/dL (ref 45–182)
Saturation Ratios: 34 % (ref 17.9–39.5)
TIBC: 218 ug/dL — ABNORMAL LOW (ref 250–450)
UIBC: 144 ug/dL (ref 117–376)

## 2022-08-31 LAB — FERRITIN: Ferritin: 186 ng/mL (ref 24–336)

## 2022-08-31 LAB — LACTATE DEHYDROGENASE: LDH: 129 U/L (ref 98–192)

## 2022-08-31 NOTE — Telephone Encounter (Signed)
LVM

## 2022-08-31 NOTE — Progress Notes (Signed)
1 so present along working Hematology and Oncology Follow Up Visit  Clarence Hopkins [id number] Sep 28, 1930 89 y.o. 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 Iron deficiency anemia  Current Therapy:   S/p I-131 therapy on 08/27/2019 Vismodegib 150 mg po q day -- start on 12/01/2021 - on hold since 03/04/2022 Injectafer 750 mg IV - given on 02/08/2022     Interim History:  Clarence Hopkins is back for follow-up.  He feels okay.  Unfortunately, his poor wife is not doing too well.  She is having a lot of bursitis and hip pain.  She does see an orthopedist for this.  When we last saw him, he did have the CAT scan done.  This was somewhat hard to interpret.  There is no obvious change in his lung nodules.  However, there was some evidence of possible lymphangitic spread of tumor in the right lower lobe.  Again, I am unsure exactly what this signifies.  We will have to follow-up on this with another CT scan.  Under his right eye, this looks fantastic.  He has been off the Vismodegib for several months.  There is no evidence of any type of recurrence under the right eye.  He has had no nausea or vomiting.  He has had no bleeding.  He is on Coumadin because of atrial fibrillation.  He does see an endocrinologist for the actual thyroid cancer.  Currently, I would say that his performance status is probably ECOG 2.    Medications:  Current Outpatient Medications:    Ibuprofen-diphenhydrAMINE Cit (ADVIL PM PO), Take by mouth at bedtime as needed., Disp: , Rfl:    levothyroxine (SYNTHROID) 200 MCG tablet, Take 1 tablet (200 mcg total) by mouth daily for a total of per day., Disp: 90 tablet, Rfl: 3   levothyroxine (SYNTHROID) 25 MCG tablet, Take 25 mcg by mouth daily. Take with 200 mcg tablet to total 225 mcg dose by Dr. Shawnee Knapp, Disp: , Rfl:    Polyethylene Glycol 3350 (MIRALAX PO), Take by mouth daily., Disp: , Rfl:     tamsulosin (FLOMAX) 0.4 MG CAPS capsule, Take 1 capsule (0.4 mg total) by mouth at bedtime., Disp: 30 capsule, Rfl: 0   warfarin (COUMADIN) 5 MG tablet, TAKE 1 TABLET BY MOUTH DAILY EXCEPT TAKE 1/2 TABLET ON MONDAYS AND FRIDAYS OR AS DIRECTED BY ANTICOAGULATION CLINIC, Disp: 100 tablet, Rfl: 1   finasteride (PROSCAR) 5 MG tablet, Take 1 tablet (5 mg total) by mouth daily. (Patient not taking: Reported on 06/13/2022), Disp: 90 tablet, Rfl: 3  Allergies: No Known Allergies  Past Medical History, Surgical history, Social history, and Family History were reviewed and updated.  Review of Systems: Review of Systems  Constitutional: Negative.   HENT:  Negative.    Eyes: Negative.   Respiratory: Negative.    Cardiovascular: Negative.   Gastrointestinal: Negative.   Endocrine: Negative.   Genitourinary: Negative.    Musculoskeletal: Negative.   Skin: Negative.   Neurological: Negative.   Hematological: Negative.   Psychiatric/Behavioral: Negative.      Physical Exam: His vital signs show a temperature of 97.7.  Pulse 70.  Blood pressure 121/79.  Weight is 185 pounds.  Wt Readings from Last 3 Encounters:  08/31/22 185 lb (83.9 kg)  07/25/22 182 lb (82.6 kg)  06/13/22 185 lb 8 oz (84.1 kg)    Physical Exam Vitals reviewed.  HENT:     Head:  Normocephalic and atraumatic.     Comments: The area under his right eye looks quite good.  I really do not see any obvious lesion.  There may be a little bit of fullness.  He has good extraocular muscle movement.   Eyes:     Pupils: Pupils are equal, round, and reactive to light.  Cardiovascular:     Heart sounds: Normal heart sounds.     Comments: Cardiac exam is slow but regular.  He does have occasional extra beats.  He does have an occasional skipped beat.  He has 1/6 systolic ejection murmur. Pulmonary:     Effort: Pulmonary effort is normal.     Breath sounds: Normal breath sounds.     Comments: His lungs sound clear bilaterally.  He has  good air movement bilaterally.  I do not hear any wheezing. Abdominal:     General: Bowel sounds are normal.     Palpations: Abdomen is soft.  Musculoskeletal:        General: No tenderness or deformity. Normal range of motion.     Cervical back: Normal range of motion.  Lymphadenopathy:     Cervical: No cervical adenopathy.  Skin:    General: Skin is warm and dry.     Findings: No erythema or rash.  Neurological:     Mental Status: He is alert and oriented to person, place, and time.  Psychiatric:        Behavior: Behavior normal.        Thought Content: Thought content normal.        Judgment: Judgment normal.     Lab Results  Component Value Date   WBC 4.9 08/31/2022   HGB 13.2 08/31/2022   HCT 41.8 08/31/2022   MCV 90.5 08/31/2022   PLT 203 08/31/2022     Chemistry      Component Value Date/Time   NA 138 08/31/2022 0856   NA 142 11/29/2020 0000   K 4.8 08/31/2022 0856   CL 103 08/31/2022 0856   CO2 32 08/31/2022 0856   BUN 18 08/31/2022 0856   BUN 19 11/29/2020 0000   CREATININE 0.99 08/31/2022 0856   GLU 95 11/29/2020 0000      Component Value Date/Time   CALCIUM 9.6 08/31/2022 0856   ALKPHOS 109 08/31/2022 0856   AST 19 08/31/2022 0856   ALT 15 08/31/2022 0856   BILITOT 0.7 08/31/2022 0856      Impression and Plan: Clarence Hopkins is a very nice 89 year old white male.  He has metastatic thyroid cancer.  This is a differentiated thyroid cancer.  Thankfully it has been responsive to the radioactive iodine.   By the CT scan, I am little worried that he may not be as responsive right now.  I do think that were probably going to have to get another CAT scan.  Again I am not sure exactly if there is true progression.  I probably will have to get another CAT scan on him in about a month or so.  I know that he may not have the greatest performance status.  As such, if we have to treat the thyroid cancer, we will have to be cautious as to how we treat this.  I  realize this is somewhat tricky.  I know he does have an endocrinologist that helps with his thyroid cancer.   I will plan to get him back in about 6 weeks or so.  Josph Macho, MD 7/18/202410:15 AM

## 2022-09-08 NOTE — Telephone Encounter (Signed)
LVM

## 2022-09-15 NOTE — Telephone Encounter (Signed)
LVM

## 2022-09-20 DIAGNOSIS — H0100A Unspecified blepharitis right eye, upper and lower eyelids: Secondary | ICD-10-CM | POA: Diagnosis not present

## 2022-09-20 DIAGNOSIS — H524 Presbyopia: Secondary | ICD-10-CM | POA: Diagnosis not present

## 2022-09-20 DIAGNOSIS — Z961 Presence of intraocular lens: Secondary | ICD-10-CM | POA: Diagnosis not present

## 2022-09-20 DIAGNOSIS — H5203 Hypermetropia, bilateral: Secondary | ICD-10-CM | POA: Diagnosis not present

## 2022-09-20 DIAGNOSIS — H04123 Dry eye syndrome of bilateral lacrimal glands: Secondary | ICD-10-CM | POA: Diagnosis not present

## 2022-09-20 DIAGNOSIS — H26493 Other secondary cataract, bilateral: Secondary | ICD-10-CM | POA: Diagnosis not present

## 2022-09-20 DIAGNOSIS — H43813 Vitreous degeneration, bilateral: Secondary | ICD-10-CM | POA: Diagnosis not present

## 2022-09-20 DIAGNOSIS — H0100B Unspecified blepharitis left eye, upper and lower eyelids: Secondary | ICD-10-CM | POA: Diagnosis not present

## 2022-09-21 NOTE — Telephone Encounter (Signed)
Contacted pt and RS coumadin clinic apt that was missed for 8/16. Pt verbalized understanding.

## 2022-09-28 DIAGNOSIS — L570 Actinic keratosis: Secondary | ICD-10-CM | POA: Diagnosis not present

## 2022-09-28 DIAGNOSIS — Z85828 Personal history of other malignant neoplasm of skin: Secondary | ICD-10-CM | POA: Diagnosis not present

## 2022-09-28 DIAGNOSIS — D485 Neoplasm of uncertain behavior of skin: Secondary | ICD-10-CM | POA: Diagnosis not present

## 2022-09-28 DIAGNOSIS — D692 Other nonthrombocytopenic purpura: Secondary | ICD-10-CM | POA: Diagnosis not present

## 2022-09-28 DIAGNOSIS — C44311 Basal cell carcinoma of skin of nose: Secondary | ICD-10-CM | POA: Diagnosis not present

## 2022-09-28 DIAGNOSIS — L858 Other specified epidermal thickening: Secondary | ICD-10-CM | POA: Diagnosis not present

## 2022-09-28 DIAGNOSIS — D1801 Hemangioma of skin and subcutaneous tissue: Secondary | ICD-10-CM | POA: Diagnosis not present

## 2022-09-28 DIAGNOSIS — Z85820 Personal history of malignant melanoma of skin: Secondary | ICD-10-CM | POA: Diagnosis not present

## 2022-09-28 DIAGNOSIS — L821 Other seborrheic keratosis: Secondary | ICD-10-CM | POA: Diagnosis not present

## 2022-09-29 ENCOUNTER — Ambulatory Visit: Payer: Medicare Other

## 2022-09-29 DIAGNOSIS — Z7901 Long term (current) use of anticoagulants: Secondary | ICD-10-CM | POA: Diagnosis not present

## 2022-09-29 LAB — POCT INR: INR: 2.1 (ref 2.0–3.0)

## 2022-09-29 NOTE — Patient Instructions (Addendum)
Pre visit review using our clinic review tool, if applicable. No additional management support is needed unless otherwise documented below in the visit note.  Continue 1 tablet daily . Recheck in 4 weeks.  

## 2022-09-29 NOTE — Progress Notes (Signed)
Continue 1 tablet daily. Recheck in 4 weeks.   

## 2022-10-03 DIAGNOSIS — H26492 Other secondary cataract, left eye: Secondary | ICD-10-CM | POA: Diagnosis not present

## 2022-10-04 DIAGNOSIS — E890 Postprocedural hypothyroidism: Secondary | ICD-10-CM | POA: Diagnosis not present

## 2022-10-10 DIAGNOSIS — H26491 Other secondary cataract, right eye: Secondary | ICD-10-CM | POA: Diagnosis not present

## 2022-10-17 ENCOUNTER — Other Ambulatory Visit (HOSPITAL_BASED_OUTPATIENT_CLINIC_OR_DEPARTMENT_OTHER): Payer: Self-pay

## 2022-10-17 ENCOUNTER — Other Ambulatory Visit: Payer: Self-pay | Admitting: Internal Medicine

## 2022-10-17 ENCOUNTER — Other Ambulatory Visit: Payer: Self-pay

## 2022-10-18 ENCOUNTER — Other Ambulatory Visit (HOSPITAL_BASED_OUTPATIENT_CLINIC_OR_DEPARTMENT_OTHER): Payer: Self-pay

## 2022-10-19 ENCOUNTER — Inpatient Hospital Stay: Payer: Medicare Other | Attending: Hematology & Oncology | Admitting: Hematology & Oncology

## 2022-10-19 ENCOUNTER — Ambulatory Visit (HOSPITAL_BASED_OUTPATIENT_CLINIC_OR_DEPARTMENT_OTHER): Admission: RE | Admit: 2022-10-19 | Payer: Medicare Other | Source: Ambulatory Visit

## 2022-10-19 ENCOUNTER — Inpatient Hospital Stay: Payer: Medicare Other

## 2022-10-20 ENCOUNTER — Other Ambulatory Visit (HOSPITAL_BASED_OUTPATIENT_CLINIC_OR_DEPARTMENT_OTHER): Payer: Self-pay

## 2022-10-20 DIAGNOSIS — Z23 Encounter for immunization: Secondary | ICD-10-CM | POA: Diagnosis not present

## 2022-10-27 ENCOUNTER — Telehealth (HOSPITAL_BASED_OUTPATIENT_CLINIC_OR_DEPARTMENT_OTHER): Payer: Self-pay

## 2022-10-31 ENCOUNTER — Ambulatory Visit (INDEPENDENT_AMBULATORY_CARE_PROVIDER_SITE_OTHER): Payer: Medicare Other

## 2022-10-31 DIAGNOSIS — Z7901 Long term (current) use of anticoagulants: Secondary | ICD-10-CM | POA: Diagnosis not present

## 2022-10-31 LAB — POCT INR: INR: 1.5 — AB (ref 2.0–3.0)

## 2022-10-31 NOTE — Patient Instructions (Addendum)
Pre visit review using our clinic review tool, if applicable. No additional management support is needed unless otherwise documented below in the visit note.  Increase dose today to take 1 1/2 tablets and increase dose tomorrow to take 1 1/2 tablet and then continue 1 tablet daily.  Recheck in 2 weeks.

## 2022-10-31 NOTE — Progress Notes (Addendum)
Pt denies any changes. Increase dose today to take 1 1/2 tablets and increase dose tomorrow to take 1 1/2 tablet and then continue 1 tablet daily.  Recheck in 2 weeks.   Medical screening examination/treatment/procedure(s) were performed by non-physician practitioner and as supervising physician I was immediately available for consultation/collaboration.  I agree with above. Jacinta Shoe, MD

## 2022-11-08 ENCOUNTER — Encounter (HOSPITAL_BASED_OUTPATIENT_CLINIC_OR_DEPARTMENT_OTHER): Payer: Self-pay

## 2022-11-08 ENCOUNTER — Ambulatory Visit (HOSPITAL_BASED_OUTPATIENT_CLINIC_OR_DEPARTMENT_OTHER)
Admission: RE | Admit: 2022-11-08 | Discharge: 2022-11-08 | Disposition: A | Discharge status: Home / Self Care | Payer: Medicare Other | Source: Ambulatory Visit | Attending: Hematology & Oncology | Admitting: Hematology & Oncology

## 2022-11-08 DIAGNOSIS — R918 Other nonspecific abnormal finding of lung field: Secondary | ICD-10-CM | POA: Diagnosis not present

## 2022-11-08 DIAGNOSIS — C73 Malignant neoplasm of thyroid gland: Secondary | ICD-10-CM | POA: Insufficient documentation

## 2022-11-08 DIAGNOSIS — C801 Malignant (primary) neoplasm, unspecified: Secondary | ICD-10-CM | POA: Diagnosis not present

## 2022-11-08 MED ORDER — IOHEXOL 300 MG/ML  SOLN
100.0000 mL | Freq: Once | INTRAMUSCULAR | Status: AC | PRN
  Administered 2022-11-08: 75 mL via INTRAVENOUS

## 2022-11-16 DIAGNOSIS — Z23 Encounter for immunization: Secondary | ICD-10-CM | POA: Diagnosis not present

## 2022-11-17 ENCOUNTER — Ambulatory Visit (INDEPENDENT_AMBULATORY_CARE_PROVIDER_SITE_OTHER): Payer: Medicare Other

## 2022-11-17 DIAGNOSIS — Z7901 Long term (current) use of anticoagulants: Secondary | ICD-10-CM

## 2022-11-17 LAB — POCT INR: INR: 2.1 (ref 2.0–3.0)

## 2022-11-17 NOTE — Progress Notes (Signed)
Continue 1 tablet daily. Recheck in 4 weeks.   

## 2022-11-17 NOTE — Patient Instructions (Addendum)
Pre visit review using our clinic review tool, if applicable. No additional management support is needed unless otherwise documented below in the visit note.  Continue 1 tablet daily . Recheck in 4 weeks.  

## 2022-11-30 DIAGNOSIS — D485 Neoplasm of uncertain behavior of skin: Secondary | ICD-10-CM | POA: Diagnosis not present

## 2022-11-30 DIAGNOSIS — D1801 Hemangioma of skin and subcutaneous tissue: Secondary | ICD-10-CM | POA: Diagnosis not present

## 2022-11-30 DIAGNOSIS — C44712 Basal cell carcinoma of skin of right lower limb, including hip: Secondary | ICD-10-CM | POA: Diagnosis not present

## 2022-11-30 DIAGNOSIS — L821 Other seborrheic keratosis: Secondary | ICD-10-CM | POA: Diagnosis not present

## 2022-11-30 DIAGNOSIS — Z85828 Personal history of other malignant neoplasm of skin: Secondary | ICD-10-CM | POA: Diagnosis not present

## 2022-11-30 DIAGNOSIS — Z85820 Personal history of malignant melanoma of skin: Secondary | ICD-10-CM | POA: Diagnosis not present

## 2022-11-30 DIAGNOSIS — L111 Transient acantholytic dermatosis [Grover]: Secondary | ICD-10-CM | POA: Diagnosis not present

## 2022-12-13 ENCOUNTER — Telehealth: Payer: Self-pay | Admitting: Internal Medicine

## 2022-12-13 NOTE — Telephone Encounter (Signed)
Received surgical clearance in fax from [facility], form will be in providers box up front.

## 2022-12-15 ENCOUNTER — Ambulatory Visit (INDEPENDENT_AMBULATORY_CARE_PROVIDER_SITE_OTHER): Payer: Medicare Other

## 2022-12-15 ENCOUNTER — Other Ambulatory Visit (HOSPITAL_BASED_OUTPATIENT_CLINIC_OR_DEPARTMENT_OTHER): Payer: Self-pay

## 2022-12-15 DIAGNOSIS — Z7901 Long term (current) use of anticoagulants: Secondary | ICD-10-CM

## 2022-12-15 LAB — POCT INR: INR: 2.3 (ref 2.0–3.0)

## 2022-12-15 MED ORDER — WARFARIN SODIUM 5 MG PO TABS
5.0000 mg | ORAL_TABLET | Freq: Every day | ORAL | 1 refills | Status: DC
  Filled 2022-12-15: qty 105, 90d supply, fill #0
  Filled 2023-02-22: qty 90, 90d supply, fill #0

## 2022-12-15 NOTE — Progress Notes (Addendum)
Continue 1 tablet daily.  Recheck in 5 weeks.

## 2022-12-15 NOTE — Patient Instructions (Addendum)
Pre visit review using our clinic review tool, if applicable. No additional management support is needed unless otherwise documented below in the visit note.  Continue 1 tablet daily.  Re-check in 5 weeks.

## 2022-12-18 ENCOUNTER — Ambulatory Visit: Payer: Medicare Other | Admitting: Internal Medicine

## 2022-12-27 ENCOUNTER — Ambulatory Visit: Payer: Medicare Other | Admitting: Internal Medicine

## 2022-12-27 DIAGNOSIS — C73 Malignant neoplasm of thyroid gland: Secondary | ICD-10-CM | POA: Diagnosis not present

## 2022-12-27 DIAGNOSIS — C799 Secondary malignant neoplasm of unspecified site: Secondary | ICD-10-CM | POA: Diagnosis not present

## 2022-12-27 DIAGNOSIS — E890 Postprocedural hypothyroidism: Secondary | ICD-10-CM | POA: Diagnosis not present

## 2022-12-28 DIAGNOSIS — H0279 Other degenerative disorders of eyelid and periocular area: Secondary | ICD-10-CM | POA: Diagnosis not present

## 2022-12-28 DIAGNOSIS — H02532 Eyelid retraction right lower eyelid: Secondary | ICD-10-CM | POA: Diagnosis not present

## 2022-12-28 DIAGNOSIS — Z961 Presence of intraocular lens: Secondary | ICD-10-CM | POA: Diagnosis not present

## 2022-12-28 DIAGNOSIS — C441122 Basal cell carcinoma of skin of right lower eyelid, including canthus: Secondary | ICD-10-CM | POA: Diagnosis not present

## 2022-12-28 DIAGNOSIS — H16211 Exposure keratoconjunctivitis, right eye: Secondary | ICD-10-CM | POA: Diagnosis not present

## 2023-01-02 ENCOUNTER — Telehealth: Payer: Self-pay | Admitting: Hematology & Oncology

## 2023-01-02 NOTE — Telephone Encounter (Signed)
Called pt to attempt to schedule missed appointments. Unable to reach pt on cell or home. LVM on both numbers.

## 2023-01-05 ENCOUNTER — Telehealth: Payer: Self-pay | Admitting: Hematology & Oncology

## 2023-01-05 NOTE — Telephone Encounter (Signed)
Called pt for scheduling. Unable to reach pt on home or cell. LVM on both.

## 2023-01-09 ENCOUNTER — Telehealth: Payer: Self-pay | Admitting: Hematology & Oncology

## 2023-01-09 NOTE — Telephone Encounter (Signed)
Called both cell and home in an attempt to schedule pt for follow up appt. Unable to reach. LVM on home phone.

## 2023-01-19 ENCOUNTER — Ambulatory Visit (INDEPENDENT_AMBULATORY_CARE_PROVIDER_SITE_OTHER): Payer: Medicare Other

## 2023-01-19 DIAGNOSIS — Z7901 Long term (current) use of anticoagulants: Secondary | ICD-10-CM

## 2023-01-19 LAB — POCT INR: INR: 1.3 — AB (ref 2.0–3.0)

## 2023-01-19 NOTE — Patient Instructions (Addendum)
Pre visit review using our clinic review tool, if applicable. No additional management support is needed unless otherwise documented below in the visit note.  Increase dose today and tomorrow to take 1 1/2 tablets and then continue 1 tablet daily.  Recheck in 2 weeks, on 12/23, at [place].

## 2023-01-19 NOTE — Progress Notes (Addendum)
Pt's wife has been in [place] due to illness. She takes care of pt's medications so he missed the last 3 days doses of warfarin.  Pt was stable on current dose recently so no long term change will be made. Increase dose today and tomorrow to take 1 1/2 tablets and then continue 1 tablet daily.  Recheck in 2 weeks at [place].

## 2023-01-22 ENCOUNTER — Telehealth: Payer: Self-pay | Admitting: Hematology & Oncology

## 2023-01-22 NOTE — Telephone Encounter (Signed)
Called to schedule appts per inbasket req. LVM to return call for schduling.

## 2023-02-01 DIAGNOSIS — L570 Actinic keratosis: Secondary | ICD-10-CM | POA: Diagnosis not present

## 2023-02-01 DIAGNOSIS — Z85828 Personal history of other malignant neoplasm of skin: Secondary | ICD-10-CM | POA: Diagnosis not present

## 2023-02-01 DIAGNOSIS — Z85820 Personal history of malignant melanoma of skin: Secondary | ICD-10-CM | POA: Diagnosis not present

## 2023-02-01 DIAGNOSIS — L821 Other seborrheic keratosis: Secondary | ICD-10-CM | POA: Diagnosis not present

## 2023-02-01 DIAGNOSIS — D1801 Hemangioma of skin and subcutaneous tissue: Secondary | ICD-10-CM | POA: Diagnosis not present

## 2023-02-05 ENCOUNTER — Ambulatory Visit (INDEPENDENT_AMBULATORY_CARE_PROVIDER_SITE_OTHER): Payer: Medicare Other

## 2023-02-05 ENCOUNTER — Telehealth: Payer: Self-pay

## 2023-02-05 DIAGNOSIS — Z7901 Long term (current) use of anticoagulants: Secondary | ICD-10-CM | POA: Diagnosis not present

## 2023-02-05 LAB — POCT INR: INR: 1.6 — AB (ref 2.0–3.0)

## 2023-02-05 NOTE — Telephone Encounter (Signed)
Pt missed coumadin clinic apt this morning. Contacted pt and he agreed to reschedule apt for this afternoon.

## 2023-02-05 NOTE — Progress Notes (Signed)
Pt's wife has been in [place] due to illness. She used to take care of pt's medications. Pt is having difficulty taking care of medication on his own. He reports he missed 1/2 tablet 2 weeks ago. May have missed more doses. Increase dose today and tomorrow to take 1 1/2 tablets and then change weekly dose to take 1 tablet daily except take 1 1/2 tablets on Saturday.  Recheck in 2 weeks at [facility].

## 2023-02-05 NOTE — Patient Instructions (Addendum)
Pre visit review using our clinic review tool, if applicable. No additional management support is needed unless otherwise documented below in the visit note.  Increase dose today and tomorrow to take 1 1/2 tablets and then change weekly dose to take 1 tablet daily except take 1 1/2 tablets on Saturday.  Recheck in 2 weeks at [facility].

## 2023-02-08 ENCOUNTER — Inpatient Hospital Stay: Payer: Medicare Other | Attending: Hematology & Oncology

## 2023-02-08 ENCOUNTER — Inpatient Hospital Stay (HOSPITAL_BASED_OUTPATIENT_CLINIC_OR_DEPARTMENT_OTHER): Payer: Medicare Other | Admitting: Hematology & Oncology

## 2023-02-08 ENCOUNTER — Ambulatory Visit (HOSPITAL_BASED_OUTPATIENT_CLINIC_OR_DEPARTMENT_OTHER)
Admission: RE | Admit: 2023-02-08 | Discharge: 2023-02-08 | Disposition: A | Discharge status: Home / Self Care | Payer: Medicare Other | Source: Ambulatory Visit | Attending: Hematology & Oncology | Admitting: Hematology & Oncology

## 2023-02-08 ENCOUNTER — Encounter: Payer: Self-pay | Admitting: Hematology & Oncology

## 2023-02-08 VITALS — BP 158/86 | HR 57 | Temp 97.9°F | Resp 18 | Ht 75.0 in | Wt 198.0 lb

## 2023-02-08 DIAGNOSIS — Z86718 Personal history of other venous thrombosis and embolism: Secondary | ICD-10-CM | POA: Diagnosis not present

## 2023-02-08 DIAGNOSIS — D500 Iron deficiency anemia secondary to blood loss (chronic): Secondary | ICD-10-CM | POA: Diagnosis not present

## 2023-02-08 DIAGNOSIS — D509 Iron deficiency anemia, unspecified: Secondary | ICD-10-CM | POA: Insufficient documentation

## 2023-02-08 DIAGNOSIS — C73 Malignant neoplasm of thyroid gland: Secondary | ICD-10-CM | POA: Diagnosis not present

## 2023-02-08 DIAGNOSIS — D6859 Other primary thrombophilia: Secondary | ICD-10-CM | POA: Diagnosis not present

## 2023-02-08 DIAGNOSIS — R918 Other nonspecific abnormal finding of lung field: Secondary | ICD-10-CM | POA: Diagnosis not present

## 2023-02-08 DIAGNOSIS — I3139 Other pericardial effusion (noninflammatory): Secondary | ICD-10-CM | POA: Diagnosis not present

## 2023-02-08 LAB — CBC WITH DIFFERENTIAL (CANCER CENTER ONLY)
Abs Immature Granulocytes: 0.03 10*3/uL (ref 0.00–0.07)
Basophils Absolute: 0.1 10*3/uL (ref 0.0–0.1)
Basophils Relative: 1 %
Eosinophils Absolute: 0.1 10*3/uL (ref 0.0–0.5)
Eosinophils Relative: 2 %
HCT: 40.6 % (ref 39.0–52.0)
Hemoglobin: 13.1 g/dL (ref 13.0–17.0)
Immature Granulocytes: 0 %
Lymphocytes Relative: 22 %
Lymphs Abs: 1.6 10*3/uL (ref 0.7–4.0)
MCH: 29.6 pg (ref 26.0–34.0)
MCHC: 32.3 g/dL (ref 30.0–36.0)
MCV: 91.6 fL (ref 80.0–100.0)
Monocytes Absolute: 0.6 10*3/uL (ref 0.1–1.0)
Monocytes Relative: 8 %
Neutro Abs: 4.9 10*3/uL (ref 1.7–7.7)
Neutrophils Relative %: 67 %
Platelet Count: 240 10*3/uL (ref 150–400)
RBC: 4.43 MIL/uL (ref 4.22–5.81)
RDW: 14.1 % (ref 11.5–15.5)
WBC Count: 7.3 10*3/uL (ref 4.0–10.5)
nRBC: 0 % (ref 0.0–0.2)

## 2023-02-08 LAB — CMP (CANCER CENTER ONLY)
ALT: 20 U/L (ref 0–44)
AST: 38 U/L (ref 15–41)
Albumin: 4 g/dL (ref 3.5–5.0)
Alkaline Phosphatase: 88 U/L (ref 38–126)
Anion gap: 6 (ref 5–15)
BUN: 21 mg/dL (ref 8–23)
CO2: 31 mmol/L (ref 22–32)
Calcium: 9.7 mg/dL (ref 8.9–10.3)
Chloride: 98 mmol/L (ref 98–111)
Creatinine: 1.6 mg/dL — ABNORMAL HIGH (ref 0.61–1.24)
GFR, Estimated: 41 mL/min — ABNORMAL LOW (ref >60)
Glucose, Bld: 77 mg/dL (ref 70–99)
Potassium: 5 mmol/L (ref 3.5–5.1)
Sodium: 135 mmol/L (ref 135–145)
Total Bilirubin: 0.7 mg/dL (ref <1.2)
Total Protein: 7.4 g/dL (ref 6.5–8.1)

## 2023-02-08 LAB — FERRITIN: Ferritin: 168 ng/mL (ref 24–336)

## 2023-02-08 LAB — IRON AND IRON BINDING CAPACITY (CC-WL,HP ONLY)
Iron: 73 ug/dL (ref 45–182)
Saturation Ratios: 28 % (ref 17.9–39.5)
TIBC: 262 ug/dL (ref 250–450)
UIBC: 189 ug/dL (ref 117–376)

## 2023-02-08 LAB — VITAMIN B12: Vitamin B-12: 249 pg/mL (ref 180–914)

## 2023-02-08 LAB — RETICULOCYTES
Immature Retic Fract: 2.9 % (ref 2.3–15.9)
RBC.: 4.48 MIL/uL (ref 4.22–5.81)
Retic Count, Absolute: 41.7 10*3/uL (ref 19.0–186.0)
Retic Ct Pct: 0.9 % (ref 0.4–3.1)

## 2023-02-08 NOTE — Progress Notes (Signed)
1 so present along working Hematology and Oncology Follow Up Visit  Clarence Hopkins [id number] Jul 18, 1930 89 y.o. 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 Iron deficiency anemia  Current Therapy:   S/p I-131 therapy on 08/27/2019 Vismodegib 150 mg po q day -- start on 12/01/2021 - on hold since 03/04/2022 Injectafer 750 mg IV - given on 02/08/2022     Interim History:  [name] is back for follow-up.  It has been quite a while since I last saw him.  I am not exactly sure as to why it took so long for him to get back in.  The last time I saw him was pack in July.  His right eye looks really good.  He was on the best mode to give for this.  He has stopped this for almost a year.  The eyelid looks fantastic.  I think he saw the ophthalmologist he said about a month or so ago and he was very pleased with the result.  As far as the thyroid cancer is concerned, his last CT of the chest was done back in September.  At that time, everything looked relatively stable.  There may have been some slight increase in the right lower lobe.  He had a CT scan done today which I do not have the results back yet.  He is not complain of any cough or shortness of breath.  He is on Coumadin because of the atrial fibrillation.  His appetite is doing okay.  He did have a nice Christmas with his family.  His wife, unfortunately, cannot make it with him today.  She apparently is having a lot of right hip problems.  She is going need to have hip surgery I think in January.  He has had no cough.  There is no bleeding.  He has had no change in bowel or bladder habits.  He is chronically fatigued.  I do not know if this might be just from his age.  Currently, I would have to say that his performance status is probably ECOG 2, at best.   Medications:  Current Outpatient Medications:    Ibuprofen-diphenhydrAMINE Cit (ADVIL PM  PO), Take by mouth at bedtime as needed., Disp: , Rfl:    levothyroxine (SYNTHROID) 200 MCG tablet, Take 1 tablet (200 mcg total) by mouth daily for a total of per day., Disp: 90 tablet, Rfl: 3   levothyroxine (SYNTHROID) 25 MCG tablet, Take 25 mcg by mouth daily. Take with 200 mcg tablet to total 225 mcg dose by Dr. Shawnee Knapp, Disp: , Rfl:    Polyethylene Glycol 3350 (MIRALAX PO), Take by mouth daily., Disp: , Rfl:    tamsulosin (FLOMAX) 0.4 MG CAPS capsule, Take 1 capsule (0.4 mg total) by mouth at bedtime., Disp: 30 capsule, Rfl: 0   warfarin (COUMADIN) 5 MG tablet, Take 1 tablet (5 mg total) by mouth daily or as directed, Disp: 105 tablet, Rfl: 1   finasteride (PROSCAR) 5 MG tablet, Take 1 tablet (5 mg total) by mouth daily. (Patient not taking: Reported on 02/08/2023), Disp: 90 tablet, Rfl: 3  Allergies: No Known Allergies  Past Medical History, Surgical history, Social history, and Family History were reviewed and updated.  Review of Systems: Review of Systems  Constitutional: Negative.   HENT:  Negative.    Eyes: Negative.   Respiratory: Negative.    Cardiovascular: Negative.   Gastrointestinal: Negative.  Endocrine: Negative.   Genitourinary: Negative.    Musculoskeletal: Negative.   Skin: Negative.   Neurological: Negative.   Hematological: Negative.   Psychiatric/Behavioral: Negative.      Physical Exam: His vital signs show a temperature of 97.9.  Pulse 57.  Blood pressure 158/86.  Weight is 198 pounds.   Wt Readings from Last 3 Encounters:  02/08/23 198 lb (89.8 kg)  08/31/22 185 lb (83.9 kg)  07/25/22 182 lb (82.6 kg)    Physical Exam Vitals reviewed.  HENT:     Head: Normocephalic and atraumatic.     Comments: The area under his right eye looks quite good.  I really do not see any obvious lesion.  There may be a little bit of fullness.  He has good extraocular muscle movement.   Eyes:     Pupils: Pupils are equal, round, and reactive to light.   Cardiovascular:     Heart sounds: Normal heart sounds.     Comments: Cardiac exam is slow but regular.  He does have occasional extra beats.  He does have an occasional skipped beat.  He has 1/6 systolic ejection murmur. Pulmonary:     Effort: Pulmonary effort is normal.     Breath sounds: Normal breath sounds.     Comments: His lungs sound clear bilaterally.  He has good air movement bilaterally.  I do not hear any wheezing. Abdominal:     General: Bowel sounds are normal.     Palpations: Abdomen is soft.  Musculoskeletal:        General: No tenderness or deformity. Normal range of motion.     Cervical back: Normal range of motion.  Lymphadenopathy:     Cervical: No cervical adenopathy.  Skin:    General: Skin is warm and dry.     Findings: No erythema or rash.  Neurological:     Mental Status: He is alert and oriented to person, place, and time.  Psychiatric:        Behavior: Behavior normal.        Thought Content: Thought content normal.        Judgment: Judgment normal.     Lab Results  Component Value Date   WBC 7.3 02/08/2023   HGB 13.1 02/08/2023   HCT 40.6 02/08/2023   MCV 91.6 02/08/2023   PLT 240 02/08/2023     Chemistry      Component Value Date/Time   NA 135 02/08/2023 1317   NA 142 11/29/2020 0000   K 5.0 02/08/2023 1317   CL 98 02/08/2023 1317   CO2 31 02/08/2023 1317   BUN 21 02/08/2023 1317   BUN 19 11/29/2020 0000   CREATININE 1.60 (H) 02/08/2023 1317   GLU 95 11/29/2020 0000      Component Value Date/Time   CALCIUM 9.7 02/08/2023 1317   ALKPHOS 88 02/08/2023 1317   AST 38 02/08/2023 1317   ALT 20 02/08/2023 1317   BILITOT 0.7 02/08/2023 1317      Impression and Plan: [name] is a very nice 89 year old white male.  He has metastatic thyroid cancer.  This is a differentiated thyroid cancer.  Thankfully it has been responsive to the radioactive iodine.   Will be incredibly interesting to see what the CT scan shows.  If we do find he  has progressive disease, we will get to think about get him on some type of therapy.  I will get him on an oral therapy.  If this is truly the  case, we will have to be very cautious as to how we treat this.  I am just glad that his overall quality of life seems to be managing.  I probably would like to see him back in another couple months.  Again, we will have to see what the CT scan shows.   Josph Macho, MD 12/26/20241:55 PM

## 2023-02-19 ENCOUNTER — Ambulatory Visit (HOSPITAL_COMMUNITY)
Admission: RE | Admit: 2023-02-19 | Discharge: 2023-02-19 | Disposition: A | Discharge status: Home / Self Care | Payer: Medicare Other | Source: Ambulatory Visit | Attending: Family Medicine | Admitting: Family Medicine

## 2023-02-19 ENCOUNTER — Other Ambulatory Visit: Payer: Self-pay

## 2023-02-19 ENCOUNTER — Encounter (HOSPITAL_COMMUNITY): Payer: Self-pay

## 2023-02-19 ENCOUNTER — Ambulatory Visit: Payer: Self-pay | Admitting: Internal Medicine

## 2023-02-19 VITALS — BP 152/94 | HR 62 | Temp 97.6°F | Resp 20

## 2023-02-19 DIAGNOSIS — R197 Diarrhea, unspecified: Secondary | ICD-10-CM

## 2023-02-19 DIAGNOSIS — R194 Change in bowel habit: Secondary | ICD-10-CM

## 2023-02-19 NOTE — Discharge Instructions (Signed)
 Please bring the stool specimen back to our clinic for [place] to take to the lab for testing.  Please also follow-up with your primary care about this issue, as you may need more evaluation that we cannot get ordered here in an urgent care clinic.

## 2023-02-19 NOTE — Telephone Encounter (Signed)
 Copied from CRM [id number]. Topic: Clinical - Pink Word Triage >> Feb 19, 2023  8:24 AM Pinkey ORN wrote: Reason for Triage: 89 YEAR OLD. Loose Stool , Diarrhea >> Feb 19, 2023  8:27 AM Pinkey ORN wrote: Patient is requesting to be seen by his provider Dr. Garald , no one else.  Call back number [phone]   0833: Called re: CRM [phone], no answer, left vm

## 2023-02-19 NOTE — Telephone Encounter (Signed)
 Copied from CRM [id number]. Topic: Clinical - Pink Word Triage >> Feb 19, 2023  8:48 AM Laymon HERO wrote: Reason for CRM: Patient calling back after he received a voicemail to call back. He called in earlier that he has Diarrhea and not feeling well. Did let him know that offices are on a delay for appointments today and there was none available with his doctor.  Chief Complaint: diarrhea Symptoms: watery diarrhea, weakness Frequency: x 3 days Pertinent Negatives: Patient denies SOB, chest pain Disposition: [] ED /[x] Urgent Care (no appt availability in office) / [] Appointment(In office/virtual)/ []  [place] [facility]/ [] Home Care/ [] Refused Recommended Disposition /[] [facility] [facility]/ []  Follow-up with PCP Additional Notes: Urgent care appt made for 02/19/2023 due to no appt available with PCP.  Pt having ongoing diarrhea and is very weak - unable to keep anything down - pt states everything runs right through him.  Home care advice given  Reason for Disposition  [1] MODERATE diarrhea (e.g., 4-6 times / day more than normal) AND [2] present > 48 hours (2 days)  Answer Assessment - Initial Assessment Questions 1. DIARRHEA SEVERITY: How bad is the diarrhea? How many more stools have you had in the past 24 hours than normal?    - NO DIARRHEA (SCALE 0)   - MILD (SCALE 1-3): Few loose or mushy BMs; increase of 1-3 stools over normal daily number of stools; mild increase in ostomy output.   -  MODERATE (SCALE 4-7): Increase of 4-6 stools daily over normal; moderate increase in ostomy output.   -  SEVERE (SCALE 8-10; OR WORST POSSIBLE): Increase of 7 or more stools daily over normal; moderate increase in ostomy output; incontinence.     Moderate - pt states everything goes right through patient 2. ONSET: When did the diarrhea begin?      3 days ago 3. BM CONSISTENCY: How loose or watery is the diarrhea?      watery 4. VOMITING: Are you also vomiting? If Yes, ask: How many times  in the past 24 hours?      no 5. ABDOMEN PAIN: Are you having any abdomen pain? If Yes, ask: What does it feel like? (e.g., crampy, dull, intermittent, constant)      no 6. ABDOMEN PAIN SEVERITY: If present, ask: How bad is the pain?  (e.g., Scale 1-10; mild, moderate, or severe)   - MILD (1-3): doesn't interfere with normal activities, abdomen soft and not tender to touch    - MODERATE (4-7): interferes with normal activities or awakens from sleep, abdomen tender to touch    - SEVERE (8-10): excruciating pain, doubled over, unable to do any normal activities       moderate 7. ORAL INTAKE: If vomiting, Have you been able to drink liquids? How much liquids have you had in the past 24 hours?     unknown 8. HYDRATION: Any signs of dehydration? (e.g., dry mouth [not just dry lips], too weak to stand, dizziness, new weight loss) When did you last urinate?    Not taking in any fluids and barely urinating 9. EXPOSURE: Have you traveled to a foreign country recently? Have you been exposed to anyone with diarrhea? Could you have eaten any food that was spoiled?     No  10. ANTIBIOTIC USE: Are you taking antibiotics now or have you taken antibiotics in the past 2 months?       No ABX but cancer patient 11. OTHER SYMPTOMS: Do you have any other symptoms? (  e.g., fever, blood in stool) Had constipation last week turned into Diarrhea about 3 days and now very weak 12. PREGNANCY: Is there any chance you are pregnant? When was your last menstrual period?       no  Protocols used: [facility]

## 2023-02-19 NOTE — ED Provider Notes (Signed)
 [facility]    CSN: [id number] Arrival date & time: 02/19/23  1547      History   Chief Complaint Chief Complaint  Patient presents with   Diarrhea    Entered by patient   Appointment    15:30    HPI Clarence Hopkins is a 89 y.o. male.    Diarrhea Here for loose stools that he has been having in the last week.  They are happening once or maybe twice a day.  No blood in his stool.  He states that he is actually had loose stools off and on in the last month.  He will have looser stools for a few days and then he will be blocked for a few days.  No abdominal pain and no nausea or vomiting.  Appetite has maybe been reduced in the last 2 or 3 days.   no fever and no cough or congestion   He is on Coumadin    Past Medical History:  Diagnosis Date   Arthritis    Cancer (HCC) 2003   melanoma  L ear   GERD (gastroesophageal reflux disease)    Hypothyroidism    post op   Lung mass    Pulmonary embolism (HCC) 2005 & 2007   protein C deficiency    Patient Active Problem List   Diagnosis Date Noted   Iron deficiency anemia due to chronic blood loss 02/01/2022   Basal cell carcinoma of lower eyelid, right 07/05/2021   Chronic constipation 02/27/2021   Fall (on) (from) other stairs and steps, sequela 02/23/2021   Rectal bleeding 11/18/2020   Pressure injury of skin 11/18/2020   Rectal bleed 11/17/2020   Multifocal pneumonia 08/04/2020   Right lower lobe lung mass    DOE (dyspnea on exertion) 06/03/2019   Near syncope 06/03/2019   Dysphagia 03/07/2018   Thyroid  cancer (HCC) 02/10/2016   Medicare annual wellness visit, subsequent 02/02/2015   Hypertriglyceridemia 12/22/2013   Encounter for therapeutic drug monitoring 04/09/2013   History of basal cell cancer 10/30/2012   Long term (current) use of anticoagulants 05/23/2010   Pulmonary embolism (HCC) 03/28/2010   Protein C deficiency (HCC) 11/05/2009   Postoperative hypothyroidism 01/13/2008    Elevated PSA measurement 01/13/2008   Melanoma (HCC) 01/13/2008   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   PULMONARY EMBOLISM, HX OF 11/09/2006    Past Surgical History:  Procedure Laterality Date   BRONCHIAL BRUSHINGS  07/15/2019   Procedure: BRONCHIAL BRUSHINGS;  Surgeon: Jude Harden GAILS, MD;  Location: [facility] ENDOSCOPY;  Service: Cardiopulmonary;;   BRONCHIAL WASHINGS  07/15/2019   Procedure: BRONCHIAL WASHINGS;  Surgeon: Jude Harden GAILS, MD;  Location: [facility] ENDOSCOPY;  Service: Cardiopulmonary;;   CARDIAC CATHETERIZATION     > 20 years ago  everything was okay    CERVICAL FUSION      X 2; Dr Unice   COLONOSCOPY     ENDOBRONCHIAL ULTRASOUND N/A 07/15/2019   Procedure: ENDOBRONCHIAL ULTRASOUND;  Surgeon: Jude Harden GAILS, MD;  Location: [facility] ENDOSCOPY;  Service: Cardiopulmonary;  Laterality: N/A;   FINE NEEDLE ASPIRATION  07/15/2019   Procedure: FINE NEEDLE ASPIRATION (FNA) LINEAR;  Surgeon: Jude Harden GAILS, MD;  Location: [facility] ENDOSCOPY;  Service: Cardiopulmonary;;   FINGER ARTHROPLASTY Right 04/17/2013   Procedure: IMPLANT ARTHROPLASTY RIGHT INDEX;  Surgeon: Lamar GAILS Leonor Mickey., MD;  Location: [facility];  Service: Orthopedics;  Laterality: Right;   LUMBAR LAMINECTOMY  2010   Dr Canary   MELANOMA EXCISION  2003   L ear   THYROIDECTOMY  1971   cytology indefinite   TONSILLECTOMY     TOTAL KNEE ARTHROPLASTY  2004   left   VIDEO BRONCHOSCOPY N/A 07/15/2019   Procedure: VIDEO BRONCHOSCOPY WITHOUT FLUORO;  Surgeon: Jude Harden GAILS, MD;  Location: [facility] ENDOSCOPY;  Service: Cardiopulmonary;  Laterality: N/A;   WISDOM TOOTH EXTRACTION         Home Medications    Prior to Admission medications   Medication Sig Start Date End Date Taking? Authorizing Provider  finasteride  (PROSCAR ) 5 MG tablet Take 1 tablet (5 mg total) by mouth daily. Patient not taking: Reported on 02/08/2023 10/19/21     Ibuprofen-diphenhydrAMINE Cit (ADVIL PM PO) Take by mouth at bedtime as needed.     [provider]  levothyroxine  (SYNTHROID ) 200 MCG tablet Take 1 tablet (200 mcg total) by mouth daily for a total of 225mcg per day. Patient not taking: Reported on 02/19/2023 12/29/21     levothyroxine  (SYNTHROID ) 25 MCG tablet Take 25 mcg by mouth daily. Take with 200 mcg tablet to total 225 mcg dose by Dr. Beryl Patient not taking: Reported on 02/19/2023 06/22/21   [provider]  Polyethylene Glycol 3350  (MIRALAX  PO) Take by mouth daily.    [provider]  tamsulosin  (FLOMAX ) 0.4 MG CAPS capsule Take 1 capsule (0.4 mg total) by mouth at bedtime. Patient not taking: Reported on 02/19/2023 08/07/22   Plotnikov, Aleksei V, MD  warfarin (COUMADIN ) 5 MG tablet Take 1 tablet (5 mg total) by mouth daily or as directed 12/15/22   Plotnikov, Karlynn GAILS, MD    Family History Family History  Problem Relation Age of Onset   Breast cancer Mother    Alzheimer's disease Father    Breast cancer Maternal Grandmother    Breast cancer Sister    Cancer Sister    Diabetes Neg Hx    Stroke Neg Hx    Heart disease Neg Hx     Social History Social History   Tobacco Use   Smoking status: Former    Current packs/day: 0.00    Average packs/day: 0.8 packs/day for 41.0 years (30.8 ttl pk-yrs)    Types: Cigarettes    Start date: 02/14/1952    Quit date: 02/13/1993    Years since quitting: 30.0   Smokeless tobacco: Never   Tobacco comments:    smoked 1956-1995, up to 1/2 ppd  Vaping Use   Vaping status: Never Used  Substance Use Topics   Alcohol  use: Yes    Comment:  Vodka 2 oz/ night   Drug use: No     Allergies   Patient has no known allergies.   Review of Systems Review of Systems  Gastrointestinal:  Positive for diarrhea.     Physical Exam Triage Vital Signs ED Triage Vitals  Encounter Vitals Group     BP 02/19/23 1653 (!) 152/94     Systolic BP Percentile --      Diastolic BP Percentile --      Pulse Rate 02/19/23 1653 62     Resp 02/19/23 1653 20     Temp  02/19/23 1653 97.6 F (36.4 C)     Temp Source 02/19/23 1653 Oral     SpO2 02/19/23 1653 94 %     Weight --      Height --      Head Circumference --      Peak Flow --      Pain Score 02/19/23  1650 0     Pain Loc --      Pain Education --      Exclude from Growth Chart --    No data found.  Updated Vital Signs BP (!) 152/94 (BP Location: Left Arm)   Pulse 62   Temp 97.6 F (36.4 C) (Oral)   Resp 20   SpO2 94%   Visual Acuity Right Eye Distance:   Left Eye Distance:   Bilateral Distance:    Right Eye Near:   Left Eye Near:    Bilateral Near:     Physical Exam Vitals reviewed.  Constitutional:      General: He is not in acute distress.    Appearance: He is not ill-appearing, toxic-appearing or diaphoretic.  HENT:     Nose: Nose normal.     Mouth/Throat:     Mouth: Mucous membranes are moist.     Pharynx: No oropharyngeal exudate or posterior oropharyngeal erythema.  Eyes:     Extraocular Movements: Extraocular movements intact.     Conjunctiva/sclera: Conjunctivae normal.     Pupils: Pupils are equal, round, and reactive to light.  Cardiovascular:     Rate and Rhythm: Normal rate and regular rhythm.     Heart sounds: No murmur heard. Pulmonary:     Effort: Pulmonary effort is normal.     Breath sounds: Normal breath sounds.  Abdominal:     General: There is no distension.     Palpations: Abdomen is soft.     Tenderness: There is no abdominal tenderness. There is no guarding.  Musculoskeletal:     Cervical back: Neck supple.  Lymphadenopathy:     Cervical: No cervical adenopathy.  Skin:    Capillary Refill: Capillary refill takes less than 2 seconds.     Coloration: Skin is not jaundiced or pale.  Neurological:     General: No focal deficit present.     Mental Status: He is alert and oriented to person, place, and time.  Psychiatric:        Behavior: Behavior normal.      UC Treatments / Results  Labs (all labs ordered are listed, but only  abnormal results are displayed) Labs Reviewed - No data to display  EKG   Radiology No results found.  Procedures Procedures (including critical care time)  Medications Ordered in UC Medications - No data to display  Initial Impression / Assessment and Plan / UC Course  I have reviewed the triage vital signs and the nursing notes.  Pertinent labs & imaging results that were available during my care of the patient were reviewed by me and considered in my medical decision making (see chart for details).     stool studies were ordered for completeness sake.  I do think he is having more bowel habit changes in the actual infectious diarrhea.  I have asked him to make a follow-up appointment with his primary care to evaluate further Final Clinical Impressions(s) / UC Diagnoses   Final diagnoses:  Diarrhea, unspecified type  Bowel habit changes     Discharge Instructions      Please bring the stool specimen back to our clinic for us  to take to the lab for testing.  Please also follow-up with your primary care about this issue, as you may need more evaluation that we cannot get ordered here in an urgent care clinic.     ED Prescriptions   None    PDMP not reviewed this encounter.  Vonna Sharlet POUR, MD 02/19/23 1710

## 2023-02-19 NOTE — ED Triage Notes (Signed)
 Complains of diarrhea for 5-6 days.  Patient has not taken any medications for diarrhea.  Reports one episode of diarrhea today.  Patient has had a small cup of soup.  Denies any pain.

## 2023-02-20 ENCOUNTER — Ambulatory Visit: Payer: Medicare Other

## 2023-02-20 NOTE — Telephone Encounter (Signed)
 Agree with urgent care visit - the patient was seen yesterday by Dr. Marlinda Mike.

## 2023-02-22 ENCOUNTER — Telehealth: Payer: Self-pay

## 2023-02-22 ENCOUNTER — Other Ambulatory Visit (HOSPITAL_BASED_OUTPATIENT_CLINIC_OR_DEPARTMENT_OTHER): Payer: Self-pay

## 2023-02-22 NOTE — Telephone Encounter (Signed)
 Pt missed coumadin clinic apt on 1/7. Review of chart shows pt was at urgent care for diarrhea the day before. Consistent diarrhea can increase INR.   LVM for pt to return call to RS apt.

## 2023-02-23 NOTE — Telephone Encounter (Signed)
 Contacted pt and he requested to RS for next week. He is still not feeling completely well. RS for 1/17. Pt verbalized understanding.

## 2023-03-02 ENCOUNTER — Other Ambulatory Visit: Payer: Self-pay

## 2023-03-02 ENCOUNTER — Inpatient Hospital Stay (HOSPITAL_BASED_OUTPATIENT_CLINIC_OR_DEPARTMENT_OTHER): Payer: Medicare Other | Admitting: Hematology & Oncology

## 2023-03-02 ENCOUNTER — Encounter: Payer: Self-pay | Admitting: Hematology & Oncology

## 2023-03-02 ENCOUNTER — Inpatient Hospital Stay: Payer: Medicare Other | Attending: Hematology & Oncology

## 2023-03-02 ENCOUNTER — Ambulatory Visit (INDEPENDENT_AMBULATORY_CARE_PROVIDER_SITE_OTHER): Payer: Medicare Other

## 2023-03-02 VITALS — BP 137/90 | HR 62 | Temp 97.5°F | Resp 16 | Ht 75.0 in | Wt 195.0 lb

## 2023-03-02 DIAGNOSIS — Z7901 Long term (current) use of anticoagulants: Secondary | ICD-10-CM | POA: Diagnosis not present

## 2023-03-02 DIAGNOSIS — R918 Other nonspecific abnormal finding of lung field: Secondary | ICD-10-CM | POA: Insufficient documentation

## 2023-03-02 DIAGNOSIS — I4891 Unspecified atrial fibrillation: Secondary | ICD-10-CM | POA: Diagnosis not present

## 2023-03-02 DIAGNOSIS — D500 Iron deficiency anemia secondary to blood loss (chronic): Secondary | ICD-10-CM

## 2023-03-02 DIAGNOSIS — D509 Iron deficiency anemia, unspecified: Secondary | ICD-10-CM | POA: Insufficient documentation

## 2023-03-02 DIAGNOSIS — Z79890 Hormone replacement therapy (postmenopausal): Secondary | ICD-10-CM | POA: Diagnosis not present

## 2023-03-02 DIAGNOSIS — D6859 Other primary thrombophilia: Secondary | ICD-10-CM

## 2023-03-02 DIAGNOSIS — Z86718 Personal history of other venous thrombosis and embolism: Secondary | ICD-10-CM

## 2023-03-02 DIAGNOSIS — C73 Malignant neoplasm of thyroid gland: Secondary | ICD-10-CM | POA: Diagnosis not present

## 2023-03-02 LAB — CBC WITH DIFFERENTIAL (CANCER CENTER ONLY)
Abs Immature Granulocytes: 0.02 10*3/uL (ref 0.00–0.07)
Basophils Absolute: 0.1 10*3/uL (ref 0.0–0.1)
Basophils Relative: 1 %
Eosinophils Absolute: 0.1 10*3/uL (ref 0.0–0.5)
Eosinophils Relative: 2 %
HCT: 41.8 % (ref 39.0–52.0)
Hemoglobin: 13.1 g/dL (ref 13.0–17.0)
Immature Granulocytes: 0 %
Lymphocytes Relative: 22 %
Lymphs Abs: 1.2 10*3/uL (ref 0.7–4.0)
MCH: 29.2 pg (ref 26.0–34.0)
MCHC: 31.3 g/dL (ref 30.0–36.0)
MCV: 93.1 fL (ref 80.0–100.0)
Monocytes Absolute: 0.5 10*3/uL (ref 0.1–1.0)
Monocytes Relative: 10 %
Neutro Abs: 3.6 10*3/uL (ref 1.7–7.7)
Neutrophils Relative %: 65 %
Platelet Count: 212 10*3/uL (ref 150–400)
RBC: 4.49 MIL/uL (ref 4.22–5.81)
RDW: 14.1 % (ref 11.5–15.5)
WBC Count: 5.5 10*3/uL (ref 4.0–10.5)
nRBC: 0 % (ref 0.0–0.2)

## 2023-03-02 LAB — POCT INR: INR: 2.4 (ref 2.0–3.0)

## 2023-03-02 LAB — CMP (CANCER CENTER ONLY)
ALT: 26 U/L (ref 0–44)
AST: 37 U/L (ref 15–41)
Albumin: 4.2 g/dL (ref 3.5–5.0)
Alkaline Phosphatase: 86 U/L (ref 38–126)
Anion gap: 5 (ref 5–15)
BUN: 19 mg/dL (ref 8–23)
CO2: 33 mmol/L — ABNORMAL HIGH (ref 22–32)
Calcium: 9.8 mg/dL (ref 8.9–10.3)
Chloride: 99 mmol/L (ref 98–111)
Creatinine: 1.49 mg/dL — ABNORMAL HIGH (ref 0.61–1.24)
GFR, Estimated: 44 mL/min — ABNORMAL LOW (ref >60)
Glucose, Bld: 60 mg/dL — ABNORMAL LOW (ref 70–99)
Potassium: 5 mmol/L (ref 3.5–5.1)
Sodium: 137 mmol/L (ref 135–145)
Total Bilirubin: 0.6 mg/dL (ref 0.0–1.2)
Total Protein: 7.9 g/dL (ref 6.5–8.1)

## 2023-03-02 LAB — PREALBUMIN: Prealbumin: 21 mg/dL (ref 18–38)

## 2023-03-02 LAB — IRON AND IRON BINDING CAPACITY (CC-WL,HP ONLY)
Iron: 57 ug/dL (ref 45–182)
Saturation Ratios: 21 % (ref 17.9–39.5)
TIBC: 273 ug/dL (ref 250–450)
UIBC: 216 ug/dL (ref 117–376)

## 2023-03-02 LAB — TSH: TSH: 82.353 u[IU]/mL — ABNORMAL HIGH (ref 0.350–4.500)

## 2023-03-02 LAB — FERRITIN: Ferritin: 156 ng/mL (ref 24–336)

## 2023-03-02 NOTE — Patient Instructions (Addendum)
Pre visit review using our clinic review tool, if applicable. No additional management support is needed unless otherwise documented below in the visit note.  Continue 1 tablet daily except take 1 1/2 tablets on Saturday.  Recheck in 4 weeks at [facility].

## 2023-03-02 NOTE — Progress Notes (Signed)
Continue 1 tablet daily except take 1 1/2 tablets on Saturday.  Recheck in 4 weeks at [facility].

## 2023-03-06 ENCOUNTER — Encounter: Payer: Self-pay | Admitting: Hematology & Oncology

## 2023-03-06 NOTE — Progress Notes (Signed)
1 so present along working Hematology and Oncology Follow Up Visit  Clarence Hopkins [id number] 1930/05/31 89 y.o. 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 Iron deficiency anemia  Current Therapy:   S/p I-131 therapy on 08/27/2019 Vismodegib 150 mg po q day -- start on 12/01/2021 - on hold since 03/04/2022 Injectafer 750 mg IV - given on 02/08/2022     Interim History:  Clarence Hopkins is back for follow-up.  He seems to be doing okay.  We did go ahead and do a CT scan on him back in December.  However, the CT scan results seem to show that his thyroid cancer was more active.  He had increase in the right lower lobe mass.  Also noted was an increase in a left lower lobe mass.  There is no evidence of new disease.  Unfortunately, I doubt that he has been taking his Synthroid.  We checked his TSH.  His THS was almost 85.  I am not sure why he has not been taking the Synthroid.  Hopefully, he will get back onto the Synthroid.  Hopefully this will help with his cancer.  I did speak with his Endocrinologist.  He actually has an appointment with his endocrinologist in February.  Clarence Hopkins will check his TSH and thyroglobulin at that time.  He has had no cough.  He has had no problems with hoarseness.  He has had no swallowing difficulties.  He is eating okay.  Unfortunately, I think his wife is going to have hip surgery next week.  His basal cell under the right eye continues to do incredibly well.  He has been off the vismodegib for about a year now.  He continues on Coumadin because of the atrial fibrillation.  He does have quite a bit of fatigue.  Again, I suspect is probably from him not taking his Synthroid.  Overall, I would say his performance status is probably ECOG 2.  .    Medications:  Current Outpatient Medications:    finasteride (PROSCAR) 5 MG tablet, Take 1 tablet (5 mg total) by mouth daily.  (Patient not taking: Reported on 02/08/2023), Disp: 90 tablet, Rfl: 3   Ibuprofen-diphenhydrAMINE Cit (ADVIL PM PO), Take by mouth at bedtime as needed., Disp: , Rfl:    levothyroxine (SYNTHROID) 200 MCG tablet, Take 1 tablet (200 mcg total) by mouth daily for a total of per day. (Patient not taking: Reported on 02/19/2023), Disp: 90 tablet, Rfl: 3   levothyroxine (SYNTHROID) 25 MCG tablet, Take 25 mcg by mouth daily. Take with 200 mcg tablet to total 225 mcg dose by Dr. Shawnee Knapp (Patient not taking: Reported on 02/19/2023), Disp: , Rfl:    Polyethylene Glycol 3350 (MIRALAX PO), Take by mouth daily., Disp: , Rfl:    tamsulosin (FLOMAX) 0.4 MG CAPS capsule, Take 1 capsule (0.4 mg total) by mouth at bedtime. (Patient not taking: Reported on 02/19/2023), Disp: 30 capsule, Rfl: 0   warfarin (COUMADIN) 5 MG tablet, Take 1 tablet (5 mg total) by mouth daily or as directed, Disp: 105 tablet, Rfl: 1  Allergies: No Known Allergies  Past Medical History, Surgical history, Social history, and Family History were reviewed and updated.  Review of Systems: Review of Systems  Constitutional: Negative.   HENT:  Negative.    Eyes: Negative.   Respiratory: Negative.    Cardiovascular: Negative.   Gastrointestinal: Negative.   Endocrine: Negative.   Genitourinary:  Negative.    Musculoskeletal: Negative.   Skin: Negative.   Neurological: Negative.   Hematological: Negative.   Psychiatric/Behavioral: Negative.      Physical Exam: His vital signs show a temperature of 97.5.  Pulse 62.  Blood pressure 137/90.  Weight is 195 pounds.    Wt Readings from Last 3 Encounters:  03/02/23 195 lb (88.5 kg)  02/08/23 198 lb (89.8 kg)  08/31/22 185 lb (83.9 kg)    Physical Exam Vitals reviewed.  HENT:     Head: Normocephalic and atraumatic.     Comments: The area under his right eye looks quite good.  I really do not see any obvious lesion.  There may be a little bit of fullness.  He has good extraocular muscle  movement.   Eyes:     Pupils: Pupils are equal, round, and reactive to light.  Cardiovascular:     Heart sounds: Normal heart sounds.     Comments: Cardiac exam is slow but regular.  He does have occasional extra beats.  He does have an occasional skipped beat.  He has 1/6 systolic ejection murmur. Pulmonary:     Effort: Pulmonary effort is normal.     Breath sounds: Normal breath sounds.     Comments: His lungs sound clear bilaterally.  He has good air movement bilaterally.  I do not hear any wheezing. Abdominal:     General: Bowel sounds are normal.     Palpations: Abdomen is soft.  Musculoskeletal:        General: No tenderness or deformity. Normal range of motion.     Cervical back: Normal range of motion.  Lymphadenopathy:     Cervical: No cervical adenopathy.  Skin:    General: Skin is warm and dry.     Findings: No erythema or rash.  Neurological:     Mental Status: He is alert and oriented to person, place, and time.  Psychiatric:        Behavior: Behavior normal.        Thought Content: Thought content normal.        Judgment: Judgment normal.      Lab Results  Component Value Date   WBC 5.5 03/02/2023   HGB 13.1 03/02/2023   HCT 41.8 03/02/2023   MCV 93.1 03/02/2023   PLT 212 03/02/2023     Chemistry      Component Value Date/Time   NA 137 03/02/2023 0958   NA 142 11/29/2020 0000   K 5.0 03/02/2023 0958   CL 99 03/02/2023 0958   CO2 33 (H) 03/02/2023 0958   BUN 19 03/02/2023 0958   BUN 19 11/29/2020 0000   CREATININE 1.49 (H) 03/02/2023 0958   GLU 95 11/29/2020 0000      Component Value Date/Time   CALCIUM 9.8 03/02/2023 0958   ALKPHOS 86 03/02/2023 0958   AST 37 03/02/2023 0958   ALT 26 03/02/2023 0958   BILITOT 0.6 03/02/2023 0958      Impression and Plan: Clarence Hopkins is a very nice 89 year old white male.  He has metastatic thyroid cancer.  This is a differentiated thyroid cancer.  Thankfully it has been responsive to the radioactive iodine.    Hopefully, him taking his Synthroid will help with the thyroid cancer again.  I think also, taking Synthroid will help with his fatigue.  I really need to somehow make sure that he takes his Synthroid.  With his wife have a hip surgery, this might be a little bit  difficult since I think she monitors his medication intake.  I would like to see him back probably in another month.  I probably would not think about doing another CT scan on him probably for about 3 months.    Josph Macho, MD 1/21/20258:08 AM

## 2023-03-13 ENCOUNTER — Encounter: Payer: Self-pay | Admitting: Hematology & Oncology

## 2023-03-13 ENCOUNTER — Other Ambulatory Visit (HOSPITAL_BASED_OUTPATIENT_CLINIC_OR_DEPARTMENT_OTHER): Payer: Self-pay

## 2023-03-13 MED ORDER — LEVOTHYROXINE SODIUM 200 MCG PO TABS
200.0000 ug | ORAL_TABLET | Freq: Every day | ORAL | 3 refills | Status: DC
  Filled 2023-03-13 – 2023-06-01 (×2): qty 90, 90d supply, fill #0

## 2023-03-13 MED ORDER — LEVOTHYROXINE SODIUM 25 MCG PO TABS
25.0000 ug | ORAL_TABLET | Freq: Every day | ORAL | 3 refills | Status: DC
  Filled 2023-03-13 – 2023-06-01 (×2): qty 90, 90d supply, fill #0

## 2023-03-17 LAB — THYROGLOBULIN LEVEL: Thyroglobulin: 227 ng/mL — ABNORMAL HIGH

## 2023-03-23 ENCOUNTER — Other Ambulatory Visit (HOSPITAL_BASED_OUTPATIENT_CLINIC_OR_DEPARTMENT_OTHER): Payer: Self-pay

## 2023-03-27 ENCOUNTER — Ambulatory Visit: Payer: Medicare Other

## 2023-04-03 ENCOUNTER — Ambulatory Visit: Payer: Medicare Other

## 2023-04-06 ENCOUNTER — Telehealth: Payer: Self-pay

## 2023-04-06 NOTE — Telephone Encounter (Signed)
Pt missed coumadin clinic apt on 2/18. LVM to return call to reschedule apt.

## 2023-04-10 NOTE — Telephone Encounter (Signed)
 LVM

## 2023-04-12 NOTE — Telephone Encounter (Signed)
 LVM to RS coumadin clinic apt.

## 2023-04-16 ENCOUNTER — Inpatient Hospital Stay: Payer: Medicare Other | Attending: Hematology & Oncology

## 2023-04-16 ENCOUNTER — Inpatient Hospital Stay: Payer: Medicare Other | Admitting: Hematology & Oncology

## 2023-04-17 NOTE — Telephone Encounter (Signed)
 Left another VM

## 2023-04-24 NOTE — Telephone Encounter (Signed)
 Left another VM to Rs coumadin clinic apt.

## 2023-04-27 ENCOUNTER — Ambulatory Visit (INDEPENDENT_AMBULATORY_CARE_PROVIDER_SITE_OTHER)

## 2023-04-27 DIAGNOSIS — Z7901 Long term (current) use of anticoagulants: Secondary | ICD-10-CM | POA: Diagnosis not present

## 2023-04-27 LAB — POCT INR: INR: 6 — AB (ref 2.0–3.0)

## 2023-04-27 NOTE — Progress Notes (Addendum)
 Pt is taking OTC cold medication with ibuprofen in it for the last 3 days. Advised this interacts with warfarin and he cannot take this. Pt verbalized understanding. Pt denies any current s/s of abnormal bruising or bleeding. Advised pt a lab INR should be drawn to assure the POCT INR is accurate. Pt refused lab INR, he stated "I am short on time." Advised if any s/s of bleeding or abnormal bruising to go to ER or call 911. Pt verbalized understanding.  Pt already took warfarin today. Hold all warfarin and recheck INR on Tuesday, 3/18. If any signs and symptoms of bleeding call 911 or go to the Emergency Department.   Medical screening examination/treatment/procedure(s) were performed by non-physician practitioner and as supervising physician I was immediately available for consultation/collaboration.  I agree with above. Jacinta Shoe, MD

## 2023-04-27 NOTE — Patient Instructions (Addendum)
 Pre visit review using our clinic review tool, if applicable. No additional management support is needed unless otherwise documented below in the visit note.  Hold all warfarin and recheck INR on Tuesday, 3/18. If any signs and symptoms of bleeding call 911 or go to the Emergency Department.

## 2023-05-01 ENCOUNTER — Encounter: Payer: Self-pay | Admitting: Hematology & Oncology

## 2023-05-01 ENCOUNTER — Other Ambulatory Visit (HOSPITAL_BASED_OUTPATIENT_CLINIC_OR_DEPARTMENT_OTHER): Payer: Self-pay

## 2023-05-01 ENCOUNTER — Ambulatory Visit

## 2023-05-01 DIAGNOSIS — Z7901 Long term (current) use of anticoagulants: Secondary | ICD-10-CM | POA: Diagnosis not present

## 2023-05-01 LAB — POCT INR: INR: 1.6 — AB (ref 2.0–3.0)

## 2023-05-01 MED ORDER — WARFARIN SODIUM 5 MG PO TABS
ORAL_TABLET | ORAL | 1 refills | Status: DC
Start: 2023-05-01 — End: 2023-07-19
  Filled 2023-05-01 – 2023-06-01 (×2): qty 105, 90d supply, fill #0

## 2023-05-01 NOTE — Progress Notes (Cosign Needed Addendum)
 Pt has held warfarin since 3/14 due to supratherapeutic INR caused by OTC cold medication containing ibuprofen. Pt reports he restart warfarin this morning. Increase dose today to take 1 1/2 tablets and then continue 1 tablet daily except take 1 1/2 tablet on Saturday. Recheck in 2 weeks.  Pt is compliant with warfarin management and PCP apts.  Sent in refill of warfarin to requested pharmacy.    Medical screening examination/treatment/procedure(s) were performed by non-physician practitioner and as supervising physician I was immediately available for consultation/collaboration.  I agree with above. Jacinta Shoe, MD

## 2023-05-01 NOTE — Patient Instructions (Addendum)
 Pre visit review using our clinic review tool, if applicable. No additional management support is needed unless otherwise documented below in the visit note.  Increase dose today to take 1 1/2 tablets and then continue 1 tablet daily except take 1 1/2 tablet on Saturday. Recheck in 2 weeks.

## 2023-05-02 DIAGNOSIS — C441122 Basal cell carcinoma of skin of right lower eyelid, including canthus: Secondary | ICD-10-CM | POA: Diagnosis not present

## 2023-05-02 DIAGNOSIS — Z961 Presence of intraocular lens: Secondary | ICD-10-CM | POA: Diagnosis not present

## 2023-05-02 DIAGNOSIS — H0279 Other degenerative disorders of eyelid and periocular area: Secondary | ICD-10-CM | POA: Diagnosis not present

## 2023-05-02 DIAGNOSIS — H02532 Eyelid retraction right lower eyelid: Secondary | ICD-10-CM | POA: Diagnosis not present

## 2023-05-02 DIAGNOSIS — H16211 Exposure keratoconjunctivitis, right eye: Secondary | ICD-10-CM | POA: Diagnosis not present

## 2023-05-12 ENCOUNTER — Other Ambulatory Visit (HOSPITAL_BASED_OUTPATIENT_CLINIC_OR_DEPARTMENT_OTHER): Payer: Self-pay

## 2023-05-15 ENCOUNTER — Ambulatory Visit (INDEPENDENT_AMBULATORY_CARE_PROVIDER_SITE_OTHER)

## 2023-05-15 DIAGNOSIS — Z7901 Long term (current) use of anticoagulants: Secondary | ICD-10-CM

## 2023-05-15 LAB — POCT INR: INR: 3.2 — AB (ref 2.0–3.0)

## 2023-05-15 NOTE — Progress Notes (Cosign Needed Addendum)
 Pt reports he has been taking 1 tablet daily and not as advised at last apt which was 1 tablet daily except take 1 1/2 tablets on Saturday. Pt is supratherapeutic so dosing calendar to what pt has been taking.  Pt already took warfarin today. Reduce dose tomorrow to take 1/2 tablet and then continue 1 tablet daily. Recheck in 2 weeks.   Medical screening examination/treatment/procedure(s) were performed by non-physician practitioner and as supervising physician I was immediately available for consultation/collaboration.  I agree with above. Jacinta Shoe, MD

## 2023-05-15 NOTE — Patient Instructions (Addendum)
 Pre visit review using our clinic review tool, if applicable. No additional management support is needed unless otherwise documented below in the visit note.  Reduce dose tomorrow to take 1/2 tablet and then continue 1 tablet daily. Recheck in 2 weeks.

## 2023-05-25 ENCOUNTER — Other Ambulatory Visit (HOSPITAL_BASED_OUTPATIENT_CLINIC_OR_DEPARTMENT_OTHER): Payer: Self-pay

## 2023-05-29 ENCOUNTER — Ambulatory Visit

## 2023-05-31 ENCOUNTER — Telehealth: Payer: Self-pay

## 2023-05-31 NOTE — Telephone Encounter (Signed)
 Pt missed coumadin clinic apt on 4/15. LVM to return call to RS>

## 2023-06-01 ENCOUNTER — Other Ambulatory Visit (HOSPITAL_BASED_OUTPATIENT_CLINIC_OR_DEPARTMENT_OTHER): Payer: Self-pay

## 2023-06-07 NOTE — Telephone Encounter (Signed)
 LVM

## 2023-06-08 NOTE — Telephone Encounter (Signed)
 LVM

## 2023-06-11 ENCOUNTER — Other Ambulatory Visit (HOSPITAL_BASED_OUTPATIENT_CLINIC_OR_DEPARTMENT_OTHER): Payer: Self-pay

## 2023-06-13 NOTE — Telephone Encounter (Signed)
 LVM

## 2023-06-15 ENCOUNTER — Ambulatory Visit (INDEPENDENT_AMBULATORY_CARE_PROVIDER_SITE_OTHER)

## 2023-06-15 DIAGNOSIS — Z7901 Long term (current) use of anticoagulants: Secondary | ICD-10-CM

## 2023-06-15 LAB — POCT INR: INR: 2.6 (ref 2.0–3.0)

## 2023-06-15 NOTE — Patient Instructions (Addendum)
 Pre visit review using our clinic review tool, if applicable. No additional management support is needed unless otherwise documented below in the visit note.  Continue 1 tablet daily.  Re-check in 6 weeks.

## 2023-06-15 NOTE — Progress Notes (Signed)
 Continue 1 tablet daily. Recheck in 6 weeks.

## 2023-06-20 ENCOUNTER — Inpatient Hospital Stay

## 2023-06-20 ENCOUNTER — Inpatient Hospital Stay: Admitting: Hematology & Oncology

## 2023-06-21 ENCOUNTER — Inpatient Hospital Stay: Attending: Hematology & Oncology

## 2023-06-21 ENCOUNTER — Other Ambulatory Visit: Payer: Self-pay

## 2023-06-21 ENCOUNTER — Telehealth: Payer: Self-pay | Admitting: *Deleted

## 2023-06-21 ENCOUNTER — Inpatient Hospital Stay (HOSPITAL_BASED_OUTPATIENT_CLINIC_OR_DEPARTMENT_OTHER): Admitting: Hematology & Oncology

## 2023-06-21 ENCOUNTER — Encounter: Payer: Self-pay | Admitting: Hematology & Oncology

## 2023-06-21 VITALS — BP 143/87 | HR 60 | Temp 97.8°F | Resp 19 | Ht 75.0 in | Wt 191.0 lb

## 2023-06-21 DIAGNOSIS — Z79890 Hormone replacement therapy (postmenopausal): Secondary | ICD-10-CM | POA: Diagnosis not present

## 2023-06-21 DIAGNOSIS — D509 Iron deficiency anemia, unspecified: Secondary | ICD-10-CM | POA: Diagnosis not present

## 2023-06-21 DIAGNOSIS — C441122 Basal cell carcinoma of skin of right lower eyelid, including canthus: Secondary | ICD-10-CM | POA: Diagnosis not present

## 2023-06-21 DIAGNOSIS — D500 Iron deficiency anemia secondary to blood loss (chronic): Secondary | ICD-10-CM | POA: Diagnosis not present

## 2023-06-21 DIAGNOSIS — C73 Malignant neoplasm of thyroid gland: Secondary | ICD-10-CM | POA: Insufficient documentation

## 2023-06-21 LAB — CBC WITH DIFFERENTIAL (CANCER CENTER ONLY)
Abs Immature Granulocytes: 0.08 10*3/uL — ABNORMAL HIGH (ref 0.00–0.07)
Basophils Absolute: 0.1 10*3/uL (ref 0.0–0.1)
Basophils Relative: 1 %
Eosinophils Absolute: 0.1 10*3/uL (ref 0.0–0.5)
Eosinophils Relative: 2 %
HCT: 41.7 % (ref 39.0–52.0)
Hemoglobin: 13.1 g/dL (ref 13.0–17.0)
Immature Granulocytes: 1 %
Lymphocytes Relative: 21 %
Lymphs Abs: 1.5 10*3/uL (ref 0.7–4.0)
MCH: 28.2 pg (ref 26.0–34.0)
MCHC: 31.4 g/dL (ref 30.0–36.0)
MCV: 89.7 fL (ref 80.0–100.0)
Monocytes Absolute: 0.6 10*3/uL (ref 0.1–1.0)
Monocytes Relative: 8 %
Neutro Abs: 4.9 10*3/uL (ref 1.7–7.7)
Neutrophils Relative %: 67 %
Platelet Count: 273 10*3/uL (ref 150–400)
RBC: 4.65 MIL/uL (ref 4.22–5.81)
RDW: 13.2 % (ref 11.5–15.5)
WBC Count: 7.2 10*3/uL (ref 4.0–10.5)
nRBC: 0 % (ref 0.0–0.2)

## 2023-06-21 LAB — IRON AND IRON BINDING CAPACITY (CC-WL,HP ONLY)
Iron: 54 ug/dL (ref 45–182)
Saturation Ratios: 21 % (ref 17.9–39.5)
TIBC: 252 ug/dL (ref 250–450)
UIBC: 198 ug/dL (ref 117–376)

## 2023-06-21 LAB — CMP (CANCER CENTER ONLY)
ALT: 11 U/L (ref 0–44)
AST: 22 U/L (ref 15–41)
Albumin: 3.9 g/dL (ref 3.5–5.0)
Alkaline Phosphatase: 93 U/L (ref 38–126)
Anion gap: 6 (ref 5–15)
BUN: 19 mg/dL (ref 8–23)
CO2: 30 mmol/L (ref 22–32)
Calcium: 9.4 mg/dL (ref 8.9–10.3)
Chloride: 100 mmol/L (ref 98–111)
Creatinine: 1.39 mg/dL — ABNORMAL HIGH (ref 0.61–1.24)
GFR, Estimated: 48 mL/min — ABNORMAL LOW (ref >60)
Glucose, Bld: 94 mg/dL (ref 70–99)
Potassium: 4.6 mmol/L (ref 3.5–5.1)
Sodium: 136 mmol/L (ref 135–145)
Total Bilirubin: 0.5 mg/dL (ref 0.0–1.2)
Total Protein: 7.6 g/dL (ref 6.5–8.1)

## 2023-06-21 LAB — FERRITIN: Ferritin: 163 ng/mL (ref 24–336)

## 2023-06-21 LAB — LACTATE DEHYDROGENASE: LDH: 159 U/L (ref 98–192)

## 2023-06-21 LAB — TSH: TSH: 75.6 u[IU]/mL — ABNORMAL HIGH (ref 0.350–4.500)

## 2023-06-21 NOTE — Progress Notes (Signed)
 1 so present along working Hematology and Oncology Follow Up Visit  Travell Ludeke [id number] 1930-07-06 89 y.o. 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 Iron deficiency anemia  Current Therapy:   S/p I-131 therapy on 08/27/2019 Vismodegib  150 mg po q day -- start on 12/01/2021 - on hold since 03/04/2022 Injectafer  750 mg IV - given on 02/24/2022      Interim History:  Mr. Chung is back for follow-up.  Unfortunately, he has been forma since we last saw him.  He has been trying to help his poor wife.  She had the right hip surgery.  She has had a slow recovery.  He is trying to help her.  When we last saw him, he had not been taking his Synthroid .  The TSH was incredibly high.  I think he did see his Endocrinologist.  Dr. Fairy Homer has been managing his thyroid .  Thankfully, the basal cell carcinoma of the right eye has not been a problem for him.  He now has been off the vismodegib  for about 16 months.  He is dealing with the metastatic thyroid  cancer.  He did have a CT scan that was in December.  We really need to repeat this to see how the cancer is doing.  He really does look quite good.  I am less to give a lot of credit.  He is 89 years old.  He is incredibly functional.  He has had no problems with nausea or vomiting.  He is on Coumadin  because of atrial fibrillation.  He has had no bleeding.  When we last saw him, his ferritin was 156 with an iron saturation of 21%.  Overall, I would have to say that his performance status is probably ECOG 2.   .    Medications:  Current Outpatient Medications:    finasteride  (PROSCAR ) 5 MG tablet, Take 1 tablet (5 mg total) by mouth daily. (Patient not taking: Reported on 02/08/2023), Disp: 90 tablet, Rfl: 3   Ibuprofen-diphenhydrAMINE Cit (ADVIL PM PO), Take by mouth at bedtime as needed., Disp: , Rfl:    levothyroxine  (SYNTHROID ) 200 MCG tablet, Take 1 tablet  (200 mcg total) by mouth daily for a total of 225mcg per day. (Patient not taking: Reported on 02/19/2023), Disp: 90 tablet, Rfl: 3   levothyroxine  (SYNTHROID ) 200 MCG tablet, Take 1 tablet (200 mcg total) by mouth daily with 25mcg, Disp: 90 tablet, Rfl: 3   levothyroxine  (SYNTHROID ) 25 MCG tablet, Take 25 mcg by mouth daily. Take with 200 mcg tablet to total 225 mcg dose by Dr. Marcella Serge (Patient not taking: Reported on 02/19/2023), Disp: , Rfl:    levothyroxine  (SYNTHROID ) 25 MCG tablet, Take 1 tablet (25 mcg total) by mouth daily., Disp: 90 tablet, Rfl: 3   Polyethylene Glycol 3350  (MIRALAX  PO), Take by mouth daily., Disp: , Rfl:    tamsulosin  (FLOMAX ) 0.4 MG CAPS capsule, Take 1 capsule (0.4 mg total) by mouth at bedtime. (Patient not taking: Reported on 02/19/2023), Disp: 30 capsule, Rfl: 0   warfarin (COUMADIN ) 5 MG tablet, Take 1 tablet by mouth daily, except take 1.5 tablets on Saturdays or as directed by anticoagulation clinic., Disp: 105 tablet, Rfl: 1  Allergies: No Known Allergies  Past Medical History, Surgical history, Social history, and Family History were reviewed and updated.  Review of Systems: Review of Systems  Constitutional: Negative.   HENT:  Negative.    Eyes: Negative.  Respiratory: Negative.    Cardiovascular: Negative.   Gastrointestinal: Negative.   Endocrine: Negative.   Genitourinary: Negative.    Musculoskeletal: Negative.   Skin: Negative.   Neurological: Negative.   Hematological: Negative.   Psychiatric/Behavioral: Negative.      Physical Exam: His vital signs show a temperature of 97.8.  Pulse 60.  Blood pressure 143/87.  Weight is 191 pounds.     Wt Readings from Last 3 Encounters:  03/02/23 195 lb (88.5 kg)  02/08/23 198 lb (89.8 kg)  08/31/22 185 lb (83.9 kg)    Physical Exam Vitals reviewed.  HENT:     Head: Normocephalic and atraumatic.     Comments: The area under his right eye looks quite good.  I really do not see any obvious lesion.  There  may be a little bit of fullness.  He has good extraocular muscle movement.   Eyes:     Pupils: Pupils are equal, round, and reactive to light.  Cardiovascular:     Heart sounds: Normal heart sounds.     Comments: Cardiac exam is slow but regular.  He does have occasional extra beats.  He does have an occasional skipped beat.  He has 1/6 systolic ejection murmur. Pulmonary:     Effort: Pulmonary effort is normal.     Breath sounds: Normal breath sounds.     Comments: His lungs sound clear bilaterally.  He has good air movement bilaterally.  I do not hear any wheezing. Abdominal:     General: Bowel sounds are normal.     Palpations: Abdomen is soft.  Musculoskeletal:        General: No tenderness or deformity. Normal range of motion.     Cervical back: Normal range of motion.  Lymphadenopathy:     Cervical: No cervical adenopathy.  Skin:    General: Skin is warm and dry.     Findings: No erythema or rash.  Neurological:     Mental Status: He is alert and oriented to person, place, and time.  Psychiatric:        Behavior: Behavior normal.        Thought Content: Thought content normal.        Judgment: Judgment normal.     Lab Results  Component Value Date   WBC 7.2 06/21/2023   HGB 13.1 06/21/2023   HCT 41.7 06/21/2023   MCV 89.7 06/21/2023   PLT 273 06/21/2023     Chemistry      Component Value Date/Time   NA 137 03/02/2023 0958   NA 142 11/29/2020 0000   K 5.0 03/02/2023 0958   CL 99 03/02/2023 0958   CO2 33 (H) 03/02/2023 0958   BUN 19 03/02/2023 0958   BUN 19 11/29/2020 0000   CREATININE 1.49 (H) 03/02/2023 0958   GLU 95 11/29/2020 0000      Component Value Date/Time   CALCIUM 9.8 03/02/2023 0958   ALKPHOS 86 03/02/2023 0958   AST 37 03/02/2023 0958   ALT 26 03/02/2023 0958   BILITOT 0.6 03/02/2023 0958      Impression and Plan: Mr. Dort is a very nice 89 year old white male.  He has metastatic thyroid  cancer.  This is a differentiated thyroid  cancer.   Thankfully it has been responsive to the radioactive iodine.   Hopefully, him taking his Synthroid  will help with the thyroid  cancer again.  We really need to see how his thyroid  cancer looks.  I will have to set him up with  a CT scan.  Will see back in the CT scan in about a month.  Again I know that he is trying to help his poor wife.  She is incredibly delightful whenever she comes to be with him for his appointments.  I thoroughly enjoy talking with her.  I am just incredibly impressed as to how well he has done.  Again, really looks like the basal cell carcinoma under the right eye has not had any recurrence.  I will see him back myself in 6 weeks or so.  6 months   Ivor Mars, MD 5/8/20259:47 AM

## 2023-06-21 NOTE — Telephone Encounter (Signed)
-----   Message from Ivor Mars sent at 06/21/2023  2:09 PM EDT ----- Please call and let her know that the iron level is actually okay.  Twilla Galea

## 2023-06-21 NOTE — Telephone Encounter (Signed)
 Patient notified per order of Dr. Maria Shiner that "the iron level is actually okay." Pt is appreciate of call and has no questions or concerns at this time.

## 2023-07-07 LAB — THYROGLOBULIN LEVEL: Thyroglobulin: 413 ng/mL — ABNORMAL HIGH

## 2023-07-17 ENCOUNTER — Ambulatory Visit (INDEPENDENT_AMBULATORY_CARE_PROVIDER_SITE_OTHER)

## 2023-07-17 DIAGNOSIS — Z7901 Long term (current) use of anticoagulants: Secondary | ICD-10-CM

## 2023-07-17 LAB — POCT INR: INR: 1.3 — AB (ref 2.0–3.0)

## 2023-07-17 NOTE — Progress Notes (Addendum)
 Pt denies any changes or missing any doses. Increase dose today to take 1 1/2 tablets and increase dose tomorrow to take 1 1/2 tablets and then continue 1 tablet daily. Recheck in 1 weeks.    Medical screening examination/treatment/procedure(s) were performed by non-physician practitioner and as supervising physician I was immediately available for consultation/collaboration.  I agree with above. Adelaide Holy, MD

## 2023-07-17 NOTE — Patient Instructions (Addendum)
 Pre visit review using our clinic review tool, if applicable. No additional management support is needed unless otherwise documented below in the visit note.  Increase dose today to take 1 1/2 tablets and increase dose tomorrow to take 1 1/2 tablets and then continue 1 tablet daily. Recheck in 1 weeks.

## 2023-07-19 ENCOUNTER — Telehealth: Payer: Self-pay

## 2023-07-19 ENCOUNTER — Other Ambulatory Visit (HOSPITAL_BASED_OUTPATIENT_CLINIC_OR_DEPARTMENT_OTHER): Payer: Self-pay

## 2023-07-19 DIAGNOSIS — Z7901 Long term (current) use of anticoagulants: Secondary | ICD-10-CM

## 2023-07-19 MED ORDER — WARFARIN SODIUM 5 MG PO TABS
ORAL_TABLET | ORAL | 1 refills | Status: DC
  Filled 2023-07-19: qty 100, 90d supply, fill #0

## 2023-07-19 NOTE — Telephone Encounter (Signed)
 Pt LVM he needs refill of warfarin. Pt is compliant with warfarin management and PCP apts.   Appeared pt had refill at pharmacy. Contacted pharmacy and spoke with Sherline Distel. She requested a new script since pt dosing has changed. Advised new script would be sent. They will work on filling it now.  They requested new script due to change in dosing.  Contacted pt and advised they are working on refill. Pt reported he realized today that he has not taken warfarin for several weeks. He did not know he was out of warfarin and his wife thought she was giving it to him but it was a different medication. Advised pt script should be ready today. He will pick up today and restart tonight. Pt has INR check in one week. Reminded pt of apt. Advised pt to watch for s/s of a clot. Pt denies any s/s. Pt verbalized understanding.

## 2023-07-23 ENCOUNTER — Ambulatory Visit
Admission: RE | Admit: 2023-07-23 | Discharge: 2023-07-23 | Disposition: A | Discharge status: Home / Self Care | Source: Ambulatory Visit | Attending: Hematology & Oncology | Admitting: Hematology & Oncology

## 2023-07-23 DIAGNOSIS — C7802 Secondary malignant neoplasm of left lung: Secondary | ICD-10-CM | POA: Diagnosis not present

## 2023-07-23 DIAGNOSIS — C73 Malignant neoplasm of thyroid gland: Secondary | ICD-10-CM | POA: Diagnosis not present

## 2023-07-23 DIAGNOSIS — C441122 Basal cell carcinoma of skin of right lower eyelid, including canthus: Secondary | ICD-10-CM

## 2023-07-23 DIAGNOSIS — C7801 Secondary malignant neoplasm of right lung: Secondary | ICD-10-CM | POA: Diagnosis not present

## 2023-07-23 MED ORDER — IOPAMIDOL (ISOVUE-300) INJECTION 61%
75.0000 mL | Freq: Once | INTRAVENOUS | Status: AC | PRN
  Administered 2023-07-23: 75 mL via INTRAVENOUS

## 2023-07-26 DIAGNOSIS — C73 Malignant neoplasm of thyroid gland: Secondary | ICD-10-CM | POA: Diagnosis not present

## 2023-07-26 DIAGNOSIS — Z1330 Encounter for screening examination for mental health and behavioral disorders, unspecified: Secondary | ICD-10-CM | POA: Diagnosis not present

## 2023-07-26 DIAGNOSIS — C799 Secondary malignant neoplasm of unspecified site: Secondary | ICD-10-CM | POA: Diagnosis not present

## 2023-07-26 DIAGNOSIS — E890 Postprocedural hypothyroidism: Secondary | ICD-10-CM | POA: Diagnosis not present

## 2023-07-27 ENCOUNTER — Ambulatory Visit

## 2023-07-30 NOTE — Telephone Encounter (Signed)
 Pt missed apt on 6/13 for coumadin  clinic.  Contacted pt and RS apt for 6/20. Pt verbalized understanding.

## 2023-08-03 ENCOUNTER — Ambulatory Visit

## 2023-08-07 ENCOUNTER — Telehealth: Payer: Self-pay | Admitting: Internal Medicine

## 2023-08-07 ENCOUNTER — Ambulatory Visit

## 2023-08-07 NOTE — Telephone Encounter (Signed)
 Patient would like a call back from [facility] to reschedule his coumadin  clinic appointment. Best callback is [phone].

## 2023-08-08 NOTE — Telephone Encounter (Signed)
 Pt has been contacted and RS for coumadin  clinic apt.

## 2023-08-10 ENCOUNTER — Ambulatory Visit

## 2023-08-16 ENCOUNTER — Inpatient Hospital Stay: Admitting: Hematology & Oncology

## 2023-08-16 ENCOUNTER — Inpatient Hospital Stay

## 2023-08-18 IMAGING — CT CT HEAD W/O CM
3 series · 15 of 37 positions shown, 17 images · non-contrast
Comparison: None.

CLINICAL DATA: Head trauma, minor (Age >= 65y); Neck trauma (Age >=
65y); Facial trauma, blunt. Mechanical fall on thinners, fell up the
steps AND hit face.

EXAM:
CT HEAD WITHOUT CONTRAST
CT MAXILLOFACIAL WITHOUT CONTRAST
CT CERVICAL SPINE WITHOUT CONTRAST
TECHNIQUE: Multidetector CT imaging of the head, cervical spine, and
maxillofacial structures were performed using the standard protocol
without intravenous contrast. Multiplanar CT image reconstructions
of the cervical spine and maxillofacial structures were also
generated.

[Series 3: head without · axial · non-contrast · 0.47mm/px · z∈[+1123,+1248]mm · 7 of 35 slices shown, 9 images]
[im 5/35  brain]
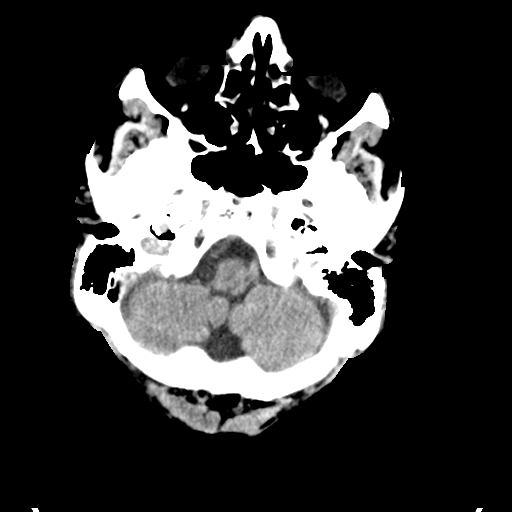
[im 5/35  bone]
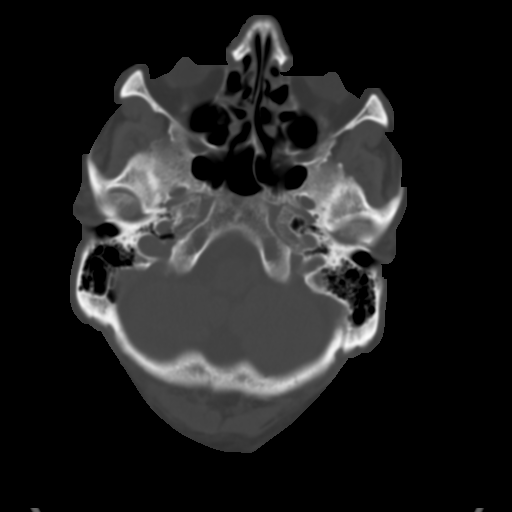
[im 9/35  brain]
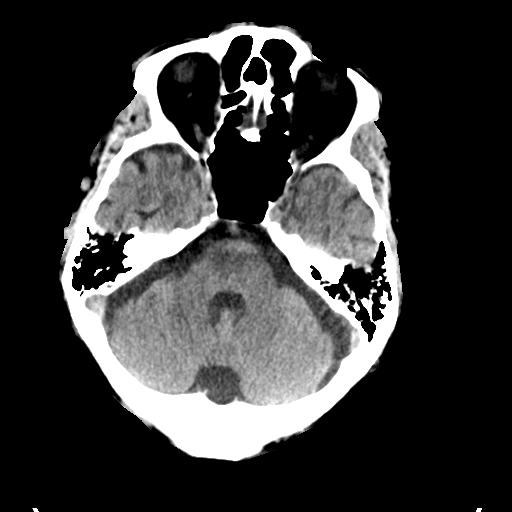
[im 13/35  brain]
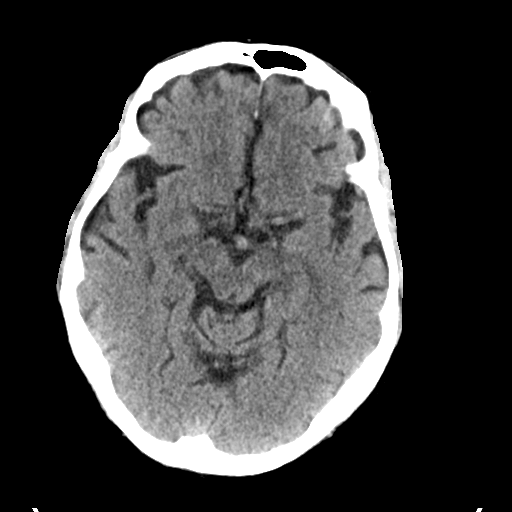
[im 18/35  brain]
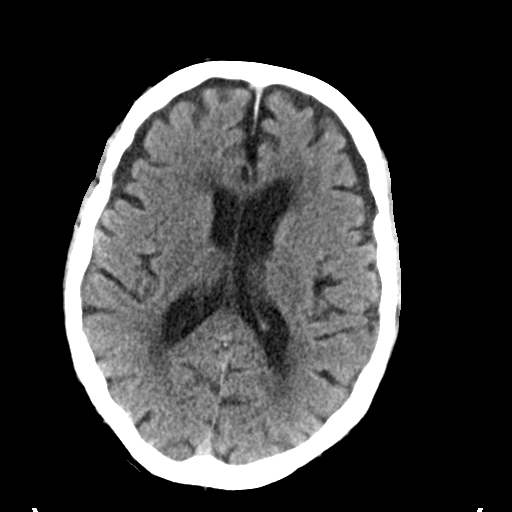
[im 22/35  brain]
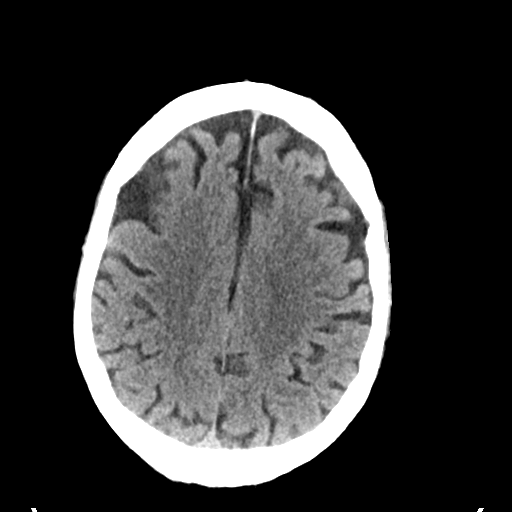
[im 22/35  bone]
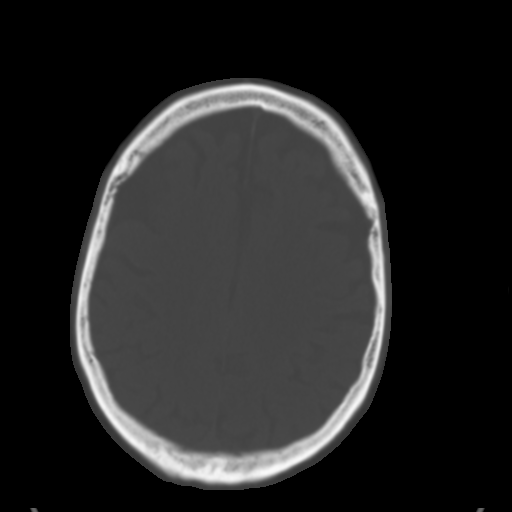
[im 26/35  brain]
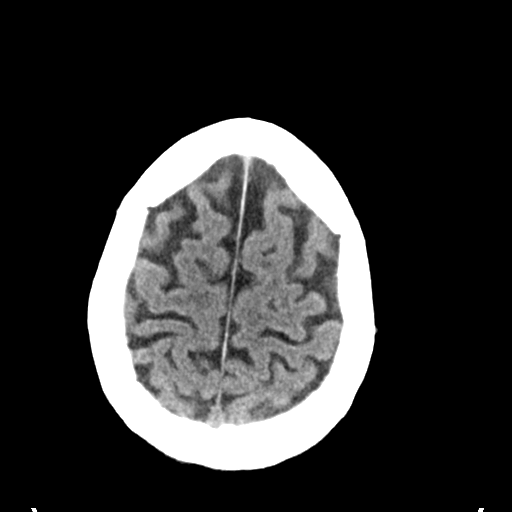
[im 30/35  brain]
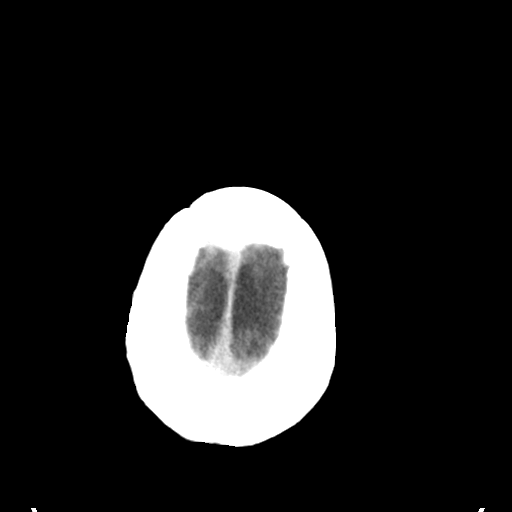

[Series 4: head bone · axial · 0.47mm/px · z∈[+1119,+1197]mm · 5 of 88 slices shown]
[im 9/88  bone]
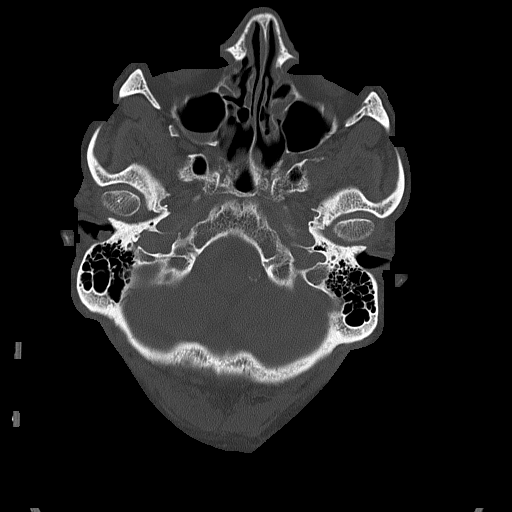
[im 18/88  bone]
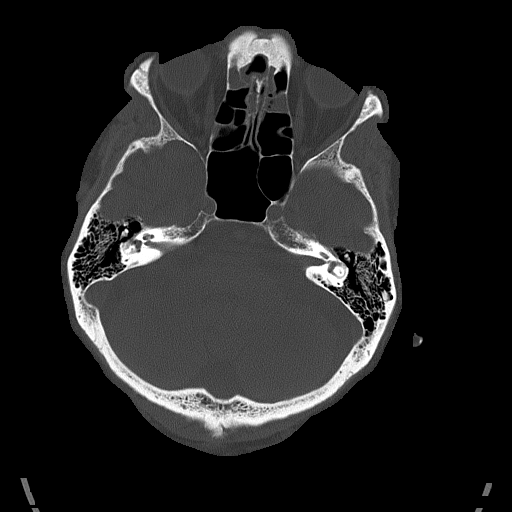
[im 27/88  bone]
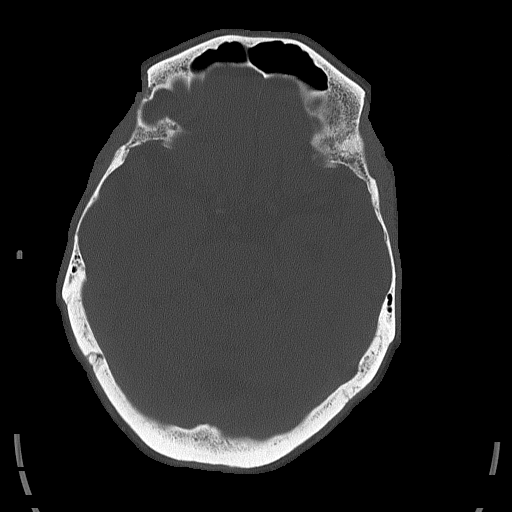
[im 40/88  bone]
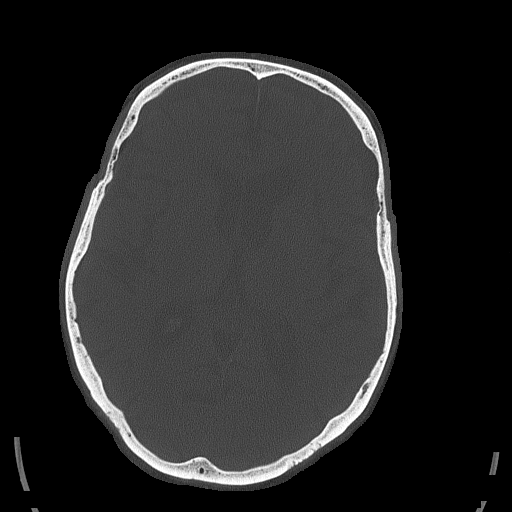
[im 48/88  bone]
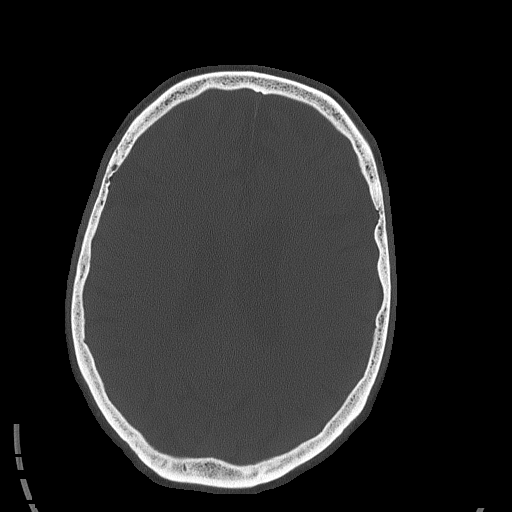

[Series 6: head without sag · sagittal · non-contrast · 0.35mm/px · 3 of 59 slices shown]
[im 20/59  brain]
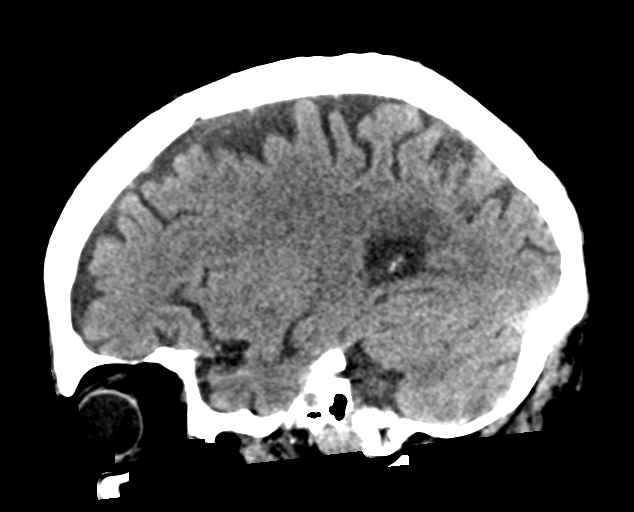
[im 30/59  brain]
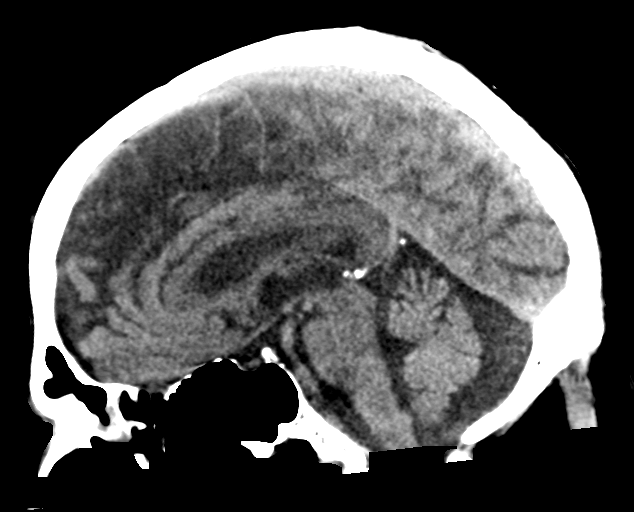
[im 39/59  brain]
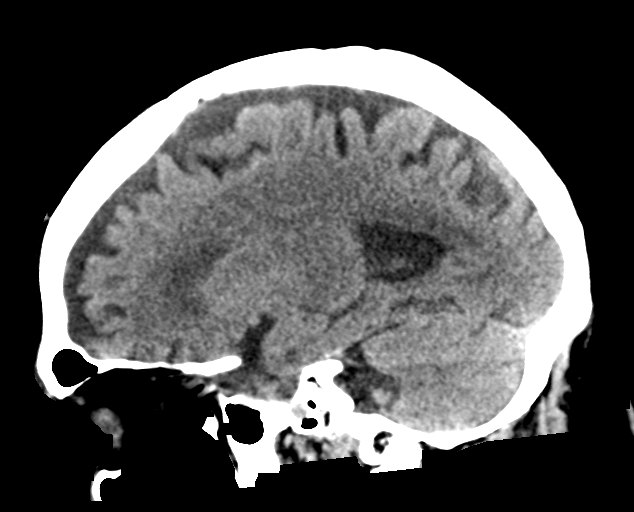

[15 of 37 positions shown; findings below may reference images not displayed]

FINDINGS: CT HEAD FINDINGS

BRAIN:
BRAIN
Cerebral ventricle sizes are concordant with the degree of cerebral
volume loss. Patchy and confluent areas of decreased attenuation are
noted throughout the deep and periventricular white matter of the
cerebral hemispheres bilaterally, compatible with chronic
microvascular ischemic disease.

No evidence of large-territorial acute infarction. No parenchymal
hemorrhage. No mass lesion. No extra-axial collection.

No mass effect or midline shift. No hydrocephalus. Basilar cisterns
are patent.

Vascular: No hyperdense vessel. Atherosclerotic calcifications are
present within the cavernous internal carotid and vertebral
arteries.

Skull: No acute fracture or focal lesion.

Other: None.

CT MAXILLOFACIAL FINDINGS

Osseous: Acute minimally displaced right anterior maxillary wall
fracture.

Sinuses/Orbits: Mucosal thickening of the right maxillary sinus.
Otherwise paranasal sinuses and mastoid air cells are clear. The
orbits are unremarkable.

Soft tissues: Mild right maxillary subcutaneus soft tissue edema
with no large hematoma formation.

CT CERVICAL SPINE FINDINGS

Alignment: Straightening of the normal cervical lordosis likely due
to positioning and surgical hardware.

Skull base and vertebrae: C3-C4 anterior cervical spine fusion
surgical hardware. C5-C7 anterior cervical spine fusion surgical
hardware. Multilevel osseous neuroforaminal stenosis. No acute
fracture. No aggressive appearing focal osseous lesion or focal
pathologic process.

Soft tissues and spinal canal: No prevertebral fluid or swelling. No
visible canal hematoma.

Upper chest: Unremarkable.

Other: None.
IMPRESSION: 1.  No acute intracranial abnormality.
2. Acute minimally displaced right anterior maxillary wall fracture.
3. No acute displaced fracture or traumatic listhesis of the
cervical spine.
4. C3-C4 anterior cervical spine fusion surgical hardware. C5-C7
anterior cervical spine fusion surgical hardware. Multilevel osseous
neuroforaminal stenosis.

## 2023-08-23 NOTE — Telephone Encounter (Signed)
 Pt missed last coumadin  clinic apt. Contacted pt and he agreed to an apt for tomorrow. Pt verbalized understanding.

## 2023-08-24 ENCOUNTER — Telehealth: Payer: Self-pay

## 2023-08-24 ENCOUNTER — Other Ambulatory Visit: Payer: Self-pay

## 2023-08-24 ENCOUNTER — Ambulatory Visit (INDEPENDENT_AMBULATORY_CARE_PROVIDER_SITE_OTHER)

## 2023-08-24 ENCOUNTER — Ambulatory Visit: Payer: Self-pay | Admitting: Family Medicine

## 2023-08-24 ENCOUNTER — Other Ambulatory Visit (INDEPENDENT_AMBULATORY_CARE_PROVIDER_SITE_OTHER)

## 2023-08-24 DIAGNOSIS — Z7901 Long term (current) use of anticoagulants: Secondary | ICD-10-CM | POA: Diagnosis not present

## 2023-08-24 LAB — PROTIME-INR
INR: 10.3 ratio (ref 0.8–1.0)
Prothrombin Time: 98.4 s (ref 9.6–13.1)

## 2023-08-24 LAB — POCT INR: INR: 8 — AB (ref 2.0–3.0)

## 2023-08-24 MED ORDER — PHYTONADIONE 5 MG PO TABS: 2.5000 mg | ORAL_TABLET | Freq: Once | ORAL | 0 refills | Status: AC

## 2023-08-24 NOTE — Patient Instructions (Addendum)
 Pre visit review using our clinic review tool, if applicable. No additional management support is needed unless otherwise documented below in the visit note.  Hold all warfarin until recheck INR on 7/15.  If you have any signs or symptoms of abnormal bruising or bleeding go to the ER.

## 2023-08-24 NOTE — Progress Notes (Signed)
 Please let me know where we can get vitamin K for patient or if we need to send him to the ED. Thanks.

## 2023-08-24 NOTE — Progress Notes (Signed)
 Placed STAT INR lab order.

## 2023-08-24 NOTE — Telephone Encounter (Signed)
 Contacted pharmacy to inquire if pt has picked up script. They report the script has not been filled yet. Advised the urgency of the medication so pt could pick up today. She verbalized understanding and reports it will be filled shortly and pt will receive a test msg that it is ready.

## 2023-08-24 NOTE — Telephone Encounter (Signed)
 Claudene Tobias PARAS, CMA to Me  (Selected Message)   08/24/23 12:51 PM Darice from lab called to report critical values today:  98.4 Protime 10.3 INR

## 2023-08-24 NOTE — Progress Notes (Signed)
 Pt reported after last INR check of 1.3 that realized he had not taken warfarin for several weeks due to confusion on medications.  Pt denies any bleeding currently. Pt reports he has been eating grapefruit every morning. Educated pt he cannot have grapefruit due to major interaction with warfarin. Pt reports he did not know this. Advised if any s/s of abnormal bruising or bleeding to go to ER. Pt verbalized understanding.  Pt will have lab INR drawn today to verify exact INR.  Hold all warfarin until recheck INR on 7/15.  If you have any signs or symptoms of abnormal bruising or bleeding go to the ER.  STAT lab order was placed and pt was escorted downstairs to the lab. Advised pt this nurse will f/u with him this afternoon when result is received. Pt verbalized understanding.   LVM with pt's wife due to her managing pt's medication and pt cannot verify how much or if he is taking warfarin. He did report he thinks the bottle says coumadin .

## 2023-08-24 NOTE — Telephone Encounter (Signed)
 Pt in today for coumadin  clinic. INR is > 8.0 on POCT today and pt was sent for STAT INR lab draw.  Lab INR came back 10.3  Last INR check, on 6/3,  pt's INR was 1.3 and pt was supposed to return the following week for INR check.  Pt reported after that INR check that he and his wife got confused about his medication and does not think he took any warfarin for at least a couple of weeks before that apt. Pt NS the following week coumadin  clinic apt and every coumadin  clinic apt since 6/3, until today.  Today he reported he is not sure if he has been taking any ibuprofen because my wife would give it to me.  He did report after several questions to identify why pt has supratherapeutic INR today that he has been eating grapefruit every morning. He reported he has always done this for breakfast and that has not changed. Pt has not had any critical INR readings in the past and with grapefruit being a major interaction with it would have shown in INR readings in the past.   There has been progressively worsening memory with pt over the last several months. He has no-showed the majority of his coumadin  clinic apt and has to be contacted to reschedule them because he does not remember he had an apt. He has no-showed the last 2 coumadin  clinic apts and is having even more difficulty remember any of his apts over the last few months, after review of chart.  With labile INR results, decreasing memory and missing the majority of his coumadin  clinic apts, it would be safer for the pt to use a DOAC for anticoagulation going forward,due to fewer interactions with DOACs and no monitoring. Not sure if pt has ever been on a DOAC and am hoping cost is not prohibitive for this pt.  There is no record in his medication list that he was ever taking a DOAC.  Forwarding msg to PCP and clinic pharmacist with recommendation to transition pt to DOAC if not cost prohibitive and if PCP is in agreement. Hopefully pharmacist can  help with identifying cost of DOAC for pt.   Indication for anticoagulation is PE, 2005 and 2007, and protein C deficiency.  Transitioning to a DOAC has not been discussed with pt to avoid any confusion today with supratherapeutic INR.

## 2023-08-24 NOTE — Telephone Encounter (Signed)
 Noted, working on [employer] for availability of vitamin K (Mephyton , phytonadione  ) tablet, 5 mg.   Contacted pt's preferred pharmacy, [facility], [place] and they do have it.   Contacted pt and advised  of lab INR result. Pt still denies any abnormal bruising or bleeding. Advised pt he will need to pick up the script and take 1/2 tablet and put the other half away and that he will not need the other half. Pt said he can pick up script this afternoon. Advised it would be best to recheck INR on Monday at [facility] instead of Tuesday at [facility]. Pt agreed and apt was scheduled for coumadin  clinic for Monday, 7/14 at [facility].   Advised pt if he has any s/s of abnormal bruising or bleeding to go to the ER or call 911. Advised  pt he is at increased risk for bleeding so take precautions to reduce risk of falls and any accidental cuts or abrasions. Pt verbalized understanding and was appreciative of the help. Advised if any questions after hours or over the weekend to call the main number to the clinic and the call will go to a nurse line. Pt verbalized understanding.   Sent in script for phytonadione .

## 2023-08-27 ENCOUNTER — Ambulatory Visit (INDEPENDENT_AMBULATORY_CARE_PROVIDER_SITE_OTHER)

## 2023-08-27 DIAGNOSIS — Z7901 Long term (current) use of anticoagulants: Secondary | ICD-10-CM | POA: Diagnosis not present

## 2023-08-27 LAB — POCT INR: INR: 2.6 (ref 2.0–3.0)

## 2023-08-27 NOTE — Telephone Encounter (Signed)
 Noted.  I saw the patient last on 02/23/2021.  She should make an appointment to see me.  Thank you

## 2023-08-27 NOTE — Patient Instructions (Addendum)
 Pre visit review using our clinic review tool, if applicable. No additional management support is needed unless otherwise documented below in the visit note.  If you have any signs or symptoms of abnormal bruising or bleeding go to the ER.  Continue 1 tablet daily. Recheck on Friday, 7/18 at 12:30.

## 2023-08-27 NOTE — Telephone Encounter (Signed)
 Pt was in for INR check today with his wife [name], to help clarify what medication and dosing pt should be taking. Pt has held warfarin since 7/11 and did take the 1/2 tablet of vitamin K picked up on 7/11.  INR today is 2.6, and pt will restart prior warfarin dosing of 5 mg daily and will recheck INR on 7/18.  She reports pt is only taking levothyroxine , 200 mcg, daily with breakfast and warfarin, 5 mg, daily 30 minutes before breakfast. Educated pt and his wife that he should be taking the levothyroxine  30 minutes before eating, and not warfarin. Advised warfarin can be taken with or without food. They both reported pt has had the two medications backwards on when the medications should be taken. Advised again how much and when to take each medication and the importance of each. Per Dr. Karoline, endocrinologist, last OV note on 07/26/23, pt was to continue 200 mcg daily. Printed medication list and marked through all medications except 200 mcg levothyroxine  and 5 mg warfarin and wrote to take levothyroxine  30 minutes before breakfast.   Educated pt and his wife on potential to transition to Eliquis and the benefits that may help pt remain protected from blood clots and also protected from increased bleeding risk. Advised cost would be $47/month. Advised pt needs apt with PCP to discuss this further. They agreed and apt with PCP was made for 7/17.   Pt reports he has not eaten any grapefruit since stopping warfarin on 7/11. Advised he could not eat grapefruit any longer as long as he is taking warfarin. Printed all upcoming apts for pt to take home and went over each apt date and time.

## 2023-08-27 NOTE — Progress Notes (Signed)
 INR on 7/11 was 10.3. Pt denied any bleeding at that time. Pt was prescribed 2.5 mg mephytoin and ordered to hold all warfarin. Cause of supratherapeutic INR is thought to be pt eating grapefruit every morning.  Msg was sent to PCP last week concerning pt labile INR and difficulty remembering apts and taking medication. Will make apt with PCP to discuss potentially transitioning pt to Eliquis.  Pt was in for INR check today with his wife [name], to help clarify what medication and dosing pt should be taking. She reports pt is only taking levothyroxine , 200 mcg, daily with breakfast and warfarin, 5 mg, daily 30 minutes before breakfast before warfarin was stopped on 7/11 due to supratherapeutic INR.  Educated pt and his wife that he should be taking the levothyroxine  30 minutes before eating, and not warfarin. Advised warfarin can be taken with or without food. They both reported pt has had the two medications backwards. Advised again how much and when to take each medication and the importance of each. Per Dr. Karoline, endocrinologist, last OV note on 6/12/, pt was to continue 200 mcg daily. Printed medication list and marked through all medications except 200 mcg levothyroxine  and 5 mg warfarin and wrote to take levothyroxine  30 minutes before breakfast.  Educated pt and his wife on potential to transition to Eliquis and the benefits that may help pt remain protected from blood clots and also protected from increased bleeding risk. Advised cost would be $47/month. Advised pt needs apt with PCP to discuss this further. They agreed and apt with PCP was made for 7/17.  Pt reports he has not eaten any grapefruit since stopping warfarin on 7/11. Advised he could not eat grapefruit any longer as long as he is taking warfarin. Printed all upcoming apts for pt to take home and went over each apt date and time. If you have any signs or symptoms of abnormal bruising or bleeding go to the ER.  Continue 1 tablet daily.  Recheck on Friday, 7/18 at 12:30. Msg has been sent to PCP to update.

## 2023-08-27 NOTE — Telephone Encounter (Signed)
 Pt NS his coumadin  clinic apt this morning.  Contacted pt he agreed to be RS at [facility] for this afternoon.   Will try to schedule pt for apt with PCP to discuss medications, when in today for INR check.

## 2023-08-28 ENCOUNTER — Ambulatory Visit

## 2023-08-29 NOTE — Telephone Encounter (Signed)
 Noted! Thank you

## 2023-08-30 ENCOUNTER — Encounter: Payer: Self-pay | Admitting: Internal Medicine

## 2023-08-30 ENCOUNTER — Ambulatory Visit (INDEPENDENT_AMBULATORY_CARE_PROVIDER_SITE_OTHER): Admitting: Internal Medicine

## 2023-08-30 VITALS — BP 131/69 | HR 63 | Temp 97.9°F | Ht 75.0 in | Wt 184.0 lb

## 2023-08-30 DIAGNOSIS — D6859 Other primary thrombophilia: Secondary | ICD-10-CM | POA: Diagnosis not present

## 2023-08-30 DIAGNOSIS — Z7901 Long term (current) use of anticoagulants: Secondary | ICD-10-CM

## 2023-08-30 DIAGNOSIS — E890 Postprocedural hypothyroidism: Secondary | ICD-10-CM | POA: Diagnosis not present

## 2023-08-30 DIAGNOSIS — C73 Malignant neoplasm of thyroid gland: Secondary | ICD-10-CM | POA: Diagnosis not present

## 2023-08-30 DIAGNOSIS — L84 Corns and callosities: Secondary | ICD-10-CM | POA: Diagnosis not present

## 2023-08-30 NOTE — Progress Notes (Signed)
Subjective:  Patient ID: Clarence Hopkins, male    DOB: [birth year]  Age: 89 y.o. MRN: [id number]  CC: Medical Management of Chronic Issues (Discuss medication... Also pt has been having rt foot pain a/w swelling)   HPI [name] presents for anticoagulation, hypothyroidism, wt loss  Per Clotilda: Educated pt and his wife on potential to transition to Eliquis and the benefits that may help pt remain protected from blood clots and also protected from increased bleeding risk. Advised cost would be $47/month. Advised pt needs apt with PCP to discuss this further. They agreed and apt with PCP was made for 7/17.  Pt reports he has not eaten any grapefruit since stopping warfarin on 7/11. Advised he could not eat grapefruit any longer as long as he is taking warfarin.   Alm wants to stay on Coumadin    Outpatient Medications Prior to Visit  Medication Sig Dispense Refill   Cholecalciferol 50 MCG (2000 UT) CAPS Take 2,000 Units by mouth. (Patient not taking: Reported on 08/31/2023)     Ibuprofen-diphenhydrAMINE Cit (ADVIL PM PO) Take by mouth at bedtime as needed. (Patient not taking: Reported on 08/31/2023)     levothyroxine  (SYNTHROID ) 200 MCG tablet Take 1 tablet (200 mcg total) by mouth daily with 25mcg 90 tablet 3   levothyroxine  (SYNTHROID ) 25 MCG tablet Take 1 tablet (25 mcg total) by mouth daily. 90 tablet 3   phytonadione  (VITAMIN K) 5 MG tablet Take 5 mg by mouth once. (Patient not taking: Reported on 08/31/2023)     Polyethylene Glycol 3350  (MIRALAX  PO) Take by mouth daily. (Patient not taking: Reported on 08/31/2023)     Vibegron  (GEMTESA ) 75 MG TABS Take 75 mg by mouth. (Patient not taking: Reported on 08/31/2023)     warfarin (COUMADIN ) 5 MG tablet TAKE 1 TABLET BY MOUTH DAILY OR AS DIRECTED BY ANTICOAGULATION CLINIC. 100 tablet 1   finasteride  (PROSCAR ) 5 MG tablet Take 1 tablet (5 mg total) by mouth daily. (Patient not taking: Reported on 08/30/2023) 90 tablet 3   levothyroxine   (SYNTHROID ) 200 MCG tablet Take 1 tablet (200 mcg total) by mouth daily for a total of 225mcg per day. (Patient not taking: Reported on 08/30/2023) 90 tablet 3   levothyroxine  (SYNTHROID ) 25 MCG tablet Take 25 mcg by mouth daily. Take with 200 mcg tablet to total 225 mcg dose by Dr. Beryl (Patient not taking: Reported on 08/30/2023)     tamsulosin  (FLOMAX ) 0.4 MG CAPS capsule Take 1 capsule (0.4 mg total) by mouth at bedtime. (Patient not taking: Reported on 08/30/2023) 30 capsule 0   No facility-administered medications prior to visit.    ROS: Review of Systems  Constitutional:  Positive for fatigue and unexpected weight change. Negative for appetite change.  HENT:  Negative for congestion, nosebleeds, sneezing, sore throat and trouble swallowing.   Eyes:  Negative for itching and visual disturbance.  Respiratory:  Negative for cough.   Cardiovascular:  Negative for chest pain, palpitations and leg swelling.  Gastrointestinal:  Negative for abdominal distention, blood in stool, diarrhea, nausea and vomiting.  Genitourinary:  Negative for frequency and hematuria.  Musculoskeletal:  Positive for arthralgias and gait problem. Negative for back pain, joint swelling and neck pain.  Skin:  Negative for rash.  Neurological:  Positive for weakness. Negative for dizziness, tremors and speech difficulty.  Psychiatric/Behavioral:  Negative for agitation, dysphoric mood and sleep disturbance. The patient is not nervous/anxious.     Objective:  BP 131/69   Pulse 63  Temp 97.9 F (36.6 C) (Oral)   Ht 6' 3 (1.905 m)   Wt 184 lb (83.5 kg)   SpO2 97%   BMI 23.00 kg/m   BP Readings from Last 3 Encounters:  08/31/23 (P) 98/75  08/30/23 131/69  06/21/23 (!) 143/87    Wt Readings from Last 3 Encounters:  08/31/23 (P) 183 lb (83 kg)  08/30/23 184 lb (83.5 kg)  06/21/23 191 lb (86.6 kg)    Physical Exam Constitutional:      General: He is not in acute distress.    Appearance: He is  well-developed. He is not ill-appearing.     Comments: NAD  Eyes:     Conjunctiva/sclera: Conjunctivae normal.     Pupils: Pupils are equal, round, and reactive to light.  Neck:     Thyroid : No thyromegaly.     Vascular: No JVD.  Cardiovascular:     Rate and Rhythm: Normal rate and regular rhythm.     Heart sounds: Normal heart sounds. No murmur heard.    No friction rub. No gallop.  Pulmonary:     Effort: Pulmonary effort is normal. No respiratory distress.     Breath sounds: Normal breath sounds. No wheezing or rales.  Chest:     Chest wall: No tenderness.  Abdominal:     General: Bowel sounds are normal. There is no distension.     Palpations: Abdomen is soft. There is no mass.     Tenderness: There is no abdominal tenderness. There is no guarding or rebound.  Musculoskeletal:        General: No tenderness. Normal range of motion.     Cervical back: Normal range of motion.     Right lower leg: No edema.     Left lower leg: No edema.  Lymphadenopathy:     Cervical: No cervical adenopathy.  Skin:    General: Skin is warm and dry.     Findings: No rash.  Neurological:     Mental Status: He is alert and oriented to person, place, and time. Mental status is at baseline.     Cranial Nerves: No cranial nerve deficit.     Motor: No abnormal muscle tone.     Coordination: Coordination normal.     Gait: Gait normal.     Deep Tendon Reflexes: Reflexes are normal and symmetric.  Psychiatric:        Behavior: Behavior normal.        Thought Content: Thought content normal.        Judgment: Judgment normal.   R foot callus -very deep and painful  Procedure:   foot callus paring/cutting  Indication:   foot callus, painful  Consent: verbal  Risks and benefits were explained to the patient. Skin was cleaned with alcohol . I removed a large callus carefully with a round blade. Skin remained intact. Pain is much better.  Tolerated well. Complications: none. Bandaid  applied   Lab Results  Component Value Date   WBC 7.0 08/31/2023   HGB 11.8 (L) 08/31/2023   HCT 37.2 (L) 08/31/2023   PLT 282 08/31/2023   GLUCOSE 104 (H) 08/31/2023   CHOL 141 03/14/2019   TRIG 74.0 03/14/2019   HDL 38.00 (L) 03/14/2019   LDLDIRECT 116.0 03/31/2013   LDLCALC 88 03/14/2019   ALT 10 08/31/2023   AST 16 08/31/2023   NA 138 08/31/2023   K 4.5 08/31/2023   CL 102 08/31/2023   CREATININE 1.00 08/31/2023   BUN 15 08/31/2023  CO2 31 08/31/2023   TSH 0.795 08/31/2023   PSA 9.86 (H) 03/14/2019   INR 3.7 (A) 08/31/2023    CT Chest W Contrast Result Date: 07/27/2023 CLINICAL DATA:  Follow-up thyroid  cancer EXAM: CT CHEST WITH CONTRAST TECHNIQUE: Multidetector CT imaging of the chest was performed during intravenous contrast administration. RADIATION DOSE REDUCTION: This exam was performed according to the departmental dose-optimization program which includes automated exposure control, adjustment of the mA and/or kV according to patient size and/or use of iterative reconstruction technique. CONTRAST:  75mL ISOVUE -300 IOPAMIDOL  (ISOVUE -300) INJECTION 61% COMPARISON:  02/08/2023 FINDINGS: Cardiovascular: The heart is normal in size. No pericardial effusion. No evidence of thoracic aortic aneurysm. Atherosclerotic calcifications of the aortic arch. Severe three-vessel coronary atherosclerosis. Mediastinum/Nodes: No suspicious mediastinal lymphadenopathy. Visualized thyroid  is not imaged/absent. Lungs/Pleura: Mild centrilobular and paraseptal emphysematous changes, upper lung predominant. 5.5 x 4.6 cm central right lower lobe/infrahilar mass (image 108), previously 4.9 x 4.4 cm. 3.8 x 3.3 cm medial left lower lobe mass (image 119), previously 3.5 x 3.1 cm. Additional scattered bilateral pulmonary nodules, measuring up to 1.7 cm in the right middle lobe (image 106), previously 1.6 cm. No focal consolidation. No pleural effusion or pneumothorax. Upper Abdomen: Scattered hepatic cysts  measuring up to 3.2 cm, benign. Large bilateral renal cysts measuring up to 12.1 cm on the right, benign. Musculoskeletal: Moderate degenerative changes of the visualized thoracolumbar spine. IMPRESSION: Progressive bilateral pulmonary metastases, as above. Aortic Atherosclerosis (ICD10-I70.0) and Emphysema (ICD10-J43.9). Electronically Signed   By: Pinkie Pebbles M.D.   On: 07/27/2023 23:22    Assessment & Plan:   Problem List Items Addressed This Visit     Postoperative hypothyroidism   Hypothyroid On Levothyroxine       Protein C deficiency (HCC) - Primary    Per Clotilda: Educated pt and his wife on potential to transition to Eliquis and the benefits that may help pt remain protected from blood clots and also protected from increased bleeding risk. Advised cost would be $47/month. Advised pt needs apt with PCP to discuss this further. They agreed and apt with PCP was made for 7/17.  Pt reports he has not eaten any grapefruit since stopping warfarin on 7/11. Advised he could not eat grapefruit any longer as long as he is taking warfarin.   Alm wants to stay on Coumadin       Long term (current) use of anticoagulants    Per Clotilda: Educated pt and his wife on potential to transition to Eliquis and the benefits that may help pt remain protected from blood clots and also protected from increased bleeding risk. Advised cost would be $47/month. Advised pt needs apt with PCP to discuss this further. They agreed and apt with PCP was made for 7/17.  Pt reports he has not eaten any grapefruit since stopping warfarin on 7/11. Advised he could not eat grapefruit any longer as long as he is taking warfarin.   Alm wants to stay on Coumadin       Thyroid  cancer (HCC)   Hypothyroid On Levothyroxine       Callus of foot   Right foot very deep and painful callus-see procedure         No orders of the defined types were placed in this encounter.     Follow-up: Return in about 3  months (around 11/19/2023) for a follow-up visit.  Marolyn Noel, MD

## 2023-08-30 NOTE — Assessment & Plan Note (Signed)
 Hypothyroid On Levothyroxine 

## 2023-08-30 NOTE — Assessment & Plan Note (Signed)
  Per Clotilda: Educated pt and his wife on potential to transition to Eliquis and the benefits that may help pt remain protected from blood clots and also protected from increased bleeding risk. Advised cost would be $47/month. Advised pt needs apt with PCP to discuss this further. They agreed and apt with PCP was made for 7/17.  Pt reports he has not eaten any grapefruit since stopping warfarin on 7/11. Advised he could not eat grapefruit any longer as long as he is taking warfarin.   Alm wants to stay on Coumadin

## 2023-08-30 NOTE — Patient Instructions (Addendum)
 While it's generally advised to limit or avoid grapefruit and grapefruit juice while taking warfarin (Coumadin ), it's not an absolute prohibition. Grapefruit contains compounds that can interfere with how your body processes warfarin, potentially increasing the risk of bleeding. However, some studies suggest that moderate consumption of grapefruit juice (e.g., 8 ounces, three times daily) may not cause significant issues. If you are taking warfarin, it's crucial to discuss grapefruit consumption with your doctor to determine the appropriate level of intake for you and to monitor your INR levels (a measure of how long it takes your blood to clot) regularly.  Here's a more detailed explanation: Mechanism of Interaction: Grapefruit contains furanocoumarins, which can inhibit certain enzymes in the liver that break down warfarin. This can lead to higher levels of warfarin in the blood, potentially increasing the risk of bleeding.  Individual Variability: The extent of the interaction can vary from person to person. Some individuals may be more sensitive to the effects of grapefruit than others.  Importance of Consistency: If you choose to consume grapefruit while taking warfarin, it's important to maintain a consistent intake to avoid fluctuations in INR levels.  Consult Your Doctor: It is essential to discuss grapefruit consumption with your doctor or pharmacist before making any changes to your diet, including adding or removing grapefruit. They can assess your individual risk and provide personalized recommendations.  Monitoring: Regular INR monitoring is crucial for individuals taking warfarin, especially if they are also consuming grapefruit. This allows your doctor to adjust your warfarin dosage as needed to maintain a safe and effective level of anticoagulation.

## 2023-08-31 ENCOUNTER — Ambulatory Visit (INDEPENDENT_AMBULATORY_CARE_PROVIDER_SITE_OTHER)

## 2023-08-31 ENCOUNTER — Inpatient Hospital Stay (HOSPITAL_BASED_OUTPATIENT_CLINIC_OR_DEPARTMENT_OTHER): Admitting: Hematology & Oncology

## 2023-08-31 ENCOUNTER — Inpatient Hospital Stay: Attending: Hematology & Oncology

## 2023-08-31 DIAGNOSIS — Z79890 Hormone replacement therapy (postmenopausal): Secondary | ICD-10-CM | POA: Insufficient documentation

## 2023-08-31 DIAGNOSIS — Z7901 Long term (current) use of anticoagulants: Secondary | ICD-10-CM | POA: Diagnosis not present

## 2023-08-31 DIAGNOSIS — C4481 Basal cell carcinoma of overlapping sites of skin: Secondary | ICD-10-CM | POA: Insufficient documentation

## 2023-08-31 DIAGNOSIS — C441122 Basal cell carcinoma of skin of right lower eyelid, including canthus: Secondary | ICD-10-CM

## 2023-08-31 DIAGNOSIS — C7800 Secondary malignant neoplasm of unspecified lung: Secondary | ICD-10-CM | POA: Diagnosis not present

## 2023-08-31 DIAGNOSIS — C73 Malignant neoplasm of thyroid gland: Secondary | ICD-10-CM

## 2023-08-31 DIAGNOSIS — D500 Iron deficiency anemia secondary to blood loss (chronic): Secondary | ICD-10-CM

## 2023-08-31 DIAGNOSIS — D509 Iron deficiency anemia, unspecified: Secondary | ICD-10-CM | POA: Insufficient documentation

## 2023-08-31 LAB — CBC WITH DIFFERENTIAL (CANCER CENTER ONLY)
Abs Immature Granulocytes: 0.04 K/uL (ref 0.00–0.07)
Basophils Absolute: 0 K/uL (ref 0.0–0.1)
Basophils Relative: 1 %
Eosinophils Absolute: 0.1 K/uL (ref 0.0–0.5)
Eosinophils Relative: 2 %
HCT: 37.2 % — ABNORMAL LOW (ref 39.0–52.0)
Hemoglobin: 11.8 g/dL — ABNORMAL LOW (ref 13.0–17.0)
Immature Granulocytes: 1 %
Lymphocytes Relative: 26 %
Lymphs Abs: 1.8 K/uL (ref 0.7–4.0)
MCH: 28.4 pg (ref 26.0–34.0)
MCHC: 31.7 g/dL (ref 30.0–36.0)
MCV: 89.4 fL (ref 80.0–100.0)
Monocytes Absolute: 0.7 K/uL (ref 0.1–1.0)
Monocytes Relative: 10 %
Neutro Abs: 4.4 K/uL (ref 1.7–7.7)
Neutrophils Relative %: 60 %
Platelet Count: 282 K/uL (ref 150–400)
RBC: 4.16 MIL/uL — ABNORMAL LOW (ref 4.22–5.81)
RDW: 12.8 % (ref 11.5–15.5)
WBC Count: 7 K/uL (ref 4.0–10.5)
nRBC: 0 % (ref 0.0–0.2)

## 2023-08-31 LAB — CMP (CANCER CENTER ONLY)
ALT: 10 U/L (ref 0–44)
AST: 16 U/L (ref 15–41)
Albumin: 3.6 g/dL (ref 3.5–5.0)
Alkaline Phosphatase: 114 U/L (ref 38–126)
Anion gap: 5 (ref 5–15)
BUN: 15 mg/dL (ref 8–23)
CO2: 31 mmol/L (ref 22–32)
Calcium: 9.3 mg/dL (ref 8.9–10.3)
Chloride: 102 mmol/L (ref 98–111)
Creatinine: 1 mg/dL (ref 0.61–1.24)
GFR, Estimated: >60 mL/min (ref >60)
Glucose, Bld: 104 mg/dL — ABNORMAL HIGH (ref 70–99)
Potassium: 4.5 mmol/L (ref 3.5–5.1)
Sodium: 138 mmol/L (ref 135–145)
Total Bilirubin: 0.8 mg/dL (ref 0.0–1.2)
Total Protein: 7 g/dL (ref 6.5–8.1)

## 2023-08-31 LAB — TSH: TSH: 0.795 u[IU]/mL (ref 0.350–4.500)

## 2023-08-31 LAB — POCT INR: INR: 3.7 — AB (ref 2.0–3.0)

## 2023-08-31 LAB — IRON AND IRON BINDING CAPACITY (CC-WL,HP ONLY)
Iron: 33 ug/dL — ABNORMAL LOW (ref 45–182)
Saturation Ratios: 15 % — ABNORMAL LOW (ref 17.9–39.5)
TIBC: 227 ug/dL — ABNORMAL LOW (ref 250–450)
UIBC: 194 ug/dL

## 2023-08-31 LAB — FERRITIN: Ferritin: 254 ng/mL (ref 24–336)

## 2023-08-31 NOTE — Progress Notes (Signed)
 INR on 7/11 was 10.3. due to eating grapefruit. Pt denied any bleeding at that time. Pt was prescribed 2.5 mg mephytoin and ordered to hold all warfarin. Cause of supratherapeutic INR is thought to be pt eating grapefruit every morning.  Msg was sent to PCP last week concerning pt labile INR and difficulty remembering apts and taking medication. Pt had apt with PCP to discuss transitioning to Eliquis. Pt has decided to remain on warfarin Pt reports he has not eaten any grapefruit since stopping warfarin on 7/11. Advised he could not eat grapefruit any longer as long as he is taking warfarin. Pt had INR recheck on 7/14 and it was 2.6 and pt was advised to restart warfarin.  Advised if any signs or symptoms of abnormal bruising or bleeding go to the ER. Pt verbalized understanding.  Pt already took warfarin today. Hold dose tomorrow and then change weekly dose to take 1 tablet daily except take 1/2 tablet on Monday. Recheck in one week.

## 2023-08-31 NOTE — Patient Instructions (Addendum)
 Pre visit review using our clinic review tool, if applicable. No additional management support is needed unless otherwise documented below in the visit note.  Hold dose tomorrow and then change weekly dose to take 1 tablet daily except take 1/2 tablet on Monday. Recheck in one week.

## 2023-09-01 ENCOUNTER — Ambulatory Visit: Payer: Self-pay | Admitting: Hematology & Oncology

## 2023-09-01 ENCOUNTER — Encounter: Payer: Self-pay | Admitting: Hematology & Oncology

## 2023-09-01 ENCOUNTER — Encounter: Payer: Self-pay | Admitting: Internal Medicine

## 2023-09-01 DIAGNOSIS — L84 Corns and callosities: Secondary | ICD-10-CM | POA: Insufficient documentation

## 2023-09-01 NOTE — Progress Notes (Signed)
 1 so present along working Hematology and Oncology Follow Up Visit  Clarence Hopkins [id number] 17-Nov-1930 89 y.o. 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 Iron deficiency anemia  Current Therapy:   S/p I-131 therapy on 08/27/2019 Vismodegib  150 mg po q day -- start on 12/01/2021 - on hold since 03/04/2022 Injectafer  750 mg IV - given on 02/24/2022      Interim History:  Clarence Hopkins is back for follow-up.  His wife comes in with him.  She found was able to make it in.  She had hip surgery back in the Winter.  She then had a about shingles.  He is looking pretty frail.  He is not that hungry.  He just does not have much of an appetite.  I am unsure as to what the problem with that might be.  When we did last see him, he did have a CAT scan that was done.  This was done on 07/23/2023.  This did show that he had progression of his pulmonary metastatic disease.  Unfortunately, he had not been taking the Synthroid  I suspect.  His TSH was 75.  The thyroglobulin was up to 413.  Today, his TSH is back down to 0.8.  He does not have any cough.  There is no shortness of breath.  He is always tired.  We did do iron studies on him today.  The iron studies show a ferritin of 254 with an iron saturation of only 15%.  I do think he probably would benefit from some IV iron.  I think the real issue here is whether or not he is going to be able to tolerate any kind of therapy for the thyroid  cancer.  He certainly is not in the best shape.  His performance status is probably ECOG 3.  His 89st birthday is coming up in a few weeks.  I am sure that he is looking forward to this.  We may have to get another CT scan to see everything looks.  Again, I think he is taking his Synthroid .  I think his wife is making sure that he takes the Synthroid .  His eye is doing quite well.  He had a basal cell under the right eye.  He had been on  vismodegib  for this.  This has been off now for a year and a half and there is been no evidence of recurrence.  Currently, I would say his performance as is ECOG 3. .    Medications:  Current Outpatient Medications:    levothyroxine  (SYNTHROID ) 200 MCG tablet, Take 1 tablet (200 mcg total) by mouth daily with 25mcg, Disp: 90 tablet, Rfl: 3   levothyroxine  (SYNTHROID ) 25 MCG tablet, Take 1 tablet (25 mcg total) by mouth daily., Disp: 90 tablet, Rfl: 3   warfarin (COUMADIN ) 5 MG tablet, TAKE 1 TABLET BY MOUTH DAILY OR AS DIRECTED BY ANTICOAGULATION CLINIC., Disp: 100 tablet, Rfl: 1   Cholecalciferol 50 MCG (2000 UT) CAPS, Take 2,000 Units by mouth. (Patient not taking: Reported on 08/31/2023), Disp: , Rfl:    Ibuprofen-diphenhydrAMINE Cit (ADVIL PM PO), Take by mouth at bedtime as needed. (Patient not taking: Reported on 08/31/2023), Disp: , Rfl:    phytonadione  (VITAMIN K) 5 MG tablet, Take 5 mg by mouth once. (Patient not taking: Reported on 08/31/2023), Disp: , Rfl:    Polyethylene Glycol 3350  (MIRALAX  PO), Take by mouth daily. (Patient not taking: Reported  on 08/31/2023), Disp: , Rfl:    Vibegron  (GEMTESA ) 75 MG TABS, Take 75 mg by mouth. (Patient not taking: Reported on 08/31/2023), Disp: , Rfl:   Allergies: No Known Allergies  Past Medical History, Surgical history, Social history, and Family History were reviewed and updated.  Review of Systems: Review of Systems  Constitutional: Negative.   HENT:  Negative.    Eyes: Negative.   Respiratory: Negative.    Cardiovascular: Negative.   Gastrointestinal: Negative.   Endocrine: Negative.   Genitourinary: Negative.    Musculoskeletal: Negative.   Skin: Negative.   Neurological: Negative.   Hematological: Negative.   Psychiatric/Behavioral: Negative.      Physical Exam: His vital signs show a temperature of 98.  Pulse 66.  Blood pressure 98/75.  Weight is 183 pounds.     Wt Readings from Last 3 Encounters:  08/31/23 (P) 183 lb (83  kg)  08/30/23 184 lb (83.5 kg)  06/21/23 191 lb (86.6 kg)    Physical Exam Vitals reviewed.  HENT:     Head: Normocephalic and atraumatic.     Comments: The area under his right eye looks quite good.  I really do not see any obvious lesion.  There may be a little bit of fullness.  He has good extraocular muscle movement.   Eyes:     Pupils: Pupils are equal, round, and reactive to light.  Cardiovascular:     Heart sounds: Normal heart sounds.     Comments: Cardiac exam is slow but regular.  He does have occasional extra beats.  He does have an occasional skipped beat.  He has 1/6 systolic ejection murmur. Pulmonary:     Effort: Pulmonary effort is normal.     Breath sounds: Normal breath sounds.     Comments: His lungs sound clear bilaterally.  He has good air movement bilaterally.  I do not hear any wheezing. Abdominal:     General: Bowel sounds are normal.     Palpations: Abdomen is soft.  Musculoskeletal:        General: No tenderness or deformity. Normal range of motion.     Cervical back: Normal range of motion.  Lymphadenopathy:     Cervical: No cervical adenopathy.  Skin:    General: Skin is warm and dry.     Findings: No erythema or rash.  Neurological:     Mental Status: He is alert and oriented to person, place, and time.  Psychiatric:        Behavior: Behavior normal.        Thought Content: Thought content normal.        Judgment: Judgment normal.      Lab Results  Component Value Date   WBC 7.0 08/31/2023   HGB 11.8 (L) 08/31/2023   HCT 37.2 (L) 08/31/2023   MCV 89.4 08/31/2023   PLT 282 08/31/2023     Chemistry      Component Value Date/Time   NA 138 08/31/2023 1502   NA 142 11/29/2020 0000   K 4.5 08/31/2023 1502   CL 102 08/31/2023 1502   CO2 31 08/31/2023 1502   BUN 15 08/31/2023 1502   BUN 19 11/29/2020 0000   CREATININE 1.00 08/31/2023 1502   GLU 95 11/29/2020 0000      Component Value Date/Time   CALCIUM 9.3 08/31/2023 1502   ALKPHOS  114 08/31/2023 1502   AST 16 08/31/2023 1502   ALT 10 08/31/2023 1502   BILITOT 0.8 08/31/2023 1502  Impression and Plan: Clarence Hopkins is a very nice 89 year old white male.  He has metastatic thyroid  cancer.  This is a differentiated thyroid  cancer.  Thankfully it has been responsive to the radioactive iodine.   Hopefully, him taking his Synthroid  will help with the thyroid  cancer again.  We really need to see how his thyroid  cancer looks.  I will have to set him up with a CT scan.  Will see back in the CT scan in about a month.  Hopefully, we can get his performance status a bit better.  I will have to call in some Megace to see if this may help with his appetite.  We will have to get him back for some iron.  I think this may help.  Thankfully, there is no problems with the basal cell carcinoma to the right eye.  I will have to speak with his Endocrinologist and let him know what is going on.  We really need to see what his thyroglobulin level is going to be.   Maude JONELLE Crease, MD 7/19/202511:39 AM

## 2023-09-01 NOTE — Assessment & Plan Note (Signed)
 Right foot very deep and painful callus-see procedure

## 2023-09-03 ENCOUNTER — Encounter: Payer: Self-pay | Admitting: Hematology & Oncology

## 2023-09-03 ENCOUNTER — Other Ambulatory Visit: Payer: Self-pay | Admitting: Medical Oncology

## 2023-09-03 NOTE — Telephone Encounter (Signed)
-----   Message from Maude JONELLE Crease sent at 09/01/2023  6:36 AM EDT ----- Please call and let him know that the iron level is on the low side.  He really needs to come in for IV iron.  Please set this up.  Also let him know that the thyroid  is back to where it needs to be.   Keep taking his Synthroid . ----- Message ----- From: Rebecka, [facility] Sent: 08/31/2023   3:36 PM EDT To: Maude JONELLE Crease, MD

## 2023-09-03 NOTE — Telephone Encounter (Signed)
 Called patient and relayed message from Dr. Timmy as stated below. Patient verbalized understanding to continue taking synthroid  as prescribed and is in agreement with plan for IV iron. I let patient know we would work on getting that set up and our scheduling team would reach out with appointment details. Patient verbalized understanding.

## 2023-09-05 ENCOUNTER — Other Ambulatory Visit: Payer: Self-pay | Admitting: *Deleted

## 2023-09-05 DIAGNOSIS — H0279 Other degenerative disorders of eyelid and periocular area: Secondary | ICD-10-CM | POA: Diagnosis not present

## 2023-09-05 DIAGNOSIS — L570 Actinic keratosis: Secondary | ICD-10-CM | POA: Diagnosis not present

## 2023-09-05 DIAGNOSIS — Z961 Presence of intraocular lens: Secondary | ICD-10-CM | POA: Diagnosis not present

## 2023-09-05 DIAGNOSIS — C73 Malignant neoplasm of thyroid gland: Secondary | ICD-10-CM

## 2023-09-05 DIAGNOSIS — C441122 Basal cell carcinoma of skin of right lower eyelid, including canthus: Secondary | ICD-10-CM | POA: Diagnosis not present

## 2023-09-05 DIAGNOSIS — H16211 Exposure keratoconjunctivitis, right eye: Secondary | ICD-10-CM | POA: Diagnosis not present

## 2023-09-05 DIAGNOSIS — H02532 Eyelid retraction right lower eyelid: Secondary | ICD-10-CM | POA: Diagnosis not present

## 2023-09-05 DIAGNOSIS — Z7901 Long term (current) use of anticoagulants: Secondary | ICD-10-CM

## 2023-09-06 ENCOUNTER — Ambulatory Visit (HOSPITAL_BASED_OUTPATIENT_CLINIC_OR_DEPARTMENT_OTHER)
Admission: RE | Admit: 2023-09-06 | Discharge: 2023-09-06 | Disposition: A | Discharge status: Home / Self Care | Source: Ambulatory Visit | Attending: Hematology & Oncology | Admitting: Hematology & Oncology

## 2023-09-06 ENCOUNTER — Inpatient Hospital Stay

## 2023-09-06 ENCOUNTER — Encounter (HOSPITAL_BASED_OUTPATIENT_CLINIC_OR_DEPARTMENT_OTHER): Payer: Self-pay

## 2023-09-06 ENCOUNTER — Telehealth: Payer: Self-pay | Admitting: Dietician

## 2023-09-06 VITALS — BP 132/76 | HR 63 | Temp 97.9°F | Resp 18

## 2023-09-06 DIAGNOSIS — D509 Iron deficiency anemia, unspecified: Secondary | ICD-10-CM | POA: Diagnosis not present

## 2023-09-06 DIAGNOSIS — D500 Iron deficiency anemia secondary to blood loss (chronic): Secondary | ICD-10-CM

## 2023-09-06 DIAGNOSIS — C7802 Secondary malignant neoplasm of left lung: Secondary | ICD-10-CM | POA: Diagnosis not present

## 2023-09-06 DIAGNOSIS — Z7901 Long term (current) use of anticoagulants: Secondary | ICD-10-CM

## 2023-09-06 DIAGNOSIS — C73 Malignant neoplasm of thyroid gland: Secondary | ICD-10-CM | POA: Diagnosis not present

## 2023-09-06 DIAGNOSIS — I700 Atherosclerosis of aorta: Secondary | ICD-10-CM | POA: Diagnosis not present

## 2023-09-06 DIAGNOSIS — C7801 Secondary malignant neoplasm of right lung: Secondary | ICD-10-CM | POA: Diagnosis not present

## 2023-09-06 DIAGNOSIS — C7800 Secondary malignant neoplasm of unspecified lung: Secondary | ICD-10-CM | POA: Diagnosis not present

## 2023-09-06 DIAGNOSIS — Z79890 Hormone replacement therapy (postmenopausal): Secondary | ICD-10-CM | POA: Diagnosis not present

## 2023-09-06 DIAGNOSIS — C4481 Basal cell carcinoma of overlapping sites of skin: Secondary | ICD-10-CM | POA: Diagnosis not present

## 2023-09-06 LAB — CBC WITH DIFFERENTIAL (CANCER CENTER ONLY)
Abs Immature Granulocytes: 0.01 K/uL (ref 0.00–0.07)
Basophils Absolute: 0 K/uL (ref 0.0–0.1)
Basophils Relative: 1 %
Eosinophils Absolute: 0.1 K/uL (ref 0.0–0.5)
Eosinophils Relative: 2 %
HCT: 35.9 % — ABNORMAL LOW (ref 39.0–52.0)
Hemoglobin: 11.2 g/dL — ABNORMAL LOW (ref 13.0–17.0)
Immature Granulocytes: 0 %
Lymphocytes Relative: 28 %
Lymphs Abs: 1.5 K/uL (ref 0.7–4.0)
MCH: 28.1 pg (ref 26.0–34.0)
MCHC: 31.2 g/dL (ref 30.0–36.0)
MCV: 90.2 fL (ref 80.0–100.0)
Monocytes Absolute: 0.6 K/uL (ref 0.1–1.0)
Monocytes Relative: 11 %
Neutro Abs: 3.2 K/uL (ref 1.7–7.7)
Neutrophils Relative %: 58 %
Platelet Count: 237 K/uL (ref 150–400)
RBC: 3.98 MIL/uL — ABNORMAL LOW (ref 4.22–5.81)
RDW: 12.7 % (ref 11.5–15.5)
WBC Count: 5.5 K/uL (ref 4.0–10.5)
nRBC: 0 % (ref 0.0–0.2)

## 2023-09-06 LAB — COMPREHENSIVE METABOLIC PANEL WITH GFR
ALT: 13 U/L (ref 0–44)
AST: 22 U/L (ref 15–41)
Albumin: 3.5 g/dL (ref 3.5–5.0)
Alkaline Phosphatase: 132 U/L — ABNORMAL HIGH (ref 38–126)
Anion gap: 7 (ref 5–15)
BUN: 13 mg/dL (ref 8–23)
CO2: 28 mmol/L (ref 22–32)
Calcium: 8.7 mg/dL — ABNORMAL LOW (ref 8.9–10.3)
Chloride: 102 mmol/L (ref 98–111)
Creatinine, Ser: 1.03 mg/dL (ref 0.61–1.24)
GFR, Estimated: >60 mL/min (ref >60)
Glucose, Bld: 83 mg/dL (ref 70–99)
Potassium: 4.9 mmol/L (ref 3.5–5.1)
Sodium: 137 mmol/L (ref 135–145)
Total Bilirubin: 0.6 mg/dL (ref 0.0–1.2)
Total Protein: 6.8 g/dL (ref 6.5–8.1)

## 2023-09-06 LAB — PROTIME-INR
INR: 3.4 — ABNORMAL HIGH (ref 0.8–1.2)
Prothrombin Time: 35.5 s — ABNORMAL HIGH (ref 11.4–15.2)

## 2023-09-06 MED ORDER — IOHEXOL 300 MG/ML  SOLN
100.0000 mL | Freq: Once | INTRAMUSCULAR | Status: AC | PRN
  Administered 2023-09-06: 75 mL via INTRAVENOUS

## 2023-09-06 MED ORDER — SODIUM CHLORIDE 0.9 % IV SOLN
1000.0000 mg | Freq: Once | INTRAVENOUS | Status: AC
  Administered 2023-09-06: 1000 mg via INTRAVENOUS
  Filled 2023-09-06: qty 10

## 2023-09-06 MED ORDER — SODIUM CHLORIDE 0.9 % IV SOLN: INTRAVENOUS | Status: DC

## 2023-09-06 NOTE — Telephone Encounter (Signed)
 Called to schedule nutrition appt per inbasket. LVM to return call for scheduling.

## 2023-09-06 NOTE — Patient Instructions (Signed)

## 2023-09-07 ENCOUNTER — Ambulatory Visit

## 2023-09-07 ENCOUNTER — Telehealth: Payer: Self-pay

## 2023-09-07 DIAGNOSIS — Z7901 Long term (current) use of anticoagulants: Secondary | ICD-10-CM

## 2023-09-07 LAB — POCT INR: INR: 4.4 — AB (ref 2.0–3.0)

## 2023-09-07 NOTE — Progress Notes (Signed)
 Pt denies any changes. Pt does report he has no appetite anymore but this has been a chronic problem for some time now. This may explain why INR has been erratic. INR was checked yesterday at an OV and INR was 3.4. Advised if any s/s of bleeding or abnormal bruising go to ER. Pt verbalized understanding. Pt denies any s/s of bleeding now. Pt already took warfarin today. Hold dose tomorrow and reduce dose the day after tomorrow to take 1/2 tablet and then change weekly dose to take 1 tablet daily except take 1/2 tablet on Monday, Wednesday and Friday. Recheck in one week.

## 2023-09-07 NOTE — Telephone Encounter (Signed)
 Pt's daughter, Carlyon, called in requesting to know what the pt received when he was in the office yesterday. Carlyon was not listed on pt's ROI/DPR. Informed Paige that per HIPAA rules I am not allowed to relay any pt information at this time. Advised her to have the pt complete an ROI/DPR form to include her and she stated she did not know when his next appointment was. Advised her they could come in anytime to do this and she stated that she did not want to bring him to the office unless it was for an appointment. Carlyon states that she has spoke to Dr Timmy before regarding her dads medical information and I advised her we would still need the ROI/DPR form completed. Paige requested that I relay this to Dr Timmy and see if he will call. Discussed with Dr Timmy and he agreed and would not be contacting her but does recommend having her added to his ROI/DPR.

## 2023-09-07 NOTE — Patient Instructions (Addendum)
 Pre visit review using our clinic review tool, if applicable. No additional management support is needed unless otherwise documented below in the visit note.  Hold dose tomorrow and reduce dose the day after tomorrow to take 1/2 tablet and then change weekly dose to take 1 tablet daily except take 1/2 tablet on Monday, Wednesday and Friday. Recheck in one week. If you have any signs and symptoms of abnormal bruising or bleeding go to the emergency room.

## 2023-09-10 ENCOUNTER — Ambulatory Visit: Payer: Self-pay | Admitting: Hematology & Oncology

## 2023-09-10 NOTE — Telephone Encounter (Signed)
 RN attempted to contact patient again to review stable CT Chest results. Call answered but disconnected.

## 2023-09-10 NOTE — Telephone Encounter (Signed)
 Attempted to contact patient to relay CT Chest results note from Dr. Timmy. Unable to reach patient or leave VM. RN will continue to attempt patient with this information.

## 2023-09-10 NOTE — Telephone Encounter (Signed)
-----   Message from Maude JONELLE Crease sent at 09/10/2023  6:40 AM EDT ----- Call - the CT scan looks stable!!! This si good news!!  Jeralyn ----- Message ----- From: Interface, Rad Results In Sent: 09/07/2023   3:41 PM EDT To: Maude JONELLE Crease, MD

## 2023-09-11 ENCOUNTER — Telehealth: Payer: Self-pay | Admitting: Dietician

## 2023-09-11 NOTE — Telephone Encounter (Signed)
 Patient screened on MST. First attempt to reach. Provided my cell# on voice mail to return call to set up a nutrition consult.  Gennaro Africa, RDN, LDN Registered Dietitian, [facility] Part Time Remote (Usual office hours: Tuesday-Thursday) Cell: [phone]

## 2023-09-14 ENCOUNTER — Ambulatory Visit

## 2023-09-14 ENCOUNTER — Telehealth: Payer: Self-pay | Admitting: Dietician

## 2023-09-14 ENCOUNTER — Telehealth: Payer: Self-pay

## 2023-09-14 NOTE — Telephone Encounter (Signed)
 Called to schedule nutrition appt per inbasket. LVM to return call for scheduling.

## 2023-09-14 NOTE — Telephone Encounter (Signed)
 Pt reports he is not feeling well today and needs to RS coumadin  clinic apt. Inquired if pt needed an OV. Pt declined an OV.  RS to 8/5. Pt verbalized understanding.

## 2023-09-18 ENCOUNTER — Ambulatory Visit (INDEPENDENT_AMBULATORY_CARE_PROVIDER_SITE_OTHER)

## 2023-09-18 DIAGNOSIS — Z7901 Long term (current) use of anticoagulants: Secondary | ICD-10-CM | POA: Diagnosis not present

## 2023-09-18 LAB — POCT INR: INR: 4.7 — AB (ref 2.0–3.0)

## 2023-09-18 NOTE — Patient Instructions (Addendum)
 Pre visit review using our clinic review tool, if applicable. No additional management support is needed unless otherwise documented below in the visit note.  Hold dose tomorrow and hold dose the day after tomorrow and then change weekly dose to take 1/2 tablet daily except take 1 tablet on Tuesday and Saturday. Recheck in one week.

## 2023-09-18 NOTE — Progress Notes (Addendum)
 Pt denies any changes. Pt does report he has no appetite anymore but this has been a chronic problem for some time now. This may explain why INR has been erratic. Advised if any s/s of bleeding or abnormal bruising go to ER. Pt verbalized understanding. Pt denies any s/s of bleeding now. He also reported he is not taking his levothyroxine . Advised pt the importance of taking thyroid  medication and warfarin as prescribed.  Pt already took warfarin today. Hold dose tomorrow and hold dose the day after tomorrow and then change weekly dose to take 1/2 tablet daily except take 1 tablet on Tuesday and Saturday. Recheck in one week.  Medical screening examination/treatment/procedure(s) were performed by non-physician practitioner and as supervising physician I was immediately available for consultation/collaboration.  I agree with above. Karlynn Noel, MD

## 2023-09-25 ENCOUNTER — Ambulatory Visit (INDEPENDENT_AMBULATORY_CARE_PROVIDER_SITE_OTHER)

## 2023-09-25 DIAGNOSIS — Z7901 Long term (current) use of anticoagulants: Secondary | ICD-10-CM

## 2023-09-25 LAB — POCT INR: INR: 2.7 (ref 2.0–3.0)

## 2023-09-25 NOTE — Progress Notes (Addendum)
 Pt denies any changes. Pt does report he has no appetite anymore but this has been a chronic problem for some time now. This may explain why INR has been erratic. Advised if any s/s of bleeding or abnormal bruising go to ER. Pt verbalized understanding. Pt denies any s/s of bleeding now. He also reported he is not taking his levothyroxine . Advised pt the importance of taking thyroid  medication and warfarin as prescribed.  Pt brought his AVS from his last coumadin  clinic apt, per request from his wife, to show he had each day marked that he took the warfarin. Pt took all as prescribed. Continue 1/2 tablet daily except take 1 tablet on Tuesday and Saturday. Recheck in 2 week.  Medical screening examination/treatment/procedure(s) were performed by non-physician practitioner and as supervising physician I was immediately available for consultation/collaboration.  I agree with above. Karlynn Noel, MD

## 2023-09-25 NOTE — Patient Instructions (Addendum)
 Pre visit review using our clinic review tool, if applicable. No additional management support is needed unless otherwise documented below in the visit note.  Continue 1/2 tablet daily except take 1 tablet on Tuesday and Saturday. Recheck in 2 week.

## 2023-09-26 ENCOUNTER — Encounter (HOSPITAL_COMMUNITY): Payer: Self-pay

## 2023-09-26 ENCOUNTER — Emergency Department (HOSPITAL_COMMUNITY)

## 2023-09-26 ENCOUNTER — Other Ambulatory Visit: Payer: Self-pay

## 2023-09-26 ENCOUNTER — Emergency Department (HOSPITAL_COMMUNITY)
Admission: EM | Admit: 2023-09-26 | Discharge: 2023-09-26 | Disposition: A | Discharge status: Home / Self Care | Source: Skilled Nursing Facility | Attending: Emergency Medicine | Admitting: Emergency Medicine

## 2023-09-26 DIAGNOSIS — Z85850 Personal history of malignant neoplasm of thyroid: Secondary | ICD-10-CM | POA: Insufficient documentation

## 2023-09-26 DIAGNOSIS — N1330 Unspecified hydronephrosis: Secondary | ICD-10-CM

## 2023-09-26 DIAGNOSIS — R339 Retention of urine, unspecified: Secondary | ICD-10-CM | POA: Diagnosis not present

## 2023-09-26 DIAGNOSIS — R7989 Other specified abnormal findings of blood chemistry: Secondary | ICD-10-CM | POA: Diagnosis not present

## 2023-09-26 DIAGNOSIS — Z7901 Long term (current) use of anticoagulants: Secondary | ICD-10-CM | POA: Insufficient documentation

## 2023-09-26 DIAGNOSIS — Z85828 Personal history of other malignant neoplasm of skin: Secondary | ICD-10-CM | POA: Diagnosis not present

## 2023-09-26 DIAGNOSIS — R9431 Abnormal electrocardiogram [ECG] [EKG]: Secondary | ICD-10-CM | POA: Diagnosis not present

## 2023-09-26 DIAGNOSIS — N179 Acute kidney failure, unspecified: Secondary | ICD-10-CM | POA: Insufficient documentation

## 2023-09-26 DIAGNOSIS — N132 Hydronephrosis with renal and ureteral calculous obstruction: Secondary | ICD-10-CM | POA: Diagnosis not present

## 2023-09-26 DIAGNOSIS — R1030 Lower abdominal pain, unspecified: Secondary | ICD-10-CM | POA: Diagnosis not present

## 2023-09-26 DIAGNOSIS — R1084 Generalized abdominal pain: Secondary | ICD-10-CM | POA: Diagnosis not present

## 2023-09-26 DIAGNOSIS — R338 Other retention of urine: Secondary | ICD-10-CM

## 2023-09-26 DIAGNOSIS — K7689 Other specified diseases of liver: Secondary | ICD-10-CM | POA: Diagnosis not present

## 2023-09-26 DIAGNOSIS — R531 Weakness: Secondary | ICD-10-CM | POA: Diagnosis not present

## 2023-09-26 LAB — CBC
HCT: 42.5 % (ref 39.0–52.0)
Hemoglobin: 13.1 g/dL (ref 13.0–17.0)
MCH: 28.1 pg (ref 26.0–34.0)
MCHC: 30.8 g/dL (ref 30.0–36.0)
MCV: 91.2 fL (ref 80.0–100.0)
Platelets: 253 K/uL (ref 150–400)
RBC: 4.66 MIL/uL (ref 4.22–5.81)
RDW: 13.3 % (ref 11.5–15.5)
WBC: 7.4 K/uL (ref 4.0–10.5)
nRBC: 0 % (ref 0.0–0.2)

## 2023-09-26 LAB — COMPREHENSIVE METABOLIC PANEL WITH GFR
ALT: 15 U/L (ref 0–44)
AST: 20 U/L (ref 15–41)
Albumin: 3.5 g/dL (ref 3.5–5.0)
Alkaline Phosphatase: 143 U/L — ABNORMAL HIGH (ref 38–126)
Anion gap: 8 (ref 5–15)
BUN: 23 mg/dL (ref 8–23)
CO2: 26 mmol/L (ref 22–32)
Calcium: 8.7 mg/dL — ABNORMAL LOW (ref 8.9–10.3)
Chloride: 101 mmol/L (ref 98–111)
Creatinine, Ser: 1.82 mg/dL — ABNORMAL HIGH (ref 0.61–1.24)
GFR, Estimated: 35 mL/min — ABNORMAL LOW (ref >60)
Glucose, Bld: 89 mg/dL (ref 70–99)
Potassium: 4.7 mmol/L (ref 3.5–5.1)
Sodium: 135 mmol/L (ref 135–145)
Total Bilirubin: 1.1 mg/dL (ref 0.0–1.2)
Total Protein: 7.8 g/dL (ref 6.5–8.1)

## 2023-09-26 LAB — URINALYSIS, ROUTINE W REFLEX MICROSCOPIC
Bilirubin Urine: NEGATIVE
Glucose, UA: NEGATIVE mg/dL
Hgb urine dipstick: NEGATIVE
Ketones, ur: NEGATIVE mg/dL
Leukocytes,Ua: NEGATIVE
Nitrite: NEGATIVE
Protein, ur: NEGATIVE mg/dL
Specific Gravity, Urine: 1.009 (ref 1.005–1.030)
pH: 6 (ref 5.0–8.0)

## 2023-09-26 LAB — TROPONIN I (HIGH SENSITIVITY)
Troponin I (High Sensitivity): 28 ng/L — ABNORMAL HIGH (ref <18)
Troponin I (High Sensitivity): 29 ng/L — ABNORMAL HIGH (ref <18)

## 2023-09-26 LAB — LIPASE, BLOOD: Lipase: 35 U/L (ref 11–51)

## 2023-09-26 MED ORDER — FENTANYL CITRATE PF 50 MCG/ML IJ SOSY
25.0000 ug | PREFILLED_SYRINGE | Freq: Once | INTRAMUSCULAR | Status: AC
  Administered 2023-09-26 (×2): 25 ug via INTRAVENOUS
  Filled 2023-09-26: qty 1

## 2023-09-26 NOTE — Group Note (Deleted)
 Date:  09/26/2023 Time:  2:21 PM  Group Topic/Focus:  Wellness Toolbox:   The focus of this group is to discuss various aspects of wellness, balancing those aspects and exploring ways to increase the ability to experience wellness.  Patients will create a wellness toolbox for use upon discharge.     Participation Level:  {BHH PARTICIPATION OZCZO:77735}  Participation Quality:  {BHH PARTICIPATION QUALITY:22265}  Affect:  {BHH AFFECT:22266}  Cognitive:  {BHH COGNITIVE:22267}  Insight: {BHH Insight2:20797}  Engagement in Group:  {BHH ENGAGEMENT IN HMNLE:77731}  Modes of Intervention:  {BHH MODES OF INTERVENTION:22269}  Additional Comments:  ***  Myra Curtistine BROCKS 09/26/2023, 2:21 PM

## 2023-09-26 NOTE — ED Provider Notes (Signed)
 [place] [facility] AT [facility] Provider Note   CSN: [id number] Arrival date & time: 09/26/23  1120     Patient presents with: Abdominal Pain   Clarence Hopkins is a 89 y.o. 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  Reports he has been having to use the bathroom very frequently, but not much comes out.  He denies any dysuria, hematuria.  Denies any nausea or vomiting, fever or chills.  Denies any flank pain.    Abdominal Pain      Prior to Admission medications   Medication Sig Start Date End Date Taking? Authorizing Provider  Cholecalciferol 50 MCG (2000 UT) CAPS Take 2,000 Units by mouth. Patient not taking: Reported on 08/31/2023 02/23/21   [provider]  Ibuprofen-diphenhydrAMINE Cit (ADVIL PM PO) Take by mouth at bedtime as needed. Patient not taking: Reported on 08/31/2023    [provider]  levothyroxine  (SYNTHROID ) 200 MCG tablet Take 1 tablet (200 mcg total) by mouth daily with 25mcg 08/15/22   Beryl Donnice BRAVO, MD  levothyroxine  (SYNTHROID ) 25 MCG tablet Take 1 tablet (25 mcg total) by mouth daily. 08/15/22     phytonadione  (VITAMIN K) 5 MG tablet Take 5 mg by mouth once. Patient not taking: Reported on 08/31/2023 08/24/23   [provider]  Polyethylene Glycol 3350  (MIRALAX  PO) Take by mouth daily. Patient not taking: Reported on 08/31/2023    [provider]  Vibegron  (GEMTESA ) 75 MG TABS Take 75 mg by mouth. Patient not taking: Reported on 08/31/2023 12/14/20   [provider]  warfarin (COUMADIN ) 5 MG tablet TAKE 1 TABLET BY MOUTH DAILY OR AS DIRECTED BY ANTICOAGULATION CLINIC. 07/19/23   Plotnikov, Karlynn GAILS, MD    Allergies: Patient has no known allergies.    Review of Systems  Gastrointestinal:  Positive for abdominal  pain.    Updated Vital Signs BP (!) 144/86   Pulse 79   Temp 98.3 F (36.8 C) (Oral)   Resp 19   Ht 6' 3 (1.905 m)   Wt 83.9 kg   SpO2 95%   BMI 23.12 kg/m   Physical Exam Vitals and nursing note reviewed.  Constitutional:      General: He is not in acute distress.    Appearance: He is well-developed.  HENT:     Head: Normocephalic and atraumatic.  Eyes:     Conjunctiva/sclera: Conjunctivae normal.  Cardiovascular:     Rate and Rhythm: Normal rate and regular rhythm.     Heart sounds: No murmur heard. Pulmonary:     Effort: Pulmonary effort is normal. No respiratory distress.     Breath sounds: Normal breath sounds.  Abdominal:     Palpations: Abdomen is soft.     Tenderness: There is abdominal tenderness.     Comments: Tender to palpation in the left upper quadrant and lower abdomen diffusely.  There is some mild guarding but no rebound  Once Foley was placed, of urine output was obtained  Musculoskeletal:        General: No swelling.     Cervical back: Neck supple.  Skin:    General: Skin is warm and dry.     Capillary Refill: Capillary refill takes less than 2 seconds.  Neurological:     Mental Status: He is alert.  Psychiatric:  Mood and Affect: Mood normal.     (all labs ordered are listed, but only abnormal results are displayed) Labs Reviewed  COMPREHENSIVE METABOLIC PANEL WITH GFR - Abnormal; Notable for the following components:      Result Value   Creatinine, Ser 1.82 (*)    Calcium 8.7 (*)    Alkaline Phosphatase 143 (*)    GFR, Estimated 35 (*)    All other components within normal limits  TROPONIN I (HIGH SENSITIVITY) - Abnormal; Notable for the following components:   Troponin I (High Sensitivity) 28 (*)    All other components within normal limits  LIPASE, BLOOD  CBC  URINALYSIS, ROUTINE W REFLEX MICROSCOPIC  TROPONIN I (HIGH SENSITIVITY)    EKG: EKG Interpretation Date/Time:  Wednesday September 26 2023 19:41:22  EDT Ventricular Rate:  81 PR Interval:  231 QRS Duration:  89 QT Interval:  420 QTC Calculation: 488 R Axis:   72  Text Interpretation: Sinus or ectopic atrial rhythm Atrial premature complex Borderline prolonged QT interval Sinus, PACs Confirmed by Bari Flank [phone]) on 09/26/2023 9:25:20 PM  Radiology: CT ABDOMEN PELVIS WO CONTRAST Result Date: 09/26/2023 CLINICAL DATA:  Abdominal pain, dysuria, history of melanoma, history of thyroid  cancer, pulmonary metastasis. EXAM: CT ABDOMEN AND PELVIS WITHOUT CONTRAST TECHNIQUE: Multidetector CT imaging of the abdomen and pelvis was performed following the standard protocol without IV contrast. RADIATION DOSE REDUCTION: This exam was performed according to the departmental dose-optimization program which includes automated exposure control, adjustment of the mA and/or kV according to patient size and/or use of iterative reconstruction technique. COMPARISON:  Chest CT September 06, 2023 FINDINGS: Evaluation is suboptimal given the lack of contrast. Lower chest: Bilateral pulmonary nodules and masses up to 5.7 cm, stable to recent chest CT and consistent with known metastasis. Some of the lesions demonstrate punctate calcifications. Hepatobiliary: Scattered liver lesions likely representing cysts, stable to prior measuring up to 3.7 cm. Normal gallbladder. Pancreas: Atrophic changes of the pancreas. Spleen: Normal in size without focal abnormality. Adrenals/Urinary Tract: Bilateral simple renal cortical cysts measuring up to 12.6 cm which does not require imaging follow-up, gradually enlarging to prior exams from 2022 and stable to recent chest CT. Right kidney interpolar nonobstructing nephrolithiasis measuring 4 mm. No hydronephrosis on the right. Additional punctate nephrolithiasis measuring 2 mm in right kidney interpolar region. There is mild hydroureteronephrosis on the left without definite ureteral stone.Bladder is under distended and thick wall containing  Foley catheter. Questionable mucosal lesion at the left UVJ incompletely assessed on current noncontrast CT. (Image 8/64, 2/76). There is a focus of air within the bladder lumen likely due to recent catheterization. Stomach/Bowel: Stomach and bowel loops appear unremarkable. No bowel obstruction. Normal appendix. Vascular/Lymphatic: Severe atherosclerotic calcifications of aorta and branches. Numerous para-aortic and aortocaval subcentimeter and pericentimeter lymph nodes measuring up to 1 cm in short axis. Reproductive: Prostatomegaly protruding into bladder base. Bilateral hydroceles. Other: Small fat containing bilateral fat containing inguinal hernias. No significant ascites. Evaluation Musculoskeletal: Fat attenuation right pectineus muscle lesion likely lipoma measuring 3.3 x 5.5 cm. Right gluteal muscle lipoma measuring 2.8 x 3.9 cm. Left femoral neck sclerotic lesion likely bone island. IMPRESSION: Left-sided hydroureteronephrosis with suggestion of a mucosal lesion along the left bladder wall at the UVJ. Evaluation is suboptimal given the inadequate distension of the bladder and lack of contrast . Recommend further assessment with cystoscopy. Nonobstructive nephrolithiasis of the right kidney. Numerous hepatic and liver cysts are stable to prior chest CT and enlarged to prior CT  abdomen from 2022. Multiple pulmonary nodules and masses consistent with known metastasis, grossly similar to recent chest CT. Multiple bilateral subcentimeter and pericentimeter retroperitoneal lymph nodes, reactive versus metastatic. Severe prostatomegaly. Additional ancillary findings as above. Electronically Signed   By: Megan  Zare M.D.   On: 09/26/2023 20:18     Procedures   Medications Ordered in the ED  fentaNYL  (SUBLIMAZE ) injection 25 mcg (25 mcg Intravenous Given 09/26/23 1642)    Clinical Course as of 09/26/23 2221  Wed Sep 26, 2023  1658 I was informed by the paramedic that bladder scan was over 800 mL.  I  ordered Foley for patient.  [AF]  1835 Foley catheter was placed, 1000 mL of output obtained [AF]  1952 Patient reports some left-sided chest pain.  Feels this may have been due to transferring back-and-forth from CT.  Will add on EKG and troponin [AF]  2149 Patient reevaluated, not currently having any chest pain.  Will still wait on repeat troponin due to mildly elevated initial troponin [AF]    Clinical Course User Index [AF] Veta Palma, PA-C                                 Medical Decision Making Amount and/or Complexity of Data Reviewed Radiology: ordered.  Risk Prescription drug management.     Differential diagnosis includes but is not limited to acute urinary retention, metastases, acute cholecystitis, cholelithiasis, cholangitis, choledocholithiasis, peptic ulcer, gastritis, gastroenteritis, appendicitis, IBS, IBD, DKA, nephrolithiasis, UTI, pyelonephritis, pancreatitis, diverticulitis, mesenteric ischemia, abdominal aortic aneurysm, small bowel obstruction, volvulus   ED Course:  Upon initial evaluation, patient is uncomfortable appearing, but no acute distress.  He does have abdominal tenderness diffusely in the lower abdomen as well as the left upper quadrant.  Mild guarding but no rebound.  No active vomiting.  He is going frequently and forth to the restroom.  Upon bladder scan initially, over 800 mL was noted, so I ordered Foley.  Once Foley was placed, of urinary output was obtained.  This provided patient significant relief of his lower abdominal pain.  This is consistent with acute urinary retention, suspect this was the cause of his abdominal pain.  Labs Ordered: I Ordered, and personally interpreted labs.  The pertinent results include:   Urinalysis without signs of infection CBC within normal limits CMP with elevated creatinine 1.82, this is up from his baseline of 1.03 Lipase within normal limits at 35 Troponin elevated at 28, repeat  pending  Imaging Studies ordered: I ordered imaging studies including CT abdomen pelvis I independently visualized the imaging with scope of interpretation limited to determining acute life threatening conditions related to emergency care. Imaging showed   IMPRESSION: Left-sided hydroureteronephrosis with suggestion of a mucosal lesion along the left bladder wall at the UVJ. Evaluation is suboptimal given the inadequate distension of the bladder and lack of contrast . Recommend further assessment with cystoscopy.   Nonobstructive nephrolithiasis of the right kidney.   Numerous hepatic and liver cysts are stable to prior chest CT and enlarged to prior CT abdomen from 2022.   Multiple pulmonary nodules and masses consistent with known metastasis, grossly similar to recent chest CT.   Multiple bilateral subcentimeter and pericentimeter retroperitoneal lymph nodes, reactive versus metastatic.   Severe prostatomegaly.   Additional ancillary findings as above. I agree with the radiologist interpretation   Cardiac Monitoring: / EKG: The patient was maintained on a cardiac monitor.  I personally viewed and interpreted the cardiac monitored which showed an underlying rhythm of: Normal sinus rhythm with occasional PACs   Consultations Obtained: I requested consultation with the urology Dr. Renda,  and discussed lab and imaging findings as well as pertinent plan - they recommend: Recommends patient follow-up with him in office for further management of his acute urinary retention and cystoscopy for the bladder finding noted on CT.  He asked that patient call his office to schedule an appointment.  Medications Given: Fentanyl   Upon re-evaluation, patient reports resolution of his abdominal pain with insertion of Foley.  Suspect acute urinary retention.  He was found to have an elevated creatinine 1.82 which is consistent with AKI.  Suspect this is likely due to prerenal causes from the  urinary retention.  However, there is also finding of left-sided hydroureteronephrosis with a possible mucosal lesion along the left bladder wall at the UVJ.  Urology Dr. Renda was consulted and recommends outpatient follow-up with him in office.   I was also notified by nursing that he reported some chest pain after his CT scan.  We obtained EKG which does not show any ST elevations.  EKG with normal sinus rhythm and occasional PACs.  Initial troponin was elevated at 28, awaiting repeat.  10 PM.  Upon reevaluation, patient reports he has not had any chest pain since earlier this evening.  Still waiting repeat troponin.    Impression: Acute urinary retention AKI  Disposition:  Care of this patient signed out to oncoming ED provider Sonny Mclean, PA-C to follow-up on second troponin.  If troponin remains stable, feel he is likely appropriate for discharge home. Disposition and treatment plan pending imaging results and clinical judgment of oncoming ED team.      Record Review: External records from outside source obtained and reviewed including culture note from 08/31/2023 for his thyroid  cancer and metastases to the lungs     This chart was dictated using voice recognition software, Dragon. Despite the best efforts of this provider to proofread and correct errors, errors may still occur which can change documentation meaning.       Final diagnoses:  Acute urinary retention  Hydroureteronephrosis  AKI (acute kidney injury) [facility])    ED Discharge Orders     None          Veta Palma, PA-C 09/26/23 2221    Bari Roxie HERO, DO 09/27/23 0020

## 2023-09-26 NOTE — ED Notes (Addendum)
 Performed a bladder scan on pt it was 862 ml

## 2023-09-26 NOTE — ED Triage Notes (Signed)
 Pt presents to ED from [facility] c/o lower abdominal pain, dysuria X 2-3 days.

## 2023-09-26 NOTE — ED Provider Notes (Signed)
 Patient's care assumed by me at 10:00 PM.  Patient is pending second troponin.  Patient's first troponin was 28.  Patient presented to the emergency department with acute urinary retention.  Patient reports relief after Foley catheter.  800 cc of urine from bladder.  Patient advised to follow-up with Dr. Renda for recheck.  Patient discharged in stable condition   Velton Roselle K, PA-C 09/26/23 2333    Geraldene Hamilton, MD 09/27/23 [phone]

## 2023-09-26 NOTE — Discharge Instructions (Addendum)
 You were found to have acute urinary retention which is where the bladder is not draining properly and it leads to a buildup of urine in the bladder.  This is likely due to your enlarged prostate.  This seems to be the cause of your abdominal pain.  You do not have any signs of a urinary tract infection.  You had a Foley catheter placed here today to help the urine drain out of your bladder.  Please continue draining this at home as urine builds up.  Do not take this out on your own.   On the CT scan, there was also a possible lesion noted on your bladder.  I discussed this with our urologist Dr. Renda and he recommends that you follow-up with him regarding the acute urinary retention and this finding within the next week.  Please call his office to schedule an appointment.  His contact information is listed below.  You can also follow-up with your urologist as long as you can get in within the next week for voiding trial and follow-up of the CT scan results.  I have included the CT scans results below for your reference, and so that you can show your urologist.  You had a mild elevation in your kidney lab (creatinine).  This was likely due to the buildup of urine in your bladder.  This should come down now that your bladder is draining properly.  However, please have this repeated by urology or your PCP within the next week.  Please return to the ER if you have return of abdominal pain, your Foley catheter is not draining urine, you develop vomiting vomiting, fevers, any other new or concerning symptoms  EXAM: CT ABDOMEN AND PELVIS WITHOUT CONTRAST   TECHNIQUE: Multidetector CT imaging of the abdomen and pelvis was performed following the standard protocol without IV contrast.   RADIATION DOSE REDUCTION: This exam was performed according to the departmental dose-optimization program which includes automated exposure control, adjustment of the mA and/or kV according to patient size and/or use  of iterative reconstruction technique.   COMPARISON:  Chest CT September 06, 2023   FINDINGS: Evaluation is suboptimal given the lack of contrast.   Lower chest: Bilateral pulmonary nodules and masses up to 5.7 cm, stable to recent chest CT and consistent with known metastasis. Some of the lesions demonstrate punctate calcifications.   Hepatobiliary: Scattered liver lesions likely representing cysts, stable to prior measuring up to 3.7 cm. Normal gallbladder.   Pancreas: Atrophic changes of the pancreas.   Spleen: Normal in size without focal abnormality.   Adrenals/Urinary Tract: Bilateral simple renal cortical cysts measuring up to 12.6 cm which does not require imaging follow-up, gradually enlarging to prior exams from 2022 and stable to recent chest CT.   Right kidney interpolar nonobstructing nephrolithiasis measuring 4 mm. No hydronephrosis on the right. Additional punctate nephrolithiasis measuring 2 mm in right kidney interpolar region.   There is mild hydroureteronephrosis on the left without definite ureteral stone.Bladder is under distended and thick wall containing Foley catheter. Questionable mucosal lesion at the left UVJ incompletely assessed on current noncontrast CT. (Image 8/64, 2/76).   There is a focus of air within the bladder lumen likely due to recent catheterization.   Stomach/Bowel: Stomach and bowel loops appear unremarkable. No bowel obstruction. Normal appendix.   Vascular/Lymphatic: Severe atherosclerotic calcifications of aorta and branches. Numerous para-aortic and aortocaval subcentimeter and pericentimeter lymph nodes measuring up to 1 cm in short axis.   Reproductive: Prostatomegaly  protruding into bladder base. Bilateral hydroceles.   Other: Small fat containing bilateral fat containing inguinal hernias. No significant ascites.   Evaluation   Musculoskeletal: Fat attenuation right pectineus muscle lesion likely lipoma measuring 3.3 x  5.5 cm. Right gluteal muscle lipoma measuring 2.8 x 3.9 cm. Left femoral neck sclerotic lesion likely bone island.   IMPRESSION: Left-sided hydroureteronephrosis with suggestion of a mucosal lesion along the left bladder wall at the UVJ. Evaluation is suboptimal given the inadequate distension of the bladder and lack of contrast . Recommend further assessment with cystoscopy.   Nonobstructive nephrolithiasis of the right kidney.   Numerous hepatic and liver cysts are stable to prior chest CT and enlarged to prior CT abdomen from 2022.   Multiple pulmonary nodules and masses consistent with known metastasis, grossly similar to recent chest CT.   Multiple bilateral subcentimeter and pericentimeter retroperitoneal lymph nodes, reactive versus metastatic.   Severe prostatomegaly.   Additional ancillary findings as above.     Electronically Signed   By: Megan  Zare M.D.   On: 09/26/2023 20:18

## 2023-10-01 ENCOUNTER — Other Ambulatory Visit (HOSPITAL_BASED_OUTPATIENT_CLINIC_OR_DEPARTMENT_OTHER): Payer: Self-pay

## 2023-10-01 ENCOUNTER — Inpatient Hospital Stay: Attending: Hematology & Oncology

## 2023-10-01 ENCOUNTER — Encounter: Payer: Self-pay | Admitting: Hematology & Oncology

## 2023-10-01 ENCOUNTER — Inpatient Hospital Stay (HOSPITAL_BASED_OUTPATIENT_CLINIC_OR_DEPARTMENT_OTHER): Admitting: Hematology & Oncology

## 2023-10-01 VITALS — BP 117/80 | HR 14 | Temp 97.9°F | Resp 20 | Ht 75.0 in | Wt 180.1 lb

## 2023-10-01 DIAGNOSIS — D6859 Other primary thrombophilia: Secondary | ICD-10-CM

## 2023-10-01 DIAGNOSIS — C73 Malignant neoplasm of thyroid gland: Secondary | ICD-10-CM | POA: Diagnosis not present

## 2023-10-01 DIAGNOSIS — D500 Iron deficiency anemia secondary to blood loss (chronic): Secondary | ICD-10-CM | POA: Diagnosis not present

## 2023-10-01 DIAGNOSIS — Z79890 Hormone replacement therapy (postmenopausal): Secondary | ICD-10-CM | POA: Diagnosis not present

## 2023-10-01 DIAGNOSIS — D509 Iron deficiency anemia, unspecified: Secondary | ICD-10-CM | POA: Diagnosis not present

## 2023-10-01 DIAGNOSIS — C7800 Secondary malignant neoplasm of unspecified lung: Secondary | ICD-10-CM | POA: Diagnosis not present

## 2023-10-01 LAB — CBC WITH DIFFERENTIAL (CANCER CENTER ONLY)
Abs Immature Granulocytes: 0.03 K/uL (ref 0.00–0.07)
Basophils Absolute: 0 K/uL (ref 0.0–0.1)
Basophils Relative: 0 %
Eosinophils Absolute: 0.1 K/uL (ref 0.0–0.5)
Eosinophils Relative: 2 %
HCT: 40.3 % (ref 39.0–52.0)
Hemoglobin: 12.7 g/dL — ABNORMAL LOW (ref 13.0–17.0)
Immature Granulocytes: 0 %
Lymphocytes Relative: 21 %
Lymphs Abs: 1.7 K/uL (ref 0.7–4.0)
MCH: 28.7 pg (ref 26.0–34.0)
MCHC: 31.5 g/dL (ref 30.0–36.0)
MCV: 91 fL (ref 80.0–100.0)
Monocytes Absolute: 0.8 K/uL (ref 0.1–1.0)
Monocytes Relative: 9 %
Neutro Abs: 5.6 K/uL (ref 1.7–7.7)
Neutrophils Relative %: 68 %
Platelet Count: 274 K/uL (ref 150–400)
RBC: 4.43 MIL/uL (ref 4.22–5.81)
RDW: 13.1 % (ref 11.5–15.5)
WBC Count: 8.3 K/uL (ref 4.0–10.5)
nRBC: 0 % (ref 0.0–0.2)

## 2023-10-01 LAB — CMP (CANCER CENTER ONLY)
ALT: 14 U/L (ref 0–44)
AST: 22 U/L (ref 15–41)
Albumin: 3.5 g/dL (ref 3.5–5.0)
Alkaline Phosphatase: 154 U/L — ABNORMAL HIGH (ref 38–126)
Anion gap: 9 (ref 5–15)
BUN: 14 mg/dL (ref 8–23)
CO2: 27 mmol/L (ref 22–32)
Calcium: 8.6 mg/dL — ABNORMAL LOW (ref 8.9–10.3)
Chloride: 101 mmol/L (ref 98–111)
Creatinine: 1.19 mg/dL (ref 0.61–1.24)
GFR, Estimated: 58 mL/min — ABNORMAL LOW (ref >60)
Glucose, Bld: 100 mg/dL — ABNORMAL HIGH (ref 70–99)
Potassium: 4.9 mmol/L (ref 3.5–5.1)
Sodium: 137 mmol/L (ref 135–145)
Total Bilirubin: 0.5 mg/dL (ref 0.0–1.2)
Total Protein: 7 g/dL (ref 6.5–8.1)

## 2023-10-01 LAB — IRON AND IRON BINDING CAPACITY (CC-WL,HP ONLY)
Iron: 23 ug/dL — ABNORMAL LOW (ref 45–182)
Saturation Ratios: 12 % — ABNORMAL LOW (ref 17.9–39.5)
TIBC: 189 ug/dL — ABNORMAL LOW (ref 250–450)
UIBC: 166 ug/dL

## 2023-10-01 LAB — TSH: TSH: 0.676 u[IU]/mL (ref 0.350–4.500)

## 2023-10-01 LAB — FERRITIN: Ferritin: 614 ng/mL — ABNORMAL HIGH (ref 24–336)

## 2023-10-01 MED ORDER — FINASTERIDE 5 MG PO TABS
5.0000 mg | ORAL_TABLET | Freq: Every day | ORAL | 4 refills | Status: DC
  Filled 2023-10-01: qty 30, 30d supply, fill #0

## 2023-10-01 NOTE — Progress Notes (Signed)
 1 so present along working Hematology and Oncology Follow Up Visit  Clarence Hopkins [id number] August 25, 1930 89 y.o. 10/01/2023   Principle Diagnosis:  Metastatic papillary carcinoma of the thyroid  Basal cell carcinoma under the right eye. Iron deficiency anemia  Current Therapy:   S/p I-131 therapy on 08/27/2019 Vismodegib  150 mg po q day -- start on 12/01/2021 - on hold since 03/04/2022 Injectafer  750 mg IV - given on 02/24/2022      Interim History:  [name] is back for follow-up.  Unfortunately, we have a new problem now.  He comes in with a Foley catheter.  He apparently was having abdominal pain last week.  He got to the point where he had to go to the ER.  He had a Foley catheter placed.  He was able to drain quite a bit of urine.  This helped with the pain.  He had a CT of the abdomen that was done.  The CT of the abdomen showed an enlarged prostate that was pressing on his bladder.  He had stable hepatic cysts.  He had left-sided hydronephrosis.  There was a possible lesion in the bladder wall at the UVJ.  He had stable pulmonary nodules.  He had subcentimeter retroperitoneal nodes.  Again, the symptoms that urology is going to have to deal with.  I think he sees urology this week.  I will go ahead and put him on some Proscar  (5 mg p.o. daily) to try to help with his prostate enlargement.  We did do a CT of the chest.  This was done on 09/06/2023.  Everything looked stable with his pulmonary metastasis.  His right lower lobe perihilar mass measures 5.6 x 4.6 cm.  He is taking his Synthroid .  His last TSH was 0.8.  He is now 89 years old.  He seems to be holding his own.  There has been no issues with respect to the basal cell carcinoma under the right eye.  This has not recurred.  He has been off the vismodegib  now for about a year and a half.  He is not eating much.  Again this is can be a problem for him.  I told that he really needs to have small frequent meals.  Currently, I  would have to say that his performance status is probably ECOG 2 at best.      Medications:  Current Outpatient Medications:    Cholecalciferol 50 MCG (2000 UT) CAPS, Take 2,000 Units by mouth. (Patient not taking: Reported on 08/31/2023), Disp: , Rfl:    Ibuprofen-diphenhydrAMINE Cit (ADVIL PM PO), Take by mouth at bedtime as needed. (Patient not taking: Reported on 08/31/2023), Disp: , Rfl:    levothyroxine  (SYNTHROID ) 200 MCG tablet, Take 1 tablet (200 mcg total) by mouth daily with 25mcg, Disp: 90 tablet, Rfl: 3   levothyroxine  (SYNTHROID ) 25 MCG tablet, Take 1 tablet (25 mcg total) by mouth daily., Disp: 90 tablet, Rfl: 3   phytonadione  (VITAMIN K) 5 MG tablet, Take 5 mg by mouth once. (Patient not taking: Reported on 08/31/2023), Disp: , Rfl:    Polyethylene Glycol 3350  (MIRALAX  PO), Take by mouth daily. (Patient not taking: Reported on 08/31/2023), Disp: , Rfl:    Vibegron  (GEMTESA ) 75 MG TABS, Take 75 mg by mouth. (Patient not taking: Reported on 08/31/2023), Disp: , Rfl:    warfarin (COUMADIN ) 5 MG tablet, TAKE 1 TABLET BY MOUTH DAILY OR AS DIRECTED BY ANTICOAGULATION CLINIC., Disp: 100 tablet, Rfl: 1  Allergies: No Known  Allergies  Past Medical History, Surgical history, Social history, and Family History were reviewed and updated.  Review of Systems: Review of Systems  Constitutional: Negative.   HENT:  Negative.    Eyes: Negative.   Respiratory: Negative.    Cardiovascular: Negative.   Gastrointestinal: Negative.   Endocrine: Negative.   Genitourinary: Negative.    Musculoskeletal: Negative.   Skin: Negative.   Neurological: Negative.   Hematological: Negative.   Psychiatric/Behavioral: Negative.      Physical Exam: His vital signs show a temperature of 97.9.  Pulse 104.  Blood pressure 117/80.  Weight is 180 pounds.   Wt Readings from Last 3 Encounters:  09/26/23 185 lb (83.9 kg)  08/31/23 (P) 183 lb (83 kg)  08/30/23 184 lb (83.5 kg)    Physical Exam Vitals  reviewed.  HENT:     Head: Normocephalic and atraumatic.     Comments: The area under his right eye looks quite good.  I really do not see any obvious lesion.  There may be a little bit of fullness.  He has good extraocular muscle movement.   Eyes:     Pupils: Pupils are equal, round, and reactive to light.  Cardiovascular:     Heart sounds: Normal heart sounds.     Comments: Cardiac exam is tachycardic but regular.  He does have occasional extra beats.  He does have an occasional skipped beat.  He has 1/6 systolic ejection murmur. Pulmonary:     Effort: Pulmonary effort is normal.     Breath sounds: Normal breath sounds.     Comments: His lungs sound clear bilaterally.  He has good air movement bilaterally.  I do not hear any wheezing. Abdominal:     General: Bowel sounds are normal.     Palpations: Abdomen is soft.  Musculoskeletal:        General: No tenderness or deformity. Normal range of motion.     Cervical back: Normal range of motion.  Lymphadenopathy:     Cervical: No cervical adenopathy.  Skin:    General: Skin is warm and dry.     Findings: No erythema or rash.  Neurological:     Mental Status: He is alert and oriented to person, place, and time.  Psychiatric:        Behavior: Behavior normal.        Thought Content: Thought content normal.        Judgment: Judgment normal.      Lab Results  Component Value Date   WBC 8.3 10/01/2023   HGB 12.7 (L) 10/01/2023   HCT 40.3 10/01/2023   MCV 91.0 10/01/2023   PLT 274 10/01/2023     Chemistry      Component Value Date/Time   NA 135 09/26/2023 1149   NA 142 11/29/2020 0000   K 4.7 09/26/2023 1149   CL 101 09/26/2023 1149   CO2 26 09/26/2023 1149   BUN 23 09/26/2023 1149   BUN 19 11/29/2020 0000   CREATININE 1.82 (H) 09/26/2023 1149   CREATININE 1.00 08/31/2023 1502   GLU 95 11/29/2020 0000      Component Value Date/Time   CALCIUM 8.7 (L) 09/26/2023 1149   ALKPHOS 143 (H) 09/26/2023 1149   AST 20  09/26/2023 1149   AST 16 08/31/2023 1502   ALT 15 09/26/2023 1149   ALT 10 08/31/2023 1502   BILITOT 1.1 09/26/2023 1149   BILITOT 0.8 08/31/2023 1502      Impression and Plan: Mr. Bodkin  is a very nice 89 year old white male.  He has metastatic thyroid  cancer.  This is a differentiated thyroid  cancer.  Thankfully it has been responsive to the radioactive iodine.   Hopefully, him taking his Synthroid  will help with the thyroid  cancer again.  We will see what his thyroglobulin level is.  Again the active problem now is his prostate enlargement.  He has a Foley catheter in.  He sees Urology this week.  We will have to see what they we will do for him.  I will start him on Proscar  and see if this can help a little bit.  I just feel bad that he has these issues.  I probably will see him back in a month.  Hopefully by then, he will not have this Foley catheter in.   Maude JONELLE Crease, MD 8/18/20253:19 PM

## 2023-10-04 ENCOUNTER — Other Ambulatory Visit (HOSPITAL_BASED_OUTPATIENT_CLINIC_OR_DEPARTMENT_OTHER): Payer: Self-pay

## 2023-10-04 DIAGNOSIS — N130 Hydronephrosis with ureteropelvic junction obstruction: Secondary | ICD-10-CM | POA: Diagnosis not present

## 2023-10-04 DIAGNOSIS — R338 Other retention of urine: Secondary | ICD-10-CM | POA: Diagnosis not present

## 2023-10-04 MED ORDER — SILODOSIN 4 MG PO CAPS
4.0000 mg | ORAL_CAPSULE | Freq: Every day | ORAL | 3 refills | Status: DC
  Filled 2023-10-04: qty 30, 30d supply, fill #0

## 2023-10-09 ENCOUNTER — Ambulatory Visit (INDEPENDENT_AMBULATORY_CARE_PROVIDER_SITE_OTHER)

## 2023-10-09 DIAGNOSIS — Z7901 Long term (current) use of anticoagulants: Secondary | ICD-10-CM | POA: Diagnosis not present

## 2023-10-09 LAB — POCT INR: INR: 5.8 — AB (ref 2.0–3.0)

## 2023-10-09 NOTE — Progress Notes (Cosign Needed Addendum)
 Pt does report he has no appetite anymore but this has been a chronic problem for some time now. This may explain why INR has been erratic. Provided pt with samples of Boost and Ensure to see if he can possible drink one of these a day. Pt was in ER on 8/13 for urinary retention. He has seen urology and a cath was placed. Pt reports he has had a spot of blood on his underpants in the morning when he wakes but no hematuria. Pt has f/u with urology tomorrow. Advised if any s/s of bleeding or abnormal bruising go to ER. Pt verbalized understanding. Pt was in the office today with his wife and she verbalized understanding also. Pt brought his AVS from his last coumadin  clinic apt to show he had each day marked that he took the warfarin. Pt took all as prescribed. Hold warfarin today and hold dose tomorrow and then change weekly dose to take 1/2 tablet daily. Recheck in 2 week. Pt denies any other s/s of bleeding or abnormal bruising. Advised if any s/s to go to ER. Both he and his wife verbalized understanding.   Medical screening examination/treatment/procedure(s) were performed by non-physician practitioner and as supervising physician I was immediately available for consultation/collaboration.  I agree with above. Karlynn Noel, MD

## 2023-10-09 NOTE — Patient Instructions (Addendum)
 Pre visit review using our clinic review tool, if applicable. No additional management support is needed unless otherwise documented below in the visit note.  Hold warfarin today and hold dose tomorrow and then change weekly dose to take 1/2 tablet daily. Recheck in 2 week.

## 2023-10-10 ENCOUNTER — Telehealth: Payer: Self-pay | Admitting: Dietician

## 2023-10-10 DIAGNOSIS — N401 Enlarged prostate with lower urinary tract symptoms: Secondary | ICD-10-CM | POA: Diagnosis not present

## 2023-10-10 DIAGNOSIS — R338 Other retention of urine: Secondary | ICD-10-CM | POA: Diagnosis not present

## 2023-10-10 DIAGNOSIS — N130 Hydronephrosis with ureteropelvic junction obstruction: Secondary | ICD-10-CM | POA: Diagnosis not present

## 2023-10-10 DIAGNOSIS — N281 Cyst of kidney, acquired: Secondary | ICD-10-CM | POA: Diagnosis not present

## 2023-10-10 NOTE — Telephone Encounter (Signed)
Patient screened on MST. Second attempt to reach. Provided my cell# on voice mail and in text to return call to set up a nutrition consult.  Cyndi Dinger, RDN, LDN Registered Dietitian, [facility] Part Time Remote (Usual office hours: Tuesday-Thursday) Cell: [phone]   

## 2023-10-11 DIAGNOSIS — N132 Hydronephrosis with renal and ureteral calculous obstruction: Secondary | ICD-10-CM | POA: Diagnosis not present

## 2023-10-11 DIAGNOSIS — R3914 Feeling of incomplete bladder emptying: Secondary | ICD-10-CM | POA: Diagnosis not present

## 2023-10-11 DIAGNOSIS — R338 Other retention of urine: Secondary | ICD-10-CM | POA: Diagnosis not present

## 2023-10-11 DIAGNOSIS — N1330 Unspecified hydronephrosis: Secondary | ICD-10-CM | POA: Diagnosis not present

## 2023-10-11 DIAGNOSIS — C439 Malignant melanoma of skin, unspecified: Secondary | ICD-10-CM | POA: Diagnosis not present

## 2023-10-11 DIAGNOSIS — K769 Liver disease, unspecified: Secondary | ICD-10-CM | POA: Diagnosis not present

## 2023-10-11 DIAGNOSIS — N130 Hydronephrosis with ureteropelvic junction obstruction: Secondary | ICD-10-CM | POA: Diagnosis not present

## 2023-10-11 LAB — THYROGLOBULIN LEVEL: Thyroglobulin: 40 ng/mL — ABNORMAL HIGH

## 2023-10-15 DIAGNOSIS — N390 Urinary tract infection, site not specified: Secondary | ICD-10-CM | POA: Diagnosis not present

## 2023-10-16 ENCOUNTER — Telehealth: Payer: Self-pay

## 2023-10-16 ENCOUNTER — Encounter: Payer: Self-pay | Admitting: Orthopedic Surgery

## 2023-10-16 ENCOUNTER — Non-Acute Institutional Stay (SKILLED_NURSING_FACILITY): Payer: Self-pay | Admitting: Orthopedic Surgery

## 2023-10-16 DIAGNOSIS — R1030 Lower abdominal pain, unspecified: Secondary | ICD-10-CM

## 2023-10-16 DIAGNOSIS — D6859 Other primary thrombophilia: Secondary | ICD-10-CM | POA: Diagnosis not present

## 2023-10-16 DIAGNOSIS — C73 Malignant neoplasm of thyroid gland: Secondary | ICD-10-CM | POA: Diagnosis not present

## 2023-10-16 DIAGNOSIS — R338 Other retention of urine: Secondary | ICD-10-CM

## 2023-10-16 DIAGNOSIS — N401 Enlarged prostate with lower urinary tract symptoms: Secondary | ICD-10-CM | POA: Diagnosis not present

## 2023-10-16 DIAGNOSIS — Z7189 Other specified counseling: Secondary | ICD-10-CM

## 2023-10-16 DIAGNOSIS — I480 Paroxysmal atrial fibrillation: Secondary | ICD-10-CM | POA: Diagnosis not present

## 2023-10-16 DIAGNOSIS — Z86718 Personal history of other venous thrombosis and embolism: Secondary | ICD-10-CM

## 2023-10-16 DIAGNOSIS — R531 Weakness: Secondary | ICD-10-CM | POA: Diagnosis not present

## 2023-10-16 DIAGNOSIS — Z7901 Long term (current) use of anticoagulants: Secondary | ICD-10-CM | POA: Diagnosis not present

## 2023-10-16 NOTE — Progress Notes (Signed)
 [facility] Room Number: 152-P Place of Service:  SNF (31) Provider:  Greig Cluster, NP  PCP: Garald Karlynn GAILS, MD  Patient Care Team: Garald Karlynn GAILS, MD as PCP - General (Internal Medicine) Rosan Credit, MD as Consulting Physician (Ophthalmology) Joshua Blamer, MD as Consulting Physician (Dermatology) Joshua Joesph Nett, MD as Referring Physician (Endocrinology) Timmy Maude SAUNDERS, MD as Medical Oncologist (Oncology) Szabat, Toribio BROCKS, [facility] (Inactive) (Pharmacist) Szabat, Toribio BROCKS, [facility] (Inactive) as Pharmacist (Pharmacist) Izell Domino, MD as Consulting Physician (Radiation Oncology) Malmfelt, Delon CROME, RN as Oncology Nurse Navigator Odella Refugio PARAS, MD as Consulting Physician (Ophthalmology) Plotnikov, Karlynn GAILS, MD as Consulting Physician (Internal Medicine) Pa, [facility] as Consulting Physician (Ophthalmology)  Extended Emergency Contact Information Primary Emergency Contact: Jaffe,[name] Address: [address] [place], [place] Home Phone: [phone] Work Phone: [phone] Mobile Phone: [phone] Relation: Spouse  Code Status:  FULL CODE Goals of care: Advanced Directive information    10/16/2023   11:41 AM  Advanced Directives  Does Patient Have a Medical Advance Directive? Yes  Type of Estate agent of [place];Living will  Does patient want to make changes to medical advance directive? No - Patient declined  Copy of Healthcare Power of Attorney in Chart? No - copy requested     Chief Complaint  Patient presents with   Abdominal Pain    Lower abdominal pain.    HPI:  Pt is a 89 y.o. male seen today for acute visit due to lower abdominal pain.   He was recently admitted to rehab 09/01 due to lower abdominal pain. 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   He had worsening lower abdominal pain that began a few days ago. He went to local urgent care 09/01 and was diagnosed with UTI. He was not placed on antibiotics. Unknown if urine culture was collected. He rates low abdominal pain as 9/10, minute intervals of sharp/shooting pain over bladder area, no radiation. He is followed by urology, Dr. Watt. 08/13 he presented to the ED due to acute urinary retention. Oncology had recently start finasteride  due to retention. Bladder scan was >800 cc. Foley was placed with 1600 cc output. Creatinine was also elevated at 1.82. CT abdomen noted left hydronephrosis and possible mucosal lesion along left bladder and Nonobstructive nephrolithiasis of the right kidney. He was discharged with foley and advised to f/u with urology. 08/27 cystoscopy performed, foley removed. 08/28 he passed voiding trial, started on silodosin > which was increased to BID. Flomax  was discontinued. He is not taking finasteride  at this time.   This morning, bladder scan > 400 cc. Foley was placed and > 600 cc. He reported some relief to lower abdomen after foley was placed. Nursing is trying to reach Dr. Watt for future instruction.   He is having normal bowel movements. Refused to be checked for impaction. Patient and family declined lab work or abdominal xray.   Admits to weakness. No recent falls or injury. PT/OT ordered.   INR 5.8 10/09/2023. Repeat INR 2.4 this morning. Remains on warfarin.    Past Medical History:  Diagnosis Date   Arthritis    Cancer (HCC) 2003   melanoma  L ear   GERD (gastroesophageal reflux disease)    Hypothyroidism    post  op   Lung mass    Pulmonary embolism (HCC) 2005 & 2007   protein C deficiency   Past Surgical History:  Procedure Laterality Date   BRONCHIAL BRUSHINGS  07/15/2019   Procedure: BRONCHIAL BRUSHINGS;  Surgeon: Jude Harden GAILS, MD;  Location: [facility] ENDOSCOPY;   Service: Cardiopulmonary;;   BRONCHIAL WASHINGS  07/15/2019   Procedure: BRONCHIAL WASHINGS;  Surgeon: Jude Harden GAILS, MD;  Location: [facility] ENDOSCOPY;  Service: Cardiopulmonary;;   CARDIAC CATHETERIZATION     > 20 years ago  everything was okay    CERVICAL FUSION      X 2; Dr Unice   COLONOSCOPY     ENDOBRONCHIAL ULTRASOUND N/A 07/15/2019   Procedure: ENDOBRONCHIAL ULTRASOUND;  Surgeon: Jude Harden GAILS, MD;  Location: [facility] ENDOSCOPY;  Service: Cardiopulmonary;  Laterality: N/A;   FINE NEEDLE ASPIRATION  07/15/2019   Procedure: FINE NEEDLE ASPIRATION (FNA) LINEAR;  Surgeon: Jude Harden GAILS, MD;  Location: [facility] ENDOSCOPY;  Service: Cardiopulmonary;;   FINGER ARTHROPLASTY Right 04/17/2013   Procedure: IMPLANT ARTHROPLASTY RIGHT INDEX;  Surgeon: Lamar GAILS Leonor Mickey., MD;  Location: [facility];  Service: Orthopedics;  Laterality: Right;   LUMBAR LAMINECTOMY  2010   Dr Canary   MELANOMA EXCISION  2003   L ear   THYROIDECTOMY  1971   cytology indefinite   TONSILLECTOMY     TOTAL KNEE ARTHROPLASTY  2004   left   VIDEO BRONCHOSCOPY N/A 07/15/2019   Procedure: VIDEO BRONCHOSCOPY WITHOUT FLUORO;  Surgeon: Jude Harden GAILS, MD;  Location: [facility] ENDOSCOPY;  Service: Cardiopulmonary;  Laterality: N/A;   WISDOM TOOTH EXTRACTION      No Known Allergies  Allergies as of 10/16/2023   No Known Allergies      Medication List        Accurate as of October 16, 2023 11:41 AM. If you have any questions, ask your nurse or doctor.          acetaminophen  650 MG CR tablet Commonly known as: TYLENOL  Take 650 mg by mouth every 4 (four) hours as needed for pain.   ADVIL PM PO Take by mouth at bedtime as needed.   Cholecalciferol 50 MCG (2000 UT) Caps Take 2,000 Units by mouth.   finasteride  5 MG tablet Commonly known as: Proscar  Take 1 tablet (5 mg total) by mouth daily.   Gemtesa  75 MG Tabs Generic drug: Vibegron  Take 75 mg by mouth.   Ibuprofen-diphenhydrAMINE HCl 200-25 MG Caps Take 1  capsule by mouth as needed.   levothyroxine  25 MCG tablet Commonly known as: SYNTHROID  Take 1 tablet (25 mcg total) by mouth daily.   levothyroxine  200 MCG tablet Commonly known as: SYNTHROID  Take 1 tablet (200 mcg total) by mouth daily with 25mcg   polyethylene glycol 17 g packet Commonly known as: MIRALAX  / GLYCOLAX  Take 17 g by mouth every morning.   silodosin  4 MG Caps capsule Commonly known as: Rapaflo  Take 1 capsule (4 mg total) by mouth daily.   tamsulosin  0.4 MG Caps capsule Commonly known as: FLOMAX  Take 0.4 mg by mouth at bedtime.   Tubersol 5 UNIT/0.1ML injection Generic drug: tuberculin Inject 0.1 mLs into the skin every evening.   warfarin 2.5 MG tablet Commonly known as: COUMADIN  Take as directed by the anticoagulation clinic. If you are unsure how to take this medication, talk to your nurse or doctor. Original instructions: Take 2.5 mg by mouth every Friday. What changed: Another medication with the same name was changed. Make sure you  understand how and when to take each.   warfarin 5 MG tablet Commonly known as: COUMADIN  Take as directed by the anticoagulation clinic. If you are unsure how to take this medication, talk to your nurse or doctor. Original instructions: Take 5 mg by mouth as directed. Monday, Tuesday, Wednesday, Thursday, Saturday, and Sunday. What changed: Another medication with the same name was changed. Make sure you understand how and when to take each.   warfarin 5 MG tablet Commonly known as: COUMADIN Take as directed by the anticoagulation clinic. If you are unsure how to take this medication, talk to your nurse or doctor. Original instructions: TAKE 1 TABLET BY MOUTH DAILY OR AS DIRECTED BY ANTICOAGULATION CLINIC. What changed: additional instructions        Review of Systems  Constitutional: Negative.   HENT: Negative.    Respiratory:  Negative for cough and shortness of breath.   Cardiovascular:  Negative for chest pain and  leg swelling.  Gastrointestinal:  Positive for abdominal pain. Negative for abdominal distention, constipation, diarrhea, nausea and vomiting.  Genitourinary:  Positive for decreased urine volume and frequency. Negative for dysuria, hematuria and penile pain.  Musculoskeletal:  Positive for gait problem.  Skin:  Negative for wound.  Neurological:  Positive for weakness. Negative for dizziness, light-headedness and headaches.  Psychiatric/Behavioral:  Negative for confusion and dysphoric mood. The patient is not nervous/anxious.     Immunization History  Administered Date(s) Administered   Fluad Trivalent(High Dose 65+) 11/16/2022   INFLUENZA, HIGH DOSE SEASONAL PF 11/23/2015, 11/27/2016, 11/27/2017, 11/14/2018   Influenza Split 11/13/2012   Influenza Whole 11/13/2009, 11/14/2011   Influenza-Unspecified 12/08/2013, 12/07/2014   PFIZER Comirnaty(Gray Top)Covid-19 Tri-Sucrose Vaccine 07/05/2020   PFIZER(Purple Top)SARS-COV-2 Vaccination 03/02/2019, 03/19/2019, 10/23/2019   Pneumococcal Conjugate-13 03/05/2017   Pneumococcal Polysaccharide-23 02/04/2016   Zoster, Live 03/14/2006   Pertinent  Health Maintenance Due  Topic Date Due   INFLUENZA VACCINE  09/14/2023      10 /16/2023    4:16 PM 12/28/2021    3:29 PM 12/30/2021    1:30 PM 02/24/2022    1:00 PM 08/30/2023    2:34 PM  Fall Risk  Falls in the past year?  0   0  Was there an injury with Fall?  0   0  Fall Risk Category Calculator  0   0  Fall Risk Category (Retired)  Low      (RETIRED) Patient Fall Risk Level High fall risk  Low fall risk  Low fall risk  High fall risk    Patient at Risk for Falls Due to  No Fall Risks   No Fall Risks  Fall risk Follow up  Falls prevention discussed    Falls evaluation completed     Data saved with a previous flowsheet row definition   Functional Status Survey:    Vitals:   10/16/23 1110  BP: 120/72  Pulse: 77  Resp: 12  Temp: 98.3 F (36.8 C)  SpO2: 95%  Weight: 177 lb 3.2 oz  (80.4 kg)  Height: 6' 3 (1.905 m)   Body mass index is 22.15 kg/m. Physical Exam Vitals reviewed.  Constitutional:      General: He is not in acute distress. HENT:     Head: Normocephalic.  Eyes:     General:        Right eye: No discharge.        Left eye: No discharge.  Cardiovascular:     Rate and Rhythm: Normal rate and regular  rhythm.     Pulses: Normal pulses.     Heart sounds: Normal heart sounds.  Pulmonary:     Effort: Pulmonary effort is normal.     Breath sounds: Normal breath sounds.  Abdominal:     General: Bowel sounds are normal. There is no distension.     Palpations: Abdomen is soft.     Tenderness: There is no abdominal tenderness.  Genitourinary:    Comments: Foley placed, UOP > 600 cc, urine yellow and clear Musculoskeletal:     Cervical back: Neck supple.     Right lower leg: No edema.     Left lower leg: No edema.  Skin:    General: Skin is warm.     Capillary Refill: Capillary refill takes less than 2 seconds.  Neurological:     General: No focal deficit present.     Mental Status: He is alert and oriented to person, place, and time.     Motor: Weakness present.     Gait: Gait abnormal.  Psychiatric:        Mood and Affect: Mood normal.     Labs reviewed: Recent Labs    09/06/23 1216 09/26/23 1149 10/01/23 1512  NA 137 135 137  K 4.9 4.7 4.9  CL 102 101 101  CO2 28 26 27   GLUCOSE 83 89 100*  BUN 13 23 14   CREATININE 1.03 1.82* 1.19  CALCIUM 8.7* 8.7* 8.6*   Recent Labs    09/06/23 1216 09/26/23 1149 10/01/23 1512  AST 22 20 22   ALT 13 15 14   ALKPHOS 132* 143* 154*  BILITOT 0.6 1.1 0.5  PROT 6.8 7.8 7.0  ALBUMIN 3.5 3.5 3.5   Recent Labs    08/31/23 1502 09/06/23 1216 09/26/23 1149 10/01/23 1512  WBC 7.0 5.5 7.4 8.3  NEUTROABS 4.4 3.2  --  5.6  HGB 11.8* 11.2* 13.1 12.7*  HCT 37.2* 35.9* 42.5 40.3  MCV 89.4 90.2 91.2 91.0  PLT 282 237 253 274   Lab Results  Component Value Date   TSH 0.676 10/01/2023   No  results found for: HGBA1C Lab Results  Component Value Date   CHOL 141 03/14/2019   HDL 38.00 (L) 03/14/2019   LDLCALC 88 03/14/2019   LDLDIRECT 116.0 03/31/2013   TRIG 74.0 03/14/2019   CHOLHDL 4 03/14/2019    Significant Diagnostic Results in last 30 days:  CT ABDOMEN PELVIS WO CONTRAST Result Date: 09/26/2023 CLINICAL DATA:  Abdominal pain, dysuria, history of melanoma, history of thyroid  cancer, pulmonary metastasis. EXAM: CT ABDOMEN AND PELVIS WITHOUT CONTRAST TECHNIQUE: Multidetector CT imaging of the abdomen and pelvis was performed following the standard protocol without IV contrast. RADIATION DOSE REDUCTION: This exam was performed according to the departmental dose-optimization program which includes automated exposure control, adjustment of the mA and/or kV according to patient size and/or use of iterative reconstruction technique. COMPARISON:  Chest CT September 06, 2023 FINDINGS: Evaluation is suboptimal given the lack of contrast. Lower chest: Bilateral pulmonary nodules and masses up to 5.7 cm, stable to recent chest CT and consistent with known metastasis. Some of the lesions demonstrate punctate calcifications. Hepatobiliary: Scattered liver lesions likely representing cysts, stable to prior measuring up to 3.7 cm. Normal gallbladder. Pancreas: Atrophic changes of the pancreas. Spleen: Normal in size without focal abnormality. Adrenals/Urinary Tract: Bilateral simple renal cortical cysts measuring up to 12.6 cm which does not require imaging follow-up, gradually enlarging to prior exams from 2022 and stable to recent chest CT. Right  kidney interpolar nonobstructing nephrolithiasis measuring 4 mm. No hydronephrosis on the right. Additional punctate nephrolithiasis measuring 2 mm in right kidney interpolar region. There is mild hydroureteronephrosis on the left without definite ureteral stone.Bladder is under distended and thick wall containing Foley catheter. Questionable mucosal lesion at  the left UVJ incompletely assessed on current noncontrast CT. (Image 8/64, 2/76). There is a focus of air within the bladder lumen likely due to recent catheterization. Stomach/Bowel: Stomach and bowel loops appear unremarkable. No bowel obstruction. Normal appendix. Vascular/Lymphatic: Severe atherosclerotic calcifications of aorta and branches. Numerous para-aortic and aortocaval subcentimeter and pericentimeter lymph nodes measuring up to 1 cm in short axis. Reproductive: Prostatomegaly protruding into bladder base. Bilateral hydroceles. Other: Small fat containing bilateral fat containing inguinal hernias. No significant ascites. Evaluation Musculoskeletal: Fat attenuation right pectineus muscle lesion likely lipoma measuring 3.3 x 5.5 cm. Right gluteal muscle lipoma measuring 2.8 x 3.9 cm. Left femoral neck sclerotic lesion likely bone island. IMPRESSION: Left-sided hydroureteronephrosis with suggestion of a mucosal lesion along the left bladder wall at the UVJ. Evaluation is suboptimal given the inadequate distension of the bladder and lack of contrast . Recommend further assessment with cystoscopy. Nonobstructive nephrolithiasis of the right kidney. Numerous hepatic and liver cysts are stable to prior chest CT and enlarged to prior CT abdomen from 2022. Multiple pulmonary nodules and masses consistent with known metastasis, grossly similar to recent chest CT. Multiple bilateral subcentimeter and pericentimeter retroperitoneal lymph nodes, reactive versus metastatic. Severe prostatomegaly. Additional ancillary findings as above. Electronically Signed   By: Megan  Zare M.D.   On: 09/26/2023 20:18    Assessment/Plan 1. Enlarged prostate with urinary retention (Primary) - 08/13 ED visit> CT abdomen noted left hydronephrosis and possible mucosal lesion along left bladder and Nonobstructive nephrolithiasis of the right kidney, severe prostatomegaly  - foley from 08/13-08/27 - per urology notes: concerns for  prostate involvement> PSA now 9.86> was 8.08 (02/2018) - bladder scan > 400 cc today> foley placed  - finasteride  and tamsulosin  stopped 08/28 per urology - cont silodosin  BID - awaiting further instruction from Dr. Watt  2. Lower abdominal pain - see above - ? Bladder spasms - bowel movements appear to be regular - family declined lab work and KUB  3. PULMONARY EMBOLISM, HX OF - recent INR 2.4 10/16/2023 - cont warfarin  4. Thyroid  cancer (HCC) - followed by Dr. Timmy - mets to lung - s/p radioactive iodine - cont levothyroxine   5. Protein C deficiency (HCC) - followed by oncology  6. Generalized weakness - see above - PT/OT evaluation  7. Advanced directives, counseling/discussion - patient would like to be DNR   Family/ staff Communication: plan discussed with patient, daughter and nurse  Labs/tests ordered:  none

## 2023-10-16 NOTE — Telephone Encounter (Signed)
 Copied from CRM #[id number]. Topic: Clinical - Medical Advice >> Oct 16, 2023 12:40 PM Viola F wrote: Reason for CRM: Patient has appt 10/23/23 with coumadin  clinic, Tabitha from [facility] wants to know if they can do patients lab work at their rehab instead of patient going to appt. Please call her at [phone] and let her know.

## 2023-10-17 ENCOUNTER — Telehealth: Payer: Self-pay | Admitting: Hematology & Oncology

## 2023-10-17 NOTE — Telephone Encounter (Signed)
 Called to schedule infusion per inbasket. LVM to return call for scheduling.

## 2023-10-18 ENCOUNTER — Telehealth: Payer: Self-pay | Admitting: Dietician

## 2023-10-18 ENCOUNTER — Encounter: Payer: Self-pay | Admitting: Adult Health

## 2023-10-18 ENCOUNTER — Non-Acute Institutional Stay (SKILLED_NURSING_FACILITY): Payer: Self-pay | Admitting: Adult Health

## 2023-10-18 ENCOUNTER — Other Ambulatory Visit (HOSPITAL_COMMUNITY): Payer: Self-pay | Admitting: Urology

## 2023-10-18 ENCOUNTER — Encounter: Payer: Self-pay | Admitting: Dietician

## 2023-10-18 DIAGNOSIS — R339 Retention of urine, unspecified: Secondary | ICD-10-CM

## 2023-10-18 DIAGNOSIS — I951 Orthostatic hypotension: Secondary | ICD-10-CM | POA: Diagnosis not present

## 2023-10-18 DIAGNOSIS — R278 Other lack of coordination: Secondary | ICD-10-CM | POA: Diagnosis not present

## 2023-10-18 DIAGNOSIS — R2689 Other abnormalities of gait and mobility: Secondary | ICD-10-CM | POA: Diagnosis not present

## 2023-10-18 DIAGNOSIS — R972 Elevated prostate specific antigen [PSA]: Secondary | ICD-10-CM

## 2023-10-18 DIAGNOSIS — N179 Acute kidney failure, unspecified: Secondary | ICD-10-CM | POA: Diagnosis not present

## 2023-10-18 NOTE — Progress Notes (Unsigned)
 Location:  Medical illustrator of Service:  SNF (31) Provider:  Tawni America, NP    Patient Care Team: Plotnikov, Karlynn GAILS, MD as PCP - General (Internal Medicine) Rosan Credit, MD as Consulting Physician (Ophthalmology) Joshua Blamer, MD as Consulting Physician (Dermatology) Joshua Joesph Nett, MD as Referring Physician (Endocrinology) Timmy Maude SAUNDERS, MD as Medical Oncologist (Oncology) Szabat, Toribio BROCKS, [facility] (Inactive) (Pharmacist) Szabat, Toribio BROCKS, [facility] (Inactive) as Pharmacist (Pharmacist) Izell Domino, MD as Consulting Physician (Radiation Oncology) Malmfelt, Delon CROME, RN as Oncology Nurse Navigator Odella Refugio PARAS, MD as Consulting Physician (Ophthalmology) Plotnikov, Karlynn GAILS, MD as Consulting Physician (Internal Medicine) Pa, [facility] as Consulting Physician (Ophthalmology)  Extended Emergency Contact Information Primary Emergency Contact: Norbeck,[name] Address: [address] [place], [place] Home Phone: [phone] Work Phone: [phone] Mobile Phone: [phone] Relation: Spouse  Code Status:  DNR Goals of care: Advanced Directive information    10/16/2023   11:41 AM  Advanced Directives  Does Patient Have a Medical Advance Directive? Yes  Type of Estate agent of [place];Living will  Does patient want to make changes to medical advance directive? No - Patient declined  Copy of Healthcare Power of Attorney in Chart? No - copy requested     Chief Complaint  Patient presents with  . Acute Visit    Low BP    HPI:  The patient is a 89 year old with urinary retention from [place] currently resides in skilled rehab With concerns for low bp  Orthostatic hypotension and dizziness - Orthostatic hypotension with blood pressure of 130/80 mmHg while sitting, dropping to 90/60 mmHg after physical therapy - Dizziness occurs only when standing and  engaging in therapy - Dizziness leads to cessation of physical therapy - Minimal food intake this morning, limited to fruit - Inadequate fluid intake  Urinary retention and lower urinary tract findings - Admitted to rehabilitation on September 1st with lower abdominal pain and weakness - Acute urinary retention on August 13th, resulting in Foley catheter placement - Creatinine elevated at 1.82 mg/dL at time of retention - CT abdomen showed left hydronephrosis, possible mucosal lesion on left bladder side, and right kidney nonobstructive nephrolithiasis - Foley catheter reinserted on September 2nd due to ongoing urinary retention - Started on Rapaflo  after catheter reinsertion - Overnight Foley catheter output was 500 cc  Anticoagulation management for pulmonary embolism - History of pulmonary embolism - INR was 1.9 on September 3rd - Ongoing communication with Coumadin  clinic for anticoagulation management   Past Medical History:  Diagnosis Date  . Arthritis   . Cancer (HCC) 2003   melanoma  L ear  . GERD (gastroesophageal reflux disease)   . Hypothyroidism    post op  . Lung mass   . Pulmonary embolism (HCC) 2005 & 2007   protein C deficiency   Past Surgical History:  Procedure Laterality Date  . BRONCHIAL BRUSHINGS  07/15/2019   Procedure: BRONCHIAL BRUSHINGS;  Surgeon: Jude Harden GAILS, MD;  Location: [facility] ENDOSCOPY;  Service: Cardiopulmonary;;  . BRONCHIAL WASHINGS  07/15/2019   Procedure: BRONCHIAL WASHINGS;  Surgeon: Jude Harden GAILS, MD;  Location: [facility] ENDOSCOPY;  Service: Cardiopulmonary;;  . CARDIAC CATHETERIZATION     > 20 years ago  everything was okay   . CERVICAL FUSION      X 2; Dr Unice  . COLONOSCOPY    . ENDOBRONCHIAL ULTRASOUND N/A 07/15/2019  Procedure: ENDOBRONCHIAL ULTRASOUND;  Surgeon: Jude Harden GAILS, MD;  Location: [facility] ENDOSCOPY;  Service: Cardiopulmonary;  Laterality: N/A;  . FINE NEEDLE ASPIRATION  07/15/2019   Procedure: FINE NEEDLE ASPIRATION (FNA) LINEAR;   Surgeon: Jude Harden GAILS, MD;  Location: [facility] ENDOSCOPY;  Service: Cardiopulmonary;;  . FINGER ARTHROPLASTY Right 04/17/2013   Procedure: IMPLANT ARTHROPLASTY RIGHT INDEX;  Surgeon: Lamar GAILS Leonor Mickey., MD;  Location: [facility];  Service: Orthopedics;  Laterality: Right;  . LUMBAR LAMINECTOMY  2010   Dr Canary  . MELANOMA EXCISION  2003   L ear  . THYROIDECTOMY  1971   cytology indefinite  . TONSILLECTOMY    . TOTAL KNEE ARTHROPLASTY  2004   left  . VIDEO BRONCHOSCOPY N/A 07/15/2019   Procedure: VIDEO BRONCHOSCOPY WITHOUT FLUORO;  Surgeon: Jude Harden GAILS, MD;  Location: [facility] ENDOSCOPY;  Service: Cardiopulmonary;  Laterality: N/A;  . WISDOM TOOTH EXTRACTION      No Known Allergies  Outpatient Encounter Medications as of 10/18/2023  Medication Sig  . acetaminophen  (TYLENOL ) 650 MG CR tablet Take 650 mg by mouth every 4 (four) hours as needed for pain.  SABRA HYDROcodone-acetaminophen  (NORCO/VICODIN) 5-325 MG tablet Take 1 tablet by mouth every 6 (six) hours as needed for moderate pain (pain score 4-6).  . Ibuprofen-diphenhydrAMINE HCl 200-25 MG CAPS Take 1 capsule by mouth as needed.  . levothyroxine  (SYNTHROID ) 200 MCG tablet Take 1 tablet (200 mcg total) by mouth daily with 25mcg  . levothyroxine  (SYNTHROID ) 25 MCG tablet Take 1 tablet (25 mcg total) by mouth daily.  . Magnesium Hydroxide (MILK OF MAGNESIA PO) Take 45 mLs by mouth as directed. Give 45 ml by mouth every 24 hours as needed for Constipation 45 cc po daily prn except with renal failure  . polyethylene glycol (MIRALAX  / GLYCOLAX ) 17 g packet Take 17 g by mouth every morning.  . silodosin  (RAPAFLO ) 4 MG CAPS capsule Take 4 mg by mouth 2 (two) times daily. Give 1 capsule by mouth two times a day for Urinary Retention  . sodium phosphate  Pediatric (FLEET) 3.5-9.5 GM/59ML enema Place 1 enema rectally as directed. Insert 1 unit rectally every 24 hours as needed for Constipation Not to exceed 3 consecutive days  . tuberculin  (TUBERSOL) 5 UNIT/0.1ML injection Inject 0.1 mLs into the skin every evening.  . Vibegron  (GEMTESA ) 75 MG TABS Take 75 mg by mouth daily.  SABRA warfarin (COUMADIN ) 2.5 MG tablet Take 2.5 mg by mouth every Friday.  . warfarin (COUMADIN ) 5 MG tablet Take 5 mg by mouth as directed. Monday, Tuesday, Wednesday, Thursday, Saturday, and Sunday.  . silodosin  (RAPAFLO ) 4 MG CAPS capsule Take 1 capsule (4 mg total) by mouth daily. (Patient not taking: Reported on 10/18/2023)   No facility-administered encounter medications on file as of 10/18/2023.    Review of Systems  Constitutional:  Positive for activity change. Negative for appetite change, chills, diaphoresis, fatigue, fever and unexpected weight change.  Respiratory:  Negative for cough, shortness of breath, wheezing and stridor.   Cardiovascular:  Negative for chest pain, palpitations and leg swelling.  Gastrointestinal:  Negative for abdominal distention, abdominal pain, constipation and diarrhea.  Genitourinary:  Positive for difficulty urinating. Negative for dysuria and hematuria.  Musculoskeletal:  Negative for arthralgias, back pain, gait problem, joint swelling and myalgias.  Neurological:  Positive for dizziness. Negative for seizures, syncope, facial asymmetry, speech difficulty, weakness and headaches.  Hematological:  Negative for adenopathy. Does not bruise/bleed easily.  Psychiatric/Behavioral:  Negative for  agitation, behavioral problems and confusion.     Immunization History  Administered Date(s) Administered  . Fluad Trivalent(High Dose 65+) 11/16/2022  . INFLUENZA, HIGH DOSE SEASONAL PF 11/23/2015, 11/27/2016, 11/27/2017, 11/14/2018  . Influenza Split 11/13/2012  . Influenza Whole 11/13/2009, 11/14/2011  . Influenza-Unspecified 12/08/2013, 12/07/2014  . PFIZER Comirnaty(Gray Top)Covid-19 Tri-Sucrose Vaccine 07/05/2020  . PFIZER(Purple Top)SARS-COV-2 Vaccination 03/02/2019, 03/19/2019, 10/23/2019  . Pneumococcal Conjugate-13  03/05/2017  . Pneumococcal Polysaccharide-23 02/04/2016  . Zoster, Live 03/14/2006   Pertinent  Health Maintenance Due  Topic Date Due  . Influenza Vaccine  09/14/2023      12/28/2021    3:29 PM 12/30/2021    1:30 PM 02/24/2022    1:00 PM 08/30/2023    2:34 PM 10/16/2023    3:10 PM  Fall Risk  Falls in the past year? 0   0 0  Was there an injury with Fall? 0   0 0  Fall Risk Category Calculator 0   0 0  Fall Risk Category (Retired) Low       (RETIRED) Patient Fall Risk Level Low fall risk  Low fall risk  High fall risk     Patient at Risk for Falls Due to No Fall Risks   No Fall Risks Impaired balance/gait  Fall risk Follow up Falls prevention discussed    Falls evaluation completed Falls evaluation completed;Education provided     Data saved with a previous flowsheet row definition   Functional Status Survey:    Vitals:   10/18/23 1143  BP: 130/81  Pulse: 74  Resp: 15  Temp: 98.4 F (36.9 C)  SpO2: 92%  Weight: 177 lb 3.2 oz (80.4 kg)  Height: 6' 3 (1.905 m)   Body mass index is 22.15 kg/m. Physical Exam Vitals reviewed.  Constitutional:      Appearance: Normal appearance.  Cardiovascular:     Rate and Rhythm: Normal rate and regular rhythm.  Pulmonary:     Effort: Pulmonary effort is normal.     Breath sounds: Normal breath sounds.  Abdominal:     General: Abdomen is flat. Bowel sounds are normal. There is no distension.     Palpations: Abdomen is soft.  Genitourinary:    Comments: Cath with yellow urine Musculoskeletal:     Right lower leg: No edema.     Left lower leg: No edema.  Neurological:     Mental Status: He is alert and oriented to person, place, and time. Mental status is at baseline.    Labs reviewed: Recent Labs    09/06/23 1216 09/26/23 1149 10/01/23 1512  NA 137 135 137  K 4.9 4.7 4.9  CL 102 101 101  CO2 28 26 27   GLUCOSE 83 89 100*  BUN 13 23 14   CREATININE 1.03 1.82* 1.19  CALCIUM 8.7* 8.7* 8.6*   Recent Labs     09/06/23 1216 09/26/23 1149 10/01/23 1512  AST 22 20 22   ALT 13 15 14   ALKPHOS 132* 143* 154*  BILITOT 0.6 1.1 0.5  PROT 6.8 7.8 7.0  ALBUMIN 3.5 3.5 3.5   Recent Labs    08/31/23 1502 09/06/23 1216 09/26/23 1149 10/01/23 1512  WBC 7.0 5.5 7.4 8.3  NEUTROABS 4.4 3.2  --  5.6  HGB 11.8* 11.2* 13.1 12.7*  HCT 37.2* 35.9* 42.5 40.3  MCV 89.4 90.2 91.2 91.0  PLT 282 237 253 274   Lab Results  Component Value Date   TSH 0.676 10/01/2023   No results found for: HGBA1C  Lab Results  Component Value Date   CHOL 141 03/14/2019   HDL 38.00 (L) 03/14/2019   LDLCALC 88 03/14/2019   LDLDIRECT 116.0 03/31/2013   TRIG 74.0 03/14/2019   CHOLHDL 4 03/14/2019    Significant Diagnostic Results in last 30 days:  CT ABDOMEN PELVIS WO CONTRAST Result Date: 09/26/2023 CLINICAL DATA:  Abdominal pain, dysuria, history of melanoma, history of thyroid  cancer, pulmonary metastasis. EXAM: CT ABDOMEN AND PELVIS WITHOUT CONTRAST TECHNIQUE: Multidetector CT imaging of the abdomen and pelvis was performed following the standard protocol without IV contrast. RADIATION DOSE REDUCTION: This exam was performed according to the departmental dose-optimization program which includes automated exposure control, adjustment of the mA and/or kV according to patient size and/or use of iterative reconstruction technique. COMPARISON:  Chest CT September 06, 2023 FINDINGS: Evaluation is suboptimal given the lack of contrast. Lower chest: Bilateral pulmonary nodules and masses up to 5.7 cm, stable to recent chest CT and consistent with known metastasis. Some of the lesions demonstrate punctate calcifications. Hepatobiliary: Scattered liver lesions likely representing cysts, stable to prior measuring up to 3.7 cm. Normal gallbladder. Pancreas: Atrophic changes of the pancreas. Spleen: Normal in size without focal abnormality. Adrenals/Urinary Tract: Bilateral simple renal cortical cysts measuring up to 12.6 cm which does not  require imaging follow-up, gradually enlarging to prior exams from 2022 and stable to recent chest CT. Right kidney interpolar nonobstructing nephrolithiasis measuring 4 mm. No hydronephrosis on the right. Additional punctate nephrolithiasis measuring 2 mm in right kidney interpolar region. There is mild hydroureteronephrosis on the left without definite ureteral stone.Bladder is under distended and thick wall containing Foley catheter. Questionable mucosal lesion at the left UVJ incompletely assessed on current noncontrast CT. (Image 8/64, 2/76). There is a focus of air within the bladder lumen likely due to recent catheterization. Stomach/Bowel: Stomach and bowel loops appear unremarkable. No bowel obstruction. Normal appendix. Vascular/Lymphatic: Severe atherosclerotic calcifications of aorta and branches. Numerous para-aortic and aortocaval subcentimeter and pericentimeter lymph nodes measuring up to 1 cm in short axis. Reproductive: Prostatomegaly protruding into bladder base. Bilateral hydroceles. Other: Small fat containing bilateral fat containing inguinal hernias. No significant ascites. Evaluation Musculoskeletal: Fat attenuation right pectineus muscle lesion likely lipoma measuring 3.3 x 5.5 cm. Right gluteal muscle lipoma measuring 2.8 x 3.9 cm. Left femoral neck sclerotic lesion likely bone island. IMPRESSION: Left-sided hydroureteronephrosis with suggestion of a mucosal lesion along the left bladder wall at the UVJ. Evaluation is suboptimal given the inadequate distension of the bladder and lack of contrast . Recommend further assessment with cystoscopy. Nonobstructive nephrolithiasis of the right kidney. Numerous hepatic and liver cysts are stable to prior chest CT and enlarged to prior CT abdomen from 2022. Multiple pulmonary nodules and masses consistent with known metastasis, grossly similar to recent chest CT. Multiple bilateral subcentimeter and pericentimeter retroperitoneal lymph nodes,  reactive versus metastatic. Severe prostatomegaly. Additional ancillary findings as above. Electronically Signed   By: Megan  Zare M.D.   On: 09/26/2023 20:18    Assessment/Plan  Orthostatic hypotension Orthostatic hypotension with dizziness during physical therapy. Blood pressure dropped from 130/80 mmHg sitting to 90/60 mmHg standing. Contributing factors include inadequate fluid intake, insufficient food consumption, and Rapaflo  use. - Instruct to rise slowly from sitting to standing. - Encourage increased fluid intake. - Encourage nutritional supplement if not eating enough. - Check BMP and CBC in the morning.  Urinary retention with indwelling Foley catheter Urinary retention managed with indwelling Foley catheter. Persistent retention necessitated Foley reinsertion. Rapaflo  may contribute to  hypotension. - Continue Foley catheter management. - Evaluate need for Rapaflo  if hypotension persists.  Possible prostate cancer (under evaluation) Under evaluation for possible prostate cancer. CT scan and further workup planned per urology. Doctor Watt advised ordering testosterone and PSA with next labs. - Order testosterone and PSA with next labs. - Follow up with urology for further evaluation and management.  Pulmonary embolism Pulmonary embolism with INR of 1.9 on September 3rd. - Coordinate with Coumadin  clinic for anticoagulation management.  CBC BMP PSA testosterone in am

## 2023-10-18 NOTE — Progress Notes (Signed)
 Received return call from RD at [facility].  She reports patient has been offered ONS (both Ensure and Boost products available) he has declines at this time.  He relayed that he preferred to try to eat more real food.  He has been given alternative choices at the SNF to order if the menu doesn't appeal to him.  She provided her email for future updates as [email or web address]  Micheline Craven, RDN, LDN Registered Dietitian, [facility] Part Time Remote (Usual office hours: Tuesday-Thursday) Mobile: [phone] Remote Office: [phone]

## 2023-10-18 NOTE — Telephone Encounter (Signed)
 Patient screened on MST.  Third and final attempt to reach patient by telephone. Have left messages with return number. Please consult RD for future needs.     Also reached out to [place] at [facility] who did relay that there is a RD working with him at the rehab.  She transferred me to her voicemail Georgia Berth) [facility].  Micheline Craven, RDN, LDN Registered Dietitian, [facility] Part Time Remote (Usual office hours: Tuesday-Thursday) Cell: [phone]

## 2023-10-19 ENCOUNTER — Encounter: Payer: Self-pay | Admitting: Adult Health

## 2023-10-19 ENCOUNTER — Telehealth: Payer: Self-pay | Admitting: Radiology

## 2023-10-19 DIAGNOSIS — R2689 Other abnormalities of gait and mobility: Secondary | ICD-10-CM | POA: Diagnosis not present

## 2023-10-19 DIAGNOSIS — R278 Other lack of coordination: Secondary | ICD-10-CM | POA: Diagnosis not present

## 2023-10-19 DIAGNOSIS — N179 Acute kidney failure, unspecified: Secondary | ICD-10-CM | POA: Diagnosis not present

## 2023-10-19 DIAGNOSIS — E291 Testicular hypofunction: Secondary | ICD-10-CM | POA: Diagnosis not present

## 2023-10-19 DIAGNOSIS — C61 Malignant neoplasm of prostate: Secondary | ICD-10-CM | POA: Diagnosis not present

## 2023-10-19 DIAGNOSIS — I951 Orthostatic hypotension: Secondary | ICD-10-CM | POA: Diagnosis not present

## 2023-10-19 LAB — COMPREHENSIVE METABOLIC PANEL WITH GFR
Albumin: 2.8 — AB (ref 3.5–5.0)
Calcium: 8.3 — AB (ref 8.7–10.7)
Globulin: 2.9
eGFR: 72

## 2023-10-19 LAB — CBC AND DIFFERENTIAL
HCT: 36 — AB (ref 41–53)
Hemoglobin: 11.8 — AB (ref 13.5–17.5)
Platelets: 250 K/uL (ref 150–400)
WBC: 5.5

## 2023-10-19 LAB — BASIC METABOLIC PANEL WITH GFR
BUN: 19 (ref 4–21)
CO2: 28 — AB (ref 13–22)
Chloride: 101 (ref 99–108)
Creatinine: 1 (ref 0.6–1.3)
Glucose: 87
Potassium: 4.4 meq/L (ref 3.5–5.1)
Sodium: 137 (ref 137–147)

## 2023-10-19 LAB — HEPATIC FUNCTION PANEL
ALT: 20 U/L (ref 10–40)
AST: 20 (ref 14–40)
Alkaline Phosphatase: 183 — AB (ref 25–125)
Bilirubin, Total: 0.4

## 2023-10-19 LAB — CBC: RBC: 4.1 (ref 3.87–5.11)

## 2023-10-19 NOTE — Telephone Encounter (Signed)
 Copied from CRM [id number]. Topic: Clinical - Medication Question >> Oct 19, 2023 12:34 PM Sasha M wrote: Reason for CRM: Cherie called from [facility] as a follow up to her previous questions. She states that pt is now at a skilled rehab unit and the requirements are different for his meds. She needs orders for coumadin , currently at 2.5 every Friday and 5mg  every other day. She is also questioning whether the INR dosage should continue at 1.91 or does that need adjustments. Call back number for Cherie is [phone]

## 2023-10-19 NOTE — Telephone Encounter (Signed)
 Is it the question about Coumadin  clinic appointments and INR checks? If yes, I am afraid Alm will have to continue coming to Coumadin  clinic appointments to manage his anticoagulation. Thanks

## 2023-10-22 ENCOUNTER — Encounter: Payer: Self-pay | Admitting: Internal Medicine

## 2023-10-22 ENCOUNTER — Non-Acute Institutional Stay (SKILLED_NURSING_FACILITY): Payer: Self-pay | Admitting: Internal Medicine

## 2023-10-22 ENCOUNTER — Telehealth: Payer: Self-pay

## 2023-10-22 ENCOUNTER — Telehealth: Payer: Self-pay | Admitting: Internal Medicine

## 2023-10-22 ENCOUNTER — Telehealth: Payer: Self-pay | Admitting: Hematology & Oncology

## 2023-10-22 DIAGNOSIS — I951 Orthostatic hypotension: Secondary | ICD-10-CM | POA: Diagnosis not present

## 2023-10-22 DIAGNOSIS — C73 Malignant neoplasm of thyroid gland: Secondary | ICD-10-CM | POA: Diagnosis not present

## 2023-10-22 DIAGNOSIS — E890 Postprocedural hypothyroidism: Secondary | ICD-10-CM

## 2023-10-22 DIAGNOSIS — R339 Retention of urine, unspecified: Secondary | ICD-10-CM | POA: Diagnosis not present

## 2023-10-22 DIAGNOSIS — R278 Other lack of coordination: Secondary | ICD-10-CM | POA: Diagnosis not present

## 2023-10-22 DIAGNOSIS — Z86718 Personal history of other venous thrombosis and embolism: Secondary | ICD-10-CM | POA: Diagnosis not present

## 2023-10-22 DIAGNOSIS — R531 Weakness: Secondary | ICD-10-CM

## 2023-10-22 DIAGNOSIS — R2689 Other abnormalities of gait and mobility: Secondary | ICD-10-CM | POA: Diagnosis not present

## 2023-10-22 DIAGNOSIS — N179 Acute kidney failure, unspecified: Secondary | ICD-10-CM | POA: Diagnosis not present

## 2023-10-22 DIAGNOSIS — I4819 Other persistent atrial fibrillation: Secondary | ICD-10-CM | POA: Diagnosis not present

## 2023-10-22 NOTE — Telephone Encounter (Signed)
 Copied from CRM [id number]. Topic: Clinical - Request for Lab/Test Order >> Oct 22, 2023  4:40 PM Chasity T wrote: Reason for CRM: [facility] is requesting updated orders for ptinr which are critical orders sent to them. Fax number is: [phone]  Call back number: [phone]

## 2023-10-22 NOTE — Telephone Encounter (Signed)
 Called pt to schedule Infusion per in[place]et. Pt did not wish to schedule at this time as he was living in a rehab facility at this time. He wishes to speak with Dr Timmy first. Appt scheduled for 9/18.

## 2023-10-22 NOTE — Progress Notes (Signed)
 Provider:  Charlanne Fredia CROME, MD  Location:  [facility] Room Number: 152 P Place of Service:  SNF (31)  PCP: Plotnikov, Karlynn GAILS, MD Patient Care Team: Garald Karlynn GAILS, MD as PCP - General (Internal Medicine) Rosan Credit, MD as Consulting Physician (Ophthalmology) Joshua Blamer, MD as Consulting Physician (Dermatology) Joshua Joesph Nett, MD as Referring Physician (Endocrinology) Timmy Maude SAUNDERS, MD as Medical Oncologist (Oncology) Szabat, Toribio BROCKS, [facility] (Inactive) (Pharmacist) Szabat, Toribio BROCKS, [facility] (Inactive) as Pharmacist (Pharmacist) Izell Domino, MD as Consulting Physician (Radiation Oncology) Malmfelt, Delon CROME, RN as Oncology Nurse Navigator Odella Refugio PARAS, MD as Consulting Physician (Ophthalmology) Plotnikov, Karlynn GAILS, MD as Consulting Physician (Internal Medicine) Pa, [facility] as Consulting Physician (Ophthalmology)  Extended Emergency Contact Information Primary Emergency Contact: Deroos,[name] Address: [address] [place], [place] Home Phone: [phone] Work Phone: [phone] Mobile Phone: [phone] Relation: Spouse  Code Status: DNR Goals of Care: Advanced Directive information    10/16/2023   11:41 AM  Advanced Directives  Does Patient Have a Medical Advance Directive? Yes  Type of Estate agent of [place];Living will  Does patient want to make changes to medical advance directive? No - Patient declined  Copy of Healthcare Power of Attorney in Chart? No - copy requested      Chief Complaint  Patient presents with   New Admit To SNF    HPI: Patient is a 89 y.o. 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  When he developed lower abdominal pain Went to ED where he was found to have Urinary Retention Foley catheter was placed. Creatinine elevated at 1.82 mg/dL at time of retention - CT abdomen showed left hydronephrosis, possible mucosal lesion on left bladder side, and right kidney nonobstructive nephrolithiasis Was also Started on Finasteride  Foley Removed after he underwent Cystoscopy  But then again had Urinary retention needing Foley catheter which was reinserted on 09/02  Then noticed to have Low Orthostatic Bps causing him to have dizziness Labs Done which were Normal Creat is Normal  Daughters in the room  Want his Silodosin  Stopped Also  want discontinue Hydrocodone He is not having any discomfort Some constipation He is needing minimal help with his ADLS But continues to be weak and gets Tired and SOB very easily Wants to know when he can go home  Feels Dizzy with Therapy and Prolong Walking     Past Medical History:  Diagnosis Date   Arthritis    Cancer (HCC) 2003   melanoma  L ear   GERD (gastroesophageal reflux disease)    Hypothyroidism    post op   Lung mass    Pulmonary embolism (HCC) 2005 & 2007   protein C deficiency   Past Surgical History:  Procedure Laterality Date   BRONCHIAL BRUSHINGS  07/15/2019   Procedure: BRONCHIAL BRUSHINGS;  Surgeon: Jude Harden GAILS, MD;  Location: [facility] ENDOSCOPY;  Service: Cardiopulmonary;;   BRONCHIAL WASHINGS  07/15/2019   Procedure: BRONCHIAL WASHINGS;  Surgeon: Jude Harden GAILS, MD;  Location: [facility] ENDOSCOPY;  Service: Cardiopulmonary;;   CARDIAC CATHETERIZATION     > 20 years ago  everything was  okay    CERVICAL FUSION      X 2; Dr Unice   COLONOSCOPY     ENDOBRONCHIAL ULTRASOUND N/A 07/15/2019   Procedure: ENDOBRONCHIAL ULTRASOUND;  Surgeon: Jude Harden GAILS, MD;  Location: [facility] ENDOSCOPY;  Service: Cardiopulmonary;  Laterality: N/A;   FINE NEEDLE ASPIRATION  07/15/2019   Procedure: FINE NEEDLE  ASPIRATION (FNA) LINEAR;  Surgeon: Jude Harden GAILS, MD;  Location: [facility] ENDOSCOPY;  Service: Cardiopulmonary;;   FINGER ARTHROPLASTY Right 04/17/2013   Procedure: IMPLANT ARTHROPLASTY RIGHT INDEX;  Surgeon: Lamar GAILS Leonor Mickey., MD;  Location: [facility];  Service: Orthopedics;  Laterality: Right;   LUMBAR LAMINECTOMY  2010   Dr Canary   MELANOMA EXCISION  2003   L ear   THYROIDECTOMY  1971   cytology indefinite   TONSILLECTOMY     TOTAL KNEE ARTHROPLASTY  2004   left   VIDEO BRONCHOSCOPY N/A 07/15/2019   Procedure: VIDEO BRONCHOSCOPY WITHOUT FLUORO;  Surgeon: Jude Harden GAILS, MD;  Location: [facility] ENDOSCOPY;  Service: Cardiopulmonary;  Laterality: N/A;   WISDOM TOOTH EXTRACTION      reports that he quit smoking about 30 years ago. His smoking use included cigarettes. He started smoking about 71 years ago. He has a 30.8 pack-year smoking history. He has never used smokeless tobacco. He reports current alcohol  use. He reports that he does not use drugs. Social History   Socioeconomic History   Marital status: Married    Spouse name: Not on file   Number of children: 4   Years of education: 15   Highest education level: Not on file  Occupational History   Not on file  Tobacco Use   Smoking status: Former    Current packs/day: 0.00    Average packs/day: 0.8 packs/day for 41.0 years (30.8 ttl pk-yrs)    Types: Cigarettes    Start date: 02/14/1952    Quit date: 02/13/1993    Years since quitting: 30.7   Smokeless tobacco: Never   Tobacco comments:    smoked 1956-1995, up to 1/2 ppd  Vaping Use   Vaping status: Never Used  Substance and Sexual Activity   Alcohol  use: Yes    Comment:  Vodka 2 oz/ night   Drug use: No   Sexual activity: Not Currently  Other Topics Concern   Not on file  Social History Narrative   Fun: Plays golf   Denies abuse and feels safe at home   Social Drivers of Health   Financial Resource Strain: Low Risk  (12/28/2021)   Overall Financial Resource  Strain (CARDIA)    Difficulty of Paying Living Expenses: Not hard at all  Food Insecurity: No Food Insecurity (12/28/2021)   Hunger Vital Sign    Worried About Running Out of Food in the Last Year: Never true    Ran Out of Food in the Last Year: Never true  Transportation Needs: No Transportation Needs (12/28/2021)   PRAPARE - Administrator, Civil Service (Medical): No    Lack of Transportation (Non-Medical): No  Physical Activity: Inactive (12/28/2021)   Exercise Vital Sign    Days of Exercise per Week: 0 days    Minutes of Exercise per Session: 0 min  Stress: No Stress Concern Present (12/28/2021)   [employer] of Occupational Health - Occupational Stress Questionnaire    Feeling of Stress : Not at all  Social Connections: Socially Integrated (12/28/2021)   Social Connection and Isolation Panel    Frequency of Communication  with Friends and Family: More than three times a week    Frequency of Social Gatherings with Friends and Family: More than three times a week    Attends Religious Services: More than 4 times per year    Active Member of Clubs or Organizations: Yes    Attends Banker Meetings: More than 4 times per year    Marital Status: Married  Catering manager Violence: Not At Risk (12/28/2021)   Humiliation, Afraid, Rape, and Kick questionnaire    Fear of Current or Ex-Partner: No    Emotionally Abused: No    Physically Abused: No    Sexually Abused: No    Functional Status Survey:    Family History  Problem Relation Age of Onset   Breast cancer Mother    Alzheimer's disease Father    Breast cancer Maternal Grandmother    Breast cancer Sister    Cancer Sister    Diabetes Neg Hx    Stroke Neg Hx    Heart disease Neg Hx     Health Maintenance  Topic Date Due   DTaP/Tdap/Td (1 - Tdap) Never done   Zoster Vaccines- Shingrix (1 of 2) 09/22/1949   Medicare Annual Wellness (AWV)  12/29/2022   Influenza Vaccine  09/14/2023    COVID-19 Vaccine (5 - 2025-26 season) 10/15/2023   Pneumococcal Vaccine: 50+ Years  Completed   HPV VACCINES  Aged Out   Meningococcal B Vaccine  Aged Out    No Known Allergies  Outpatient Encounter Medications as of 10/22/2023  Medication Sig   HYDROcodone-acetaminophen  (NORCO/VICODIN) 5-325 MG tablet Take 1 tablet by mouth every 6 (six) hours as needed for moderate pain (pain score 4-6).   Ibuprofen-diphenhydrAMINE HCl 200-25 MG CAPS Take 1 capsule by mouth as needed.   levothyroxine  (SYNTHROID ) 200 MCG tablet Take 1 tablet (200 mcg total) by mouth daily with 25mcg   levothyroxine  (SYNTHROID ) 25 MCG tablet Take 1 tablet (25 mcg total) by mouth daily.   Magnesium Hydroxide (MILK OF MAGNESIA PO) Take 45 mLs by mouth as directed. Give 45 ml by mouth every 24 hours as needed for Constipation 45 cc po daily prn except with renal failure   polyethylene glycol (MIRALAX  / GLYCOLAX ) 17 g packet Take 17 g by mouth every morning.   silodosin  (RAPAFLO ) 4 MG CAPS capsule Take 4 mg by mouth 2 (two) times daily. Give 1 capsule by mouth two times a day for Urinary Retention   sodium phosphate  Pediatric (FLEET) 3.5-9.5 GM/59ML enema Place 1 enema rectally as directed. Insert 1 unit rectally every 24 hours as needed for Constipation Not to exceed 3 consecutive days   tuberculin (TUBERSOL) 5 UNIT/0.1ML injection Inject 0.1 mLs into the skin every evening.   Vibegron  (GEMTESA ) 75 MG TABS Take 75 mg by mouth daily.   warfarin (COUMADIN ) 2.5 MG tablet Take 2.5 mg by mouth every Friday.   warfarin (COUMADIN ) 5 MG tablet Take 5 mg by mouth as directed. Monday, Tuesday, Wednesday, Thursday, Saturday, and Sunday.   acetaminophen  (TYLENOL ) 650 MG CR tablet Take 650 mg by mouth every 4 (four) hours as needed for pain. (Patient not taking: Reported on 10/22/2023)   No facility-administered encounter medications on file as of 10/22/2023.    Review of Systems  Constitutional:  Positive for activity change. Negative for  appetite change and unexpected weight change.  HENT: Negative.    Respiratory:  Positive for shortness of breath. Negative for cough.   Cardiovascular:  Negative for leg swelling.  Gastrointestinal:  Positive for constipation.  Genitourinary:  Positive for difficulty urinating. Negative for frequency.  Musculoskeletal:  Positive for gait problem. Negative for arthralgias and myalgias.  Skin: Negative.  Negative for rash.  Neurological:  Positive for dizziness and weakness.  Psychiatric/Behavioral:  Negative for confusion and sleep disturbance.   All other systems reviewed and are negative.   Vitals:   10/22/23 1047  BP: 122/78  Pulse: 76  Resp: 14  Temp: 98.2 F (36.8 C)  SpO2: 99%  Weight: 177 lb 3.2 oz (80.4 kg)  Height: 6' 3 (1.905 m)   Body mass index is 22.15 kg/m. Physical Exam Vitals reviewed.  Constitutional:      Appearance: Normal appearance.  HENT:     Head: Normocephalic.     Nose: Nose normal.     Mouth/Throat:     Mouth: Mucous membranes are moist.     Pharynx: Oropharynx is clear.  Eyes:     Pupils: Pupils are equal, round, and reactive to light.  Cardiovascular:     Rate and Rhythm: Regular rhythm. Bradycardia present.     Pulses: Normal pulses.     Heart sounds: No murmur heard. Pulmonary:     Effort: Pulmonary effort is normal. No respiratory distress.     Breath sounds: No rales.     Comments: BS decreased  Abdominal:     General: Abdomen is flat. Bowel sounds are normal.     Palpations: Abdomen is soft.  Musculoskeletal:        General: No swelling.     Cervical back: Neck supple.  Skin:    General: Skin is warm.  Neurological:     General: No focal deficit present.     Mental Status: He is alert and oriented to person, place, and time.  Psychiatric:        Mood and Affect: Mood normal.        Thought Content: Thought content normal.     Labs reviewed: Basic Metabolic Panel: Recent Labs    09/06/23 1216 09/26/23 1149  10/01/23 1512 10/19/23 0000  NA 137 135 137 137  K 4.9 4.7 4.9 4.4  CL 102 101 101 101  CO2 28 26 27  28*  GLUCOSE 83 89 100*  --   BUN 13 23 14 19   CREATININE 1.03 1.82* 1.19 1.0  CALCIUM 8.7* 8.7* 8.6* 8.3*   Liver Function Tests: Recent Labs    09/06/23 1216 09/26/23 1149 10/01/23 1512 10/19/23 0000  AST 22 20 22 20   ALT 13 15 14 20   ALKPHOS 132* 143* 154* 183*  BILITOT 0.6 1.1 0.5  --   PROT 6.8 7.8 7.0  --   ALBUMIN 3.5 3.5 3.5 2.8*   Recent Labs    09/26/23 1149  LIPASE 35   No results for input(s): AMMONIA in the last 8760 hours. CBC: Recent Labs    08/31/23 1502 09/06/23 1216 09/26/23 1149 10/01/23 1512 10/19/23 0000  WBC 7.0 5.5 7.4 8.3 5.5  NEUTROABS 4.4 3.2  --  5.6  --   HGB 11.8* 11.2* 13.1 12.7* 11.8*  HCT 37.2* 35.9* 42.5 40.3 36*  MCV 89.4 90.2 91.2 91.0  --   PLT 282 237 253 274 250   Cardiac Enzymes: No results for input(s): CKTOTAL, CKMB, CKMBINDEX, TROPONINI in the last 8760 hours. BNP: Invalid input(s): POCBNP No results found for: HGBA1C Lab Results  Component Value Date   TSH 0.676 10/01/2023   Lab Results  Component Value Date  VITAMINB12 249 02/08/2023   No results found for: FOLATE Lab Results  Component Value Date   IRON 23 (L) 10/01/2023   TIBC 189 (L) 10/01/2023   FERRITIN 614 (H) 10/01/2023    Imaging and Procedures obtained prior to SNF admission: CT ABDOMEN PELVIS WO CONTRAST Result Date: 09/26/2023 CLINICAL DATA:  Abdominal pain, dysuria, history of melanoma, history of thyroid  cancer, pulmonary metastasis. EXAM: CT ABDOMEN AND PELVIS WITHOUT CONTRAST TECHNIQUE: Multidetector CT imaging of the abdomen and pelvis was performed following the standard protocol without IV contrast. RADIATION DOSE REDUCTION: This exam was performed according to the departmental dose-optimization program which includes automated exposure control, adjustment of the mA and/or kV according to patient size and/or use of  iterative reconstruction technique. COMPARISON:  Chest CT September 06, 2023 FINDINGS: Evaluation is suboptimal given the lack of contrast. Lower chest: Bilateral pulmonary nodules and masses up to 5.7 cm, stable to recent chest CT and consistent with known metastasis. Some of the lesions demonstrate punctate calcifications. Hepatobiliary: Scattered liver lesions likely representing cysts, stable to prior measuring up to 3.7 cm. Normal gallbladder. Pancreas: Atrophic changes of the pancreas. Spleen: Normal in size without focal abnormality. Adrenals/Urinary Tract: Bilateral simple renal cortical cysts measuring up to 12.6 cm which does not require imaging follow-up, gradually enlarging to prior exams from 2022 and stable to recent chest CT. Right kidney interpolar nonobstructing nephrolithiasis measuring 4 mm. No hydronephrosis on the right. Additional punctate nephrolithiasis measuring 2 mm in right kidney interpolar region. There is mild hydroureteronephrosis on the left without definite ureteral stone.Bladder is under distended and thick wall containing Foley catheter. Questionable mucosal lesion at the left UVJ incompletely assessed on current noncontrast CT. (Image 8/64, 2/76). There is a focus of air within the bladder lumen likely due to recent catheterization. Stomach/Bowel: Stomach and bowel loops appear unremarkable. No bowel obstruction. Normal appendix. Vascular/Lymphatic: Severe atherosclerotic calcifications of aorta and branches. Numerous para-aortic and aortocaval subcentimeter and pericentimeter lymph nodes measuring up to 1 cm in short axis. Reproductive: Prostatomegaly protruding into bladder base. Bilateral hydroceles. Other: Small fat containing bilateral fat containing inguinal hernias. No significant ascites. Evaluation Musculoskeletal: Fat attenuation right pectineus muscle lesion likely lipoma measuring 3.3 x 5.5 cm. Right gluteal muscle lipoma measuring 2.8 x 3.9 cm. Left femoral neck sclerotic  lesion likely bone island. IMPRESSION: Left-sided hydroureteronephrosis with suggestion of a mucosal lesion along the left bladder wall at the UVJ. Evaluation is suboptimal given the inadequate distension of the bladder and lack of contrast . Recommend further assessment with cystoscopy. Nonobstructive nephrolithiasis of the right kidney. Numerous hepatic and liver cysts are stable to prior chest CT and enlarged to prior CT abdomen from 2022. Multiple pulmonary nodules and masses consistent with known metastasis, grossly similar to recent chest CT. Multiple bilateral subcentimeter and pericentimeter retroperitoneal lymph nodes, reactive versus metastatic. Severe prostatomegaly. Additional ancillary findings as above. Electronically Signed   By: Megan  Zare M.D.   On: 09/26/2023 20:18    Assessment/Plan 1. Orthostatic hypotension (Primary) Discontinue Silodosin  Will follow after that  2. Urinary retention Has Foley Follow up with Dr Watt Next week in with PET scan  3. PULMONARY EMBOLISM, HX OF On Coumadin  INR today came back as 4.87 Hold Coumadin  today  He follows with Coumadin  Clinic and we will coordinate with them  4. Thyroid  cancer [facility]) Follows with Dr Timmy  5. Generalized weakness Working with therapy Discussed in detail with the daughter who wants to discuss the discharge planning.  Patient is needing  minimal help with his ADLS Patient will continue to work with therapy by next week he feels like he can go home with home care.  He is comfortable with taking care of the Foley catheter 6. Postoperative hypothyroidism TSH Normal in 8/25    Family/ staff Communication:   Labs/tests ordered:

## 2023-10-22 NOTE — Telephone Encounter (Signed)
 Per Dr. Garald, patient needs appointment with Coumadin  clinic **see previous encounter from facility*

## 2023-10-22 NOTE — Telephone Encounter (Signed)
 Called Tabitha at [facility] and left VM stating per Dr Garald that pt should still attend coumadin  appt on 10/23/23.

## 2023-10-23 ENCOUNTER — Ambulatory Visit

## 2023-10-23 DIAGNOSIS — R2689 Other abnormalities of gait and mobility: Secondary | ICD-10-CM | POA: Diagnosis not present

## 2023-10-23 DIAGNOSIS — R278 Other lack of coordination: Secondary | ICD-10-CM | POA: Diagnosis not present

## 2023-10-23 DIAGNOSIS — N179 Acute kidney failure, unspecified: Secondary | ICD-10-CM | POA: Diagnosis not present

## 2023-10-23 NOTE — Telephone Encounter (Signed)
 Received call from pt's daughter, Jenelle, [phone]) who reports pt is in [facility] and the facility's NP is requesting we continue to manage pt's warfarin.   Nurse, Edsel, [phone]) got on the phone and advised last several INRs. 9/2 INR 2.4 (when pt entered their facility) 9/3 INR 1.9 9/8 INR 4.9  Current warfarin dosing in the facility has been 5 mg (1 tablet) daily except take 2.5 mg on Friday. This dosing is much higher than what pt was taking at last coumadin  clinic apt.   Danielle reports the lab INR usually does not result until the following day. They have a POCT INR machine but are not sure of its accuracy. They will contact coumadin  clinic with INR results when received.   Advised to hold warfarin today and hold warfarin tomorrow. Advised then to restart prior dosing before entering facility. Take 2.5 mg daily. They will recheck INR tomorrow. Provided Danielle direct number to coumadin  clinic. Provided pt's daughter direct number to coumadin  clinic.  Pt is not have any s/s of abnormal bruising or bleeding.

## 2023-10-24 DIAGNOSIS — N179 Acute kidney failure, unspecified: Secondary | ICD-10-CM | POA: Diagnosis not present

## 2023-10-24 DIAGNOSIS — Z7901 Long term (current) use of anticoagulants: Secondary | ICD-10-CM | POA: Diagnosis not present

## 2023-10-24 DIAGNOSIS — R2689 Other abnormalities of gait and mobility: Secondary | ICD-10-CM | POA: Diagnosis not present

## 2023-10-24 DIAGNOSIS — R278 Other lack of coordination: Secondary | ICD-10-CM | POA: Diagnosis not present

## 2023-10-25 ENCOUNTER — Ambulatory Visit (INDEPENDENT_AMBULATORY_CARE_PROVIDER_SITE_OTHER): Payer: Self-pay

## 2023-10-25 ENCOUNTER — Non-Acute Institutional Stay (SKILLED_NURSING_FACILITY): Payer: Self-pay | Admitting: Adult Health

## 2023-10-25 ENCOUNTER — Encounter: Payer: Self-pay | Admitting: Adult Health

## 2023-10-25 ENCOUNTER — Ambulatory Visit: Payer: Self-pay

## 2023-10-25 DIAGNOSIS — I951 Orthostatic hypotension: Secondary | ICD-10-CM

## 2023-10-25 DIAGNOSIS — J849 Interstitial pulmonary disease, unspecified: Secondary | ICD-10-CM | POA: Diagnosis not present

## 2023-10-25 DIAGNOSIS — R051 Acute cough: Secondary | ICD-10-CM

## 2023-10-25 DIAGNOSIS — R2689 Other abnormalities of gait and mobility: Secondary | ICD-10-CM | POA: Diagnosis not present

## 2023-10-25 DIAGNOSIS — R634 Abnormal weight loss: Secondary | ICD-10-CM | POA: Diagnosis not present

## 2023-10-25 DIAGNOSIS — N179 Acute kidney failure, unspecified: Secondary | ICD-10-CM | POA: Diagnosis not present

## 2023-10-25 DIAGNOSIS — Z7901 Long term (current) use of anticoagulants: Secondary | ICD-10-CM | POA: Diagnosis not present

## 2023-10-25 DIAGNOSIS — R278 Other lack of coordination: Secondary | ICD-10-CM | POA: Diagnosis not present

## 2023-10-25 LAB — POCT INR: INR: 3.3 — AB (ref 2.0–3.0)

## 2023-10-25 NOTE — Progress Notes (Signed)
 Call from 9/9:  Received call from pt's daughter, Jenelle, [phone]) who reports pt is in [facility] and the facility's NP is requesting we continue to manage pt's warfarin.  Nurse, Edsel, [phone]) got on the phone and advised last several INRs. 9/2 INR 2.4 (when pt entered their facility) 9/3 INR 1.9 9/8 INR 4.9 Current warfarin dosing in the facility has been 5 mg (1 tablet) daily except take 2.5 mg on Friday. This dosing is much higher than what pt was taking at last coumadin  clinic apt.  Danielle reports the lab INR usually does not result until the following day. They have a POCT INR machine but are not sure of its accuracy. They will contact coumadin  clinic with INR results when received.  Advised to hold warfarin today and hold warfarin tomorrow. Advised then to restart prior dosing before entering facility. Take 2.5 mg daily. They will recheck INR tomorrow. Provided Danielle direct number to coumadin  clinic. Provided pt's daughter direct number to coumadin  clinic. They have changed dosing tablet to 2.5 mg. Pt is not have any s/s of abnormal bruising or bleeding.   Received call today from Sherrie, RN at [facility], today reporting lab INR draw result from yesterday is 3.3. She is requesting warfarin dosing instructions.  Contacted Sheri and advised. She reports no s/s of bleeding or abnormal bruising. She verbalized understanding.  Hold warfarin today and then change weekly dose to take 2.5 mg daily except take 1.25 mg on Sunday. Recheck INR tomorrow. No warfarin tomorrow until INR result is received.

## 2023-10-25 NOTE — Telephone Encounter (Signed)
 FYI pt INR result

## 2023-10-25 NOTE — Patient Instructions (Addendum)
 Pre visit review using our clinic review tool, if applicable. No additional management support is needed unless otherwise documented below in the visit note.  Hold warfarin today and then change weekly dose to take 2.5 mg daily except take 1.25 mg on Sunday. Recheck INR tomorrow. No warfarin tomorrow until INR result is received.

## 2023-10-25 NOTE — Progress Notes (Signed)
 Location:  [facility] Room Number: 152-P Place of Service:  SNF (31) Provider:  Darlean Maus, NP  Patient Care Team: Garald Karlynn GAILS, MD as PCP - General (Internal Medicine) Rosan Credit, MD as Consulting Physician (Ophthalmology) Joshua Blamer, MD as Consulting Physician (Dermatology) Joshua Joesph Nett, MD as Referring Physician (Endocrinology) Timmy Maude SAUNDERS, MD as Medical Oncologist (Oncology) Szabat, Toribio BROCKS, [facility] (Inactive) (Pharmacist) Szabat, Toribio BROCKS, [facility] (Inactive) as Pharmacist (Pharmacist) Izell Domino, MD as Consulting Physician (Radiation Oncology) Malmfelt, Delon CROME, RN as Oncology Nurse Navigator Odella Refugio PARAS, MD as Consulting Physician (Ophthalmology) Plotnikov, Karlynn GAILS, MD as Consulting Physician (Internal Medicine) Pa, [facility] as Consulting Physician (Ophthalmology)  Extended Emergency Contact Information Primary Emergency Contact: Vacha,[name] Address: [address] [place], [place] Home Phone: [phone] Work Phone: [phone] Mobile Phone: [phone] Relation: Spouse  Code Status:  DNR Goals of care: Advanced Directive information    10/16/2023   11:41 AM  Advanced Directives  Does Patient Have a Medical Advance Directive? Yes  Type of Estate agent of [place];Living will  Does patient want to make changes to medical advance directive? No - Patient declined  Copy of Healthcare Power of Attorney in Chart? No - copy requested     Chief Complaint  Patient presents with   Cough    HPI:  Pt is a 89 y.o. male seen today for an acute visit for cough   He resides in skilled rehab due to weakness and urinary retention. He is followed by Dr Watt. 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  He was started on rapaflo  but it was stopped two days ago due to low bp. BMP done 10/19/23 due to low bp BUN 19 Cr 1.0 BP  drops when standing to 80's/50s with perceived lightheadedness  Also he has had a cough for a few days. He reports it is better this am. He does not have a fever. Sats WNL. No sore throat or bodyaches. Rapid flu and covid are negative.   He is encouraged to drink and eat well but has lost 10 lbs in the past 7 days. We are not sure of the accuracy of the weight and will reweigh in the morning.    Past Medical History:  Diagnosis Date   Arthritis    Cancer (HCC) 2003   melanoma  L ear   GERD (gastroesophageal reflux disease)    Hypothyroidism    post op   Lung mass    Pulmonary embolism (HCC) 2005 & 2007   protein C deficiency   Past Surgical History:  Procedure Laterality Date   BRONCHIAL BRUSHINGS  07/15/2019   Procedure: BRONCHIAL BRUSHINGS;  Surgeon: Jude Harden GAILS, MD;  Location: [facility] ENDOSCOPY;  Service: Cardiopulmonary;;   BRONCHIAL WASHINGS  07/15/2019   Procedure: BRONCHIAL WASHINGS;  Surgeon: Jude Harden GAILS, MD;  Location: [facility] ENDOSCOPY;  Service: Cardiopulmonary;;   CARDIAC CATHETERIZATION     > 20 years ago  everything was okay    CERVICAL FUSION      X 2; Dr Unice   COLONOSCOPY     ENDOBRONCHIAL ULTRASOUND N/A 07/15/2019   Procedure: ENDOBRONCHIAL ULTRASOUND;  Surgeon: Jude Harden GAILS, MD;  Location: [facility] ENDOSCOPY;  Service: Cardiopulmonary;  Laterality: N/A;   FINE NEEDLE ASPIRATION  07/15/2019   Procedure: FINE NEEDLE ASPIRATION (FNA) LINEAR;  Surgeon: Jude Harden GAILS, MD;  Location: [facility] ENDOSCOPY;  Service: Cardiopulmonary;;   FINGER ARTHROPLASTY Right 04/17/2013   Procedure: IMPLANT ARTHROPLASTY RIGHT INDEX;  Surgeon: Lamar LULLA Leonor Mickey., MD;  Location: [facility];  Service: Orthopedics;  Laterality: Right;   LUMBAR LAMINECTOMY  2010   Dr Canary   MELANOMA EXCISION  2003   L ear   THYROIDECTOMY  1971   cytology indefinite   TONSILLECTOMY     TOTAL KNEE ARTHROPLASTY  2004   left   VIDEO BRONCHOSCOPY N/A 07/15/2019   Procedure: VIDEO BRONCHOSCOPY WITHOUT  FLUORO;  Surgeon: Jude Harden LULLA, MD;  Location[facility] END[facility]COPY;  Service: Cardiopulmonary;  Laterality: N/A;   WISDOM TOOTH EXTRACTION      No Known Allergies  Outpatient Encounter Medications as of 10/25/2023  Medication Sig   acetaminophen  (TYLENOL ) 650 MG CR tablet Take 650 mg by mouth every 4 (four) hours as needed for pain.   guaiFENesin  (ROBITUSSIN) 100 MG/5ML liquid Take 10 mLs by mouth every 6 (six) hours as needed for cough or to loosen phlegm.   Ibuprofen-diphenhydrAMINE HCl 200-25 MG CAPS Take 1 capsule by mouth as needed.   levothyroxine  (SYNTHROID ) 200 MCG tablet Take 1 tablet (200 mcg total) by mouth daily with 25mcg   levothyroxine  (SYNTHROID ) 25 MCG tablet Take 1 tablet (25 mcg total) by mouth daily.   Magnesium Hydroxide (MILK OF MAGNESIA PO) Take 45 mLs by mouth as directed. Give 45 ml by mouth every 24 hours as needed for Constipation 45 cc po daily prn except with renal failure   polyethylene glycol (MIRALAX  / GLYCOLAX ) 17 g packet Take 17 g by mouth every morning.   sodium phosphate  Pediatric (FLEET) 3.5-9.5 GM/59ML enema Place 1 enema rectally as directed. Insert 1 unit rectally every 24 hours as needed for Constipation Not to exceed 3 consecutive days   tuberculin (TUBERSOL) 5 UNIT/0.1ML injection Inject 0.1 mLs into the skin every evening.   Vibegron  (GEMTESA ) 75 MG TABS Take 75 mg by mouth daily.   warfarin (COUMADIN ) 2.5 MG tablet Take 2.5 mg by mouth daily at 6 (six) AM.   HYDROcodone-acetaminophen  (NORCO/VICODIN) 5-325 MG tablet Take 1 tablet by mouth every 6 (six) hours as needed for moderate pain (pain score 4-6). (Patient not taking: Reported on 10/25/2023)   silodosin  (RAPAFLO ) 4 MG CAPS capsule Take 4 mg by mouth 2 (two) times daily. Give 1 capsule by mouth two times a day for Urinary Retention (Patient not taking: Reported on 10/25/2023)   warfarin (COUMADIN ) 5 MG tablet Take 5 mg by mouth as directed. Monday, Tuesday, Wednesday, Thursday, Saturday, and Sunday.  (Patient not taking: Reported on 10/25/2023)   No facility-administered encounter medications on file as of 10/25/2023.    Review of Systems  Constitutional:  Positive for appetite change and unexpected weight change. Negative for activity change, chills, diaphoresis, fatigue and fever.  HENT:  Negative for congestion, postnasal drip, rhinorrhea, sinus pressure, sinus pain and sneezing.   Respiratory:  Positive for cough. Negative for shortness of breath and wheezing.   Cardiovascular:  Negative for chest pain and leg swelling.  Gastrointestinal:  Negative for abdominal distention, abdominal pain, constipation, diarrhea, nausea and vomiting.  Genitourinary:  Positive for difficulty urinating (resolved with foley). Negative for dysuria and flank pain.  Musculoskeletal:  Positive for gait problem. Negative for back pain, myalgias and neck pain.  Skin:  Negative for rash.  Neurological:  Positive for weakness (general). Negative for dizziness.  Psychiatric/Behavioral:  Negative for confusion.     Immunization History  Administered Date(s)  Administered   Fluad Trivalent(High Dose 65+) 11/16/2022   INFLUENZA, HIGH DOSE SEASONAL PF 11/23/2015, 11/27/2016, 11/27/2017, 11/14/2018   Influenza Split 11/13/2012   Influenza Whole 11/13/2009, 11/14/2011   Influenza-Unspecified 12/08/2013, 12/07/2014   PFIZER Comirnaty(Gray Top)Covid-19 Tri-Sucrose Vaccine 07/05/2020   PFIZER(Purple Top)SARS-COV-2 Vaccination 03/02/2019, 03/19/2019, 10/23/2019   Pneumococcal Conjugate-13 03/05/2017   Pneumococcal Polysaccharide-23 02/04/2016   RSV,unspecified 11/16/2022   Zoster, Live 03/14/2006   Pertinent  Health Maintenance Due  Topic Date Due   Influenza Vaccine  09/14/2023      12/28/2021    3:29 PM 12/30/2021    1:30 PM 02/24/2022    1:00 PM 08/30/2023    2:34 PM 10/16/2023    3:10 PM  Fall Risk  Falls in the past year? 0   0 0  Was there an injury with Fall? 0   0 0  Fall Risk Category Calculator 0    0 0  Fall Risk Category (Retired) Low       (RETIRED) Patient Fall Risk Level Low fall risk  Low fall risk  High fall risk     Patient at Risk for Falls Due to No Fall Risks   No Fall Risks Impaired balance/gait  Fall risk Follow up Falls prevention discussed    Falls evaluation completed Falls evaluation completed;Education provided     Data saved with a previous flowsheet row definition   Functional Status Survey:    Vitals:   10/25/23 0953  BP: 111/72  Pulse: 81  Temp: 98 F (36.7 C)  SpO2: 92%  Weight: 167 lb 3.2 oz (75.8 kg)  Height: 6' 3 (1.905 m)   Body mass index is 20.9 kg/m. Physical Exam Vitals and nursing note reviewed.  HENT:     Head: Normocephalic and atraumatic.     Nose: Nose normal.     Mouth/Throat:     Mouth: Mucous membranes are moist.     Pharynx: Oropharynx is clear.  Eyes:     Conjunctiva/sclera: Conjunctivae normal.     Pupils: Pupils are equal, round, and reactive to light.  Cardiovascular:     Rate and Rhythm: Normal rate and regular rhythm.     Heart sounds: No murmur heard. Pulmonary:     Breath sounds: Rales (faint bases, decreased.) present.  Abdominal:     General: Bowel sounds are normal. There is no distension.     Palpations: Abdomen is soft.     Tenderness: There is no abdominal tenderness.  Musculoskeletal:     Right lower leg: No edema.     Left lower leg: No edema.  Skin:    General: Skin is warm and dry.  Neurological:     General: No focal deficit present.     Mental Status: Mental status is at baseline.  Psychiatric:        Mood and Affect: Mood normal.     Labs reviewed: Recent Labs    09/06/23 1216 09/26/23 1149 10/01/23 1512 10/19/23 0000  NA 137 135 137 137  K 4.9 4.7 4.9 4.4  CL 102 101 101 101  CO2 28 26 27  28*  GLUCOSE 83 89 100*  --   BUN 13 23 14 19   CREATININE 1.03 1.82* 1.19 1.0  CALCIUM 8.7* 8.7* 8.6* 8.3*   Recent Labs    09/06/23 1216 09/26/23 1149 10/01/23 1512 10/19/23 0000  AST 22  20 22 20   ALT 13 15 14 20   ALKPHOS 132* 143* 154* 183*  BILITOT 0.6 1.1 0.5  --  PROT 6.8 7.8 7.0  --   ALBUMIN 3.5 3.5 3.5 2.8*   Recent Labs    08/31/23 1502 09/06/23 1216 09/26/23 1149 10/01/23 1512 10/19/23 0000  WBC 7.0 5.5 7.4 8.3 5.5  NEUTROABS 4.4 3.2  --  5.6  --   HGB 11.8* 11.2* 13.1 12.7* 11.8*  HCT 37.2* 35.9* 42.5 40.3 36*  MCV 89.4 90.2 91.2 91.0  --   PLT 282 237 253 274 250   Lab Results  Component Value Date   TSH 0.676 10/01/2023   No results found for: HGBA1C Lab Results  Component Value Date   CHOL 141 03/14/2019   HDL 38.00 (L) 03/14/2019   LDLCALC 88 03/14/2019   LDLDIRECT 116.0 03/31/2013   TRIG 74.0 03/14/2019   CHOLHDL 4 03/14/2019    Significant Diagnostic Results in last 30 days:  CT ABDOMEN PELVIS WO CONTRAST Result Date: 09/26/2023 CLINICAL DATA:  Abdominal pain, dysuria, history of melanoma, history of thyroid  cancer, pulmonary metastasis. EXAM: CT ABDOMEN AND PELVIS WITHOUT CONTRAST TECHNIQUE: Multidetector CT imaging of the abdomen and pelvis was performed following the standard protocol without IV contrast. RADIATION DOSE REDUCTION: This exam was performed according to the departmental dose-optimization program which includes automated exposure control, adjustment of the mA and/or kV according to patient size and/or use of iterative reconstruction technique. COMPARISON:  Chest CT September 06, 2023 FINDINGS: Evaluation is suboptimal given the lack of contrast. Lower chest: Bilateral pulmonary nodules and masses up to 5.7 cm, stable to recent chest CT and consistent with known metastasis. Some of the lesions demonstrate punctate calcifications. Hepatobiliary: Scattered liver lesions likely representing cysts, stable to prior measuring up to 3.7 cm. Normal gallbladder. Pancreas: Atrophic changes of the pancreas. Spleen: Normal in size without focal abnormality. Adrenals/Urinary Tract: Bilateral simple renal cortical cysts measuring up to 12.6 cm  which does not require imaging follow-up, gradually enlarging to prior exams from 2022 and stable to recent chest CT. Right kidney interpolar nonobstructing nephrolithiasis measuring 4 mm. No hydronephrosis on the right. Additional punctate nephrolithiasis measuring 2 mm in right kidney interpolar region. There is mild hydroureteronephrosis on the left without definite ureteral stone.Bladder is under distended and thick wall containing Foley catheter. Questionable mucosal lesion at the left UVJ incompletely assessed on current noncontrast CT. (Image 8/64, 2/76). There is a focus of air within the bladder lumen likely due to recent catheterization. Stomach/Bowel: Stomach and bowel loops appear unremarkable. No bowel obstruction. Normal appendix. Vascular/Lymphatic: Severe atherosclerotic calcifications of aorta and branches. Numerous para-aortic and aortocaval subcentimeter and pericentimeter lymph nodes measuring up to 1 cm in short axis. Reproductive: Prostatomegaly protruding into bladder base. Bilateral hydroceles. Other: Small fat containing bilateral fat containing inguinal hernias. No significant ascites. Evaluation Musculoskeletal: Fat attenuation right pectineus muscle lesion likely lipoma measuring 3.3 x 5.5 cm. Right gluteal muscle lipoma measuring 2.8 x 3.9 cm. Left femoral neck sclerotic lesion likely bone island. IMPRESSION: Left-sided hydroureteronephrosis with suggestion of a mucosal lesion along the left bladder wall at the UVJ. Evaluation is suboptimal given the inadequate distension of the bladder and lack of contrast . Recommend further assessment with cystoscopy. Nonobstructive nephrolithiasis of the right kidney. Numerous hepatic and liver cysts are stable to prior chest CT and enlarged to prior CT abdomen from 2022. Multiple pulmonary nodules and masses consistent with known metastasis, grossly similar to recent chest CT. Multiple bilateral subcentimeter and pericentimeter retroperitoneal lymph  nodes, reactive versus metastatic. Severe prostatomegaly. Additional ancillary findings as above. Electronically Signed   By: Megan  Terri M.D.   On: 09/26/2023 20:18    Assessment/Plan  1. Acute cough (Primary) CXR Monitor vitals  2. Weight loss Discussed with nutritionist Check BMP in am TSH 0.676 Re weigh in am  3. Orthostatic hypotension Off rapaflo  for two days give one more day IVF 80cc /hr x 1 liter Recheck orthostatic vitals in am    Labs/tests ordered:  BMP INR in am

## 2023-10-25 NOTE — Telephone Encounter (Signed)
 Wrong office

## 2023-10-25 NOTE — Telephone Encounter (Signed)
 2nd attempt to contact on call provider, no answer. Left voicemail with office callback number.

## 2023-10-25 NOTE — Telephone Encounter (Addendum)
 The facility was contacted earlier this morning, and spoke with Tylene, RN and warfarin dosing instructions were given at that time. No further f/u needed.  This is not a critical INR result for this pt.

## 2023-10-25 NOTE — Telephone Encounter (Signed)
 This RN obtained critical labs, PT of 30.8 and INR of 3.34 . Best callback number is [phone]) at office for Nurse Danielle.       Copied from CRM #[id number]. Topic: Clinical - Red Word Triage >> Oct 25, 2023  2:45 PM [place] J wrote: [facility] that prompted transfer to Nurse Triage: Jeoffrey, a nurse with [facility] is needing to schedule an urgent appointment due to critical labs they received. She informed me that they are holding his coumadin  and his PT was 30.8 and IRN at 3.34.  Reason for Disposition  [1] Follow-up call to recent contact AND [2] information only call, no triage required  Answer Assessment - Initial Assessment Questions Spoke with Ginger, and Nurse Danielle from the [facility] regarding the patient. Labs stated to be a PT of 30.8 and INR of 3.34 . Best callback number is [phone]) at office for Nurse Edsel and she is requesting to speak with [facility]. She also stated they have started the patient on Normal Saline IV fluids due to him feeling weak and having a low BP. She also stated his INR was also high during the week. Still holding patient's coumadin .  This RN called on call provider, no answer and left voicemail with callback number. Will route to office for follow up.    1. REASON FOR CALL: What is the main reason for your call? or How can I best help you?     Abnormal Labs  2. SYMPTOMS : Do you have any symptoms?      Patient is feeling weak.  Protocols used: Information Only Call - No Triage-A-AH

## 2023-10-26 ENCOUNTER — Telehealth: Payer: Self-pay

## 2023-10-26 ENCOUNTER — Encounter: Payer: Self-pay | Admitting: Adult Health

## 2023-10-26 ENCOUNTER — Non-Acute Institutional Stay (SKILLED_NURSING_FACILITY): Payer: Self-pay | Admitting: Adult Health

## 2023-10-26 DIAGNOSIS — J40 Bronchitis, not specified as acute or chronic: Secondary | ICD-10-CM

## 2023-10-26 DIAGNOSIS — N179 Acute kidney failure, unspecified: Secondary | ICD-10-CM | POA: Diagnosis not present

## 2023-10-26 DIAGNOSIS — R2689 Other abnormalities of gait and mobility: Secondary | ICD-10-CM | POA: Diagnosis not present

## 2023-10-26 DIAGNOSIS — M79651 Pain in right thigh: Secondary | ICD-10-CM

## 2023-10-26 DIAGNOSIS — R278 Other lack of coordination: Secondary | ICD-10-CM | POA: Diagnosis not present

## 2023-10-26 DIAGNOSIS — Z7901 Long term (current) use of anticoagulants: Secondary | ICD-10-CM | POA: Diagnosis not present

## 2023-10-26 DIAGNOSIS — I951 Orthostatic hypotension: Secondary | ICD-10-CM

## 2023-10-26 MED ORDER — AMOXICILLIN-POT CLAVULANATE 875-125 MG PO TABS: 1.0000 | ORAL_TABLET | Freq: Two times a day (BID) | ORAL | Status: AC

## 2023-10-26 NOTE — Progress Notes (Signed)
 Location:  Medical illustrator of Service:  SNF (31) Provider:   Bari America, ANP [facility] [phone]   Plotnikov, Karlynn GAILS, MD  Patient Care Team: Garald Karlynn GAILS, MD as PCP - General (Internal Medicine) Rosan Credit, MD as Consulting Physician (Ophthalmology) Joshua Blamer, MD as Consulting Physician (Dermatology) Joshua Joesph Nett, MD as Referring Physician (Endocrinology) Timmy Maude SAUNDERS, MD as Medical Oncologist (Oncology) Szabat, Toribio BROCKS, [facility] (Inactive) (Pharmacist) Szabat, Toribio BROCKS, [facility] (Inactive) as Pharmacist (Pharmacist) Izell Domino, MD as Consulting Physician (Radiation Oncology) Malmfelt, Delon CROME, RN as Oncology Nurse Navigator Odella Refugio PARAS, MD as Consulting Physician (Ophthalmology) Plotnikov, Karlynn GAILS, MD as Consulting Physician (Internal Medicine) Pa, [facility] as Consulting Physician (Ophthalmology)  Extended Emergency Contact Information Primary Emergency Contact: [place],[name] Address: [address] [place], [place] Home Phone: [phone] Work Phone: [phone] Mobile Phone: [phone] Relation: Spouse  Code Status:  DNR Goals of care: Advanced Directive information    10/16/2023   11:41 AM  Advanced Directives  Does Patient Have a Medical Advance Directive? Yes  Type of Estate agent of [place];Living will  Does patient want to make changes to medical advance directive? No - Patient declined  Copy of Healthcare Power of Attorney in Chart? No - copy requested     Chief Complaint  Patient presents with   Acute Visit    Feels weak, right thigh pain    HPI:  Pt is a 89 y.o. male seen today for an acute visit for weakness  Nurse reports that his bp is still dropping when working with therapy with dizziness. Resolves when lying down Off rapaflo  Received 1 liter of IVF   He reports a cough, x 2  weeks No sob or fever. Feels weak. CXR 10/25/23 negative for acute infection  Currently with a foley due to BPH and urinary retention. Following up next week with urology for bone scan due to elevated PSA concern for prostate Ca  INR 3.3 10/25/23 coumadin  on hold per Dr Garald.   He reports thigh pain on the right x 2 days. No swelling. No falls. Has been working with therapy Past Medical History:  Diagnosis Date   Arthritis    Cancer (HCC) 2003   melanoma  L ear   GERD (gastroesophageal reflux disease)    Hypothyroidism    post op   Lung mass    Pulmonary embolism (HCC) 2005 & 2007   protein C deficiency   Past Surgical History:  Procedure Laterality Date   BRONCHIAL BRUSHINGS  07/15/2019   Procedure: BRONCHIAL BRUSHINGS;  Surgeon: Jude Harden GAILS, MD;  Location: [facility] ENDOSCOPY;  Service: Cardiopulmonary;;   BRONCHIAL WASHINGS  07/15/2019   Procedure: BRONCHIAL WASHINGS;  Surgeon: Jude Harden GAILS, MD;  Location: [facility] ENDOSCOPY;  Service: Cardiopulmonary;;   CARDIAC CATHETERIZATION     > 20 years ago  everything was okay    CERVICAL FUSION      X 2; Dr Unice   COLONOSCOPY     ENDOBRONCHIAL ULTRASOUND N/A 07/15/2019   Procedure: ENDOBRONCHIAL ULTRASOUND;  Surgeon: Jude Harden GAILS, MD;  Location[facility] ENDOSCOPY;  Service: Cardiopulmonary;  Laterality: N/A;   FINE NEEDLE ASPIRATION  07/15/2019   Procedure: FINE NEEDLE ASPIRATION (FNA) LINEAR;  Surgeon: Jude Harden GAILS, MD;  Location: [facility] ENDOSCOPY;  Service: Cardiopulmonary;;   FINGER ARTHROPLASTY Right 04/17/2013   Procedure: IMPLANT ARTHROPLASTY RIGHT INDEX;  Surgeon: Lamar LULLA Leonor Mickey., MD;  Location: [facility];  Service: Orthopedics;  Laterality: Right;   LUMBAR LAMINECTOMY  2010   Dr Canary   MELANOMA EXCISION  2003   L ear   THYROIDECTOMY  1971   cytology indefinite   TONSILLECTOMY     TOTAL KNEE ARTHROPLASTY  2004   left   VIDEO BRONCHOSCOPY N/A 07/15/2019   Procedure: VIDEO BRONCHOSCOPY WITHOUT FLUORO;  Surgeon:  Jude Harden LULLA, MD;  Location: [facility] ENDOSCOPY;  Service: Cardiopulmonary;  Laterality: N/A;   WISDOM TOOTH EXTRACTION      No Known Allergies  Outpatient Encounter Medications as of 10/26/2023  Medication Sig   amoxicillin -clavulanate (AUGMENTIN ) 875-125 MG tablet Take 1 tablet by mouth 2 (two) times daily for 7 days.   Ibuprofen-diphenhydrAMINE HCl 200-25 MG CAPS Take 1 capsule by mouth as needed.   levothyroxine  (SYNTHROID ) 200 MCG tablet Take 1 tablet (200 mcg total) by mouth daily with 25mcg   levothyroxine  (SYNTHROID ) 25 MCG tablet Take 1 tablet (25 mcg total) by mouth daily.   Magnesium Hydroxide (MILK OF MAGNESIA PO) Take 45 mLs by mouth as directed. Give 45 ml by mouth every 24 hours as needed for Constipation 45 cc po daily prn except with renal failure   polyethylene glycol (MIRALAX  / GLYCOLAX ) 17 g packet Take 17 g by mouth every morning.   sodium phosphate  Pediatric (FLEET) 3.5-9.5 GM/59ML enema Place 1 enema rectally as directed. Insert 1 unit rectally every 24 hours as needed for Constipation Not to exceed 3 consecutive days   tuberculin (TUBERSOL) 5 UNIT/0.1ML injection Inject 0.1 mLs into the skin every evening.   Vibegron  (GEMTESA ) 75 MG TABS Take 75 mg by mouth daily.   acetaminophen  (TYLENOL ) 650 MG CR tablet Take 650 mg by mouth every 4 (four) hours as needed for pain.   warfarin (COUMADIN ) 5 MG tablet Take 5 mg by mouth as directed. Monday, Tuesday, Wednesday, Thursday, Saturday, and Sunday. (Patient not taking: Reported on 10/26/2023)   [DISCONTINUED] guaiFENesin  (ROBITUSSIN) 100 MG/5ML liquid Take 10 mLs by mouth every 6 (six) hours as needed for cough or to loosen phlegm.   [DISCONTINUED] HYDROcodone-acetaminophen  (NORCO/VICODIN) 5-325 MG tablet Take 1 tablet by mouth every 6 (six) hours as needed for moderate pain (pain score 4-6). (Patient not taking: Reported on 10/25/2023)   [DISCONTINUED] silodosin  (RAPAFLO ) 4 MG CAPS capsule Take 4 mg by mouth 2 (two) times daily. Give  1 capsule by mouth two times a day for Urinary Retention (Patient not taking: Reported on 10/25/2023)   [DISCONTINUED] warfarin (COUMADIN ) 2.5 MG tablet Take 2.5 mg by mouth daily at 6 (six) AM.   No facility-administered encounter medications on file as of 10/26/2023.    Review of Systems  Constitutional:  Positive for activity change, appetite change, fatigue and unexpected weight change. Negative for chills, diaphoresis and fever.  HENT:  Positive for congestion. Negative for postnasal drip, rhinorrhea, sinus pressure and trouble swallowing.   Respiratory:  Positive for cough. Negative for shortness of breath and wheezing.   Gastrointestinal:  Negative for abdominal distention, blood in stool, constipation and diarrhea.  Genitourinary:  Positive for difficulty urinating (resolved with foley). Negative for decreased urine volume, dysuria, flank pain and frequency.  Musculoskeletal:  Positive for gait problem.       Thigh pain  Psychiatric/Behavioral:  Negative for agitation, behavioral problems and confusion.     Immunization History  Administered Date(s) Administered   Fluad Trivalent(High Dose 65+) 11/16/2022   INFLUENZA,  HIGH DOSE SEASONAL PF 11/23/2015, 11/27/2016, 11/27/2017, 11/14/2018   Influenza Split 11/13/2012   Influenza Whole 11/13/2009, 11/14/2011   Influenza-Unspecified 12/08/2013, 12/07/2014   PFIZER Comirnaty(Gray Top)Covid-19 Tri-Sucrose Vaccine 07/05/2020   PFIZER(Purple Top)SARS-COV-2 Vaccination 03/02/2019, 03/19/2019, 10/23/2019   Pneumococcal Conjugate-13 03/05/2017   Pneumococcal Polysaccharide-23 02/04/2016   RSV,unspecified 11/16/2022   Zoster, Live 03/14/2006   Pertinent  Health Maintenance Due  Topic Date Due   Influenza Vaccine  09/14/2023      12/28/2021    3:29 PM 12/30/2021    1:30 PM 02/24/2022    1:00 PM 08/30/2023    2:34 PM 10/16/2023    3:10 PM  Fall Risk  Falls in the past year? 0   0 0  Was there an injury with Fall? 0   0 0  Fall Risk  Category Calculator 0   0 0  Fall Risk Category (Retired) Low       (RETIRED) Patient Fall Risk Level Low fall risk  Low fall risk  High fall risk     Patient at Risk for Falls Due to No Fall Risks   No Fall Risks Impaired balance/gait  Fall risk Follow up Falls prevention discussed    Falls evaluation completed Falls evaluation completed;Education provided     Data saved with a previous flowsheet row definition   Functional Status Survey:    Vitals:   10/26/23 1249  BP: 125/74  Pulse: 74  Resp: 14  Temp: 98.4 F (36.9 C)  SpO2: 98%  Weight: 168 lb 3.2 oz (76.3 kg)   Body mass index is 21.02 kg/m. Physical Exam Vitals reviewed.  Constitutional:      Appearance: Normal appearance.  Cardiovascular:     Rate and Rhythm: Normal rate and regular rhythm.     Heart sounds: No murmur heard. Pulmonary:     Effort: Pulmonary effort is normal. No respiratory distress.     Breath sounds: Wheezing and rhonchi present.  Musculoskeletal:     Right lower leg: No edema.     Left lower leg: No edema.       Legs:  Skin:    General: Skin is warm and dry.  Neurological:     Mental Status: He is alert.     Labs reviewed: Recent Labs    09/06/23 1216 09/26/23 1149 10/01/23 1512 10/19/23 0000  NA 137 135 137 137  K 4.9 4.7 4.9 4.4  CL 102 101 101 101  CO2 28 26 27  28*  GLUCOSE 83 89 100*  --   BUN 13 23 14 19   CREATININE 1.03 1.82* 1.19 1.0  CALCIUM 8.7* 8.7* 8.6* 8.3*   Recent Labs    09/06/23 1216 09/26/23 1149 10/01/23 1512 10/19/23 0000  AST 22 20 22 20   ALT 13 15 14 20   ALKPHOS 132* 143* 154* 183*  BILITOT 0.6 1.1 0.5  --   PROT 6.8 7.8 7.0  --   ALBUMIN 3.5 3.5 3.5 2.8*   Recent Labs    08/31/23 1502 09/06/23 1216 09/26/23 1149 10/01/23 1512 10/19/23 0000  WBC 7.0 5.5 7.4 8.3 5.5  NEUTROABS 4.4 3.2  --  5.6  --   HGB 11.8* 11.2* 13.1 12.7* 11.8*  HCT 37.2* 35.9* 42.5 40.3 36*  MCV 89.4 90.2 91.2 91.0  --   PLT 282 237 253 274 250   Lab Results   Component Value Date   TSH 0.676 10/01/2023   No results found for: HGBA1C Lab Results  Component Value Date  CHOL 141 03/14/2019   HDL 38.00 (L) 03/14/2019   LDLCALC 88 03/14/2019   LDLDIRECT 116.0 03/31/2013   TRIG 74.0 03/14/2019   CHOLHDL 4 03/14/2019    Significant Diagnostic Results in last 30 days:  CT ABDOMEN PELVIS WO CONTRAST Result Date: 09/26/2023 CLINICAL DATA:  Abdominal pain, dysuria, history of melanoma, history of thyroid  cancer, pulmonary metastasis. EXAM: CT ABDOMEN AND PELVIS WITHOUT CONTRAST TECHNIQUE: Multidetector CT imaging of the abdomen and pelvis was performed following the standard protocol without IV contrast. RADIATION DOSE REDUCTION: This exam was performed according to the departmental dose-optimization program which includes automated exposure control, adjustment of the mA and/or kV according to patient size and/or use of iterative reconstruction technique. COMPARISON:  Chest CT September 06, 2023 FINDINGS: Evaluation is suboptimal given the lack of contrast. Lower chest: Bilateral pulmonary nodules and masses up to 5.7 cm, stable to recent chest CT and consistent with known metastasis. Some of the lesions demonstrate punctate calcifications. Hepatobiliary: Scattered liver lesions likely representing cysts, stable to prior measuring up to 3.7 cm. Normal gallbladder. Pancreas: Atrophic changes of the pancreas. Spleen: Normal in size without focal abnormality. Adrenals/Urinary Tract: Bilateral simple renal cortical cysts measuring up to 12.6 cm which does not require imaging follow-up, gradually enlarging to prior exams from 2022 and stable to recent chest CT. Right kidney interpolar nonobstructing nephrolithiasis measuring 4 mm. No hydronephrosis on the right. Additional punctate nephrolithiasis measuring 2 mm in right kidney interpolar region. There is mild hydroureteronephrosis on the left without definite ureteral stone.Bladder is under distended and thick wall  containing Foley catheter. Questionable mucosal lesion at the left UVJ incompletely assessed on current noncontrast CT. (Image 8/64, 2/76). There is a focus of air within the bladder lumen likely due to recent catheterization. Stomach/Bowel: Stomach and bowel loops appear unremarkable. No bowel obstruction. Normal appendix. Vascular/Lymphatic: Severe atherosclerotic calcifications of aorta and branches. Numerous para-aortic and aortocaval subcentimeter and pericentimeter lymph nodes measuring up to 1 cm in short axis. Reproductive: Prostatomegaly protruding into bladder base. Bilateral hydroceles. Other: Small fat containing bilateral fat containing inguinal hernias. No significant ascites. Evaluation Musculoskeletal: Fat attenuation right pectineus muscle lesion likely lipoma measuring 3.3 x 5.5 cm. Right gluteal muscle lipoma measuring 2.8 x 3.9 cm. Left femoral neck sclerotic lesion likely bone island. IMPRESSION: Left-sided hydroureteronephrosis with suggestion of a mucosal lesion along the left bladder wall at the UVJ. Evaluation is suboptimal given the inadequate distension of the bladder and lack of contrast . Recommend further assessment with cystoscopy. Nonobstructive nephrolithiasis of the right kidney. Numerous hepatic and liver cysts are stable to prior chest CT and enlarged to prior CT abdomen from 2022. Multiple pulmonary nodules and masses consistent with known metastasis, grossly similar to recent chest CT. Multiple bilateral subcentimeter and pericentimeter retroperitoneal lymph nodes, reactive versus metastatic. Severe prostatomegaly. Additional ancillary findings as above. Electronically Signed   By: Megan  Zare M.D.   On: 09/26/2023 20:18    Assessment/Plan 1. Bronchitis (Primary) Cough x 2 weeks Abnormal lung sounds with weakness, concern for pna despite neg xray due to dehydration - amoxicillin -clavulanate (AUGMENTIN ) 875-125 MG tablet; Take 1 tablet by mouth 2 (two) times daily for 7  days.  2. Orthostatic hypotension Off rapaflo  for 4 days Slight improvement but still present Give 1 more liter of saline at 80cc/hr  3. Right thigh pain Normal exam, no fall Coumadin  is on hold but INR has been >3 so DVT not likely  He is having a bone scan next week ?prostate ca  vs muscle strain  4. Weight loss Decreased intake, acute illness.  Trying magic cup IVF Alb 2.8   Family/ staff Communication: discuss with pt and his family at bedside.   Labs/tests ordered:  add CBC to next lab draw BMP and INR pending 10/26/2023

## 2023-10-26 NOTE — Telephone Encounter (Signed)
 Contacted pt's daughter, Page, who reports pt is not doing well and continues to have hypotension. Pt has been scheduled for a PET scan for 9/15. Advised if anything is needed feel free to contact the coumadin  clinic. Page verbalized understanding.   Jhonny Frederick, Charity fundraiser, at rehab facility for INR result. She reports labs were drawn this morning but the result is not back yet. Advised this nurse will be in the office today until 3 pm. She reports if the result comes back after that their NP will dose the pt's warfarin.

## 2023-10-26 NOTE — Telephone Encounter (Signed)
 Received VM at 3 pm from [place] at [facility] reporting the INR lab result is not back yet. She reported the NP at the facility will dose the pt over the weekend and we can f/u on Monday, 9/15.

## 2023-10-28 NOTE — Telephone Encounter (Signed)
 Please schedule with Coumadin  clinic ASAP.  I will not be able to manage his INR/Coumadin  in timely fashion.  Thanks

## 2023-10-28 NOTE — Telephone Encounter (Signed)
I have not received anything yet. Thanks.

## 2023-10-28 NOTE — Telephone Encounter (Signed)
 Clarence Hopkins, Are you planning to manage his Coumadin  this way or are you planning to see him physically at the Coumadin  clinic? I will not be able to manage his Coumadin  this way in a timely fashion. Thanks

## 2023-10-29 ENCOUNTER — Encounter (HOSPITAL_COMMUNITY)
Admission: RE | Admit: 2023-10-29 | Discharge: 2023-10-29 | Disposition: A | Discharge status: Home / Self Care | Source: Ambulatory Visit | Attending: Urology | Admitting: Urology

## 2023-10-29 DIAGNOSIS — Z7901 Long term (current) use of anticoagulants: Secondary | ICD-10-CM | POA: Diagnosis not present

## 2023-10-29 DIAGNOSIS — Z85850 Personal history of malignant neoplasm of thyroid: Secondary | ICD-10-CM | POA: Insufficient documentation

## 2023-10-29 DIAGNOSIS — C772 Secondary and unspecified malignant neoplasm of intra-abdominal lymph nodes: Secondary | ICD-10-CM | POA: Diagnosis not present

## 2023-10-29 DIAGNOSIS — C73 Malignant neoplasm of thyroid gland: Secondary | ICD-10-CM | POA: Diagnosis not present

## 2023-10-29 DIAGNOSIS — R2689 Other abnormalities of gait and mobility: Secondary | ICD-10-CM | POA: Diagnosis not present

## 2023-10-29 DIAGNOSIS — C7951 Secondary malignant neoplasm of bone: Secondary | ICD-10-CM | POA: Insufficient documentation

## 2023-10-29 DIAGNOSIS — R918 Other nonspecific abnormal finding of lung field: Secondary | ICD-10-CM | POA: Insufficient documentation

## 2023-10-29 DIAGNOSIS — R972 Elevated prostate specific antigen [PSA]: Secondary | ICD-10-CM | POA: Insufficient documentation

## 2023-10-29 DIAGNOSIS — I4819 Other persistent atrial fibrillation: Secondary | ICD-10-CM | POA: Diagnosis not present

## 2023-10-29 DIAGNOSIS — R278 Other lack of coordination: Secondary | ICD-10-CM | POA: Diagnosis not present

## 2023-10-29 DIAGNOSIS — N179 Acute kidney failure, unspecified: Secondary | ICD-10-CM | POA: Diagnosis not present

## 2023-10-29 MED ORDER — FLOTUFOLASTAT F 18 GALLIUM 296-5846 MBQ/ML IV SOLN
8.3600 | Freq: Once | INTRAVENOUS | Status: AC
  Administered 2023-10-29: 8.36 via INTRAVENOUS

## 2023-10-29 NOTE — Telephone Encounter (Signed)
 Contacted Danielle, RN at [facility], and advised PCP does not feel the coumadin  clinic can manage pt's warfarin safely with the delay in receiving results. PCP is requesting facility manage warfarin or the pt can come to the coumadin  clinic and it will be managed here.   Danielle verbalized understanding and reports she is going to advised her DON of the situation and if there is any problem she will contact the coumadin  clinic.

## 2023-10-29 NOTE — Telephone Encounter (Signed)
 Contacted Danielle, RN at [facility] and advised PCP does not feel the coumadin  clinic can manage pt's warfarin safely with the delay in receiving results. PCP is requesting facility manage warfarin or the pt can come to the coumadin  clinic and it will be managed here.   Danielle verbalized understanding and reports she is going to advised her DON of the situation and if there is any problem she will contact the coumadin  clinic.   Sent msg to PCP.

## 2023-10-30 DIAGNOSIS — R278 Other lack of coordination: Secondary | ICD-10-CM | POA: Diagnosis not present

## 2023-10-30 DIAGNOSIS — N179 Acute kidney failure, unspecified: Secondary | ICD-10-CM | POA: Diagnosis not present

## 2023-10-30 DIAGNOSIS — R2689 Other abnormalities of gait and mobility: Secondary | ICD-10-CM | POA: Diagnosis not present

## 2023-10-30 NOTE — Telephone Encounter (Signed)
 Noted! Thank you

## 2023-10-31 DIAGNOSIS — N179 Acute kidney failure, unspecified: Secondary | ICD-10-CM | POA: Diagnosis not present

## 2023-10-31 DIAGNOSIS — R278 Other lack of coordination: Secondary | ICD-10-CM | POA: Diagnosis not present

## 2023-10-31 DIAGNOSIS — R2689 Other abnormalities of gait and mobility: Secondary | ICD-10-CM | POA: Diagnosis not present

## 2023-11-01 ENCOUNTER — Inpatient Hospital Stay: Attending: Hematology & Oncology

## 2023-11-01 ENCOUNTER — Inpatient Hospital Stay: Admitting: Hematology & Oncology

## 2023-11-01 ENCOUNTER — Other Ambulatory Visit: Payer: Self-pay | Admitting: Adult Health

## 2023-11-01 DIAGNOSIS — R278 Other lack of coordination: Secondary | ICD-10-CM | POA: Diagnosis not present

## 2023-11-01 DIAGNOSIS — Z7901 Long term (current) use of anticoagulants: Secondary | ICD-10-CM | POA: Diagnosis not present

## 2023-11-01 DIAGNOSIS — I480 Paroxysmal atrial fibrillation: Secondary | ICD-10-CM | POA: Diagnosis not present

## 2023-11-01 DIAGNOSIS — N179 Acute kidney failure, unspecified: Secondary | ICD-10-CM | POA: Diagnosis not present

## 2023-11-01 DIAGNOSIS — R2689 Other abnormalities of gait and mobility: Secondary | ICD-10-CM | POA: Diagnosis not present

## 2023-11-01 MED ORDER — TEMAZEPAM 7.5 MG PO CAPS: 7.5000 mg | ORAL_CAPSULE | Freq: Every evening | ORAL | 0 refills | Status: DC | PRN

## 2023-11-02 ENCOUNTER — Other Ambulatory Visit (HOSPITAL_BASED_OUTPATIENT_CLINIC_OR_DEPARTMENT_OTHER): Payer: Self-pay

## 2023-11-03 DIAGNOSIS — Z7901 Long term (current) use of anticoagulants: Secondary | ICD-10-CM | POA: Diagnosis not present

## 2023-11-05 ENCOUNTER — Encounter: Payer: Self-pay | Admitting: Internal Medicine

## 2023-11-05 ENCOUNTER — Non-Acute Institutional Stay (SKILLED_NURSING_FACILITY): Payer: Self-pay | Admitting: Internal Medicine

## 2023-11-05 DIAGNOSIS — C73 Malignant neoplasm of thyroid gland: Secondary | ICD-10-CM | POA: Diagnosis not present

## 2023-11-05 DIAGNOSIS — R339 Retention of urine, unspecified: Secondary | ICD-10-CM | POA: Diagnosis not present

## 2023-11-05 DIAGNOSIS — Z86718 Personal history of other venous thrombosis and embolism: Secondary | ICD-10-CM | POA: Diagnosis not present

## 2023-11-05 DIAGNOSIS — I480 Paroxysmal atrial fibrillation: Secondary | ICD-10-CM | POA: Diagnosis not present

## 2023-11-05 DIAGNOSIS — Z7901 Long term (current) use of anticoagulants: Secondary | ICD-10-CM | POA: Diagnosis not present

## 2023-11-05 DIAGNOSIS — C7951 Secondary malignant neoplasm of bone: Secondary | ICD-10-CM | POA: Diagnosis not present

## 2023-11-05 DIAGNOSIS — C61 Malignant neoplasm of prostate: Secondary | ICD-10-CM

## 2023-11-05 DIAGNOSIS — R634 Abnormal weight loss: Secondary | ICD-10-CM

## 2023-11-05 DIAGNOSIS — R531 Weakness: Secondary | ICD-10-CM

## 2023-11-05 MED ORDER — WARFARIN SODIUM 2 MG PO TABS: 2.0000 mg | ORAL_TABLET | Freq: Every day | ORAL | Status: DC

## 2023-11-05 MED ORDER — ORGOVYX 120 MG PO TABS: 120.0000 mg | ORAL_TABLET | Freq: Every day | ORAL | Status: DC

## 2023-11-05 NOTE — Progress Notes (Signed)
 Location: Medical illustrator of Service:  SNF (31)  Provider:   Code Status: DNR Goals of Care:     10/16/2023   11:41 AM  Advanced Directives  Does Patient Have a Medical Advance Directive? Yes  Type of Estate agent of [place];Living will  Does patient want to make changes to medical advance directive? No - Patient declined  Copy of Healthcare Power of Attorney in Chart? No - copy requested     Chief Complaint  Patient presents with   Acute Visit    HPI: Patient is a 89 y.o. 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  Also has a history of basal Cell carcinoma under the right eye which has been stable   Recently patient had a PET scan done which shows primary prostate adenocarcinoma with extensive skeletal metastasis. He Is now on Orgovyx  He did feel weak and Nauseated after first loading dose But since then he is feeling little better.  Had a better breakfast this morning. But overall patient has become much more weaker.  He is unable to get out of the bed because of weakness.  Has lost almost 10 pounds in the past few weeks.  The family is aware that he would not be able to go back to his apartment as he needs 2-3 caregivers for his ADL care and to get him up.  He has also developed some skin excoriation in his bottom and left heel. Still Has Foley Catheter Previous History Per his family in the room patient was very active even driving few weeks ago.  When he developed lower abdominal pain Went to ED where he was found to have Urinary Retention Foley catheter was placed. Creatinine elevated at 1.82 mg/dL at time of retention - CT abdomen showed left hydronephrosis, possible mucosal lesion on left bladder side, and right kidney nonobstructive nephrolithiasis Past Medical History:  Diagnosis Date    Arthritis    Cancer (HCC) 2003   melanoma  L ear   GERD (gastroesophageal reflux disease)    Hypothyroidism    post op   Lung mass    Pulmonary embolism (HCC) 2005 & 2007   protein C deficiency    Past Surgical History:  Procedure Laterality Date   BRONCHIAL BRUSHINGS  07/15/2019   Procedure: BRONCHIAL BRUSHINGS;  Surgeon: Jude Harden GAILS, MD;  Location: [facility] ENDOSCOPY;  Service: Cardiopulmonary;;   BRONCHIAL WASHINGS  07/15/2019   Procedure: BRONCHIAL WASHINGS;  Surgeon: Jude Harden GAILS, MD;  Location: [facility] ENDOSCOPY;  Service: Cardiopulmonary;;   CARDIAC CATHETERIZATION     > 20 years ago  everything was okay    CERVICAL FUSION      X 2; Dr Unice   COLONOSCOPY     ENDOBRONCHIAL ULTRASOUND N/A 07/15/2019   Procedure: ENDOBRONCHIAL ULTRASOUND;  Surgeon: Jude Harden GAILS, MD;  Location: [facility] ENDOSCOPY;  Service: Cardiopulmonary;  Laterality: N/A;   FINE NEEDLE ASPIRATION  07/15/2019   Procedure: FINE NEEDLE ASPIRATION (FNA) LINEAR;  Surgeon: Jude Harden GAILS, MD;  Location: [facility] ENDOSCOPY;  Service: Cardiopulmonary;;   FINGER ARTHROPLASTY Right 04/17/2013   Procedure: IMPLANT ARTHROPLASTY RIGHT INDEX;  Surgeon: Lamar GAILS Leonor Mickey., MD;  Location: [facility];  Service: Orthopedics;  Laterality: Right;   LUMBAR LAMINECTOMY  2010   Dr Canary   MELANOMA EXCISION  2003   L  ear   THYROIDECTOMY  1971   cytology indefinite   TONSILLECTOMY     TOTAL KNEE ARTHROPLASTY  2004   left   VIDEO BRONCHOSCOPY N/A 07/15/2019   Procedure: VIDEO BRONCHOSCOPY WITHOUT FLUORO;  Surgeon: Jude Harden GAILS, MD;  Location: [facility] ENDOSCOPY;  Service: Cardiopulmonary;  Laterality: N/A;   WISDOM TOOTH EXTRACTION      No Known Allergies  Outpatient Encounter Medications as of 11/05/2023  Medication Sig   acetaminophen  (TYLENOL ) 650 MG CR tablet Take 650 mg by mouth every 4 (four) hours as needed for pain.   Ibuprofen-diphenhydrAMINE HCl 200-25 MG CAPS Take 1 capsule by mouth as needed.   levothyroxine  (SYNTHROID )  200 MCG tablet Take 1 tablet (200 mcg total) by mouth daily with 25mcg   levothyroxine  (SYNTHROID ) 25 MCG tablet Take 1 tablet (25 mcg total) by mouth daily.   Magnesium Hydroxide (MILK OF MAGNESIA PO) Take 45 mLs by mouth as directed. Give 45 ml by mouth every 24 hours as needed for Constipation 45 cc po daily prn except with renal failure   polyethylene glycol (MIRALAX  / GLYCOLAX ) 17 g packet Take 17 g by mouth every morning.   sodium phosphate  Pediatric (FLEET) 3.5-9.5 GM/59ML enema Place 1 enema rectally as directed. Insert 1 unit rectally every 24 hours as needed for Constipation Not to exceed 3 consecutive days   temazepam  (RESTORIL ) 7.5 MG capsule Take 1 capsule (7.5 mg total) by mouth at bedtime as needed for sleep.   Vibegron  (GEMTESA ) 75 MG TABS Take 75 mg by mouth daily.   warfarin (COUMADIN ) 5 MG tablet Take 5 mg by mouth as directed. Monday, Tuesday, Wednesday, Thursday, Saturday, and Sunday. (Patient not taking: Reported on 10/26/2023)   No facility-administered encounter medications on file as of 11/05/2023.    Review of Systems:  Review of Systems  Constitutional:  Positive for activity change, appetite change and unexpected weight change.  HENT: Negative.    Respiratory:  Negative for cough and shortness of breath.   Cardiovascular:  Negative for leg swelling.  Gastrointestinal:  Negative for constipation.  Genitourinary:  Positive for difficulty urinating. Negative for frequency.  Musculoskeletal:  Positive for gait problem. Negative for arthralgias and myalgias.  Skin: Negative.  Negative for rash.  Neurological:  Positive for weakness. Negative for dizziness.  Psychiatric/Behavioral:  Negative for confusion and sleep disturbance.   All other systems reviewed and are negative.   Health Maintenance  Topic Date Due   DTaP/Tdap/Td (1 - Tdap) Never done   Zoster Vaccines- Shingrix (1 of 2) 09/22/1949   Medicare Annual Wellness (AWV)  12/29/2022   Influenza Vaccine   09/14/2023   COVID-19 Vaccine (5 - 2025-26 season) 10/15/2023   Pneumococcal Vaccine: 50+ Years  Completed   HPV VACCINES  Aged Out   Meningococcal B Vaccine  Aged Out    Physical Exam: Vitals:   11/05/23 1506  BP: 118/80  Pulse: 71  Resp: 16  Temp: 98.4 F (36.9 C)  Weight: 167 lb (75.8 kg)   Body mass index is 20.87 kg/m. Physical Exam Vitals reviewed.  Constitutional:      Appearance: Normal appearance.  HENT:     Head: Normocephalic.     Nose: Nose normal.     Mouth/Throat:     Mouth: Mucous membranes are moist.     Pharynx: Oropharynx is clear.  Eyes:     Pupils: Pupils are equal, round, and reactive to light.  Cardiovascular:     Rate and Rhythm: Normal rate  and regular rhythm.     Pulses: Normal pulses.     Heart sounds: No murmur heard. Pulmonary:     Effort: Pulmonary effort is normal. No respiratory distress.     Breath sounds: Normal breath sounds. No rales.  Abdominal:     General: Abdomen is flat. Bowel sounds are normal.     Palpations: Abdomen is soft.  Musculoskeletal:        General: No swelling.     Cervical back: Neck supple.  Skin:    General: Skin is warm.  Neurological:     General: No focal deficit present.     Mental Status: He is alert and oriented to person, place, and time.  Psychiatric:        Mood and Affect: Mood normal.        Thought Content: Thought content normal.     Labs reviewed: Basic Metabolic Panel: Recent Labs    06/21/23 0906 08/31/23 1502 08/31/23 1502 09/06/23 1216 09/26/23 1149 10/01/23 1512 10/19/23 0000  NA  --  138  --  137 135 137 137  K  --  4.5  --  4.9 4.7 4.9 4.4  CL  --  102  --  102 101 101 101  CO2  --  31  --  28 26 27  28*  GLUCOSE  --  104*  --  83 89 100*  --   BUN  --  15  --  13 23 14 19   CREATININE  --  1.00   < > 1.03 1.82* 1.19 1.0  CALCIUM  --  9.3  --  8.7* 8.7* 8.6* 8.3*  TSH 75.600* 0.795  --   --   --  0.676  --    < > = values in this interval not displayed.   Liver  Function Tests: Recent Labs    09/06/23 1216 09/26/23 1149 10/01/23 1512 10/19/23 0000  AST 22 20 22 20   ALT 13 15 14 20   ALKPHOS 132* 143* 154* 183*  BILITOT 0.6 1.1 0.5  --   PROT 6.8 7.8 7.0  --   ALBUMIN 3.5 3.5 3.5 2.8*   Recent Labs    09/26/23 1149  LIPASE 35   No results for input(s): AMMONIA in the last 8760 hours. CBC: Recent Labs    08/31/23 1502 09/06/23 1216 09/26/23 1149 10/01/23 1512 10/19/23 0000  WBC 7.0 5.5 7.4 8.3 5.5  NEUTROABS 4.4 3.2  --  5.6  --   HGB 11.8* 11.2* 13.1 12.7* 11.8*  HCT 37.2* 35.9* 42.5 40.3 36*  MCV 89.4 90.2 91.2 91.0  --   PLT 282 237 253 274 250   Lipid Panel: No results for input(s): CHOL, HDL, LDLCALC, TRIG, CHOLHDL, LDLDIRECT in the last 8760 hours. No results found for: HGBA1C  Procedures since last visit: NM PET (PSMA) SKULL TO MID THIGH Addendum Date: 10/30/2023 ADDENDUM REPORT: 10/30/2023 10:31 ADDENDUM: Additional finding: Radiotracer avid LEFT supraclavicular lymph node measuring 9 mm on image 58 is consistent with prostate cancer nodal metastasis. Electronically Signed   By: Jackquline Boxer M.D.   On: 10/30/2023 10:31   Result Date: 10/30/2023 CLINICAL DATA:  Elevated PSA. 89 year old male. History of papillary thyroid  carcinoma. EXAM: NUCLEAR MEDICINE PET SKULL BASE TO THIGH TECHNIQUE: 8.4 mCi Flotufolastat (Posluma ) was injected intravenously. Full-ring PET imaging was performed from the skull base to thigh after the radiotracer. CT data was obtained and used for attenuation correction and anatomic localization. COMPARISON:  CT 09/26/2023 FINDINGS:  NECK No radiotracer activity in neck lymph nodes. Incidental CT finding: None. CHEST Bilateral pulmonary nodules and masses at the lung bases. Example mass at the LEFT lung base measuring 3.3 cm has mild radiotracer activity SUV max equal 6 4. Smaller nodule in the RIGHT middle lobe measuring 15 mm has relatively low radiotracer activity (SUV max equal 5.).  Large RIGHT lower lobe mass measuring 5 cm with SUV max 5.5. Incidental CT finding: Coronary artery calcification and aortic atherosclerotic calcification. ABDOMEN/PELVIS Prostate: Focal intense radiotracer activity in the apex of the prostate consistent with prostate carcinoma (image 213). Foley catheter noted Lymph nodes: Intensely radiotracer avid small iliac nodes and larger periaortic retroperitoneal nodes. For example LEFT operator node SUV max equal 26 on image 193 measures 5 mm. Retroperitoneal lymph node at the level of the renal veins LEFT of the aorta measuring 12 mm with SUV max equal 35 on image 155. Liver: No evidence of liver metastasis. Incidental CT finding: Large bilateral renal cysts. Multiple liver cysts. Atherosclerotic calcification of the aorta. Foley catheter within the bladder. Mild LEFT hydroureter. SKELETON Intensely radiotracer avid skeletal metastasis within the spine, pelvis, ribs as well as the upper and lower extremities. Lesions are relatively small and accompanied by sclerotic change on CT. For example lesion in the proximal RIGHT femur with SUV max equal 23 on image 218. Lesion in the LEFT iliac bone with SUV max equal 39. Lesion in the L5 with SUV max equal 28. Lesion in the head of the RIGHT humerus with SUV max equal 28. There are approximately 100 skeletal lesions. IMPRESSION: 1. Intense radiotracer activity in the apex of the prostate gland consistent with primary prostate adenocarcinoma. 2. Intensely radiotracer avid iliac and periaortic retroperitoneal nodal metastasis. 3. Extensive radiotracer avid skeletal metastasis involving the spine, pelvis, ribs, and upper and lower extremities. Lesions too numerous to count. 4. Bilateral lower lobe pulmonary nodules and masses. These lesions have relatively mild radiotracer activity. Findings most consistent with nonspecific activity associated with thyroid  cancer pulmonary metastasis. 5.  Aortic Atherosclerosis (ICD10-I70.0).  Electronically Signed: By: Jackquline Boxer M.D. On: 10/30/2023 08:41    Assessment/Plan 1. Prostate cancer metastatic to bone [facility]) (Primary) Now on Orgovyx  per Dr Watt Patient was not sure if he wants to continue the treatment I did bring up hospice with him.  The family wants some time to think about it I have discussed with the social worker here at [facility].  Plan is to move patient to skilled care to continue doing therapy which he is not doing right now.  And continue Orgovyx . Will reevaluate in few weeks to see what patient wishes are  2. Weight loss AS discussed above He says he is feeling better with his Appetite now  3. Generalized weakness Not Getting out of bed or Working with therapy  4. Urinary retention As Foley catheter  5. PULMONARY EMBOLISM, HX OF Coumadin  was on hold due to Elevated INR with Antibiotics Restart Coumadin  at 2 mg tomorrow Today his INR was 2.75 Repeat INR on Thurs  6. Thyroid  cancer (HCC) Follows with Dr Timmy      Labs/tests ordered:   Next appt:  Visit date not found

## 2023-11-06 ENCOUNTER — Telehealth: Payer: Self-pay | Admitting: Hematology & Oncology

## 2023-11-06 NOTE — Telephone Encounter (Signed)
 Called pt to r/s missed office visit from 09/18. Patient declined in scheduliung at this time and stated he would call back once he felt better to r/s.

## 2023-11-07 ENCOUNTER — Other Ambulatory Visit: Payer: Self-pay

## 2023-11-07 DIAGNOSIS — Z7901 Long term (current) use of anticoagulants: Secondary | ICD-10-CM

## 2023-11-07 NOTE — Telephone Encounter (Signed)
 I spoke to front desk at [facility] and they said I could future date this order and they can get it through MyChart. I placed this order since me and the front desk over there were both confused by the message.

## 2023-11-08 ENCOUNTER — Non-Acute Institutional Stay (SKILLED_NURSING_FACILITY): Payer: Self-pay | Admitting: Adult Health

## 2023-11-08 ENCOUNTER — Encounter: Payer: Self-pay | Admitting: Adult Health

## 2023-11-08 DIAGNOSIS — C7951 Secondary malignant neoplasm of bone: Secondary | ICD-10-CM

## 2023-11-08 DIAGNOSIS — C61 Malignant neoplasm of prostate: Secondary | ICD-10-CM | POA: Diagnosis not present

## 2023-11-08 DIAGNOSIS — I10 Essential (primary) hypertension: Secondary | ICD-10-CM | POA: Diagnosis not present

## 2023-11-08 DIAGNOSIS — I480 Paroxysmal atrial fibrillation: Secondary | ICD-10-CM | POA: Diagnosis not present

## 2023-11-08 DIAGNOSIS — R34 Anuria and oliguria: Secondary | ICD-10-CM | POA: Diagnosis not present

## 2023-11-08 DIAGNOSIS — M79651 Pain in right thigh: Secondary | ICD-10-CM

## 2023-11-08 DIAGNOSIS — I951 Orthostatic hypotension: Secondary | ICD-10-CM | POA: Diagnosis not present

## 2023-11-08 DIAGNOSIS — Z7189 Other specified counseling: Secondary | ICD-10-CM

## 2023-11-08 MED ORDER — TRAMADOL HCL 50 MG PO TABS: 50.0000 mg | ORAL_TABLET | Freq: Three times a day (TID) | ORAL | 0 refills | Status: DC | PRN

## 2023-11-08 NOTE — Progress Notes (Signed)
 Location:  Medical illustrator of Service:  SNF (31) Provider:   Bari America, ANP [facility] [phone]   Plotnikov, Karlynn GAILS, MD  Patient Care Team: Garald Karlynn GAILS, MD as PCP - General (Internal Medicine) Rosan Credit, MD as Consulting Physician (Ophthalmology) Joshua Blamer, MD as Consulting Physician (Dermatology) Joshua Joesph Nett, MD as Referring Physician (Endocrinology) Timmy Maude SAUNDERS, MD as Medical Oncologist (Oncology) Szabat, Toribio BROCKS, [facility] (Inactive) (Pharmacist) Szabat, Toribio BROCKS, [facility] (Inactive) as Pharmacist (Pharmacist) Izell Domino, MD as Consulting Physician (Radiation Oncology) Malmfelt, Delon CROME, RN as Oncology Nurse Navigator Odella Refugio PARAS, MD as Consulting Physician (Ophthalmology) Plotnikov, Karlynn GAILS, MD as Consulting Physician (Internal Medicine) Pa, [facility] as Consulting Physician (Ophthalmology)  Extended Emergency Contact Information Primary Emergency Contact: Diven,[name] Address: [address] [place], [place] Home Phone: [phone] Work Phone: [phone] Mobile Phone: [phone] Relation: Spouse  Code Status:  DNR Goals of care: Advanced Directive information    10/16/2023   11:41 AM  Advanced Directives  Does Patient Have a Medical Advance Directive? Yes  Type of Estate agent of [place];Living will  Does patient want to make changes to medical advance directive? No - Patient declined  Copy of Healthcare Power of Attorney in Chart? No - copy requested     Chief Complaint  Patient presents with   Acute Visit    syncope    HPI:  The patient is a 89 year old with metastatic prostate cancer who presents with a syncopal episode.  Syncope and orthostatic symptoms - Experienced a syncopal episode this morning - Initial vital signs: blood pressure 89/56 mmHg, pulse 54 bpm, oxygen saturation  80% - Pulse was in the forties at the time of syncope - Upon standing, skin became clammy and he was not breathing, requiring a sternal rub to respond - After resting, blood pressure improved to 98/54 mmHg, pulse to 60 bpm, oxygen saturation to 95% on two liters of oxygen - Currently alert and oriented, sitting up, eating, and drinking - Lightheadedness and dizziness upon standing  Decreased oral intake and dehydration - Has not been eating or drinking well for several days - Did not eat dinner last night - Minimal fluid intake, only a Coke in the afternoon - Low urine output of 25 cc during the past shift - Dehydration has been an ongoing issue for the past three years  Metastatic prostate cancer - Currently on Orgovyx  for metastatic prostate cancer - Has been in bed for several days  Atrial fibrillation and anticoagulation - History of atrial fibrillation - Currently on Coumadin   DIAGNOSTIC EKG: Atrial fibrillation (11/08/2023) Past Medical History:  Diagnosis Date   Arthritis    Cancer (HCC) 2003   melanoma  L ear   GERD (gastroesophageal reflux disease)    Hypothyroidism    post op   Lung mass    Pulmonary embolism (HCC) 2005 & 2007   protein C deficiency   Past Surgical History:  Procedure Laterality Date   BRONCHIAL BRUSHINGS  07/15/2019   Procedure: BRONCHIAL BRUSHINGS;  Surgeon: Jude Harden GAILS, MD;  Location: [facility] ENDOSCOPY;  Service: Cardiopulmonary;;   BRONCHIAL WASHINGS  07/15/2019   Procedure: BRONCHIAL WASHINGS;  Surgeon: Jude Harden GAILS, MD;  Location: [facility] ENDOSCOPY;  Service: Cardiopulmonary;;   CARDIAC CATHETERIZATION     > 20 years ago  everything was okay    CERVICAL FUSION  X 2; Dr Unice   COLONOSCOPY     ENDOBRONCHIAL ULTRASOUND N/A 07/15/2019   Procedure: ENDOBRONCHIAL ULTRASOUND;  Surgeon: Jude Harden GAILS, MD;  Location: [facility] ENDOSCOPY;  Service: Cardiopulmonary;  Laterality: N/A;   FINE NEEDLE ASPIRATION  07/15/2019   Procedure: FINE NEEDLE ASPIRATION  (FNA) LINEAR;  Surgeon: Jude Harden GAILS, MD;  Location: [facility] ENDOSCOPY;  Service: Cardiopulmonary;;   FINGER ARTHROPLASTY Right 04/17/2013   Procedure: IMPLANT ARTHROPLASTY RIGHT INDEX;  Surgeon: Lamar GAILS Leonor Mickey., MD;  Location: [facility];  Service: Orthopedics;  Laterality: Right;   LUMBAR LAMINECTOMY  2010   Dr Canary   MELANOMA EXCISION  2003   L ear   THYROIDECTOMY  1971   cytology indefinite   TONSILLECTOMY     TOTAL KNEE ARTHROPLASTY  2004   left   VIDEO BRONCHOSCOPY N/A 07/15/2019   Procedure: VIDEO BRONCHOSCOPY WITHOUT FLUORO;  Surgeon: Jude Harden GAILS, MD;  Location[facility] ENDOSCOPY;  Service: Cardiopulmonary;  Laterality: N/A;   WISDOM TOOTH EXTRACTION      No Known Allergies  Outpatient Encounter Medications as of 11/08/2023  Medication Sig   traMADol  (ULTRAM ) 50 MG tablet Take 1 tablet (50 mg total) by mouth every 8 (eight) hours as needed.   acetaminophen  (TYLENOL ) 650 MG CR tablet Take 650 mg by mouth every 4 (four) hours as needed for pain.   Ibuprofen-diphenhydrAMINE HCl 200-25 MG CAPS Take 1 capsule by mouth as needed.   levothyroxine  (SYNTHROID ) 200 MCG tablet Take 1 tablet (200 mcg total) by mouth daily with 25mcg   levothyroxine  (SYNTHROID ) 25 MCG tablet Take 1 tablet (25 mcg total) by mouth daily.   Magnesium Hydroxide (MILK OF MAGNESIA PO) Take 45 mLs by mouth as directed. Give 45 ml by mouth every 24 hours as needed for Constipation 45 cc po daily prn except with renal failure   polyethylene glycol (MIRALAX  / GLYCOLAX ) 17 g packet Take 17 g by mouth every morning.   relugolix  (ORGOVYX ) 120 MG tablet Take 1 tablet (120 mg total) by mouth daily.   sodium phosphate  Pediatric (FLEET) 3.5-9.5 GM/59ML enema Place 1 enema rectally as directed. Insert 1 unit rectally every 24 hours as needed for Constipation Not to exceed 3 consecutive days   temazepam  (RESTORIL ) 7.5 MG capsule Take 1 capsule (7.5 mg total) by mouth at bedtime as needed for sleep.   Vibegron   (GEMTESA ) 75 MG TABS Take 75 mg by mouth daily.   warfarin (COUMADIN ) 2 MG tablet Take 1 tablet (2 mg total) by mouth daily.   No facility-administered encounter medications on file as of 11/08/2023.    Review of Systems  Immunization History  Administered Date(s) Administered   Fluad Trivalent(High Dose 65+) 11/16/2022   INFLUENZA, HIGH DOSE SEASONAL PF 11/23/2015, 11/27/2016, 11/27/2017, 11/14/2018   Influenza Split 11/13/2012   Influenza Whole 11/13/2009, 11/14/2011   Influenza-Unspecified 12/08/2013, 12/07/2014   PFIZER Comirnaty(Gray Top)Covid-19 Tri-Sucrose Vaccine 07/05/2020   PFIZER(Purple Top)SARS-COV-2 Vaccination 03/02/2019, 03/19/2019, 10/23/2019   Pneumococcal Conjugate-13 03/05/2017   Pneumococcal Polysaccharide-23 02/04/2016   RSV,unspecified 11/16/2022   Zoster, Live 03/14/2006   Pertinent  Health Maintenance Due  Topic Date Due   Influenza Vaccine  09/14/2023      12/28/2021    3:29 PM 12/30/2021    1:30 PM 02/24/2022    1:00 PM 08/30/2023    2:34 PM 10/16/2023    3:10 PM  Fall Risk  Falls in the past year? 0   0 0  Was there an injury with  Fall? 0   0 0  Fall Risk Category Calculator 0   0 0  Fall Risk Category (Retired) Low       (RETIRED) Patient Fall Risk Level Low fall risk  Low fall risk  High fall risk     Patient at Risk for Falls Due to No Fall Risks   No Fall Risks Impaired balance/gait  Fall risk Follow up Falls prevention discussed    Falls evaluation completed Falls evaluation completed;Education provided     Data saved with a previous flowsheet row definition   Functional Status Survey:    Vitals:   11/08/23 1208 11/08/23 1209  BP: (!) 89/56 (!) 98/54  Pulse: (!) 54 60  Resp: 12 16  SpO2: (!) 80% 95%   There is no height or weight on file to calculate BMI. Physical Exam  Labs reviewed: Recent Labs    09/06/23 1216 09/26/23 1149 10/01/23 1512 10/19/23 0000  NA 137 135 137 137  K 4.9 4.7 4.9 4.4  CL 102 101 101 101  CO2 28 26  27  28*  GLUCOSE 83 89 100*  --   BUN 13 23 14 19   CREATININE 1.03 1.82* 1.19 1.0  CALCIUM 8.7* 8.7* 8.6* 8.3*   Recent Labs    09/06/23 1216 09/26/23 1149 10/01/23 1512 10/19/23 0000  AST 22 20 22 20   ALT 13 15 14 20   ALKPHOS 132* 143* 154* 183*  BILITOT 0.6 1.1 0.5  --   PROT 6.8 7.8 7.0  --   ALBUMIN 3.5 3.5 3.5 2.8*   Recent Labs    08/31/23 1502 09/06/23 1216 09/26/23 1149 10/01/23 1512 10/19/23 0000  WBC 7.0 5.5 7.4 8.3 5.5  NEUTROABS 4.4 3.2  --  5.6  --   HGB 11.8* 11.2* 13.1 12.7* 11.8*  HCT 37.2* 35.9* 42.5 40.3 36*  MCV 89.4 90.2 91.2 91.0  --   PLT 282 237 253 274 250   Lab Results  Component Value Date   TSH 0.676 10/01/2023   No results found for: HGBA1C Lab Results  Component Value Date   CHOL 141 03/14/2019   HDL 38.00 (L) 03/14/2019   LDLCALC 88 03/14/2019   LDLDIRECT 116.0 03/31/2013   TRIG 74.0 03/14/2019   CHOLHDL 4 03/14/2019    Significant Diagnostic Results in last 30 days:  NM PET (PSMA) SKULL TO MID THIGH Addendum Date: 10/30/2023 ADDENDUM REPORT: 10/30/2023 10:31 ADDENDUM: Additional finding: Radiotracer avid LEFT supraclavicular lymph node measuring 9 mm on image 58 is consistent with prostate cancer nodal metastasis. Electronically Signed   By: Jackquline Boxer M.D.   On: 10/30/2023 10:31   Result Date: 10/30/2023 CLINICAL DATA:  Elevated PSA. 89 year old male. History of papillary thyroid  carcinoma. EXAM: NUCLEAR MEDICINE PET SKULL BASE TO THIGH TECHNIQUE: 8.4 mCi Flotufolastat (Posluma ) was injected intravenously. Full-ring PET imaging was performed from the skull base to thigh after the radiotracer. CT data was obtained and used for attenuation correction and anatomic localization. COMPARISON:  CT 09/26/2023 FINDINGS: NECK No radiotracer activity in neck lymph nodes. Incidental CT finding: None. CHEST Bilateral pulmonary nodules and masses at the lung bases. Example mass at the LEFT lung base measuring 3.3 cm has mild radiotracer  activity SUV max equal 6 4. Smaller nodule in the RIGHT middle lobe measuring 15 mm has relatively low radiotracer activity (SUV max equal 5.). Large RIGHT lower lobe mass measuring 5 cm with SUV max 5.5. Incidental CT finding: Coronary artery calcification and aortic atherosclerotic calcification. ABDOMEN/PELVIS Prostate: Focal  intense radiotracer activity in the apex of the prostate consistent with prostate carcinoma (image 213). Foley catheter noted Lymph nodes: Intensely radiotracer avid small iliac nodes and larger periaortic retroperitoneal nodes. For example LEFT operator node SUV max equal 26 on image 193 measures 5 mm. Retroperitoneal lymph node at the level of the renal veins LEFT of the aorta measuring 12 mm with SUV max equal 35 on image 155. Liver: No evidence of liver metastasis. Incidental CT finding: Large bilateral renal cysts. Multiple liver cysts. Atherosclerotic calcification of the aorta. Foley catheter within the bladder. Mild LEFT hydroureter. SKELETON Intensely radiotracer avid skeletal metastasis within the spine, pelvis, ribs as well as the upper and lower extremities. Lesions are relatively small and accompanied by sclerotic change on CT. For example lesion in the proximal RIGHT femur with SUV max equal 23 on image 218. Lesion in the LEFT iliac bone with SUV max equal 39. Lesion in the L5 with SUV max equal 28. Lesion in the head of the RIGHT humerus with SUV max equal 28. There are approximately 100 skeletal lesions. IMPRESSION: 1. Intense radiotracer activity in the apex of the prostate gland consistent with primary prostate adenocarcinoma. 2. Intensely radiotracer avid iliac and periaortic retroperitoneal nodal metastasis. 3. Extensive radiotracer avid skeletal metastasis involving the spine, pelvis, ribs, and upper and lower extremities. Lesions too numerous to count. 4. Bilateral lower lobe pulmonary nodules and masses. These lesions have relatively mild radiotracer activity. Findings  most consistent with nonspecific activity associated with thyroid  cancer pulmonary metastasis. 5.  Aortic Atherosclerosis (ICD10-I70.0). Electronically Signed: By: Jackquline Boxer M.D. On: 10/30/2023 08:41    Assessment/Plan  Syncope in the setting of dehydration  Syncope likely due to dehydration improved with rest/supine position  - Order 12-lead EKG to assess heart rhythm which showed afib bradycardia resolved - Order CBC and BMP - Administer IV normal saline at 80 cc/hour for one liter.  Chronic urinary retention with indwelling catheter Chronic urinary retention managed with catheter, low urine output due to dehydration. - Monitor urine output and encourage oral intake -using gemtesa  for bladder spasms  Atrial fibrillation  On warfarin for hx of PE but has a hx of afib  Rate is controlled INR check Monday 9/29  Metastatic prostate cancer on Orgovyx  Metastatic prostate cancer managed with Orgovyx , treatment to continue. - Continue Orgovyx  for prostate cancer management.  Thigh pain Due to bone mets Add tramadol  prn   Goals of Care Discussed and agreed on comfort care, avoiding hospitalizations unless necessary. - Respect his preference for comfort care and avoid hospitalizations unless necessary. - Arrange for a pastor to visit for spiritual support. -considering hospice holding off for now.  -most form completed.    Family/ staff Communication: discussed with wife and daughter at the bedside.   Labs/tests ordered:  CBC BMP EKG  Total time :  time greater than 50% of total time spent doing pt counseling and coordination of care

## 2023-11-09 ENCOUNTER — Telehealth: Payer: Self-pay | Admitting: Adult Health

## 2023-11-09 DIAGNOSIS — E860 Dehydration: Secondary | ICD-10-CM | POA: Diagnosis not present

## 2023-11-09 DIAGNOSIS — R339 Retention of urine, unspecified: Secondary | ICD-10-CM | POA: Diagnosis not present

## 2023-11-09 DIAGNOSIS — C61 Malignant neoplasm of prostate: Secondary | ICD-10-CM | POA: Diagnosis not present

## 2023-11-09 DIAGNOSIS — E291 Testicular hypofunction: Secondary | ICD-10-CM | POA: Diagnosis not present

## 2023-11-09 DIAGNOSIS — R861 Abnormal level of hormones in specimens from male genital organs: Secondary | ICD-10-CM | POA: Diagnosis not present

## 2023-11-09 MED ORDER — ORGOVYX 120 MG PO TABS: 120.0000 mg | ORAL_TABLET | ORAL | Status: DC

## 2023-11-09 NOTE — Telephone Encounter (Signed)
 Mr Clevenger's bp is stable with no further syncope. Remains weak with reduced intake Urine output 100 cc over night.  CMP with BUN 19 Cr 1.14 NA 133 K 4.5 Alk phose 276 ALT 27 AST 27 Alb 2.7  Will give one more liter of saline Discussed care with Dr Watt, he recommended changing the Orgovyx  to every other day due to to his current symptoms Testosterone and PSA have been drawn and are pending.

## 2023-11-12 ENCOUNTER — Encounter: Payer: Self-pay | Admitting: Internal Medicine

## 2023-11-12 ENCOUNTER — Non-Acute Institutional Stay (SKILLED_NURSING_FACILITY): Payer: Self-pay | Admitting: Internal Medicine

## 2023-11-12 DIAGNOSIS — R634 Abnormal weight loss: Secondary | ICD-10-CM

## 2023-11-12 DIAGNOSIS — I480 Paroxysmal atrial fibrillation: Secondary | ICD-10-CM | POA: Diagnosis not present

## 2023-11-12 DIAGNOSIS — Z7901 Long term (current) use of anticoagulants: Secondary | ICD-10-CM | POA: Diagnosis not present

## 2023-11-12 DIAGNOSIS — C73 Malignant neoplasm of thyroid gland: Secondary | ICD-10-CM

## 2023-11-12 DIAGNOSIS — L8945 Pressure ulcer of contiguous site of back, buttock and hip, unstageable: Secondary | ICD-10-CM | POA: Diagnosis not present

## 2023-11-12 DIAGNOSIS — C7951 Secondary malignant neoplasm of bone: Secondary | ICD-10-CM

## 2023-11-12 DIAGNOSIS — I951 Orthostatic hypotension: Secondary | ICD-10-CM

## 2023-11-12 DIAGNOSIS — R531 Weakness: Secondary | ICD-10-CM

## 2023-11-12 DIAGNOSIS — C61 Malignant neoplasm of prostate: Secondary | ICD-10-CM | POA: Diagnosis not present

## 2023-11-12 DIAGNOSIS — R339 Retention of urine, unspecified: Secondary | ICD-10-CM | POA: Diagnosis not present

## 2023-11-12 DIAGNOSIS — Z86718 Personal history of other venous thrombosis and embolism: Secondary | ICD-10-CM

## 2023-11-12 NOTE — Progress Notes (Unsigned)
 Location:  [facility] Room Number: 152-P Place of Service:  SNF [phone]) Provider:  Dr. Charlanne Fredia CROME, MD  Patient Care Team: Plotnikov, Karlynn GAILS, MD as PCP - General (Internal Medicine) Rosan Credit, MD as Consulting Physician (Ophthalmology) Joshua Blamer, MD as Consulting Physician (Dermatology) Joshua Joesph Nett, MD as Referring Physician (Endocrinology) Timmy Maude SAUNDERS, MD as Medical Oncologist (Oncology) Szabat, Toribio BROCKS, [facility] (Inactive) (Pharmacist) Szabat, Toribio BROCKS, [facility] (Inactive) as Pharmacist (Pharmacist) Izell Domino, MD as Consulting Physician (Radiation Oncology) Malmfelt, Delon CROME, RN as Oncology Nurse Navigator Odella Refugio PARAS, MD as Consulting Physician (Ophthalmology) Plotnikov, Karlynn GAILS, MD as Consulting Physician (Internal Medicine) Pa, [facility] as Consulting Physician (Ophthalmology)  Extended Emergency Contact Information Primary Emergency Contact: Tyndall,[name] Address: [address] [place], [place] Home Phone: [phone] Work Phone: [phone] Mobile Phone: [phone] Relation: Spouse  Code Status:  DNR Goals of care: Advanced Directive information    11/12/2023   10:32 AM  Advanced Directives  Does Patient Have a Medical Advance Directive? Yes  Type of Estate agent of [place];Living will;Out of facility DNR (pink MOST or yellow form)  Does patient want to make changes to medical advance directive? No - Patient declined  Copy of Healthcare Power of Attorney in Chart? No - copy requested     Chief Complaint  Patient presents with  . Fatigue    Weakness     HPI:  Pt is a 89 y.o. 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  Also has a history of basal Cell  carcinoma under the right eye which has been stable   Recently patient had a PET scan done which shows primary prostate adenocarcinoma with extensive skeletal metastasis. He Is now on Orgovyx   Patient continues to get weak Poor appetite Last Week he tried to get up and Passed out as his BP was low He did eat today but mostly not eating Appetite is Poor Has lost weight Now also has Skin breakdown in his Bottom and Nurses are trying to change his position Family and Patient wanted to talk about Hospice  Still Has Foley Catheter   Past Medical History:  Diagnosis Date  . Arthritis   . Cancer (HCC) 2003   melanoma  L ear  . GERD (gastroesophageal reflux disease)   . Hypothyroidism    post op  . Lung mass   . Pulmonary embolism (HCC) 2005 & 2007   protein C deficiency   Past Surgical History:  Procedure Laterality Date  . BRONCHIAL BRUSHINGS  07/15/2019   Procedure: BRONCHIAL BRUSHINGS;  Surgeon: Jude Harden GAILS, MD;  Location: [facility] ENDOSCOPY;  Service: Cardiopulmonary;;  . BRONCHIAL WASHINGS  07/15/2019   Procedure: BRONCHIAL WASHINGS;  Surgeon: Jude Harden GAILS, MD;  Location: [facility] ENDOSCOPY;  Service: Cardiopulmonary;;  . CARDIAC CATHETERIZATION     > 20 years ago  everything was okay   . CERVICAL FUSION      X 2; Dr Unice  . COLONOSCOPY    . ENDOBRONCHIAL ULTRASOUND N/A 07/15/2019   Procedure: ENDOBRONCHIAL ULTRASOUND;  Surgeon: Jude Harden GAILS, MD;  Location: [facility] ENDOSCOPY;  Service: Cardiopulmonary;  Laterality: N/A;  . FINE NEEDLE ASPIRATION  07/15/2019   Procedure: FINE NEEDLE ASPIRATION (FNA) LINEAR;  Surgeon:  Jude Harden GAILS, MD;  Location: [facility] ENDOSCOPY;  Service: Cardiopulmonary;;  . FINGER ARTHROPLASTY Right 04/17/2013   Procedure: IMPLANT ARTHROPLASTY RIGHT INDEX;  Surgeon: Lamar GAILS Leonor Mickey., MD;  Location: [facility];  Service: Orthopedics;  Laterality: Right;  . LUMBAR LAMINECTOMY  2010   Dr Canary  . MELANOMA EXCISION  2003   L ear  . THYROIDECTOMY  1971    cytology indefinite  . TONSILLECTOMY    . TOTAL KNEE ARTHROPLASTY  2004   left  . VIDEO BRONCHOSCOPY N/A 07/15/2019   Procedure: VIDEO BRONCHOSCOPY WITHOUT FLUORO;  Surgeon: Jude Harden GAILS, MD;  Location: [facility] ENDOSCOPY;  Service: Cardiopulmonary;  Laterality: N/A;  . WISDOM TOOTH EXTRACTION      No Known Allergies  Outpatient Encounter Medications as of 11/12/2023  Medication Sig  . acetaminophen  (TYLENOL ) 650 MG CR tablet Take 650 mg by mouth every 4 (four) hours as needed for pain.  . Ibuprofen-diphenhydrAMINE HCl 200-25 MG CAPS Take 1 capsule by mouth as needed.  . levothyroxine  (SYNTHROID ) 200 MCG tablet Take 1 tablet (200 mcg total) by mouth daily with 25mcg  . levothyroxine  (SYNTHROID ) 25 MCG tablet Take 1 tablet (25 mcg total) by mouth daily.  . Magnesium Hydroxide (MILK OF MAGNESIA PO) Take 45 mLs by mouth as directed. Give 45 ml by mouth every 24 hours as needed for Constipation 45 cc po daily prn except with renal failure  . polyethylene glycol (MIRALAX  / GLYCOLAX ) 17 g packet Take 17 g by mouth every morning.  . relugolix  (ORGOVYX ) 120 MG tablet Take 1 tablet (120 mg total) by mouth every other day.  . sodium phosphate  Pediatric (FLEET) 3.5-9.5 GM/59ML enema Place 1 enema rectally as directed. Insert 1 unit rectally every 24 hours as needed for Constipation Not to exceed 3 consecutive days  . temazepam  (RESTORIL ) 7.5 MG capsule Take 1 capsule (7.5 mg total) by mouth at bedtime as needed for sleep.  . traMADol  (ULTRAM ) 50 MG tablet Take 1 tablet (50 mg total) by mouth every 8 (eight) hours as needed.  . Vibegron  (GEMTESA ) 75 MG TABS Take 75 mg by mouth daily.  SABRA warfarin (COUMADIN ) 2 MG tablet Take 1 tablet (2 mg total) by mouth daily.   No facility-administered encounter medications on file as of 11/12/2023.    Review of Systems  Constitutional:  Positive for activity change, appetite change and unexpected weight change.  HENT: Negative.    Respiratory:  Negative for cough and  shortness of breath.   Cardiovascular:  Negative for leg swelling.  Gastrointestinal:  Positive for constipation.  Genitourinary:  Negative for frequency.  Musculoskeletal:  Positive for gait problem. Negative for arthralgias and myalgias.  Skin:  Positive for wound. Negative for rash.  Neurological:  Positive for dizziness and weakness.  Psychiatric/Behavioral:  Negative for confusion and sleep disturbance.   All other systems reviewed and are negative.   Immunization History  Administered Date(s) Administered  . Fluad Trivalent(High Dose 65+) 11/16/2022  . INFLUENZA, HIGH DOSE SEASONAL PF 11/23/2015, 11/27/2016, 11/27/2017, 11/14/2018  . Influenza Split 11/13/2012  . Influenza Whole 11/13/2009, 11/14/2011  . Influenza-Unspecified 12/08/2013, 12/07/2014  . PFIZER Comirnaty(Gray Top)Covid-19 Tri-Sucrose Vaccine 07/05/2020  . PFIZER(Purple Top)SARS-COV-2 Vaccination 03/02/2019, 03/19/2019, 10/23/2019  . Pneumococcal Conjugate-13 03/05/2017  . Pneumococcal Polysaccharide-23 02/04/2016  . RSV,unspecified 11/16/2022  . Zoster, Live 03/14/2006   Pertinent  Health Maintenance Due  Topic Date Due  . Influenza Vaccine  09/14/2023      12/28/2021    3:29  PM 12/30/2021    1:30 PM 02/24/2022    1:00 PM 08/30/2023    2:34 PM 10/16/2023    3:10 PM  Fall Risk  Falls in the past year? 0   0 0  Was there an injury with Fall? 0   0 0  Fall Risk Category Calculator 0   0 0  Fall Risk Category (Retired) Low       (RETIRED) Patient Fall Risk Level Low fall risk  Low fall risk  High fall risk     Patient at Risk for Falls Due to No Fall Risks   No Fall Risks Impaired balance/gait  Fall risk Follow up Falls prevention discussed    Falls evaluation completed Falls evaluation completed;Education provided     Data saved with a previous flowsheet row definition   Functional Status Survey:    Vitals:   11/12/23 1038  BP: 114/72  Pulse: 78  Resp: 15  Temp: 98.5 F (36.9 C)  SpO2: 92%   Weight: 167 lb 9.6 oz (76 kg)  Height: 6' 3 (1.905 m)   Body mass index is 20.95 kg/m. Physical Exam Vitals reviewed.  Constitutional:      Appearance: Normal appearance.  HENT:     Head: Normocephalic.     Nose: Nose normal.     Mouth/Throat:     Mouth: Mucous membranes are moist.     Pharynx: Oropharynx is clear.  Eyes:     Pupils: Pupils are equal, round, and reactive to light.  Cardiovascular:     Rate and Rhythm: Normal rate and regular rhythm.     Pulses: Normal pulses.     Heart sounds: No murmur heard. Pulmonary:     Effort: Pulmonary effort is normal. No respiratory distress.     Breath sounds: Normal breath sounds. No rales.  Abdominal:     General: Abdomen is flat. Bowel sounds are normal.     Palpations: Abdomen is soft.  Musculoskeletal:        General: No swelling.     Cervical back: Neck supple.  Skin:    General: Skin is warm.  Neurological:     General: No focal deficit present.     Mental Status: He is alert and oriented to person, place, and time.  Psychiatric:        Mood and Affect: Mood normal.        Thought Content: Thought content normal.     Labs reviewed: Recent Labs    09/06/23 1216 09/26/23 1149 10/01/23 1512 10/19/23 0000  NA 137 135 137 137  K 4.9 4.7 4.9 4.4  CL 102 101 101 101  CO2 28 26 27  28*  GLUCOSE 83 89 100*  --   BUN 13 23 14 19   CREATININE 1.03 1.82* 1.19 1.0  CALCIUM 8.7* 8.7* 8.6* 8.3*   Recent Labs    09/06/23 1216 09/26/23 1149 10/01/23 1512 10/19/23 0000  AST 22 20 22 20   ALT 13 15 14 20   ALKPHOS 132* 143* 154* 183*  BILITOT 0.6 1.1 0.5  --   PROT 6.8 7.8 7.0  --   ALBUMIN 3.5 3.5 3.5 2.8*   Recent Labs    08/31/23 1502 09/06/23 1216 09/26/23 1149 10/01/23 1512 10/19/23 0000  WBC 7.0 5.5 7.4 8.3 5.5  NEUTROABS 4.4 3.2  --  5.6  --   HGB 11.8* 11.2* 13.1 12.7* 11.8*  HCT 37.2* 35.9* 42.5 40.3 36*  MCV 89.4 90.2 91.2 91.0  --  PLT 282 237 253 274 250   Lab Results  Component Value Date    TSH 0.676 10/01/2023   No results found for: HGBA1C Lab Results  Component Value Date   CHOL 141 03/14/2019   HDL 38.00 (L) 03/14/2019   LDLCALC 88 03/14/2019   LDLDIRECT 116.0 03/31/2013   TRIG 74.0 03/14/2019   CHOLHDL 4 03/14/2019    Significant Diagnostic Results in last 30 days:  NM PET (PSMA) SKULL TO MID THIGH Addendum Date: 10/30/2023 ADDENDUM REPORT: 10/30/2023 10:31 ADDENDUM: Additional finding: Radiotracer avid LEFT supraclavicular lymph node measuring 9 mm on image 58 is consistent with prostate cancer nodal metastasis. Electronically Signed   By: Jackquline Boxer M.D.   On: 10/30/2023 10:31   Result Date: 10/30/2023 CLINICAL DATA:  Elevated PSA. 89 year old male. History of papillary thyroid  carcinoma. EXAM: NUCLEAR MEDICINE PET SKULL BASE TO THIGH TECHNIQUE: 8.4 mCi Flotufolastat (Posluma ) was injected intravenously. Full-ring PET imaging was performed from the skull base to thigh after the radiotracer. CT data was obtained and used for attenuation correction and anatomic localization. COMPARISON:  CT 09/26/2023 FINDINGS: NECK No radiotracer activity in neck lymph nodes. Incidental CT finding: None. CHEST Bilateral pulmonary nodules and masses at the lung bases. Example mass at the LEFT lung base measuring 3.3 cm has mild radiotracer activity SUV max equal 6 4. Smaller nodule in the RIGHT middle lobe measuring 15 mm has relatively low radiotracer activity (SUV max equal 5.). Large RIGHT lower lobe mass measuring 5 cm with SUV max 5.5. Incidental CT finding: Coronary artery calcification and aortic atherosclerotic calcification. ABDOMEN/PELVIS Prostate: Focal intense radiotracer activity in the apex of the prostate consistent with prostate carcinoma (image 213). Foley catheter noted Lymph nodes: Intensely radiotracer avid small iliac nodes and larger periaortic retroperitoneal nodes. For example LEFT operator node SUV max equal 26 on image 193 measures 5 mm. Retroperitoneal lymph  node at the level of the renal veins LEFT of the aorta measuring 12 mm with SUV max equal 35 on image 155. Liver: No evidence of liver metastasis. Incidental CT finding: Large bilateral renal cysts. Multiple liver cysts. Atherosclerotic calcification of the aorta. Foley catheter within the bladder. Mild LEFT hydroureter. SKELETON Intensely radiotracer avid skeletal metastasis within the spine, pelvis, ribs as well as the upper and lower extremities. Lesions are relatively small and accompanied by sclerotic change on CT. For example lesion in the proximal RIGHT femur with SUV max equal 23 on image 218. Lesion in the LEFT iliac bone with SUV max equal 39. Lesion in the L5 with SUV max equal 28. Lesion in the head of the RIGHT humerus with SUV max equal 28. There are approximately 100 skeletal lesions. IMPRESSION: 1. Intense radiotracer activity in the apex of the prostate gland consistent with primary prostate adenocarcinoma. 2. Intensely radiotracer avid iliac and periaortic retroperitoneal nodal metastasis. 3. Extensive radiotracer avid skeletal metastasis involving the spine, pelvis, ribs, and upper and lower extremities. Lesions too numerous to count. 4. Bilateral lower lobe pulmonary nodules and masses. These lesions have relatively mild radiotracer activity. Findings most consistent with nonspecific activity associated with thyroid  cancer pulmonary metastasis. 5.  Aortic Atherosclerosis (ICD10-I70.0). Electronically Signed: By: Jackquline Boxer M.D. On: 10/30/2023 08:41    Assessment/Plan 1. Syncope due to orthostatic hypotension (Primary) Got IV fluids He still feel Weak and not getting up   2. Prostate cancer metastatic to bone [facility]) On Orgovyx  His Testosterone has Come down to 26 He is now on every other day dose Patient does  want to talk to Hospice So making the referral He is not taking anything for Pain  3. Paroxysmal A-fib (HCC) Was found to have Bradycardia with A Fib He is already on  Coumadin   4. Long term (current) use of anticoagulants INR today again was high Hold the coumadin   5. Weight loss Due to Poor Appetite  6. Generalized weakness Not able to work with therapy  7. Urinary retention Has Foley  8. PULMONARY EMBOLISM, HX OF On Coumadin   9. Thyroid  cancer (HCC)  10. Pressure injury of contiguous region involving buttock and hip, unstageable, unspecified laterality (HCC) Polymem with Zinc He already has [facility] are working with position change   Hospice referral made Reorder INR Family/ staff Communication:   Labs/tests ordered:  ***

## 2023-11-13 MED ORDER — WARFARIN SODIUM 2 MG PO TABS
1.0000 mg | ORAL_TABLET | Freq: Every day | ORAL | Status: DC
Start: 2023-11-13 — End: 2023-11-20

## 2023-11-15 DIAGNOSIS — Z5181 Encounter for therapeutic drug level monitoring: Secondary | ICD-10-CM | POA: Diagnosis not present

## 2023-11-15 NOTE — Telephone Encounter (Signed)
 error

## 2023-11-20 ENCOUNTER — Non-Acute Institutional Stay (SKILLED_NURSING_FACILITY): Payer: Self-pay | Admitting: Orthopedic Surgery

## 2023-11-20 ENCOUNTER — Encounter: Payer: Self-pay | Admitting: Orthopedic Surgery

## 2023-11-20 DIAGNOSIS — I480 Paroxysmal atrial fibrillation: Secondary | ICD-10-CM | POA: Diagnosis not present

## 2023-11-20 DIAGNOSIS — R627 Adult failure to thrive: Secondary | ICD-10-CM

## 2023-11-20 DIAGNOSIS — C61 Malignant neoplasm of prostate: Secondary | ICD-10-CM

## 2023-11-20 DIAGNOSIS — L89153 Pressure ulcer of sacral region, stage 3: Secondary | ICD-10-CM | POA: Diagnosis not present

## 2023-11-20 DIAGNOSIS — C7951 Secondary malignant neoplasm of bone: Secondary | ICD-10-CM

## 2023-11-20 DIAGNOSIS — Z7901 Long term (current) use of anticoagulants: Secondary | ICD-10-CM

## 2023-11-20 MED ORDER — LORAZEPAM 0.5 MG PO TABS
0.5000 mg | ORAL_TABLET | Freq: Two times a day (BID) | ORAL | 1 refills | Status: DC | PRN
Start: 2023-11-20 — End: 2023-12-03

## 2023-11-20 MED ORDER — MORPHINE SULFATE (CONCENTRATE) 20 MG/ML PO SOLN: 5.0000 mg | ORAL | 0 refills | Status: DC | PRN

## 2023-11-20 NOTE — Progress Notes (Unsigned)
 Location:  [facility] Room Number: 152/A Place of Service:  SNF (31) Provider:  Greig FORBES Cluster, NP   Plotnikov, Karlynn GAILS, MD  Patient Care Team: Garald Karlynn GAILS, MD as PCP - General (Internal Medicine) Rosan Credit, MD as Consulting Physician (Ophthalmology) Joshua Blamer, MD as Consulting Physician (Dermatology) Joshua Joesph Nett, MD as Referring Physician (Endocrinology) Timmy Maude SAUNDERS, MD as Medical Oncologist (Oncology) Szabat, Toribio BROCKS, [facility] (Inactive) (Pharmacist) Szabat, Toribio BROCKS, [facility] (Inactive) as Pharmacist (Pharmacist) Izell Domino, MD as Consulting Physician (Radiation Oncology) Malmfelt, Delon CROME, RN as Oncology Nurse Navigator Odella Refugio PARAS, MD as Consulting Physician (Ophthalmology) Plotnikov, Karlynn GAILS, MD as Consulting Physician (Internal Medicine) Pa, [facility] as Consulting Physician (Ophthalmology)  Extended Emergency Contact Information Primary Emergency Contact: Craw,[name] Address: [address] [place], [place] Home Phone: [phone] Work Phone: [phone] Mobile Phone: [phone] Relation: Spouse  Code Status:  DNR Goals of care: Advanced Directive information    11/12/2023   10:32 AM  Advanced Directives  Does Patient Have a Medical Advance Directive? Yes  Type of Estate agent of [place];Living will;Out of facility DNR (pink MOST or yellow form)  Does patient want to make changes to medical advance directive? No - Patient declined  Copy of Healthcare Power of Attorney in Chart? No - copy requested     Chief Complaint  Patient presents with   Acute Visit    Stage III pressure sore    HPI:  Pt is a 89 y.o. male seen today for acute visit due to stage III pressure ulcer.   He was recently admitted to rehab 09/01 due to lower abdominal pain. 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   Recent PET scan showed primary prostate adenocarcinoma with bone metastasis. Appetite continues to be poor. Nursing notes he is barely eating or drinking. Also having trouble taking po medications at times. He has stage III pressure ulcer to sacrum. Dressing changes to area are causing increased pain that is not resolved with Tramadol . Hospice was consulted. Patient and family undecided to proceed with hospice. Patient and I discussed goals of care and pain control today. He is willing to try morphine  for pain. He does not want future INR checks. Afebrile. Vitals stable.       Past Medical History:  Diagnosis Date   Arthritis    Cancer (HCC) 2003   melanoma  L ear   GERD (gastroesophageal reflux disease)    Hypothyroidism    post op   Lung mass    Pulmonary embolism (HCC) 2005 & 2007   protein C deficiency   Past Surgical History:  Procedure Laterality Date   BRONCHIAL BRUSHINGS  07/15/2019   Procedure: BRONCHIAL BRUSHINGS;  Surgeon: Jude Harden GAILS, MD;  Location: [facility] ENDOSCOPY;  Service: Cardiopulmonary;;   BRONCHIAL WASHINGS  07/15/2019   Procedure: BRONCHIAL WASHINGS;  Surgeon: Jude Harden GAILS, MD;  Location: [facility] ENDOSCOPY;  Service: Cardiopulmonary;;   CARDIAC CATHETERIZATION     > 20 years ago  everything was okay    CERVICAL FUSION      X 2; Dr Unice   COLONOSCOPY     ENDOBRONCHIAL ULTRASOUND N/A 07/15/2019   Procedure: ENDOBRONCHIAL ULTRASOUND;  Surgeon: Jude Harden GAILS, MD;  Location: [facility] ENDOSCOPY;  Service: Cardiopulmonary;  Laterality:  N/A;   FINE NEEDLE ASPIRATION  07/15/2019   Procedure: FINE NEEDLE ASPIRATION (FNA) LINEAR;  Surgeon: Jude Harden GAILS, MD;  Location: [facility] ENDOSCOPY;  Service: Cardiopulmonary;;   FINGER ARTHROPLASTY Right 04/17/2013   Procedure: IMPLANT ARTHROPLASTY RIGHT INDEX;  Surgeon: Lamar GAILS Leonor Mickey., MD;  Location: [facility];  Service: Orthopedics;  Laterality: Right;   LUMBAR LAMINECTOMY  2010   Dr Canary   MELANOMA EXCISION  2003   L ear   THYROIDECTOMY  1971   cytology indefinite   TONSILLECTOMY     TOTAL KNEE ARTHROPLASTY  2004   left   VIDEO BRONCHOSCOPY N/A 07/15/2019   Procedure: VIDEO BRONCHOSCOPY WITHOUT FLUORO;  Surgeon: Jude Harden GAILS, MD;  Location: [facility] ENDOSCOPY;  Service: Cardiopulmonary;  Laterality: N/A;   WISDOM TOOTH EXTRACTION      No Known Allergies  Outpatient Encounter Medications as of 11/20/2023  Medication Sig   acetaminophen  (TYLENOL ) 650 MG CR tablet Take 650 mg by mouth every 4 (four) hours as needed for pain.   Ibuprofen-diphenhydrAMINE HCl 200-25 MG CAPS Take 1 capsule by mouth as needed.   levothyroxine  (SYNTHROID ) 200 MCG tablet Take 1 tablet (200 mcg total) by mouth daily with 25mcg   levothyroxine  (SYNTHROID ) 25 MCG tablet Take 1 tablet (25 mcg total) by mouth daily.   Magnesium Hydroxide (MILK OF MAGNESIA PO) Take 45 mLs by mouth as directed. Give 45 ml by mouth every 24 hours as needed for Constipation 45 cc po daily prn except with renal failure   polyethylene glycol (MIRALAX  / GLYCOLAX ) 17 g packet Take 17 g by mouth every morning.   relugolix  (ORGOVYX ) 120 MG tablet Take 1 tablet (120 mg total) by mouth every other day.   sodium phosphate  Pediatric (FLEET) 3.5-9.5 GM/59ML enema Place 1 enema rectally as directed. Insert 1 unit rectally every 24 hours as needed for Constipation Not to exceed 3 consecutive days   temazepam  (RESTORIL ) 7.5 MG capsule Take 1 capsule (7.5 mg total) by mouth at bedtime as needed for sleep.   traMADol  (ULTRAM ) 50 MG tablet Take 1 tablet (50 mg total) by mouth every 8 (eight) hours as needed.   Vibegron  (GEMTESA ) 75 MG TABS Take 75 mg by mouth daily.   warfarin (COUMADIN ) 2 MG tablet Take 0.5 tablets (1 mg total) by mouth daily.   No facility-administered encounter medications on file as of 11/20/2023.    Review of Systems   Constitutional:  Positive for appetite change and fatigue.  Respiratory: Negative.    Cardiovascular: Negative.   Gastrointestinal:  Positive for constipation.  Genitourinary:  Negative for frequency.  Musculoskeletal:  Positive for gait problem.  Skin:  Positive for wound.  Neurological:  Positive for weakness.  Psychiatric/Behavioral:  Negative for confusion and dysphoric mood.     Immunization History  Administered Date(s) Administered   Fluad Trivalent(High Dose 65+) 11/16/2022   INFLUENZA, HIGH DOSE SEASONAL PF 11/23/2015, 11/27/2016, 11/27/2017, 11/14/2018   Influenza Split 11/13/2012   Influenza Whole 11/13/2009, 11/14/2011   Influenza-Unspecified 12/08/2013, 12/07/2014   PFIZER Comirnaty(Gray Top)Covid-19 Tri-Sucrose Vaccine 07/05/2020   PFIZER(Purple Top)SARS-COV-2 Vaccination 03/02/2019, 03/19/2019, 10/23/2019   Pneumococcal Conjugate-13 03/05/2017   Pneumococcal Polysaccharide-23 02/04/2016   RSV,unspecified 11/16/2022   Zoster, Live 03/14/2006   Pertinent  Health Maintenance Due  Topic Date Due   Influenza Vaccine  09/14/2023      12/28/2021    3:29 PM 12/30/2021    1:30 PM 02/24/2022    1:00 PM 08/30/2023  2:34 PM 10/16/2023    3:10 PM  Fall Risk  Falls in the past year? 0   0 0  Was there an injury with Fall? 0   0 0  Fall Risk Category Calculator 0   0 0  Fall Risk Category (Retired) Low       (RETIRED) Patient Fall Risk Level Low fall risk  Low fall risk  High fall risk     Patient at Risk for Falls Due to No Fall Risks   No Fall Risks Impaired balance/gait  Fall risk Follow up Falls prevention discussed    Falls evaluation completed Falls evaluation completed;Education provided     Data saved with a previous flowsheet row definition   Functional Status Survey:    Vitals:   11/20/23 1539  BP: (!) 111/43  Pulse: 72  Resp: 14  Temp: (!) 97.4 F (36.3 C)  SpO2: 93%  Weight: 167 lb 9.6 oz (76 kg)  Height: 6' 3 (1.905 m)   Body mass index is  20.95 kg/m. Physical Exam Vitals reviewed.  Constitutional:      General: He is not in acute distress.    Comments: Frail appearance  HENT:     Head: Normocephalic.  Eyes:     General:        Right eye: No discharge.        Left eye: No discharge.  Cardiovascular:     Rate and Rhythm: Normal rate and regular rhythm.     Pulses: Normal pulses.     Heart sounds: Normal heart sounds.  Pulmonary:     Effort: Pulmonary effort is normal.     Breath sounds: Normal breath sounds.  Abdominal:     General: Bowel sounds are normal.     Palpations: Abdomen is soft.  Musculoskeletal:     Cervical back: Neck supple.     Right lower leg: No edema.     Left lower leg: No edema.  Skin:    General: Skin is warm.     Capillary Refill: Capillary refill takes less than 2 seconds.     Findings: Lesion present.     Comments: Stage III PU to sacrum 3 cm x 1 cm x 0.5 cm, slough to wound bed, no odor  Neurological:     General: No focal deficit present.     Mental Status: He is alert and oriented to person, place, and time.     Gait: Gait abnormal.  Psychiatric:        Mood and Affect: Mood normal.     Labs reviewed: Recent Labs    09/06/23 1216 09/26/23 1149 10/01/23 1512 10/19/23 0000  NA 137 135 137 137  K 4.9 4.7 4.9 4.4  CL 102 101 101 101  CO2 28 26 27  28*  GLUCOSE 83 89 100*  --   BUN 13 23 14 19   CREATININE 1.03 1.82* 1.19 1.0  CALCIUM 8.7* 8.7* 8.6* 8.3*   Recent Labs    09/06/23 1216 09/26/23 1149 10/01/23 1512 10/19/23 0000  AST 22 20 22 20   ALT 13 15 14 20   ALKPHOS 132* 143* 154* 183*  BILITOT 0.6 1.1 0.5  --   PROT 6.8 7.8 7.0  --   ALBUMIN 3.5 3.5 3.5 2.8*   Recent Labs    08/31/23 1502 09/06/23 1216 09/26/23 1149 10/01/23 1512 10/19/23 0000  WBC 7.0 5.5 7.4 8.3 5.5  NEUTROABS 4.4 3.2  --  5.6  --   HGB 11.8*  11.2* 13.1 12.7* 11.8*  HCT 37.2* 35.9* 42.5 40.3 36*  MCV 89.4 90.2 91.2 91.0  --   PLT 282 237 253 274 250   Lab Results  Component  Value Date   TSH 0.676 10/01/2023   No results found for: HGBA1C Lab Results  Component Value Date   CHOL 141 03/14/2019   HDL 38.00 (L) 03/14/2019   LDLCALC 88 03/14/2019   LDLDIRECT 116.0 03/31/2013   TRIG 74.0 03/14/2019   CHOLHDL 4 03/14/2019    Significant Diagnostic Results in last 30 days:  NM PET (PSMA) SKULL TO MID THIGH Addendum Date: 10/30/2023 ADDENDUM REPORT: 10/30/2023 10:31 ADDENDUM: Additional finding: Radiotracer avid LEFT supraclavicular lymph node measuring 9 mm on image 58 is consistent with prostate cancer nodal metastasis. Electronically Signed   By: Jackquline Boxer M.D.   On: 10/30/2023 10:31   Result Date: 10/30/2023 CLINICAL DATA:  Elevated PSA. 89 year old male. History of papillary thyroid  carcinoma. EXAM: NUCLEAR MEDICINE PET SKULL BASE TO THIGH TECHNIQUE: 8.4 mCi Flotufolastat (Posluma ) was injected intravenously. Full-ring PET imaging was performed from the skull base to thigh after the radiotracer. CT data was obtained and used for attenuation correction and anatomic localization. COMPARISON:  CT 09/26/2023 FINDINGS: NECK No radiotracer activity in neck lymph nodes. Incidental CT finding: None. CHEST Bilateral pulmonary nodules and masses at the lung bases. Example mass at the LEFT lung base measuring 3.3 cm has mild radiotracer activity SUV max equal 6 4. Smaller nodule in the RIGHT middle lobe measuring 15 mm has relatively low radiotracer activity (SUV max equal 5.). Large RIGHT lower lobe mass measuring 5 cm with SUV max 5.5. Incidental CT finding: Coronary artery calcification and aortic atherosclerotic calcification. ABDOMEN/PELVIS Prostate: Focal intense radiotracer activity in the apex of the prostate consistent with prostate carcinoma (image 213). Foley catheter noted Lymph nodes: Intensely radiotracer avid small iliac nodes and larger periaortic retroperitoneal nodes. For example LEFT operator node SUV max equal 26 on image 193 measures 5 mm.  Retroperitoneal lymph node at the level of the renal veins LEFT of the aorta measuring 12 mm with SUV max equal 35 on image 155. Liver: No evidence of liver metastasis. Incidental CT finding: Large bilateral renal cysts. Multiple liver cysts. Atherosclerotic calcification of the aorta. Foley catheter within the bladder. Mild LEFT hydroureter. SKELETON Intensely radiotracer avid skeletal metastasis within the spine, pelvis, ribs as well as the upper and lower extremities. Lesions are relatively small and accompanied by sclerotic change on CT. For example lesion in the proximal RIGHT femur with SUV max equal 23 on image 218. Lesion in the LEFT iliac bone with SUV max equal 39. Lesion in the L5 with SUV max equal 28. Lesion in the head of the RIGHT humerus with SUV max equal 28. There are approximately 100 skeletal lesions. IMPRESSION: 1. Intense radiotracer activity in the apex of the prostate gland consistent with primary prostate adenocarcinoma. 2. Intensely radiotracer avid iliac and periaortic retroperitoneal nodal metastasis. 3. Extensive radiotracer avid skeletal metastasis involving the spine, pelvis, ribs, and upper and lower extremities. Lesions too numerous to count. 4. Bilateral lower lobe pulmonary nodules and masses. These lesions have relatively mild radiotracer activity. Findings most consistent with nonspecific activity associated with thyroid  cancer pulmonary metastasis. 5.  Aortic Atherosclerosis (ICD10-I70.0). Electronically Signed: By: Jackquline Boxer M.D. On: 10/30/2023 08:41    Assessment/Plan 1. Pressure injury of sacral region, stage 3 (HCC) (Primary) - followed by wound nurse - cont dakins dressing changes - cont air mattress -  discontinue tramadol  and start morphine    2. Prostate cancer metastatic to bone (HCC) - hospice referred> still deciding - on Orgovyx  - start morphine  and ativan prn for pain and anxiety  3. Adult failure to thrive - poor po intake - see above   4.  Paroxysmal A-fib (HCC) - does not want future INR checks> will discontinue - discontinue coumadin    5. Long term (current) use of anticoagulants - see above   Family/ staff Communication: plan discussed with patient and nurse  Labs/tests ordered:  none

## 2023-11-25 DIAGNOSIS — D6869 Other thrombophilia: Secondary | ICD-10-CM | POA: Diagnosis not present

## 2023-11-25 DIAGNOSIS — I951 Orthostatic hypotension: Secondary | ICD-10-CM | POA: Diagnosis not present

## 2023-11-25 DIAGNOSIS — C73 Malignant neoplasm of thyroid gland: Secondary | ICD-10-CM | POA: Diagnosis not present

## 2023-11-25 DIAGNOSIS — L8945 Pressure ulcer of contiguous site of back, buttock and hip, unstageable: Secondary | ICD-10-CM | POA: Diagnosis not present

## 2023-11-25 DIAGNOSIS — R55 Syncope and collapse: Secondary | ICD-10-CM | POA: Diagnosis not present

## 2023-11-25 DIAGNOSIS — C61 Malignant neoplasm of prostate: Secondary | ICD-10-CM | POA: Diagnosis not present

## 2023-11-25 DIAGNOSIS — E785 Hyperlipidemia, unspecified: Secondary | ICD-10-CM | POA: Diagnosis not present

## 2023-11-25 DIAGNOSIS — N401 Enlarged prostate with lower urinary tract symptoms: Secondary | ICD-10-CM | POA: Diagnosis not present

## 2023-11-25 DIAGNOSIS — I480 Paroxysmal atrial fibrillation: Secondary | ICD-10-CM | POA: Diagnosis not present

## 2023-11-25 DIAGNOSIS — R339 Retention of urine, unspecified: Secondary | ICD-10-CM | POA: Diagnosis not present

## 2023-11-25 DIAGNOSIS — C7951 Secondary malignant neoplasm of bone: Secondary | ICD-10-CM | POA: Diagnosis not present

## 2023-11-26 ENCOUNTER — Ambulatory Visit: Payer: Self-pay

## 2023-11-26 DIAGNOSIS — C73 Malignant neoplasm of thyroid gland: Secondary | ICD-10-CM | POA: Diagnosis not present

## 2023-11-26 DIAGNOSIS — C61 Malignant neoplasm of prostate: Secondary | ICD-10-CM | POA: Diagnosis not present

## 2023-11-26 DIAGNOSIS — N401 Enlarged prostate with lower urinary tract symptoms: Secondary | ICD-10-CM | POA: Diagnosis not present

## 2023-11-26 DIAGNOSIS — R339 Retention of urine, unspecified: Secondary | ICD-10-CM | POA: Diagnosis not present

## 2023-11-26 DIAGNOSIS — C7951 Secondary malignant neoplasm of bone: Secondary | ICD-10-CM | POA: Diagnosis not present

## 2023-11-26 DIAGNOSIS — I480 Paroxysmal atrial fibrillation: Secondary | ICD-10-CM | POA: Diagnosis not present

## 2023-11-26 NOTE — Progress Notes (Signed)
 Per review of chart, pt has been taken off of warfarin.  Resolving anticoagulation encounters.

## 2023-11-27 DIAGNOSIS — N401 Enlarged prostate with lower urinary tract symptoms: Secondary | ICD-10-CM | POA: Diagnosis not present

## 2023-11-27 DIAGNOSIS — R339 Retention of urine, unspecified: Secondary | ICD-10-CM | POA: Diagnosis not present

## 2023-11-27 DIAGNOSIS — C61 Malignant neoplasm of prostate: Secondary | ICD-10-CM | POA: Diagnosis not present

## 2023-11-27 DIAGNOSIS — C73 Malignant neoplasm of thyroid gland: Secondary | ICD-10-CM | POA: Diagnosis not present

## 2023-11-27 DIAGNOSIS — C7951 Secondary malignant neoplasm of bone: Secondary | ICD-10-CM | POA: Diagnosis not present

## 2023-11-27 DIAGNOSIS — I480 Paroxysmal atrial fibrillation: Secondary | ICD-10-CM | POA: Diagnosis not present

## 2023-11-28 DIAGNOSIS — R339 Retention of urine, unspecified: Secondary | ICD-10-CM | POA: Diagnosis not present

## 2023-11-28 DIAGNOSIS — C61 Malignant neoplasm of prostate: Secondary | ICD-10-CM | POA: Diagnosis not present

## 2023-11-28 DIAGNOSIS — I480 Paroxysmal atrial fibrillation: Secondary | ICD-10-CM | POA: Diagnosis not present

## 2023-11-28 DIAGNOSIS — C73 Malignant neoplasm of thyroid gland: Secondary | ICD-10-CM | POA: Diagnosis not present

## 2023-11-28 DIAGNOSIS — C7951 Secondary malignant neoplasm of bone: Secondary | ICD-10-CM | POA: Diagnosis not present

## 2023-11-28 DIAGNOSIS — N401 Enlarged prostate with lower urinary tract symptoms: Secondary | ICD-10-CM | POA: Diagnosis not present

## 2023-11-29 DIAGNOSIS — C7951 Secondary malignant neoplasm of bone: Secondary | ICD-10-CM | POA: Diagnosis not present

## 2023-11-29 DIAGNOSIS — C73 Malignant neoplasm of thyroid gland: Secondary | ICD-10-CM | POA: Diagnosis not present

## 2023-11-29 DIAGNOSIS — I480 Paroxysmal atrial fibrillation: Secondary | ICD-10-CM | POA: Diagnosis not present

## 2023-11-29 DIAGNOSIS — R339 Retention of urine, unspecified: Secondary | ICD-10-CM | POA: Diagnosis not present

## 2023-11-29 DIAGNOSIS — N401 Enlarged prostate with lower urinary tract symptoms: Secondary | ICD-10-CM | POA: Diagnosis not present

## 2023-11-29 DIAGNOSIS — C61 Malignant neoplasm of prostate: Secondary | ICD-10-CM | POA: Diagnosis not present

## 2023-11-30 DIAGNOSIS — C73 Malignant neoplasm of thyroid gland: Secondary | ICD-10-CM | POA: Diagnosis not present

## 2023-11-30 DIAGNOSIS — C7951 Secondary malignant neoplasm of bone: Secondary | ICD-10-CM | POA: Diagnosis not present

## 2023-11-30 DIAGNOSIS — N401 Enlarged prostate with lower urinary tract symptoms: Secondary | ICD-10-CM | POA: Diagnosis not present

## 2023-11-30 DIAGNOSIS — I480 Paroxysmal atrial fibrillation: Secondary | ICD-10-CM | POA: Diagnosis not present

## 2023-11-30 DIAGNOSIS — C61 Malignant neoplasm of prostate: Secondary | ICD-10-CM | POA: Diagnosis not present

## 2023-11-30 DIAGNOSIS — R339 Retention of urine, unspecified: Secondary | ICD-10-CM | POA: Diagnosis not present

## 2023-12-03 ENCOUNTER — Telehealth: Payer: Self-pay

## 2023-12-03 ENCOUNTER — Telehealth: Payer: Self-pay | Admitting: Internal Medicine

## 2023-12-03 NOTE — Telephone Encounter (Signed)
 Reason for CRM:Pat calling for Dr Charlanne, asking if she could complete the death certificate for patient    Clarence Hopkins [birth year]b May 28, 2030    Could Dr Charlanne complete this in the [facility] electronic system        [facility]    Address: [address] [place], [place], [place] [address]    Phone: [phone]

## 2023-12-03 NOTE — Telephone Encounter (Signed)
 Copied from CRM #[id number]. Topic: General - Deceased Patient >> 12-06-23  9:06 AM Robinson DEL wrote: Name of caller: Bruna   Date of death: December 03, 2023   Name of funeral home: [facility]  Phone number of funeral home: [phone]  Provider that needs to sign form: Dr. Garald  Timeline for signing: ASAP   -----------------------------------------------------------------------

## 2023-12-06 NOTE — Telephone Encounter (Signed)
 Dr. Charlanne has probably filled out his death certificate.  My name was removed from [facility] email regarding David's death certificate.  Thanks

## 2023-12-15 DEATH — deceased
